# Patient Record
Sex: Female | Born: 1935 | Race: White | Hispanic: No | Smoking: Former smoker
Health system: Southern US, Community
[De-identification: ages and names within clinical notes are randomized; demographics above are authoritative.]

## PROBLEM LIST (undated history)

## (undated) ENCOUNTER — Inpatient Hospital Stay: Admission: EM | Payer: Self-pay | Source: Home / Self Care

## (undated) DIAGNOSIS — Z9289 Personal history of other medical treatment: Secondary | ICD-10-CM

## (undated) DIAGNOSIS — R1030 Lower abdominal pain, unspecified: Secondary | ICD-10-CM

## (undated) DIAGNOSIS — R569 Unspecified convulsions: Secondary | ICD-10-CM

## (undated) DIAGNOSIS — M818 Other osteoporosis without current pathological fracture: Secondary | ICD-10-CM

## (undated) DIAGNOSIS — M1990 Unspecified osteoarthritis, unspecified site: Secondary | ICD-10-CM

## (undated) DIAGNOSIS — I639 Cerebral infarction, unspecified: Secondary | ICD-10-CM

## (undated) DIAGNOSIS — K644 Residual hemorrhoidal skin tags: Secondary | ICD-10-CM

## (undated) DIAGNOSIS — I82409 Acute embolism and thrombosis of unspecified deep veins of unspecified lower extremity: Secondary | ICD-10-CM

## (undated) DIAGNOSIS — R609 Edema, unspecified: Secondary | ICD-10-CM

## (undated) DIAGNOSIS — R600 Localized edema: Secondary | ICD-10-CM

## (undated) DIAGNOSIS — K5190 Ulcerative colitis, unspecified, without complications: Secondary | ICD-10-CM

## (undated) DIAGNOSIS — C50911 Malignant neoplasm of unspecified site of right female breast: Secondary | ICD-10-CM

## (undated) DIAGNOSIS — R195 Other fecal abnormalities: Secondary | ICD-10-CM

## (undated) DIAGNOSIS — C50912 Malignant neoplasm of unspecified site of left female breast: Secondary | ICD-10-CM

## (undated) HISTORY — DX: Ulcerative colitis, unspecified, without complications: K51.90

## (undated) HISTORY — DX: Unspecified convulsions: R56.9

## (undated) HISTORY — DX: Other osteoporosis without current pathological fracture: M81.8

## (undated) HISTORY — DX: Other fecal abnormalities: R19.5

## (undated) HISTORY — DX: Localized edema: R60.0

## (undated) HISTORY — PX: APPENDECTOMY: SHX54

## (undated) HISTORY — PX: TONSILLECTOMY: SUR1361

## (undated) HISTORY — PX: BREAST BIOPSY: SHX20

## (undated) HISTORY — DX: Residual hemorrhoidal skin tags: K64.4

## (undated) HISTORY — DX: Acute embolism and thrombosis of unspecified deep veins of unspecified lower extremity: I82.409

## (undated) HISTORY — DX: Lower abdominal pain, unspecified: R10.30

## (undated) HISTORY — PX: FRACTURE SURGERY: SHX138

## (undated) HISTORY — PX: CATARACT EXTRACTION W/ INTRAOCULAR LENS  IMPLANT, BILATERAL: SHX1307

## (undated) HISTORY — PX: WRIST FRACTURE SURGERY: SHX121

## (undated) HISTORY — DX: Edema, unspecified: R60.9

## (undated) HISTORY — PX: VENA CAVA FILTER PLACEMENT: SUR1032

---
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
## 1996-08-18 DIAGNOSIS — C50912 Malignant neoplasm of unspecified site of left female breast: Secondary | ICD-10-CM

## 1996-08-18 HISTORY — DX: Malignant neoplasm of unspecified site of left female breast: C50.912

## 1996-08-18 HISTORY — PX: BREAST LUMPECTOMY: SHX2

## 1997-12-26 ENCOUNTER — Encounter: Admission: RE | Admit: 1997-12-26 | Discharge: 1998-03-26 | Payer: Self-pay | Admitting: Family Medicine

## 2001-12-27 HISTORY — PX: COLONOSCOPY: SHX174

## 2002-09-27 ENCOUNTER — Encounter: Admission: RE | Admit: 2002-09-27 | Discharge: 2002-10-18 | Payer: Self-pay | Admitting: *Deleted

## 2003-02-23 ENCOUNTER — Encounter: Admission: RE | Admit: 2003-02-23 | Discharge: 2003-03-03 | Payer: Self-pay | Admitting: *Deleted

## 2005-03-31 ENCOUNTER — Ambulatory Visit: Payer: Self-pay | Admitting: Internal Medicine

## 2005-05-09 ENCOUNTER — Ambulatory Visit (HOSPITAL_COMMUNITY): Admission: RE | Admit: 2005-05-09 | Discharge: 2005-05-09 | Payer: Self-pay | Admitting: Internal Medicine

## 2005-05-09 ENCOUNTER — Ambulatory Visit: Payer: Self-pay | Admitting: Internal Medicine

## 2005-05-09 ENCOUNTER — Encounter (INDEPENDENT_AMBULATORY_CARE_PROVIDER_SITE_OTHER): Payer: Self-pay | Admitting: Internal Medicine

## 2005-05-09 HISTORY — PX: COLONOSCOPY: SHX174

## 2006-07-15 ENCOUNTER — Ambulatory Visit: Payer: Self-pay | Admitting: Internal Medicine

## 2006-09-17 ENCOUNTER — Ambulatory Visit: Payer: Self-pay | Admitting: Internal Medicine

## 2006-10-09 ENCOUNTER — Ambulatory Visit: Payer: Self-pay | Admitting: Internal Medicine

## 2007-08-19 DIAGNOSIS — C50911 Malignant neoplasm of unspecified site of right female breast: Secondary | ICD-10-CM

## 2007-08-19 HISTORY — DX: Malignant neoplasm of unspecified site of right female breast: C50.911

## 2007-08-19 HISTORY — PX: BREAST BIOPSY: SHX20

## 2007-08-19 HISTORY — PX: MASTECTOMY: SHX3

## 2008-04-13 ENCOUNTER — Encounter: Admission: RE | Admit: 2008-04-13 | Discharge: 2008-04-13 | Payer: Self-pay | Admitting: Hematology and Oncology

## 2008-10-06 HISTORY — PX: COLONOSCOPY: SHX174

## 2010-10-24 ENCOUNTER — Ambulatory Visit (INDEPENDENT_AMBULATORY_CARE_PROVIDER_SITE_OTHER): Payer: Medicare Other | Admitting: Internal Medicine

## 2010-10-24 DIAGNOSIS — K5190 Ulcerative colitis, unspecified, without complications: Secondary | ICD-10-CM

## 2010-10-24 LAB — CBC AND DIFFERENTIAL
HCT: 41 % (ref 36–46)
Hemoglobin: 13.6 g/dL (ref 12.0–16.0)
Platelets: 238 10*3/uL (ref 150–399)

## 2010-12-11 HISTORY — PX: OTHER SURGICAL HISTORY: SHX169

## 2011-01-03 NOTE — Op Note (Signed)
NAME:  Monique Day, Monique Day NO.:  [id number]   MEDICAL RECORD NO.:  [id number]          PATIENT TYPE:  AMB   LOCATION:  DAY                           FACILITY:  [facility]   PHYSICIAN:  Hildred Laser, M.D.    DATE OF BIRTH:  02-26-1936   DATE OF PROCEDURE:  05/09/2005  DATE OF DISCHARGE:                                 OPERATIVE REPORT   PROCEDURE:  Surveillance colonoscopy.   INDICATION:  Monique Day is a 75 year old Caucasian female with over 40-year  history of ulcerative colitis who remains in remission. She is here for  surveillance examination. The procedure risks were reviewed with the  patient, and informed consent was obtained.   PREMEDICATION:  Demerol 25 mg IV, Versed 3 mg IV.   FINDINGS:  Procedure performed in endoscopy suite. The patient's vital signs  and O2 saturation were monitored during the procedure and remained stable.  The patient was placed in left lateral position and rectal examination  performed. No abnormality noted on external or digital exam. Olympus  videoscope was placed in rectum and advanced under vision into sigmoid colon  and beyond. There was extensive scarring involving the sigmoid colon and  less so proximally. There was noncritical narrowing with few tiny  pseudopolyps at mid sigmoid colon. This was felt to be noncritical. Few tiny  polyps which appeared to be pseudopolyps. Preparation was excellent. Scope  was passed to cecum which was identified by appendiceal orifice and  ileocecal valve. Pictures taken for the record. As the scope was withdrawn,  colonic mucosa was examined for the second time, and multiple biopsies were  taken from the right colon, transverse colon, left colon (descending and  sigmoid) and rectum. No mass or ulceration was noted. Scope was retroflexed  to examine anorectal junction, and prominent papilla was noted. Endoscope  was straightened and withdrawn. The patient tolerated the procedure well.   FINAL  DIAGNOSIS:  1.  Ulcerative colitis remains in remission. Extensive scarring noted.  2.  Noncritical narrowing at midsigmoid colon.  3.  Prominent anal papilla.   RECOMMENDATIONS:  She will resume her usual medications including Coumadin  today. I will be contacting the patient with biopsy results. Presuming  biopsies are negative for dysplasia, she will return for repeat exam in  three years from now.      Hildred Laser, M.D.  Electronically Signed     NR/MEDQ  D:  05/09/2005  T:  05/09/2005  Job:  [id number]   cc:   Octavio Graves  Fax: [phone]

## 2011-02-21 ENCOUNTER — Ambulatory Visit (HOSPITAL_COMMUNITY)
Admission: RE | Admit: 2011-02-21 | Discharge: 2011-02-21 | Disposition: A | Discharge status: Home / Self Care | Payer: Medicare Other | Source: Ambulatory Visit | Attending: Internal Medicine | Admitting: Internal Medicine

## 2011-02-21 ENCOUNTER — Other Ambulatory Visit (INDEPENDENT_AMBULATORY_CARE_PROVIDER_SITE_OTHER): Payer: Self-pay | Admitting: Internal Medicine

## 2011-02-21 ENCOUNTER — Encounter (HOSPITAL_BASED_OUTPATIENT_CLINIC_OR_DEPARTMENT_OTHER): Payer: Medicare Other | Admitting: Internal Medicine

## 2011-02-21 DIAGNOSIS — K5120 Ulcerative (chronic) proctitis without complications: Secondary | ICD-10-CM

## 2011-02-21 DIAGNOSIS — K5190 Ulcerative colitis, unspecified, without complications: Secondary | ICD-10-CM

## 2011-02-21 DIAGNOSIS — K921 Melena: Secondary | ICD-10-CM | POA: Insufficient documentation

## 2011-02-21 DIAGNOSIS — K625 Hemorrhage of anus and rectum: Secondary | ICD-10-CM

## 2011-02-21 DIAGNOSIS — R197 Diarrhea, unspecified: Secondary | ICD-10-CM | POA: Insufficient documentation

## 2011-02-21 DIAGNOSIS — Z7901 Long term (current) use of anticoagulants: Secondary | ICD-10-CM | POA: Insufficient documentation

## 2011-02-21 HISTORY — PX: COLONOSCOPY: SHX174

## 2011-02-21 LAB — CLOSTRIDIUM DIFFICILE BY PCR: Toxigenic C. Difficile by PCR: NEGATIVE

## 2011-02-24 LAB — OVA AND PARASITE EXAMINATION: Ova and parasites: NONE SEEN

## 2011-02-25 LAB — STOOL CULTURE

## 2011-03-10 NOTE — Op Note (Signed)
NAME:  Monique Day, Monique Day NO.:  [id number]  MEDICAL RECORD NO.:  [id number]  LOCATION:  DAYP                          FACILITY:  [facility]  PHYSICIAN:  Hildred Laser, M.D.    DATE OF BIRTH:  1936-05-14  DATE OF PROCEDURE:  02/21/2011 DATE OF DISCHARGE:                              OPERATIVE REPORT   PROCEDURE:  Colonoscopy.  [name] is a 75 year old Caucasian female with 50-year history of ulcerative colitis, whose last colonoscopy was 2 years ago who has been maintained on mesalamine and has done well until few weeks ago when she developed bloody diarrhea.  She is now feeling better.  She states she has been under stress, but did not take any NSAIDs.  She is chronically anticoagulated which unfortunately increased his ferritin for bleeding. Procedure risks were reviewed with the patient and informed consent was obtained.  MEDICATIONS FOR CONSCIOUS SEDATION: 1. Demerol 50 mg IV. 2. Versed 5 mg IV in divided dose.  FINDINGS:  Procedure performed in endoscopy suite.  The patient's vital signs and O2 sats were monitored during the procedure and remained stable.  The patient was placed in left lateral recumbent position and rectal examination performed.  No abnormality noted on external or digital exam.  Pentax videoscope was placed through rectum, where there was patchy edema, erythema and erosions.  The scope was passed into sigmoid colon, where these changes are more pronounced.  In some areas, there were circumferential involvement of mucosa with erosions or ulcers, edema, erythema and friability.  No obvious mass or stricture was noted.  Less pronounced changes were noted involving the descending transverse colon and hepatic flexure.  Mucosa of the cecum and ascending colon was spared.  Short segment of GI was also examined and was normal. On the way out, stool sample was taken and sent to the lab for C. diff by PCR cultures.  Biopsies were taken from hepatic  flexure, proximal descending colon, rectal mucosa and submitted in separate containers. While in the rectum, scope was retroflexed to examine anorectal junction and she had single small skin tag.  Endoscope was straightened and withdrawn.  Withdrawal time was 17 minutes.  The patient tolerated the procedure well.  FINAL DIAGNOSES: 1. Normal terminal ileum. 2. Active ulcerative colitis with post pronounced changes in the     sigmoid colon and sparing of ascending colon and cecum. 3. Biopsies taken from 3 different areas. 4. Stool sample also sent for routine studies.  RECOMMENDATIONS: 1. Standard instructions given.  She will resume her Coumadin later     today. 2. We will await for results of stool studies and biopsy before     further recommendations made.  If stool studies are negative and     biopsy shows typical changes of active UC, we will treat her with     prednisone for 4 weeks or so.          ______________________________ Hildred Laser, M.D.     NR/MEDQ  D:  02/21/2011  T:  02/21/2011  Job:  [id number]  cc:   Deeann Saint, M.D. Fax: [phone]  Octavio Graves, MD Fax: [phone]  Electronically Signed by Hildred Laser M.D. on 03/10/2011  12:11:49 AM

## 2011-03-13 ENCOUNTER — Other Ambulatory Visit (INDEPENDENT_AMBULATORY_CARE_PROVIDER_SITE_OTHER): Payer: Self-pay | Admitting: *Deleted

## 2011-03-13 MED ORDER — MESALAMINE 400 MG PO TBEC: DELAYED_RELEASE_TABLET | ORAL | Status: DC

## 2011-03-27 ENCOUNTER — Encounter (INDEPENDENT_AMBULATORY_CARE_PROVIDER_SITE_OTHER): Payer: Self-pay

## 2011-05-06 ENCOUNTER — Ambulatory Visit (INDEPENDENT_AMBULATORY_CARE_PROVIDER_SITE_OTHER): Payer: Medicare Other | Admitting: Internal Medicine

## 2011-05-06 ENCOUNTER — Encounter (INDEPENDENT_AMBULATORY_CARE_PROVIDER_SITE_OTHER): Payer: Self-pay | Admitting: Internal Medicine

## 2011-05-06 VITALS — BP 112/70 | HR 68 | Temp 97.0°F | Resp 12 | Ht 63.0 in | Wt 165.0 lb

## 2011-05-06 DIAGNOSIS — K5190 Ulcerative colitis, unspecified, without complications: Secondary | ICD-10-CM

## 2011-05-06 NOTE — Patient Instructions (Signed)
No changes made to Asacol today. Call if symptoms relapse.

## 2011-05-06 NOTE — Progress Notes (Signed)
Presenting complaint; followup for ulcerative colitis. Subjective; Monique Day is in for scheduled visit. She has over a 50 year history of ulcerative colitis. He is chronically entered coagulated for history of DVT. She developed rectal bleeding and diarrhea. She had colonoscopy in July which revealed active disease with patchy involvement. Biopsies revealed active colitis without dysplasia. Stool culture and O&P were negative. Her mesalamine dose was increased she was also treated with prednisone. She took last dose of prednisone 3 weeks ago and has not had any problems. She is having one formed stool daily. She says her stools have not been like this for least 2 years. She denies abdominal pain. She doesn't she had a bone density study in April and her score was better than it had been before. Current medications; Current Outpatient Prescriptions on File Prior to Visit  Medication Sig Dispense Refill  . Calcium Carbonate-Vit D-Min (CALCIUM 1200 PO) Take by mouth.        . mesalamine (ASACOL) 400 MG EC tablet TakeFive Tablets Twice Daily  300 tablet  2  . multivitamin (THERAGRAN) per tablet Take 1 tablet by mouth daily.        Marland Kitchen Specialty Vitamins Products (MAGNESIUM, AMINO ACID CHELATE,) 133 MG tablet Take 1 tablet by mouth 2 (two) times daily.        Marland Kitchen warfarin (COUMADIN) 5 MG tablet Take 5 mg by mouth as directed.        objective; BP 112/70  Pulse 68  Temp(Src) 97 F (36.1 C) (Oral)  Resp 12  Ht 5' 3"  (1.6 m)  Wt 165 lb (74.844 kg)  BMI 29.23 kg/m2 Conjunctiva is pink; sclerae nonicteric No neck masses or thyromegaly noted to Abdomen is full but soft and nontender without organomegaly or masses. No peripheral edema or clubbing noted Assessment; Chronic ulcerative colitis with recent relapse documented with a colonoscopy. She has been off prednisone for 3 weeks and doing well. Plan; Continue  Asacol at 4 g twice daily. Unless has problems she'll return for office visit in one  year. Next colonoscopy in July 2017.

## 2011-10-16 ENCOUNTER — Encounter (INDEPENDENT_AMBULATORY_CARE_PROVIDER_SITE_OTHER): Payer: Self-pay | Admitting: *Deleted

## 2011-11-05 ENCOUNTER — Ambulatory Visit (INDEPENDENT_AMBULATORY_CARE_PROVIDER_SITE_OTHER): Payer: Medicare Other | Admitting: Internal Medicine

## 2011-11-05 ENCOUNTER — Encounter (INDEPENDENT_AMBULATORY_CARE_PROVIDER_SITE_OTHER): Payer: Self-pay | Admitting: Internal Medicine

## 2011-11-05 VITALS — BP 92/52 | HR 72 | Temp 98.0°F | Ht 63.0 in | Wt 164.2 lb

## 2011-11-05 DIAGNOSIS — K5190 Ulcerative colitis, unspecified, without complications: Secondary | ICD-10-CM

## 2011-11-05 DIAGNOSIS — I82409 Acute embolism and thrombosis of unspecified deep veins of unspecified lower extremity: Secondary | ICD-10-CM | POA: Insufficient documentation

## 2011-11-05 DIAGNOSIS — K5120 Ulcerative (chronic) proctitis without complications: Secondary | ICD-10-CM

## 2011-11-05 NOTE — Progress Notes (Signed)
Subjective:     Patient ID: Monique Day, female   DOB: 05/13/1936, 76 y.o.   MRN: [id number]  HPIBetty is a 76 yr old female here today for f/u of her UC. She was last seen in September of 2012. She has over a 50 yr hx of UC. She is presently of warfarin for hx of DVT.  She had a colonoscopy in July 2012  which revealed active disease with patchy involvement.  Biopsies revealed active colitis with dysplasia. Stool culture and O and P were negative.  She tells me she is doing well.  She is having one BM a day. No rectal bleeding or melena. No abdominal pain. Appetite is good. No weight loss.  She is 100% feeling better. She is exercising.    Review of Systems see hpi Current Outpatient Prescriptions  Medication Sig Dispense Refill  . Calcium Carbonate-Vit D-Min (CALCIUM 1200 PO) Take by mouth.        . Cholecalciferol (VITAMIN D3) 2000 UNITS TABS Take by mouth. Take 1 every morning and 1 at night        . co-enzyme Q-10 30 MG capsule Take 100 mg by mouth daily.        . fish oil-omega-3 fatty acids 1000 MG capsule Take 1 g by mouth daily.        . mesalamine (ASACOL) 400 MG EC tablet TakeFive Tablets Twice Daily  300 tablet  2  . multivitamin (THERAGRAN) per tablet Take 1 tablet by mouth daily.        Marland Kitchen warfarin (COUMADIN) 5 MG tablet Take 5 mg by mouth as directed.       . Zoledronic Acid (ZOMETA IV) Inject into the vein. Patient is infused yearly       . Specialty Vitamins Products (MAGNESIUM, AMINO ACID CHELATE,) 133 MG tablet Take 1 tablet by mouth 2 (two) times daily.         Past Medical History  Diagnosis Date  . UC (ulcerative colitis)   . Lower abdominal pain   . Occult blood in stools   . Hemorrhoids, external   . HX: breast cancer   . DVT (deep venous thrombosis)   . Seizures    Past Surgical History  Procedure Date  . Breast lumpectomy   . Colonoscopy 02/21/2011  . Colonoscopy 10/06/2008  . Colonoscopy 12/27/01  . Colonoscopy 05/09/05  . Bone density 12/11/10    History   Social History  . Marital Status: Widowed    Spouse Name: N/A    Number of Children: N/A  . Years of Education: N/A   Occupational History  . Not on file.   Social History Main Topics  . Smoking status: Former Smoker    Types: Cigarettes    Quit date: 05/05/2001  . Smokeless tobacco: Never Used  . Alcohol Use: No  . Drug Use: No  . Sexually Active: Not on file   Other Topics Concern  . Not on file   Social History Narrative  . No narrative on file   Family Status  Relation Status Death Age  . Mother Deceased 56    external cancer  . Father Deceased 61   Allergies  Allergen Reactions  . Sulfa Antibiotics         Objective:   Physical Exam Filed Vitals:   11/05/11 1541  Height: 5' 3"  (1.6 m)  Weight: 164 lb 3.2 oz (74.481 kg)   Alert and oriented. Skin warm and dry. Oral mucosa is  moist.   . Sclera anicteric, conjunctivae is pink. Thyroid not enlarged. No cervical lymphadenopathy. Lungs clear. Heart regular rate and rhythm.  Abdomen is soft. Bowel sounds are positive. No hepatomegaly. No abdominal masses felt. No tenderness.  No edema to lower extremities. Patient is alert and oriented.     Assessment:    [place] which appears to be in remission     Plan:   OV in 1 yr. CBC and sed rate today.

## 2011-11-05 NOTE — Patient Instructions (Signed)
CBC and Sed rate. F/u one year

## 2011-11-06 LAB — CBC WITH DIFFERENTIAL/PLATELET
Basophils Absolute: 0 10*3/uL (ref 0.0–0.1)
Basophils Relative: 0 % (ref 0–1)
Eosinophils Absolute: 0.2 10*3/uL (ref 0.0–0.7)
Eosinophils Relative: 3 % (ref 0–5)
HCT: 41.6 % (ref 36.0–46.0)
Hemoglobin: 13.7 g/dL (ref 12.0–15.0)
Lymphocytes Relative: 45 % (ref 12–46)
Lymphs Abs: 2.5 10*3/uL (ref 0.7–4.0)
MCH: 30.8 pg (ref 26.0–34.0)
MCHC: 32.9 g/dL (ref 30.0–36.0)
MCV: 93.5 fL (ref 78.0–100.0)
Monocytes Absolute: 0.4 10*3/uL (ref 0.1–1.0)
Monocytes Relative: 8 % (ref 3–12)
Neutro Abs: 2.4 10*3/uL (ref 1.7–7.7)
Neutrophils Relative %: 44 % (ref 43–77)
Platelets: 202 10*3/uL (ref 150–400)
RBC: 4.45 MIL/uL (ref 3.87–5.11)
RDW: 13 % (ref 11.5–15.5)
WBC: 5.4 10*3/uL (ref 4.0–10.5)

## 2011-11-06 LAB — SEDIMENTATION RATE: Sed Rate: 4 mm/hr (ref 0–22)

## 2012-01-08 ENCOUNTER — Encounter: Payer: Medicare Other | Admitting: Hematology and Oncology

## 2012-01-08 DIAGNOSIS — Z8632 Personal history of gestational diabetes: Secondary | ICD-10-CM

## 2012-01-08 DIAGNOSIS — C50919 Malignant neoplasm of unspecified site of unspecified female breast: Secondary | ICD-10-CM

## 2012-01-08 DIAGNOSIS — K5190 Ulcerative colitis, unspecified, without complications: Secondary | ICD-10-CM

## 2012-01-08 DIAGNOSIS — M810 Age-related osteoporosis without current pathological fracture: Secondary | ICD-10-CM

## 2012-01-15 DIAGNOSIS — M949 Disorder of cartilage, unspecified: Secondary | ICD-10-CM

## 2012-01-15 DIAGNOSIS — Z86718 Personal history of other venous thrombosis and embolism: Secondary | ICD-10-CM

## 2012-01-15 DIAGNOSIS — Z7901 Long term (current) use of anticoagulants: Secondary | ICD-10-CM

## 2012-01-15 DIAGNOSIS — C50919 Malignant neoplasm of unspecified site of unspecified female breast: Secondary | ICD-10-CM

## 2012-01-15 DIAGNOSIS — M899 Disorder of bone, unspecified: Secondary | ICD-10-CM

## 2012-04-22 ENCOUNTER — Telehealth (INDEPENDENT_AMBULATORY_CARE_PROVIDER_SITE_OTHER): Payer: Self-pay | Admitting: Internal Medicine

## 2012-04-22 NOTE — Telephone Encounter (Signed)
C/o lower abdominal pain, flecks of blood in her stools. Thinks she is having a UC flare.  Prednisone 42m x 1 week, then 162mx 1 week, then 1049m 1 week, then 5mg70m1 week, then OV.   DonnButch Pennyeds OV in 1 month.   [phone]

## 2012-04-22 NOTE — Telephone Encounter (Signed)
Apt has been scheduled for 06/08/12 at 10:45 am with Deberah Castle, NP.

## 2012-06-07 ENCOUNTER — Inpatient Hospital Stay (HOSPITAL_COMMUNITY)
Admission: EM | Admit: 2012-06-07 | Discharge: 2012-06-10 | DRG: 386 | Disposition: A | Discharge status: Home / Self Care | Payer: Medicare Other | Attending: Internal Medicine | Admitting: Internal Medicine

## 2012-06-07 ENCOUNTER — Encounter (HOSPITAL_COMMUNITY): Payer: Self-pay | Admitting: Emergency Medicine

## 2012-06-07 ENCOUNTER — Emergency Department (HOSPITAL_COMMUNITY): Payer: Medicare Other

## 2012-06-07 DIAGNOSIS — D62 Acute posthemorrhagic anemia: Secondary | ICD-10-CM

## 2012-06-07 DIAGNOSIS — Z882 Allergy status to sulfonamides status: Secondary | ICD-10-CM

## 2012-06-07 DIAGNOSIS — D6832 Hemorrhagic disorder due to extrinsic circulating anticoagulants: Secondary | ICD-10-CM

## 2012-06-07 DIAGNOSIS — Z86718 Personal history of other venous thrombosis and embolism: Secondary | ICD-10-CM

## 2012-06-07 DIAGNOSIS — Z7901 Long term (current) use of anticoagulants: Secondary | ICD-10-CM

## 2012-06-07 DIAGNOSIS — E669 Obesity, unspecified: Secondary | ICD-10-CM | POA: Diagnosis present

## 2012-06-07 DIAGNOSIS — K921 Melena: Secondary | ICD-10-CM

## 2012-06-07 DIAGNOSIS — Z9010 Acquired absence of unspecified breast and nipple: Secondary | ICD-10-CM

## 2012-06-07 DIAGNOSIS — E878 Other disorders of electrolyte and fluid balance, not elsewhere classified: Secondary | ICD-10-CM

## 2012-06-07 DIAGNOSIS — I82409 Acute embolism and thrombosis of unspecified deep veins of unspecified lower extremity: Secondary | ICD-10-CM

## 2012-06-07 DIAGNOSIS — Z6826 Body mass index (BMI) 26.0-26.9, adult: Secondary | ICD-10-CM

## 2012-06-07 DIAGNOSIS — Z853 Personal history of malignant neoplasm of breast: Secondary | ICD-10-CM

## 2012-06-07 DIAGNOSIS — T45515A Adverse effect of anticoagulants, initial encounter: Secondary | ICD-10-CM | POA: Diagnosis present

## 2012-06-07 DIAGNOSIS — K5289 Other specified noninfective gastroenteritis and colitis: Secondary | ICD-10-CM

## 2012-06-07 DIAGNOSIS — K5150 Left sided colitis without complications: Principal | ICD-10-CM | POA: Diagnosis present

## 2012-06-07 DIAGNOSIS — M899 Disorder of bone, unspecified: Secondary | ICD-10-CM | POA: Diagnosis present

## 2012-06-07 DIAGNOSIS — Z8673 Personal history of transient ischemic attack (TIA), and cerebral infarction without residual deficits: Secondary | ICD-10-CM

## 2012-06-07 DIAGNOSIS — Z23 Encounter for immunization: Secondary | ICD-10-CM

## 2012-06-07 DIAGNOSIS — Z87891 Personal history of nicotine dependence: Secondary | ICD-10-CM

## 2012-06-07 DIAGNOSIS — Z79899 Other long term (current) drug therapy: Secondary | ICD-10-CM

## 2012-06-07 DIAGNOSIS — K529 Noninfective gastroenteritis and colitis, unspecified: Secondary | ICD-10-CM

## 2012-06-07 DIAGNOSIS — K5120 Ulcerative (chronic) proctitis without complications: Secondary | ICD-10-CM

## 2012-06-07 DIAGNOSIS — Z9221 Personal history of antineoplastic chemotherapy: Secondary | ICD-10-CM

## 2012-06-07 DIAGNOSIS — IMO0002 Reserved for concepts with insufficient information to code with codable children: Secondary | ICD-10-CM

## 2012-06-07 DIAGNOSIS — D649 Anemia, unspecified: Secondary | ICD-10-CM

## 2012-06-07 DIAGNOSIS — E860 Dehydration: Secondary | ICD-10-CM | POA: Diagnosis present

## 2012-06-07 DIAGNOSIS — E871 Hypo-osmolality and hyponatremia: Secondary | ICD-10-CM

## 2012-06-07 DIAGNOSIS — R609 Edema, unspecified: Secondary | ICD-10-CM | POA: Diagnosis present

## 2012-06-07 HISTORY — DX: Cerebral infarction, unspecified: I63.9

## 2012-06-07 LAB — COMPREHENSIVE METABOLIC PANEL
ALT: 15 U/L (ref 0–35)
AST: 13 U/L (ref 0–37)
Albumin: 2.6 g/dL — ABNORMAL LOW (ref 3.5–5.2)
Alkaline Phosphatase: 34 U/L — ABNORMAL LOW (ref 39–117)
BUN: 19 mg/dL (ref 6–23)
CO2: 27 mEq/L (ref 19–32)
Calcium: 7.8 mg/dL — ABNORMAL LOW (ref 8.4–10.5)
Chloride: 95 mEq/L — ABNORMAL LOW (ref 96–112)
Creatinine, Ser: 0.87 mg/dL (ref 0.50–1.10)
GFR calc Af Amer: 74 mL/min — ABNORMAL LOW (ref >90)
GFR calc non Af Amer: 64 mL/min — ABNORMAL LOW (ref >90)
Glucose, Bld: 122 mg/dL — ABNORMAL HIGH (ref 70–99)
Potassium: 4.1 mEq/L (ref 3.5–5.1)
Sodium: 131 mEq/L — ABNORMAL LOW (ref 135–145)
Total Bilirubin: 0.4 mg/dL (ref 0.3–1.2)
Total Protein: 6.1 g/dL (ref 6.0–8.3)

## 2012-06-07 LAB — CBC WITH DIFFERENTIAL/PLATELET
Basophils Absolute: 0 10*3/uL (ref 0.0–0.1)
Basophils Relative: 0 % (ref 0–1)
Eosinophils Absolute: 0 10*3/uL (ref 0.0–0.7)
Eosinophils Relative: 0 % (ref 0–5)
HCT: 34.5 % — ABNORMAL LOW (ref 36.0–46.0)
Hemoglobin: 11.6 g/dL — ABNORMAL LOW (ref 12.0–15.0)
Lymphocytes Relative: 8 % — ABNORMAL LOW (ref 12–46)
Lymphs Abs: 0.6 10*3/uL — ABNORMAL LOW (ref 0.7–4.0)
MCH: 30.9 pg (ref 26.0–34.0)
MCHC: 33.6 g/dL (ref 30.0–36.0)
MCV: 91.8 fL (ref 78.0–100.0)
Monocytes Absolute: 1.1 10*3/uL — ABNORMAL HIGH (ref 0.1–1.0)
Monocytes Relative: 13 % — ABNORMAL HIGH (ref 3–12)
Neutro Abs: 6.7 10*3/uL (ref 1.7–7.7)
Neutrophils Relative %: 79 % — ABNORMAL HIGH (ref 43–77)
Platelets: 408 10*3/uL — ABNORMAL HIGH (ref 150–400)
RBC: 3.76 MIL/uL — ABNORMAL LOW (ref 3.87–5.11)
RDW: 13.1 % (ref 11.5–15.5)
WBC: 8.4 10*3/uL (ref 4.0–10.5)

## 2012-06-07 LAB — URINALYSIS, ROUTINE W REFLEX MICROSCOPIC
Glucose, UA: NEGATIVE mg/dL
Leukocytes, UA: NEGATIVE
Nitrite: NEGATIVE
Protein, ur: NEGATIVE mg/dL
Specific Gravity, Urine: >1.03 — ABNORMAL HIGH (ref 1.005–1.030)
Urobilinogen, UA: 0.2 mg/dL (ref 0.0–1.0)
pH: 5.5 (ref 5.0–8.0)

## 2012-06-07 LAB — PROTIME-INR
INR: 2.64 — ABNORMAL HIGH (ref 0.00–1.49)
Prothrombin Time: 26.9 seconds — ABNORMAL HIGH (ref 11.6–15.2)

## 2012-06-07 LAB — URINE MICROSCOPIC-ADD ON

## 2012-06-07 LAB — APTT: aPTT: 38 seconds — ABNORMAL HIGH (ref 24–37)

## 2012-06-07 MED ORDER — INFLUENZA VIRUS VACC SPLIT PF IM SUSP
0.5000 mL | INTRAMUSCULAR | Status: AC
  Filled 2012-06-07: qty 0.5

## 2012-06-07 MED ORDER — CIPROFLOXACIN IN D5W 400 MG/200ML IV SOLN
INTRAVENOUS | Status: AC
  Filled 2012-06-07: qty 400

## 2012-06-07 MED ORDER — HYDROCODONE-ACETAMINOPHEN 5-325 MG PO TABS
1.0000 | ORAL_TABLET | ORAL | Status: DC | PRN
  Administered 2012-06-10: 1 via ORAL
  Filled 2012-06-07: qty 1

## 2012-06-07 MED ORDER — ONDANSETRON HCL 4 MG/2ML IJ SOLN: 4.0000 mg | Freq: Three times a day (TID) | INTRAMUSCULAR | Status: DC | PRN

## 2012-06-07 MED ORDER — HYDROCODONE-ACETAMINOPHEN 5-325 MG PO TABS: 1.0000 | ORAL_TABLET | Freq: Four times a day (QID) | ORAL | Status: DC | PRN

## 2012-06-07 MED ORDER — SODIUM CHLORIDE 0.9 % IV SOLN: INTRAVENOUS | Status: DC

## 2012-06-07 MED ORDER — ONDANSETRON HCL 4 MG PO TABS: 4.0000 mg | ORAL_TABLET | Freq: Four times a day (QID) | ORAL | Status: DC | PRN

## 2012-06-07 MED ORDER — MESALAMINE 800 MG PO TBEC
1600.0000 mg | DELAYED_RELEASE_TABLET | Freq: Two times a day (BID) | ORAL | Status: DC
  Filled 2012-06-07 (×4): qty 2

## 2012-06-07 MED ORDER — METHYLPREDNISOLONE SODIUM SUCC 125 MG IJ SOLR
60.0000 mg | Freq: Two times a day (BID) | INTRAMUSCULAR | Status: DC
  Administered 2012-06-07 – 2012-06-09 (×4): 60 mg via INTRAVENOUS
  Filled 2012-06-07 (×4): qty 2

## 2012-06-07 MED ORDER — METRONIDAZOLE IN NACL 5-0.79 MG/ML-% IV SOLN
INTRAVENOUS | Status: AC
  Filled 2012-06-07: qty 200

## 2012-06-07 MED ORDER — POTASSIUM CHLORIDE IN NACL 20-0.9 MEQ/L-% IV SOLN
INTRAVENOUS | Status: DC
  Administered 2012-06-07 – 2012-06-10 (×4): via INTRAVENOUS

## 2012-06-07 MED ORDER — ACETAMINOPHEN 650 MG RE SUPP: 650.0000 mg | Freq: Four times a day (QID) | RECTAL | Status: DC | PRN

## 2012-06-07 MED ORDER — ONDANSETRON HCL 4 MG/2ML IJ SOLN
4.0000 mg | Freq: Once | INTRAMUSCULAR | Status: AC
  Administered 2012-06-07: 4 mg via INTRAVENOUS
  Filled 2012-06-07: qty 2

## 2012-06-07 MED ORDER — IOHEXOL 300 MG/ML  SOLN
100.0000 mL | Freq: Once | INTRAMUSCULAR | Status: AC | PRN
  Administered 2012-06-07: 100 mL via INTRAVENOUS

## 2012-06-07 MED ORDER — ONDANSETRON HCL 4 MG/2ML IJ SOLN: 4.0000 mg | Freq: Four times a day (QID) | INTRAMUSCULAR | Status: DC | PRN

## 2012-06-07 MED ORDER — SODIUM CHLORIDE 0.9 % IV BOLUS (SEPSIS)
700.0000 mL | Freq: Once | INTRAVENOUS | Status: AC
  Administered 2012-06-07: 700 mL via INTRAVENOUS

## 2012-06-07 MED ORDER — MESALAMINE 400 MG PO CPDR
DELAYED_RELEASE_CAPSULE | ORAL | Status: AC
  Filled 2012-06-07: qty 4

## 2012-06-07 MED ORDER — PANTOPRAZOLE SODIUM 40 MG PO TBEC
DELAYED_RELEASE_TABLET | ORAL | Status: AC
  Filled 2012-06-07: qty 1

## 2012-06-07 MED ORDER — SODIUM CHLORIDE 0.9 % IV SOLN
INTRAVENOUS | Status: DC
  Administered 2012-06-07 (×2): via INTRAVENOUS

## 2012-06-07 MED ORDER — WARFARIN - PHARMACIST DOSING INPATIENT: Freq: Every day | Status: DC

## 2012-06-07 MED ORDER — MORPHINE SULFATE 4 MG/ML IJ SOLN
4.0000 mg | Freq: Once | INTRAMUSCULAR | Status: AC
  Administered 2012-06-07: 4 mg via INTRAVENOUS
  Filled 2012-06-07 (×3): qty 1

## 2012-06-07 MED ORDER — PNEUMOCOCCAL VAC POLYVALENT 25 MCG/0.5ML IJ INJ
0.5000 mL | INJECTION | INTRAMUSCULAR | Status: AC
  Filled 2012-06-07: qty 0.5

## 2012-06-07 MED ORDER — ACETAMINOPHEN 325 MG PO TABS: 650.0000 mg | ORAL_TABLET | Freq: Four times a day (QID) | ORAL | Status: DC | PRN

## 2012-06-07 MED ORDER — MORPHINE SULFATE 2 MG/ML IJ SOLN: 2.0000 mg | INTRAMUSCULAR | Status: DC | PRN

## 2012-06-07 MED ORDER — WARFARIN SODIUM 5 MG PO TABS
5.0000 mg | ORAL_TABLET | Freq: Once | ORAL | Status: AC
  Administered 2012-06-07: 5 mg via ORAL
  Filled 2012-06-07: qty 1

## 2012-06-07 MED ORDER — METRONIDAZOLE IN NACL 5-0.79 MG/ML-% IV SOLN
500.0000 mg | Freq: Three times a day (TID) | INTRAVENOUS | Status: DC
  Administered 2012-06-07 – 2012-06-09 (×6): 500 mg via INTRAVENOUS
  Filled 2012-06-07 (×9): qty 100

## 2012-06-07 MED ORDER — CIPROFLOXACIN IN D5W 400 MG/200ML IV SOLN
400.0000 mg | Freq: Two times a day (BID) | INTRAVENOUS | Status: DC
  Administered 2012-06-07 – 2012-06-09 (×4): 400 mg via INTRAVENOUS
  Filled 2012-06-07 (×6): qty 200

## 2012-06-07 NOTE — ED Notes (Signed)
Per EMS pt has had generalized weakness x10 days. Pt reports that she started to have an ulcerative colitis flare-up in September went to Dr. Laural Golden and was px prednisone and ever since she has felt weak and shaky.  Pt denies any n/v within the last few hours.Pt reports that she has had blood in her stool since September and was seen in an ED in [place] while visiting family and was dx as dehydrated and the EDP there doubled her dose of prednisone to help with on-going ulcerative colitis flare-up.

## 2012-06-07 NOTE — ED Notes (Signed)
Pt states that she does not want morphine for pain. Pt states she doesn't feel she needs any pain medication at this time. MD aware.

## 2012-06-07 NOTE — H&P (Signed)
[facility] History and Physical  Monique Day OEU:[id number] DOB: [birth year] DOA: 06/07/2012  Referring physician: Dr. Tomi Bamberger PCP: Monique Graves, DO  Specialists: GI: Dr. Laural Golden  Chief Complaint: bloody stools  HPI: Monique Day is a 76 y.o. female with a history of ulcerative colitis that was diagnosed at the age of 61. She reports having flares approximately once every 2 years. She has been maintained on Asacol and reports doing fairly well with this. Since beginning September she has noticed that she's been having blood in her stools as well as diarrhea. She was placed on prednisone which initially provided some improvement in her symptoms, but every time the prednisone was tapered her symptoms would start to return. She visited family in [place] and required a emergency room visit over there. At that time her prednisone was increased which again provided some temporary improvement in her symptoms, but as the prednisone was tapered her symptoms return. She describes having 3-4 bowel movements during the day as well as 5-6 bowel movements at night. These are bloody bowel movements that are small in volume. She has had lower abdominal pain which is crampy. She has had nausea but no vomiting, mostly dry heaves. She has felt chills this morning but denies any fevers. She was evaluated in the emergency room with a CT scan which indicated an acute colitis. The patient has been referred for admission.  Review of Systems: Pertinent positives as per history of present illness, otherwise negative  Past Medical History  Diagnosis Date  . UC (ulcerative colitis)   . Lower abdominal pain   . Occult blood in stools   . Hemorrhoids, external   . HX: breast cancer   . DVT (deep venous thrombosis)   . Seizures   . Stroke    Past Surgical History  Procedure Date  . Breast lumpectomy   . Colonoscopy 02/21/2011  . Colonoscopy 10/06/2008  . Colonoscopy 12/27/01  . Colonoscopy 05/09/05    . Bone density 12/11/10   Social History:  reports that she quit smoking about 11 years ago. Her smoking use included Cigarettes. She has never used smokeless tobacco. She reports that she does not drink alcohol or use illicit drugs. She is independent with ADLs, lives at home  Allergies  Allergen Reactions  . Sulfa Antibiotics Other (See Comments)    Extreme Weakness    Family History  Problem Relation Age of Onset  . Prostate cancer Father    no inflammatory bowel disease in the family  Prior to Admission medications   Medication Sig Start Date End Date Taking? Authorizing Provider  Calcium Carb-Cholecalciferol (CALCIUM 1000 + D PO) Take 1-2 tablets by mouth daily. Takes one tablet in the morning and two tablets in the evening.   Yes Historical Provider, MD  Cholecalciferol (VITAMIN D3) 2000 UNITS TABS Take 4,000 Units by mouth daily. Take 1 every morning and 1 at night    Yes Historical Provider, MD  Coenzyme Q10 (CO Q 10) 100 MG CAPS Take 1 capsule by mouth daily.   Yes Historical Provider, MD  denosumab (PROLIA) 60 MG/ML SOLN injection Inject 60 mg into the skin every 6 (six) months. Administer in upper arm, thigh, or abdomen   Yes Historical Provider, MD  Mesalamine (ASACOL HD) 800 MG TBEC Take 1,600 mg by mouth 2 (two) times daily.   Yes Historical Provider, MD  Multiple Vitamin (MULTIVITAMIN WITH MINERALS) TABS Take 1 tablet by mouth daily.   Yes Historical Provider, MD  OMEGA  3 1200 MG CAPS Take 1 capsule by mouth daily.   Yes Historical Provider, MD  predniSONE (DELTASONE) 5 MG tablet Take 5 mg by mouth daily. Takes 4 tablets twice daily for 4 days, then take 3 tablets twice daily for 4 days, take 2 tablets twice daily for 4 days, take 1 tablet twice daily for 4 days, lastly take 4 tablets by mouth daily for 4 days.   Yes Historical Provider, MD  warfarin (COUMADIN) 5 MG tablet Take 2.5-5 mg by mouth as directed. Takes 5 mg on Mon, Wed, and Fri. All other days takes 2.5 mg    Yes Historical Provider, MD   Physical Exam: Filed Vitals:   06/07/12 1218 06/07/12 1411 06/07/12 1520 06/07/12 1643  BP: 111/60 86/49 111/64 103/66  Pulse: 81 85 87 91  Temp: 98.6 F (37 C) 98 F (36.7 C)  98.4 F (36.9 C)  TempSrc: Oral Oral  Oral  Resp: 14  16 18   Height:    5' 3"  (1.6 m)  Weight:    68.947 kg (152 lb)  SpO2: 97% 99% 100% 100%     General:  No acute distress  Eyes: Pupils are equal round react to light and accommodation  ENT: Mucous membranes are dry  Neck: Supple  Cardiovascular: S1, S2, regular rate and rhythm  Respiratory: Clear to auscultation bilaterally  Abdomen: Soft, mildly tender in the lower abdomen, bowel sounds are active  Skin: Normal  Musculoskeletal: Deferred  Psychiatric: Normal affect, cooperative with exam  Neurologic: Grossly intact, nonfocal  Labs on Admission:  Basic Metabolic Panel:  Lab 38/10/17 0924  NA 131*  K 4.1  CL 95*  CO2 27  GLUCOSE 122*  BUN 19  CREATININE 0.87  CALCIUM 7.8*  MG --  PHOS --   Liver Function Tests:  Lab 06/07/12 0924  AST 13  ALT 15  ALKPHOS 34*  BILITOT 0.4  PROT 6.1  ALBUMIN 2.6*   No results found for this basename: LIPASE:5,AMYLASE:5 in the last 168 hours No results found for this basename: AMMONIA:5 in the last 168 hours CBC:  Lab 06/07/12 0924  WBC 8.4  NEUTROABS 6.7  HGB 11.6*  HCT 34.5*  MCV 91.8  PLT 408*   Cardiac Enzymes: No results found for this basename: CKTOTAL:5,CKMB:5,CKMBINDEX:5,TROPONINI:5 in the last 168 hours  BNP (last 3 results) No results found for this basename: PROBNP:3 in the last 8760 hours CBG: No results found for this basename: GLUCAP:5 in the last 168 hours  Radiological Exams on Admission: Ct Abdomen Pelvis W Contrast  06/07/2012  *RADIOLOGY REPORT*  Clinical Data: Left lower quadrant pain.  Rectal bleeding.  Nausea. The ulcer colitis.  Personal history of bilateral breast carcinoma.  CT ABDOMEN AND PELVIS WITH CONTRAST   Technique:  Multidetector CT imaging of the abdomen and pelvis was performed following the standard protocol during bolus administration of intravenous contrast.  Contrast: 155m OMNIPAQUE IOHEXOL 300 MG/ML  SOLN  Comparison: None.  Findings: Mild colonic wall thickening and loss of normal haustral fold pattern is seen involving the splenic flexure, descending, and rectosigmoid colon.  This is consistent with the patient's history of ulcerative colitis.  There is no evidence of terminal ileum or other small bowel involvement.  No evidence of bowel obstruction. No evidence of abscess or free fluid.  Uterus and adnexa are unremarkable in appearance.  The abdominal parenchymal organs are normal in appearance.  Gallbladder is unremarkable.  No soft tissue masses or lymphadenopathy identified within the abdomen  or pelvis.  IMPRESSION:  1.  Mild colitis involving the splenic flexure, descending, and rectosigmoid colon, consistent with known history of ulcerative colitis. 2.  No evidence of complication or other significant abnormality.   Original Report Authenticated By: Marlaine Hind, M.D.      Assessment/Plan Active Problems:  UC (ulcerative colitis confined to rectum)  DVT (deep venous thrombosis)  Hyponatremia  Dehydration  Anemia   1. Ulcerative colitis flare. Patient was placed on intravenous steroids. We will also start her on intravenous antibiotics, check stool C. difficile, stool culture. We will ask for gastroenterology consultation with Dr. Laural Golden. We will continue her Asacol and the remainder of her outpatient medications. He'll be kept on clear liquids for now this can be advanced as tolerated. 2. History DVTs. Patient has been maintained on Coumadin chronically. We'll continue the same pharmacy to adjust. Her hemoglobin appears to be stable despite reports of bleeding. 3. Dehydration. Patient will receive IV fluids, monitor clinically  Code Status: Full code Family Communication: Discussed  with patient at the bedside Disposition Plan: Discharge home once medically improved  Time spent: 55 minutes  [facility] Pager [phone]  If 7PM-7AM, please contact night-coverage [email or web address] Password [facility] 06/07/2012, 6:15 PM

## 2012-06-07 NOTE — ED Provider Notes (Signed)
History    This chart was scribed for [name], MD, MD by Rhae Lerner. The patient was seen in room APA05 and the patient's care was started at 8:51AM.   CSN: [id number]  Arrival date & time 06/07/12  3818      Chief Complaint  Patient presents with  . Weakness  . Chills    (Consider location/radiation/quality/duration/timing/severity/associated sxs/prior treatment) Patient is a 76 y.o. female presenting with weakness. The history is provided by the patient. No language interpreter was used.  Weakness Primary symptoms do not include nausea or vomiting.  Additional symptoms include weakness.   Monique Day is a 76 y.o. female who presents to the Emergency Department BIB EMS complaining of constant, moderate chills onset this 3-6AM today. Pt reports having ulcerative colitis (diagnosed at 76 y.o) flare up that is has been constant since Sept. causing constant, moderate abdominal pain and watery/mucus diarrhea and blood in stool. She states that she has bowel movement constantly throughout the night about hourly. She reports that when she takes prednisone with minor relief (taken since 1.5 months). Pt reports that her prednisone was doubled from 20 to 40 10 days ago by EDP in [place], but is on downward taper now. Denies sore throat, rhinorrhea and cough. She has nausea and dry heaves, but no vomiting. Reports chills the prior two days without fever, but this am the chills were intense.   Pt reports that she has hx DVT, the she had chills when diagnosed with DVT. Denies pain or swelling in bilateral lower legs. States she did have some pain in her prox lateral left thigh yesterday but not today. Her INR in [place] was 2.6. Pt reports increased urination. She states that she feels weak due to diarrhea. Pt reports that she takes coumadin due to DVT.   PCP is Dr.  Ferd Hibbs in [place] is Dr. Laural Golden appointment tomorrow  Past Medical History  Diagnosis Date  . UC (ulcerative  colitis)   . Lower abdominal pain   . Occult blood in stools   . Hemorrhoids, external   . HX: breast cancer   . DVT (deep venous thrombosis)   . Seizures     Past Surgical History  Procedure Date  . Breast lumpectomy   . Colonoscopy 02/21/2011  . Colonoscopy 10/06/2008  . Colonoscopy 12/27/01  . Colonoscopy 05/09/05  . Bone density 12/11/10    Family History  Problem Relation Age of Onset  . Prostate cancer Father     History  Substance Use Topics  . Smoking status: Former Smoker    Types: Cigarettes    Quit date: 05/05/2001  . Smokeless tobacco: Never Used  . Alcohol Use: No  Lives at home  OB History    Grav Para Term Preterm Abortions TAB SAB Ect Mult Living                  Review of Systems  Constitutional: Positive for chills.  Gastrointestinal: Positive for abdominal pain, diarrhea and blood in stool. Negative for nausea and vomiting.  Neurological: Positive for weakness.  All other systems reviewed and are negative.    Allergies  Sulfa antibiotics  Home Medications   Current Outpatient Rx  Name Route Sig Dispense Refill  . CALCIUM 1200 PO Oral Take by mouth.      Marland Kitchen VITAMIN D3 2000 UNITS PO TABS Oral Take by mouth. Take 1 every morning and 1 at night      . COENZYME Q10  30 MG PO CAPS Oral Take 100 mg by mouth daily.      . OMEGA-3 FATTY ACIDS 1000 MG PO CAPS Oral Take 1 g by mouth daily.      Marland Kitchen MESALAMINE 400 MG PO TBEC  TakeFive Tablets Twice Daily 300 tablet 2  . MULTIVITAMINS PO TABS Oral Take 1 tablet by mouth daily.      . MG-PLUS PROTEIN 133 MG PO TABS Oral Take 1 tablet by mouth 2 (two) times daily.      . WARFARIN SODIUM 5 MG PO TABS Oral Take 5 mg by mouth as directed.     Marland Kitchen ZOMETA IV Intravenous Inject into the vein. Patient is infused yearly       BP 105/84  Pulse 115  Temp 98.6 F (37 C) (Oral)  Resp 20  SpO2 97%  Vital signs normal except tachycardia   Physical Exam  Nursing note and vitals reviewed. Constitutional:  She is oriented to person, place, and time. She appears well-developed and well-nourished.  Non-toxic appearance. She does not appear ill. No distress.       Pt appears pale   HENT:  Head: Normocephalic and atraumatic.  Right Ear: External ear normal.  Left Ear: External ear normal.  Nose: Nose normal. No mucosal edema or rhinorrhea.  Mouth/Throat: Mucous membranes are normal. No dental abscesses or uvula swelling.       Dry tongue, dry cracked lips  Eyes: Conjunctivae normal and EOM are normal. Pupils are equal, round, and reactive to light.  Neck: Normal range of motion and full passive range of motion without pain. Neck supple.  Cardiovascular: Normal rate, regular rhythm and normal heart sounds.  Exam reveals no gallop and no friction rub.   No murmur heard. Pulmonary/Chest: Effort normal and breath sounds normal. No respiratory distress. She has no wheezes. She has no rhonchi. She has no rales. She exhibits no tenderness and no crepitus.  Abdominal: Soft. Normal appearance and bowel sounds are normal. She exhibits no distension. There is tenderness in the left lower quadrant. There is no rebound and no guarding.    Musculoskeletal: Normal range of motion.        bilateral legs nontender  No swelling of bilateral legs   Neurological: She is alert and oriented to person, place, and time. She has normal strength. No cranial nerve deficit.  Skin: Skin is warm, dry and intact. No rash noted. No erythema. There is pallor.  Psychiatric: She has a normal mood and affect. Her speech is normal and behavior is normal. Her mood appears not anxious.    ED Course  Procedures (including critical care time) DIAGNOSTIC STUDIES: Oxygen Saturation is 97% on room air, normal by my interpretation.    COORDINATION OF CARE: 9:02 AM Discussed ED treatment with pt  9:02 AM Ordered:   Medications  0.9 %  sodium chloride infusion (not administered)  ondansetron (ZOFRAN) injection 4 mg (4 mg  Intravenous Given 06/07/12 0944)  sodium chloride 0.9 % bolus 700 mL (700 mL Intravenous New Bag/Given 06/07/12 0937)  iohexol (OMNIPAQUE) 300 MG/ML solution 100 mL (100 mL Intravenous Contrast Given 06/07/12 1105)   Pt refused IV morphine, wants oral meds.   11:41 AM Recheck: Discussed lab results and treatment course with pt. PT states she would prefer to be admitted. Pt will be admitted.    12:11 Dr Roderic Palau admit to med-surg, team 2   Results for orders placed during the hospital encounter of 06/07/12  CBC WITH DIFFERENTIAL  Component Value Range   WBC 8.4  4.0 - 10.5 K/uL   RBC 3.76 (*) 3.87 - 5.11 MIL/uL   Hemoglobin 11.6 (*) 12.0 - 15.0 g/dL   HCT 34.5 (*) 36.0 - 46.0 %   MCV 91.8  78.0 - 100.0 fL   MCH 30.9  26.0 - 34.0 pg   MCHC 33.6  30.0 - 36.0 g/dL   RDW 13.1  11.5 - 15.5 %   Platelets 408 (*) 150 - 400 K/uL   Neutrophils Relative 79 (*) 43 - 77 %   Neutro Abs 6.7  1.7 - 7.7 K/uL   Lymphocytes Relative 8 (*) 12 - 46 %   Lymphs Abs 0.6 (*) 0.7 - 4.0 K/uL   Monocytes Relative 13 (*) 3 - 12 %   Monocytes Absolute 1.1 (*) 0.1 - 1.0 K/uL   Eosinophils Relative 0  0 - 5 %   Eosinophils Absolute 0.0  0.0 - 0.7 K/uL   Basophils Relative 0  0 - 1 %   Basophils Absolute 0.0  0.0 - 0.1 K/uL  COMPREHENSIVE METABOLIC PANEL      Component Value Range   Sodium 131 (*) 135 - 145 mEq/L   Potassium 4.1  3.5 - 5.1 mEq/L   Chloride 95 (*) 96 - 112 mEq/L   CO2 27  19 - 32 mEq/L   Glucose, Bld 122 (*) 70 - 99 mg/dL   BUN 19  6 - 23 mg/dL   Creatinine, Ser 0.87  0.50 - 1.10 mg/dL   Calcium 7.8 (*) 8.4 - 10.5 mg/dL   Total Protein 6.1  6.0 - 8.3 g/dL   Albumin 2.6 (*) 3.5 - 5.2 g/dL   AST 13  0 - 37 U/L   ALT 15  0 - 35 U/L   Alkaline Phosphatase 34 (*) 39 - 117 U/L   Total Bilirubin 0.4  0.3 - 1.2 mg/dL   GFR calc non Af Amer 64 (*) >90 mL/min   GFR calc Af Amer 74 (*) >90 mL/min  URINALYSIS, ROUTINE W REFLEX MICROSCOPIC      Component Value Range   Color, Urine YELLOW   YELLOW   APPearance CLEAR  CLEAR   Specific Gravity, Urine >1.030 (*) 1.005 - 1.030   pH 5.5  5.0 - 8.0   Glucose, UA NEGATIVE  NEGATIVE mg/dL   Hgb urine dipstick TRACE (*) NEGATIVE   Bilirubin Urine SMALL (*) NEGATIVE   Ketones, ur TRACE (*) NEGATIVE mg/dL   Protein, ur NEGATIVE  NEGATIVE mg/dL   Urobilinogen, UA 0.2  0.0 - 1.0 mg/dL   Nitrite NEGATIVE  NEGATIVE   Leukocytes, UA NEGATIVE  NEGATIVE  CULTURE, BLOOD (ROUTINE X 2)      Component Value Range   Specimen Description Blood     Special Requests NONE     Culture NO GROWTH <24 HRS     Report Status PENDING    CULTURE, BLOOD (ROUTINE X 2)      Component Value Range   Specimen Description Blood     Special Requests NONE     Culture NO GROWTH <24 HRS     Report Status PENDING    APTT      Component Value Range   aPTT 38 (*) 24 - 37 seconds  PROTIME-INR      Component Value Range   Prothrombin Time 26.9 (*) 11.6 - 15.2 seconds   INR 2.64 (*) 0.00 - 1.49  URINE MICROSCOPIC-ADD ON  Component Value Range   WBC, UA 0-2  <3 WBC/hpf   RBC / HPF 0-2  <3 RBC/hpf   Bacteria, UA MANY (*) RARE    Laboratory interpretation all normal except for hyponatremia, low chloride, concentrated urine, mild anemia  Ct Abdomen Pelvis W Contrast  06/07/2012  *RADIOLOGY REPORT*  Clinical Data: Left lower quadrant pain.  Rectal bleeding.  Nausea. The ulcer colitis.  Personal history of bilateral breast carcinoma.  CT ABDOMEN AND PELVIS WITH CONTRAST  Technique:  Multidetector CT imaging of the abdomen and pelvis was performed following the standard protocol during bolus administration of intravenous contrast.  Contrast: 171m OMNIPAQUE IOHEXOL 300 MG/ML  SOLN  Comparison: None.  Findings: Mild colonic wall thickening and loss of normal haustral fold pattern is seen involving the splenic flexure, descending, and rectosigmoid colon.  This is consistent with the patient's history of ulcerative colitis.  There is no evidence of terminal ileum or  other small bowel involvement.  No evidence of bowel obstruction. No evidence of abscess or free fluid.  Uterus and adnexa are unremarkable in appearance.  The abdominal parenchymal organs are normal in appearance.  Gallbladder is unremarkable.  No soft tissue masses or lymphadenopathy identified within the abdomen or pelvis.  IMPRESSION:  1.  Mild colitis involving the splenic flexure, descending, and rectosigmoid colon, consistent with known history of ulcerative colitis. 2.  No evidence of complication or other significant abnormality.   Original Report Authenticated By: JMarlaine Hind M.D.      1. Colitis   2. Dehydration   3. Anemia   4. Hyponatremia   5. Chloride, decreased level    Plan admission    MDM   I personally performed the services described in this documentation, which was scribed in my presence. The recorded information has been reviewed and considered.  IRolland Porter MD, FAbram Sander   IJanice Norrie MD 06/07/12 [id number]

## 2012-06-07 NOTE — ED Notes (Signed)
Pt ambulated to restroom. 

## 2012-06-07 NOTE — Progress Notes (Signed)
ANTICOAGULATION CONSULT NOTE - Initial Consult  Pharmacy Consult for Warfarin Indication: DVT  Allergies  Allergen Reactions  . Sulfa Antibiotics Other (See Comments)    Extreme Weakness    Patient Measurements: Height: 5' 3"  (160 cm) Weight: 152 lb (68.947 kg) IBW/kg (Calculated) : 52.4    Vital Signs: Temp: 98.4 F (36.9 C) (10/21 1643) Temp src: Oral (10/21 1643) BP: 103/66 mmHg (10/21 1643) Pulse Rate: 91  (10/21 1643)  Labs:  Basename 06/07/12 0924  HGB 11.6*  HCT 34.5*  PLT 408*  APTT 38*  LABPROT 26.9*  INR 2.64*  HEPARINUNFRC --  CREATININE 0.87  CKTOTAL --  CKMB --  TROPONINI --    Estimated Creatinine Clearance: 52.8 ml/min (by C-G formula based on Cr of 0.87).   Medical History: Past Medical History  Diagnosis Date  . UC (ulcerative colitis)   . Lower abdominal pain   . Occult blood in stools   . Hemorrhoids, external   . HX: breast cancer   . DVT (deep venous thrombosis)   . Seizures   . Stroke     Medications:  Scheduled:    . ciprofloxacin  400 mg Intravenous Q12H  . influenza  inactive virus vaccine  0.5 mL Intramuscular Tomorrow-1000  . Mesalamine  1,600 mg Oral BID  . methylPREDNISolone (SOLU-MEDROL) injection  60 mg Intravenous Q12H  . metronidazole  500 mg Intravenous Q8H  . morphine  4 mg Intravenous Once  . ondansetron  4 mg Intravenous Once  . pantoprazole      . pneumococcal 23 valent vaccine  0.5 mL Intramuscular Tomorrow-1000  . sodium chloride  700 mL Intravenous Once  . warfarin  5 mg Oral Once  . Warfarin - Pharmacist Dosing Inpatient   Does not apply q1800    Assessment: INR therpeutic History of DVT Coumadin home regiment: 5 mg Monday, Wednesday, Friday, 2.5 mg other days  Goal of Therapy:  INR 2-3 Monitor platelets by anticoagulation protocol: Yes   Plan:  Coumadin 5 mg po tonight INR/PT daily CBC, monitor platelets  Abner Greenspan, Byrne Capek Bennett 06/07/2012,9:02 PM

## 2012-06-08 ENCOUNTER — Ambulatory Visit (INDEPENDENT_AMBULATORY_CARE_PROVIDER_SITE_OTHER): Payer: BC Managed Care – PPO | Admitting: Internal Medicine

## 2012-06-08 LAB — COMPREHENSIVE METABOLIC PANEL
ALT: 11 U/L (ref 0–35)
AST: 9 U/L (ref 0–37)
Albumin: 2.1 g/dL — ABNORMAL LOW (ref 3.5–5.2)
Alkaline Phosphatase: 29 U/L — ABNORMAL LOW (ref 39–117)
BUN: 12 mg/dL (ref 6–23)
CO2: 24 mEq/L (ref 19–32)
Calcium: 6.9 mg/dL — ABNORMAL LOW (ref 8.4–10.5)
Chloride: 102 mEq/L (ref 96–112)
Creatinine, Ser: 0.74 mg/dL (ref 0.50–1.10)
GFR calc Af Amer: >90 mL/min (ref >90)
GFR calc non Af Amer: 82 mL/min — ABNORMAL LOW (ref >90)
Glucose, Bld: 138 mg/dL — ABNORMAL HIGH (ref 70–99)
Potassium: 4 mEq/L (ref 3.5–5.1)
Sodium: 135 mEq/L (ref 135–145)
Total Bilirubin: 0.2 mg/dL — ABNORMAL LOW (ref 0.3–1.2)
Total Protein: 5.5 g/dL — ABNORMAL LOW (ref 6.0–8.3)

## 2012-06-08 LAB — PROTIME-INR
INR: 3.74 — ABNORMAL HIGH (ref 0.00–1.49)
Prothrombin Time: 34.8 seconds — ABNORMAL HIGH (ref 11.6–15.2)

## 2012-06-08 LAB — TSH: TSH: 0.446 u[IU]/mL (ref 0.350–4.500)

## 2012-06-08 LAB — CBC
HCT: 29.6 % — ABNORMAL LOW (ref 36.0–46.0)
Hemoglobin: 9.9 g/dL — ABNORMAL LOW (ref 12.0–15.0)
MCH: 31.1 pg (ref 26.0–34.0)
MCHC: 33.4 g/dL (ref 30.0–36.0)
MCV: 93.1 fL (ref 78.0–100.0)
Platelets: 312 10*3/uL (ref 150–400)
RBC: 3.18 MIL/uL — ABNORMAL LOW (ref 3.87–5.11)
RDW: 13.1 % (ref 11.5–15.5)
WBC: 5.2 10*3/uL (ref 4.0–10.5)

## 2012-06-08 LAB — URINE CULTURE
Colony Count: NO GROWTH
Culture: NO GROWTH

## 2012-06-08 MED ORDER — MESALAMINE 400 MG PO CPDR
1600.0000 mg | DELAYED_RELEASE_CAPSULE | Freq: Two times a day (BID) | ORAL | Status: DC
  Administered 2012-06-08 – 2012-06-10 (×5): 1600 mg via ORAL
  Filled 2012-06-08 (×15): qty 4

## 2012-06-08 NOTE — Progress Notes (Signed)
TPMT test not drawn today. Paperwork was given to Case [facility], [name]. Downtime lab paper with TBMP enzyme order and Dr. Olevia Perches signature at the request of the lab.  Night shift nurse is aware that this lab needs to be done in the am. And nurse tomorrow should request this paperwork back from case Mx. Monique Day

## 2012-06-08 NOTE — Progress Notes (Signed)
[place] for Warfarin Indication: DVT  Allergies  Allergen Reactions  . Sulfa Antibiotics Other (See Comments)    Extreme Weakness    Patient Measurements: Height: 5' 3"  (160 cm) Weight: 152 lb (68.947 kg) IBW/kg (Calculated) : 52.4    Vital Signs: Temp: 99.2 F (37.3 C) (10/22 0100) Temp src: Oral (10/22 0100)  Labs:  Basename 06/08/12 0533 06/07/12 0924  HGB 9.9* 11.6*  HCT 29.6* 34.5*  PLT 312 408*  APTT -- 38*  LABPROT 34.8* 26.9*  INR 3.74* 2.64*  HEPARINUNFRC -- --  CREATININE 0.74 0.87  CKTOTAL -- --  CKMB -- --  TROPONINI -- --    Estimated Creatinine Clearance: 57.5 ml/min (by C-G formula based on Cr of 0.74).   Medical History: Past Medical History  Diagnosis Date  . UC (ulcerative colitis)   . Lower abdominal pain   . Occult blood in stools   . Hemorrhoids, external   . HX: breast cancer   . DVT (deep venous thrombosis)   . Seizures   . Stroke     Medications:  Scheduled:     . ciprofloxacin  400 mg Intravenous Q12H  . influenza  inactive virus vaccine  0.5 mL Intramuscular Tomorrow-1000  . Mesalamine  1,600 mg Oral BID  . methylPREDNISolone (SOLU-MEDROL) injection  60 mg Intravenous Q12H  . metronidazole  500 mg Intravenous Q8H  . morphine  4 mg Intravenous Once  . pantoprazole      . pneumococcal 23 valent vaccine  0.5 mL Intramuscular Tomorrow-1000  . warfarin  5 mg Oral Once  . Warfarin - Pharmacist Dosing Inpatient   Does not apply q1800  . DISCONTD: Mesalamine  1,600 mg Oral BID    Assessment: INR supra-therapeutic today and rising quickly. History of DVT Coumadin home regimen: 5 mg Monday, Wednesday, Friday, 2.5 mg other days  Goal of Therapy:  INR 2-3 Monitor platelets by anticoagulation protocol: Yes   Plan: NO coumadin today (hold) INR daily CBC, monitor platelets  [place] Crane, Jadwiga Faidley A 06/08/2012,11:39 AM

## 2012-06-08 NOTE — Consult Note (Signed)
Reason for consultation; Flare up of ulcerative colitis. History of present illness; Patient is 76 year old Caucasian female who has chronic ulcerative colitis and is well known to me from previous evaluation call for office about 5 weeks ago with complaints of rectal bleeding and diarrhea and she was begun on prednisone 20 mg daily with instructions to taper her dose of 5 mg every week. She immediately noted improvement and went on a trip to [place]. While in [place] her diarrhea and rectal bleeding relapse when she dropped prednisone dose from 10 to 5 mg. While in [place] she was seen in emergency room and prednisone dose bump to 40 mg daily and once again chief felt better. However on tapering dose she got sick again with abdominal cramps rectal bleeding and diarrhea. Patient called our office last week and was to be seen today. However she developed worsening diarrhea and started to pass more blood and was up all night going to the bathroom. She therefore came to the emergency room and was hospitalized. She was begun on IV Solu-Medrol, IV antibiotics and clear liquids. Patient is chronically anticoagulated. Her INR this morning was 3.74 and a warfarin is on hold. Patient has experienced heaving when she has cramping but denies vomiting, fever or chills. No history of recent antibiotic use. On to this episode her appetite has been good and she has not lost any weight. She does not take any NSAIDs. Patient's last colonoscopy was in July 2012 revealing active disease in rectosigmoid area. Home medications; Asacol 1.6 g by mouth twice a day. Prednisone 5 mg by mouth daily. Warfarin 5 mg 3 times a week and 2.5 mg 4 times a week. Calcium with vitamin D 1 to 2 tablets daily. [place] D3 4000 units by mouth daily. CoQ10 100 mg by mouth daily. Denosumab 60 mg subcutaneous every 6 months. MVI with mineral 1 tablet by mouth daily. Omega-3 fish oil 1.2 g by mouth by mouth daily. Current medications; Ciprofloxacin 4  mg IV every 12 hours. Metronidazole 500 mg IV every 8 hours. Mesalamine 1.6 g by mouth twice a day. Mediastinal prednisone 60 mg IV every 12 hours. Past medical history; Chronic ulcerative colitis. Patient was diagnosed at age 89 and after initial treatment remained in remission for several years. She last had flareup over a year ago. Last colonoscopy was in July 2012 as above. She had laparotomy at the time of initial diagnosis but no details available. Left breast carcinoma treated with lumpectomy followed by chemoradiation in 1998. Right breast carcinoma treated with mastectomy in 2009. Surgery on right wrist for fracture in 2000. Recurrent DVT necessitating chronic anti-coagulation. 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 She was treated with Dilantin and then Keppra under these medications were stopped by a neurologist. Osteopenia. Allergies; Sulfa; Family history; Negative for IBD or CRC. Social history. She is retired. She's been married 3 times; first two  marriages  ended in divorce and her third husband is deceased. She does not smoke cigarettes or drink alcohol. Physical exam; BP 97/58  Pulse 93  Temp 99.2 F (37.3 C) (Oral)  Resp 18  Ht 5' 3"  (1.6 m)  Wt 152 lb (68.947 kg)  BMI 26.93 kg/m2  SpO2 96% Patient is alert and in no acute distress. Conjunctiva is somewhat pale. Sclerae nonicteric. Oropharyngeal mucosa is normal No neck masses or thyromegaly noted. Cardiac exam with regular rhythm normal S1 and S2. No murmur or gallop noted. Abdomen is symmetrical. Bowel sounds are normal.  On palpation soft abdomen with mild tenderness at LLQ but no organomegaly or masses noted. No peripheral edema or clubbing noted. Lab data; From admission. WBC 8.4, H&H 11.6 and 34.5, platelet count 408K. Serum sodium 131, potassium 4.1, chloride 95, CO2 27, glucose 122, BUN 19, creatinine oh 0.87, bilirubin oh 0.4, AP 34, AST 13, ALT 15 total protein 6.1 with albumin of  2.6 and calcium 7.8. INR 2.64. Lab data from this morning. INR 3.74 H&H 9.9 and 29.6. Abdominopelvic CT reviewed. Changes of colitis or colonic wall thickening involving distal half of the colon, splenic flexure down to rectosigmoid area. Assessment; Patient's symptoms are suggestive of relapse of ulcerative colitis. She does not appear to be acutely ill or toxic. She is passing more blood per rectum since she is anticoagulated. I agree with stool studies and empiric antibiotic therapy until these results are available. She is on reasonable dose of oral mesalamine and while there is room to go up on the dose I am concerned it may not work and therefore other treatment options should be explored. Treatment options include 6-MP or therapy with biologics. Both of these agents could be used for induction and maintenance. If she is able to tolerate it I would recommend 6-MP but first will need to check TPMT activity. Recommendations; Continue IV steroids for now. Continue antibiotics until stool studies completed. Serum TPMT assay. Diet advance to full liquids. Monitor H&H.

## 2012-06-08 NOTE — Progress Notes (Signed)
[facility]             Progress Note   Subjective: Patient is feeling better today. Abd pain improving.  Did have a small bowel movement with some blood.  Objective: Vital signs in last 24 hours: Temp:  [98 F (36.7 C)-100.4 F (38 C)] 98 F (36.7 C) (10/22 1452) Pulse Rate:  [86-93] 86  (10/22 1452) Resp:  [18-20] 20  (10/22 1452) BP: (97-109)/(58-65) 109/65 mmHg (10/22 1452) SpO2:  [96 %-97 %] 97 % (10/22 1452) Weight change:  Last BM Date: 06/07/12  Intake/Output from previous day: 10/21 0701 - 10/22 0700 In: 240 [P.O.:240] Out: 1 [Urine:1]     Physical Exam: General: Alert, awake, oriented x3, in no acute distress. HEENT: No bruits, no goiter. Heart: Regular rate and rhythm, without murmurs, rubs, gallops. Lungs: Clear to auscultation bilaterally. Abdomen: Soft, nontender, nondistended, positive bowel sounds. Extremities: No clubbing cyanosis or edema with positive pedal pulses. Neuro: Grossly intact, nonfocal.    Lab Results: Basic Metabolic Panel:  Basename 06/08/12 0533 06/07/12 0924  NA 135 131*  K 4.0 4.1  CL 102 95*  CO2 24 27  GLUCOSE 138* 122*  BUN 12 19  CREATININE 0.74 0.87  CALCIUM 6.9* 7.8*  MG -- --  PHOS -- --   Liver Function Tests:  Basename 06/08/12 0533 06/07/12 0924  AST 9 13  ALT 11 15  ALKPHOS 29* 34*  BILITOT 0.2* 0.4  PROT 5.5* 6.1  ALBUMIN 2.1* 2.6*   No results found for this basename: LIPASE:2,AMYLASE:2 in the last 72 hours No results found for this basename: AMMONIA:2 in the last 72 hours CBC:  Basename 06/08/12 0533 06/07/12 0924  WBC 5.2 8.4  NEUTROABS -- 6.7  HGB 9.9* 11.6*  HCT 29.6* 34.5*  MCV 93.1 91.8  PLT 312 408*   Cardiac Enzymes: No results found for this basename: CKTOTAL:3,CKMB:3,CKMBINDEX:3,TROPONINI:3 in the last 72 hours BNP: No results found for this basename: PROBNP:3 in the last 72 hours D-Dimer: No results found for this basename: DDIMER:2 in the last 72 hours CBG: No  results found for this basename: GLUCAP:6 in the last 72 hours Hemoglobin A1C: No results found for this basename: HGBA1C in the last 72 hours Fasting Lipid Panel: No results found for this basename: CHOL,HDL,LDLCALC,TRIG,CHOLHDL,LDLDIRECT in the last 72 hours Thyroid Function Tests:  Basename 06/07/12 1809  TSH 0.446  T4TOTAL --  FREET4 --  T3FREE --  THYROIDAB --   Anemia Panel: No results found for this basename: VITAMINB12,FOLATE,FERRITIN,TIBC,IRON,RETICCTPCT in the last 72 hours Coagulation:  Basename 06/08/12 0533 06/07/12 0924  LABPROT 34.8* 26.9*  INR 3.74* 2.64*   Urine Drug Screen: Drugs of Abuse  No results found for this basename: labopia, cocainscrnur, labbenz, amphetmu, thcu, labbarb    Alcohol Level: No results found for this basename: ETH:2 in the last 72 hours Urinalysis:  Basename 06/07/12 0916  COLORURINE YELLOW  LABSPEC >1.030*  PHURINE 5.5  GLUCOSEU NEGATIVE  HGBUR TRACE*  BILIRUBINUR SMALL*  KETONESUR TRACE*  PROTEINUR NEGATIVE  UROBILINOGEN 0.2  NITRITE NEGATIVE  LEUKOCYTESUR NEGATIVE    Recent Results (from the past 240 hour(s))  CULTURE, BLOOD (ROUTINE X 2)     Status: Normal (Preliminary result)   Collection Time   06/07/12  9:25 AM      Component Value Range Status Comment   Specimen Description BLOOD LEFT ANTECUBITAL DRAWN BY RN   Final    Special Requests     Final    Value: BOTTLES  DRAWN AEROBIC AND ANAEROBIC AEB=6CC ANA=4CC   Culture NO GROWTH 1 DAY   Final    Report Status PENDING   Incomplete   CULTURE, BLOOD (ROUTINE X 2)     Status: Normal (Preliminary result)   Collection Time   06/07/12  9:26 AM      Component Value Range Status Comment   Specimen Description BLOOD LEFT HAND   Final    Special Requests BOTTLES DRAWN AEROBIC ONLY 8CC   Final    Culture NO GROWTH 1 DAY   Final    Report Status PENDING   Incomplete     Studies/Results: Ct Abdomen Pelvis W Contrast  06/07/2012  *RADIOLOGY REPORT*  Clinical Data: Left  lower quadrant pain.  Rectal bleeding.  Nausea. The ulcer colitis.  Personal history of bilateral breast carcinoma.  CT ABDOMEN AND PELVIS WITH CONTRAST  Technique:  Multidetector CT imaging of the abdomen and pelvis was performed following the standard protocol during bolus administration of intravenous contrast.  Contrast: 12m OMNIPAQUE IOHEXOL 300 MG/ML  SOLN  Comparison: None.  Findings: Mild colonic wall thickening and loss of normal haustral fold pattern is seen involving the splenic flexure, descending, and rectosigmoid colon.  This is consistent with the patient's history of ulcerative colitis.  There is no evidence of terminal ileum or other small bowel involvement.  No evidence of bowel obstruction. No evidence of abscess or free fluid.  Uterus and adnexa are unremarkable in appearance.  The abdominal parenchymal organs are normal in appearance.  Gallbladder is unremarkable.  No soft tissue masses or lymphadenopathy identified within the abdomen or pelvis.  IMPRESSION:  1.  Mild colitis involving the splenic flexure, descending, and rectosigmoid colon, consistent with known history of ulcerative colitis. 2.  No evidence of complication or other significant abnormality.   Original Report Authenticated By: JMarlaine Hind M.D.     Medications: Scheduled Meds:   . ciprofloxacin  400 mg Intravenous Q12H  . influenza  inactive virus vaccine  0.5 mL Intramuscular Tomorrow-1000  . Mesalamine  1,600 mg Oral BID  . methylPREDNISolone (SOLU-MEDROL) injection  60 mg Intravenous Q12H  . metronidazole  500 mg Intravenous Q8H  . pantoprazole      . pneumococcal 23 valent vaccine  0.5 mL Intramuscular Tomorrow-1000  . Warfarin - Pharmacist Dosing Inpatient   Does not apply q1800  . DISCONTD: Mesalamine  1,600 mg Oral BID   Continuous Infusions:   . 0.9 % NaCl with KCl 20 mEq / L 75 mL/hr at 06/08/12 1101   PRN Meds:.acetaminophen, acetaminophen, HYDROcodone-acetaminophen, morphine injection,  ondansetron (ZOFRAN) IV, ondansetron  Assessment/Plan:  Active Problems:  UC (ulcerative colitis confined to rectum)  DVT (deep venous thrombosis)  Hyponatremia  Dehydration  Anemia  Plan:  1. Ulcerative colitis.  Appreciate GI assistance.  She is continued on steroids, asacol.  Considering starting 6MP.  She is antibiotics and stool studies are pending.  2. 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 She is on lifelong anticoagulation.  Will need to follow hemoglobins.  If it continues to decline, then may need to hold coumadin. Clinically, bleeding does not appear to be significant.  Will need to follow serial hemoglobins  3. Anemia.  Continue to follow hemoglobins.  Likely due to blood loss in stools from ulcerative colitis  4. Dehydration.  Improved with IV fluids.  5. Dispo.  Anticipate she will be ready for discharge in the next 1-2 days  Time spent coordinating care: 290ms  LOS: 1 day   MEMON,JEHANZEB [facility] Pager: [phone] 06/08/2012, 7:19 PM

## 2012-06-09 DIAGNOSIS — K5120 Ulcerative (chronic) proctitis without complications: Secondary | ICD-10-CM

## 2012-06-09 DIAGNOSIS — D62 Acute posthemorrhagic anemia: Secondary | ICD-10-CM | POA: Diagnosis present

## 2012-06-09 DIAGNOSIS — T45515A Adverse effect of anticoagulants, initial encounter: Secondary | ICD-10-CM | POA: Diagnosis present

## 2012-06-09 DIAGNOSIS — K921 Melena: Secondary | ICD-10-CM

## 2012-06-09 DIAGNOSIS — K625 Hemorrhage of anus and rectum: Secondary | ICD-10-CM

## 2012-06-09 DIAGNOSIS — K5190 Ulcerative colitis, unspecified, without complications: Secondary | ICD-10-CM

## 2012-06-09 DIAGNOSIS — D6832 Hemorrhagic disorder due to extrinsic circulating anticoagulants: Secondary | ICD-10-CM | POA: Diagnosis present

## 2012-06-09 HISTORY — DX: Hemorrhagic disorder due to extrinsic circulating anticoagulants: D68.32

## 2012-06-09 LAB — HEMOGLOBIN AND HEMATOCRIT, BLOOD
HCT: 29.6 % — ABNORMAL LOW (ref 36.0–46.0)
Hemoglobin: 9.9 g/dL — ABNORMAL LOW (ref 12.0–15.0)

## 2012-06-09 LAB — PROTIME-INR
INR: 5.91 (ref 0.00–1.49)
Prothrombin Time: 48.9 seconds — ABNORMAL HIGH (ref 11.6–15.2)

## 2012-06-09 MED ORDER — VITAMIN K1 10 MG/ML IJ SOLN
2.5000 mg | INTRAMUSCULAR | Status: AC
  Administered 2012-06-09: 2.5 mg via SUBCUTANEOUS
  Filled 2012-06-09: qty 1

## 2012-06-09 MED ORDER — METRONIDAZOLE 500 MG PO TABS
250.0000 mg | ORAL_TABLET | Freq: Three times a day (TID) | ORAL | Status: DC
  Administered 2012-06-09 – 2012-06-10 (×3): 250 mg via ORAL
  Filled 2012-06-09: qty 2
  Filled 2012-06-09 (×2): qty 1

## 2012-06-09 MED ORDER — CIPROFLOXACIN HCL 250 MG PO TABS
500.0000 mg | ORAL_TABLET | Freq: Two times a day (BID) | ORAL | Status: DC
  Administered 2012-06-09 – 2012-06-10 (×2): 500 mg via ORAL
  Filled 2012-06-09 (×3): qty 2

## 2012-06-09 MED ORDER — METHYLPREDNISOLONE SODIUM SUCC 40 MG IJ SOLR
40.0000 mg | Freq: Two times a day (BID) | INTRAMUSCULAR | Status: DC
  Administered 2012-06-09 – 2012-06-10 (×2): 40 mg via INTRAVENOUS
  Filled 2012-06-09 (×2): qty 1

## 2012-06-09 NOTE — Progress Notes (Signed)
INITIAL ADULT NUTRITION ASSESSMENT Date: 06/09/2012   Time: 3:21 PM Reason for Assessment: Malnutrition Screen  ASSESSMENT: Female 76 y.o.  Dx: Ulcerative Colitis   Past Medical History  Diagnosis Date  . UC (ulcerative colitis)   . Lower abdominal pain   . Occult blood in stools   . Hemorrhoids, external   . HX: breast cancer   . DVT (deep venous thrombosis)   . Seizures   . Stroke     Scheduled Meds:   . ciprofloxacin  500 mg Oral BID  . influenza  inactive virus vaccine  0.5 mL Intramuscular Tomorrow-1000  . Mesalamine  1,600 mg Oral BID  . methylPREDNISolone (SOLU-MEDROL) injection  40 mg Intravenous Q12H  . metroNIDAZOLE  250 mg Oral Q8H  . phytonadione  2.5 mg Subcutaneous NOW  . pneumococcal 23 valent vaccine  0.5 mL Intramuscular Tomorrow-1000  . Warfarin - Pharmacist Dosing Inpatient   Does not apply q1800  . DISCONTD: ciprofloxacin  400 mg Intravenous Q12H  . DISCONTD: methylPREDNISolone (SOLU-MEDROL) injection  60 mg Intravenous Q12H  . DISCONTD: metronidazole  500 mg Intravenous Q8H   Continuous Infusions:   . 0.9 % NaCl with KCl 20 mEq / L 30 mL/hr at 06/09/12 1233   PRN Meds:.acetaminophen, acetaminophen, HYDROcodone-acetaminophen, morphine injection, ondansetron (ZOFRAN) IV, ondansetron  Ht: 5' 3"  (160 cm)  Wt: 152 lb (68.947 kg)  Ideal Wt: 52.4 kg  % Ideal Wt:132%  Usual Wt:  Wt Readings from Last 10 Encounters:  06/07/12 152 lb (68.947 kg)  11/05/11 164 lb 3.2 oz (74.481 kg)  05/06/11 165 lb (74.844 kg)     Body mass index is 26.93 kg/(m^2). Overweight  Food/Nutrition Related Hx:Pt is very pleasant lady. Reports ulcerative colitis since college some ~32yr ago. Her diet is advanced now to Low Fiber and we talked about diet guidelines.She has good understanding of foods that are recommeded vs not recommended. Wt loss of 12#, 7% since March (7 months) which is not significant. Possible d/c home tomorrow per pt. Pt does not meet criteria for  malnutrition at this time.   CMP     Component Value Date/Time   NA 135 06/08/2012 0533   K 4.0 06/08/2012 0533   CL 102 06/08/2012 0533   CO2 24 06/08/2012 0533   GLUCOSE 138* 06/08/2012 0533   BUN 12 06/08/2012 0533   CREATININE 0.74 06/08/2012 0533   CALCIUM 6.9* 06/08/2012 0533   PROT 5.5* 06/08/2012 0533   ALBUMIN 2.1* 06/08/2012 0533   AST 9 06/08/2012 0533   ALT 11 06/08/2012 0533   ALKPHOS 29* 06/08/2012 0533   BILITOT 0.2* 06/08/2012 0533   GFRNONAA 82* 06/08/2012 0533   GFRAA >90 06/08/2012 0533   I/O last 3 completed shifts: In: 4302.5 [P.O.:600; I.V.:2602.5; IV Piggyback:1100] Out: 1 [Urine:1]   Diet Order: Fiber Restricted  Supplements/Tube Feeding:none at this time  IVF:    0.9 % NaCl with KCl 20 mEq / L Last Rate: 30 mL/hr at 06/09/12 1233    Estimated Nutritional Needs:   Kcal:1518-1725 kcal Protein:69-77 gr Fluid:1 ml/kcal  NUTRITION DIAGNOSIS: -Altered GI function (NI-1.4).  Status: Ongoing  RELATED TO: hematochezia   AS EVIDENCE BY: acute exacerbation of ulcerated colitis  MONITORING/EVALUATION(Goals): -Monitor for diet tolerance, po intake percentage, BM, wt changes and labs -Goal: Pt to meet >/= 90% of their estimated nutrition needs; not met  EDUCATION NEEDS: -Education needs addressed  INTERVENTION: Low Fiber diet per MD RD will follow for nutrition needs  Dietitian #[phone]  DOCUMENTATION CODES Per approved criteria  -Not Applicable    Frederik Schmidt 06/09/2012, 3:21 PM

## 2012-06-09 NOTE — Progress Notes (Signed)
Patient had a TPMT enzyme test to be done yesterday. Special order form had already been filled out and signed my doctor but was accidentally given to case management before 1900. Lab was called at 2000 and they stated there could not be anything done until the morning because the people who knew where the blood would be sent to had already left. They also stated that the blood could not be sent out until 1030. Secretary was notified this morning to report it off to the next shift and also nurse will report it off to next nurse.

## 2012-06-09 NOTE — Progress Notes (Addendum)
Patient ID: Monique Day, female   DOB: 1935-09-25, 76 y.o.   MRN: [id number] States she did not sleep well last night. She says she has not had a BM.  She tells me she had fecal incontinence last night and saw blood.  She denies abdominal pain. She does c/o fullness. Ate most of breakfast. Filed Vitals:   06/08/12 0100 06/08/12 1452 06/08/12 2259 06/09/12 0538  BP:  109/65 111/54 98/62  Pulse:  86 84 85  Temp: 99.2 F (37.3 C) 98 F (36.7 C) 97 F (36.1 C) 98.1 F (36.7 C)  TempSrc: Oral     Resp:  20 20 20   Height:      Weight:      SpO2:  97% 98% 97%   A and P: Will continue to monitor. Stool studies are pending.   GI attending note; Patient switch to by mouth antibiotics. Stool studies are still pending. Solu-Medrol dose reduced to 40 mg IV every 12 hours. Her INR was over 5 today and she received 2.5 mg of vitamin K SQ. TPMT assay will be back within a week. Patient will start 6 appendectomy 100 mg by mouth daily. She'll continue mesalamine at current dose. She will go home on prednisone 40 mg by mouth every morning until office visit in 10-14 days. If she does well she may be able to go home within the next 24-48 hours.

## 2012-06-09 NOTE — Progress Notes (Signed)
CRITICAL VALUE ALERT  Critical value received:  INR of 5.91  Date of notification:  06/09/12  Time of notification:  0620  Critical value read back:yes  Nurse who received alert:  Reynold Bowen, RN  MD notified (1st page):  Megan Salon  Time of first page:  0626  MD notified (2nd page):  Time of second page:  Responding MD:  Megan Salon  Time MD responded: [phone]  Dr. Megan Salon called nurse back and asked if patient was actively bleeding from anywhere. Patient is not and is stable. Doctor ordered an H/H to be done at 0900. Will continue to monitor patient.

## 2012-06-09 NOTE — Progress Notes (Signed)
Subjective: The patient had a small bloody bowel movement this morning. She has no abdominal pain.  She is tolerating a full liquid diet. She complains of swelling in her hands and legs.  Objective: Vital signs in last 24 hours: Filed Vitals:   06/08/12 0100 06/08/12 1452 06/08/12 2259 06/09/12 0538  BP:  109/65 111/54 98/62  Pulse:  86 84 85  Temp: 99.2 F (37.3 C) 98 F (36.7 C) 97 F (36.1 C) 98.1 F (36.7 C)  TempSrc: Oral     Resp:  20 20 20   Height:      Weight:      SpO2:  97% 98% 97%    Intake/Output Summary (Last 24 hours) at 06/09/12 1236 Last data filed at 06/09/12 2297  Gross per 24 hour  Intake 4022.5 ml  Output      0 ml  Net 4022.5 ml    Weight change:   Physical exam: General: Pleasant 76 year old Caucasian woman sitting up in a chair, in no acute distress. Lungs: Clear to auscultation bilaterally. Heart: S1, S2, with no murmurs rubs and gallops. Abdomen: Mildly obese, positive bowel sounds, soft, mildly tender in the hypogastrium. No distention. Extremities: Trace of pedal edema.  Lab Results: Basic Metabolic Panel:  Basename 06/08/12 0533 06/07/12 0924  NA 135 131*  K 4.0 4.1  CL 102 95*  CO2 24 27  GLUCOSE 138* 122*  BUN 12 19  CREATININE 0.74 0.87  CALCIUM 6.9* 7.8*  MG -- --  PHOS -- --   Liver Function Tests:  Basename 06/08/12 0533 06/07/12 0924  AST 9 13  ALT 11 15  ALKPHOS 29* 34*  BILITOT 0.2* 0.4  PROT 5.5* 6.1  ALBUMIN 2.1* 2.6*   No results found for this basename: LIPASE:2,AMYLASE:2 in the last 72 hours No results found for this basename: AMMONIA:2 in the last 72 hours CBC:  Basename 06/09/12 0848 06/08/12 0533 06/07/12 0924  WBC -- 5.2 8.4  NEUTROABS -- -- 6.7  HGB 9.9* 9.9* --  HCT 29.6* 29.6* --  MCV -- 93.1 91.8  PLT -- 312 408*   Cardiac Enzymes: No results found for this basename: CKTOTAL:3,CKMB:3,CKMBINDEX:3,TROPONINI:3 in the last 72 hours BNP: No results found for this basename: PROBNP:3 in the last  72 hours D-Dimer: No results found for this basename: DDIMER:2 in the last 72 hours CBG: No results found for this basename: GLUCAP:6 in the last 72 hours Hemoglobin A1C: No results found for this basename: HGBA1C in the last 72 hours Fasting Lipid Panel: No results found for this basename: CHOL,HDL,LDLCALC,TRIG,CHOLHDL,LDLDIRECT in the last 72 hours Thyroid Function Tests:  Basename 06/07/12 1809  TSH 0.446  T4TOTAL --  FREET4 --  T3FREE --  THYROIDAB --   Anemia Panel: No results found for this basename: VITAMINB12,FOLATE,FERRITIN,TIBC,IRON,RETICCTPCT in the last 72 hours Coagulation:  Basename 06/09/12 0525 06/08/12 0533  LABPROT 48.9* 34.8*  INR 5.91* 3.74*   Urine Drug Screen: Drugs of Abuse  No results found for this basename: labopia,  cocainscrnur,  labbenz,  amphetmu,  thcu,  labbarb    Alcohol Level: No results found for this basename: ETH:2 in the last 72 hours Urinalysis:  Basename 06/07/12 0916  COLORURINE YELLOW  LABSPEC >1.030*  PHURINE 5.5  GLUCOSEU NEGATIVE  HGBUR TRACE*  BILIRUBINUR SMALL*  KETONESUR TRACE*  PROTEINUR NEGATIVE  UROBILINOGEN 0.2  NITRITE NEGATIVE  LEUKOCYTESUR NEGATIVE   Misc. Labs:   Micro: Recent Results (from the past 240 hour(s))  URINE CULTURE  Status: Normal   Collection Time   06/07/12  9:16 AM      Component Value Range Status Comment   Specimen Description URINE, CLEAN CATCH   Final    Special Requests NONE   Final    Culture  Setup Time 06/08/2012 01:22   Final    Colony Count NO GROWTH   Final    Culture NO GROWTH   Final    Report Status 06/08/2012 FINAL   Final   CULTURE, BLOOD (ROUTINE X 2)     Status: Normal (Preliminary result)   Collection Time   06/07/12  9:25 AM      Component Value Range Status Comment   Specimen Description BLOOD LEFT ANTECUBITAL DRAWN BY RN   Final    Special Requests     Final    Value: BOTTLES DRAWN AEROBIC AND ANAEROBIC AEB=6CC ANA=4CC   Culture NO GROWTH 2 DAYS   Final     Report Status PENDING   Incomplete   CULTURE, BLOOD (ROUTINE X 2)     Status: Normal (Preliminary result)   Collection Time   06/07/12  9:26 AM      Component Value Range Status Comment   Specimen Description BLOOD LEFT HAND   Final    Special Requests BOTTLES DRAWN AEROBIC ONLY 8CC   Final    Culture NO GROWTH 2 DAYS   Final    Report Status PENDING   Incomplete     Studies/Results: No results found.  Medications:  Scheduled:    . ciprofloxacin  400 mg Intravenous Q12H  . influenza  inactive virus vaccine  0.5 mL Intramuscular Tomorrow-1000  . Mesalamine  1,600 mg Oral BID  . methylPREDNISolone (SOLU-MEDROL) injection  60 mg Intravenous Q12H  . metronidazole  500 mg Intravenous Q8H  . phytonadione  2.5 mg Subcutaneous NOW  . pneumococcal 23 valent vaccine  0.5 mL Intramuscular Tomorrow-1000  . Warfarin - Pharmacist Dosing Inpatient   Does not apply q1800   Continuous:    . 0.9 % NaCl with KCl 20 mEq / L 75 mL/hr at 06/09/12 0423   CXK:[id number], acetaminophen, HYDROcodone-acetaminophen, morphine injection, ondansetron (ZOFRAN) IV, ondansetron  Assessment: Active Problems:  UC (ulcerative colitis confined to rectum)  DVT (deep venous thrombosis)  Hyponatremia  Dehydration  Anemia due to blood loss, acute  Hematochezia  Warfarin-induced coagulopathy   1. Hematochezia secondary to acute exacerbation of ulcerated colitis. We'll continue mesalamine, Cipro, metronidazole, and Solu-Medrol. She still has hematochezia, but it has slowed down significantly according to her. Further recommendations per Dr. Laural Golden. TPMT assay pending.  Acute on chronic anemia secondary to acute blood loss. Her hemoglobin was 11.6 on admission and is 9.9 today. The decrease is secondary to blood loss and hemodilution from IV fluids. We'll continue to monitor closely.  Coagulopathy/supratherapeutic INR. Her INR has increased from 2.64 to 3.75 to 5.91 today. This appears to be exacerbated by  antibiotic/antiparasitic treatment. In light of hematochezia, vitamin K was ordered.  DVT. Coumadin is on hold. As above.  Hyponatremia and dehydration. Resolved.  Mild peripheral edema. This may be secondary to IV fluid hydration and steroids. She appears to be well-hydrated now, and therefore, we'll decrease the rate of IV fluids.   Plan:  1. 2.5 mg of vitamin K IM/SQ was ordered. We'll continue to follow her INR daily. 2. Continue to follow her CBC daily. 3. Decrease the IV fluids. 4. Further recommendations will be deferred to Dr. Laural Golden.   LOS: 2 days  Tonianne Fine 06/09/2012, 12:36 PM

## 2012-06-09 NOTE — Progress Notes (Signed)
[place] for Warfarin Indication: DVT  Allergies  Allergen Reactions  . Sulfa Antibiotics Other (See Comments)    Extreme Weakness   Patient Measurements: Height: 5' 3"  (160 cm) Weight: 152 lb (68.947 kg) IBW/kg (Calculated) : 52.4   Vital Signs: Temp: 98.1 F (36.7 C) (10/23 0538) BP: 98/62 mmHg (10/23 0538) Pulse Rate: 85  (10/23 0538)  Labs:  Basename 06/09/12 0525 06/08/12 0533 06/07/12 0924  HGB -- 9.9* 11.6*  HCT -- 29.6* 34.5*  PLT -- 312 408*  APTT -- -- 38*  LABPROT 48.9* 34.8* 26.9*  INR 5.91* 3.74* 2.64*  HEPARINUNFRC -- -- --  CREATININE -- 0.74 0.87  CKTOTAL -- -- --  CKMB -- -- --  TROPONINI -- -- --   Estimated Creatinine Clearance: 57.5 ml/min (by C-G formula based on Cr of 0.74).  Medical History: Past Medical History  Diagnosis Date  . UC (ulcerative colitis)   . Lower abdominal pain   . Occult blood in stools   . Hemorrhoids, external   . HX: breast cancer   . DVT (deep venous thrombosis)   . Seizures   . Stroke    Medications:  Scheduled:     . ciprofloxacin  400 mg Intravenous Q12H  . influenza  inactive virus vaccine  0.5 mL Intramuscular Tomorrow-1000  . Mesalamine  1,600 mg Oral BID  . methylPREDNISolone (SOLU-MEDROL) injection  60 mg Intravenous Q12H  . metronidazole  500 mg Intravenous Q8H  . pneumococcal 23 valent vaccine  0.5 mL Intramuscular Tomorrow-1000  . Warfarin - Pharmacist Dosing Inpatient   Does not apply q1800   Assessment: INR supra-therapeutic today and continues to rise quickly.  History of DVT.  Coumadin home regimen: 5 mg Monday, Wednesday, Friday, 2.5 mg other days.  GI is following.  Some blood noted in BM yesterday.  No H/H done today.  Goal of Therapy:  INR 2-3 Monitor platelets by anticoagulation protocol: Yes   Plan: NO coumadin today (hold) Consider coumadin reversal with Vitamin K if frank bleeding noted INR daily CBC daily for now.  Monique Day, Monique Day  A 06/09/2012,7:41 AM

## 2012-06-10 DIAGNOSIS — E871 Hypo-osmolality and hyponatremia: Secondary | ICD-10-CM

## 2012-06-10 LAB — CBC
HCT: 27.2 % — ABNORMAL LOW (ref 36.0–46.0)
Hemoglobin: 9.1 g/dL — ABNORMAL LOW (ref 12.0–15.0)
MCH: 30.7 pg (ref 26.0–34.0)
MCHC: 33.5 g/dL (ref 30.0–36.0)
MCV: 91.9 fL (ref 78.0–100.0)
Platelets: 308 10*3/uL (ref 150–400)
RBC: 2.96 MIL/uL — ABNORMAL LOW (ref 3.87–5.11)
RDW: 13.3 % (ref 11.5–15.5)
WBC: 6.6 10*3/uL (ref 4.0–10.5)

## 2012-06-10 LAB — PROTIME-INR
INR: 3.78 — ABNORMAL HIGH (ref 0.00–1.49)
Prothrombin Time: 35.1 seconds — ABNORMAL HIGH (ref 11.6–15.2)

## 2012-06-10 MED ORDER — WARFARIN SODIUM 5 MG PO TABS: ORAL_TABLET | ORAL | Status: DC

## 2012-06-10 MED ORDER — METRONIDAZOLE 250 MG PO TABS: 250.0000 mg | ORAL_TABLET | Freq: Three times a day (TID) | ORAL | Status: DC

## 2012-06-10 MED ORDER — PREDNISONE 20 MG PO TABS: ORAL_TABLET | ORAL | Status: DC

## 2012-06-10 MED ORDER — CIPROFLOXACIN HCL 500 MG PO TABS: 500.0000 mg | ORAL_TABLET | Freq: Two times a day (BID) | ORAL | Status: DC

## 2012-06-10 MED ORDER — DOCUSATE SODIUM 100 MG PO CAPS: 100.0000 mg | ORAL_CAPSULE | Freq: Two times a day (BID) | ORAL | Status: DC | PRN

## 2012-06-10 NOTE — Progress Notes (Signed)
UR Chart Review Completed  

## 2012-06-10 NOTE — Progress Notes (Signed)
[place] [facility] for Warfarin Indication: DVT  Allergies  Allergen Reactions  . Sulfa Antibiotics Other (See Comments)    Extreme Weakness   Patient Measurements: Height: 5' 3"  (160 cm) Weight: 152 lb (68.947 kg) IBW/kg (Calculated) : 52.4   Vital Signs: Temp: 98 F (36.7 C) (10/24 0359) Temp src: Oral (10/24 0359) BP: 113/65 mmHg (10/24 0359) Pulse Rate: 70  (10/24 0359)  Labs:  Monique Day 06/10/12 0521 06/09/12 0848 06/09/12 0525 06/08/12 0533  HGB 9.1* 9.9* -- --  HCT 27.2* 29.6* -- 29.6*  PLT 308 -- -- 312  APTT -- -- -- --  LABPROT 35.1* -- 48.9* 34.8*  INR 3.78* -- 5.91* 3.74*  HEPARINUNFRC -- -- -- --  CREATININE -- -- -- 0.74  CKTOTAL -- -- -- --  CKMB -- -- -- --  TROPONINI -- -- -- --   Estimated Creatinine Clearance: 57.5 ml/min (by C-G formula based on Cr of 0.74).  Medical History: Past Medical History  Diagnosis Date  . UC (ulcerative colitis)   . Lower abdominal pain   . Occult blood in stools   . Hemorrhoids, external   . HX: breast cancer   . DVT (deep venous thrombosis)   . Seizures   . Stroke    Medications:  Scheduled:     . ciprofloxacin  500 mg Oral BID  . influenza  inactive virus vaccine  0.5 mL Intramuscular Tomorrow-1000  . Mesalamine  1,600 mg Oral BID  . methylPREDNISolone (SOLU-MEDROL) injection  40 mg Intravenous Q12H  . metroNIDAZOLE  250 mg Oral Q8H  . phytonadione  2.5 mg Subcutaneous NOW  . pneumococcal 23 valent vaccine  0.5 mL Intramuscular Tomorrow-1000  . Warfarin - Pharmacist Dosing Inpatient   Does not apply q1800  . DISCONTD: ciprofloxacin  400 mg Intravenous Q12H  . DISCONTD: methylPREDNISolone (SOLU-MEDROL) injection  60 mg Intravenous Q12H  . DISCONTD: metronidazole  500 mg Intravenous Q8H   Assessment: INR supra-therapeutic today but trending down now.  Vitamin K 2.39m Sq was given yesterday.  Pt has had some bloody stools per reports.  MD notes reviewed.  History of DVT.   Coumadin home regimen: 5 mg Monday, Wednesday, Friday, 2.5 mg other days.  GI is following.  H/H appears somewhat stable.  Goal of Therapy:  INR 2-3 Monitor platelets by anticoagulation protocol: Yes   Plan: NO coumadin today (hold) INR daily CBC daily for now. Continue to monitor for S/S of bleeding.  [name] Monique Day A 06/10/2012,9:31 AM

## 2012-06-10 NOTE — Discharge Summary (Signed)
. Physician Discharge Summary  Monique Day JQB:[id number] DOB: 09/28/1935 DOA: 06/07/2012  PCP: Octavio Graves, DO  Admit date: 06/07/2012 Discharge date: 06/10/2012  Time spent: Greater than 30  minutes  Recommendations for Outpatient Follow-up:  The patient was discharged to home in improved condition. Dr. Rosina Lowenstein office will call her with the followup appointment. She will followup with Dr. Melina Copa for reassessment of her INR and further management of her DVT next week.   Discharge Diagnoses:  1. Ulcerated colitis with exacerbation. 2. Hematochezia, secondary to #1. 3. Anemia due to acute blood loss. The patient is hemoglobin was 11.6 on admission and 9.1 at the time of discharge. 4. Hyponatremia. Resolved with IV fluid hydration. 4. History of recurrent lower extremity DVT, on chronic Coumadin therapy. (Temporarily withheld due to to hematochezia). 5. Warfarin-induced coagulopathy. The patient's INR was 2.64 on admission, but reached a high of 5.91. This is thought to be secondary to antibiotic therapy. Her INR was 3.78 at the time of discharge.   Discharge Condition: Improved and stable.  Diet recommendation: Low residue.  Filed Weights   06/07/12 1643  Weight: 68.947 kg (152 lb)    History of present illness:  The patient is a 76 year old woman with a history significant for chronic ulcerative colitis, who presented to the emergency department with a chief complaint of bloody stools and mild abdominal pain.. In the emergency department, she was afebrile and hemodynamically stable. Her lab data were significant for a serum sodium of 131, normal liver transaminases, and a hemoglobin of 11.6. CT scan of her abdomen and pelvis revealed mild colitis involving the splenic flexure and descending and rectosigmoid colon, consistent with her known history of ulcerated colitis. There was no evidence of complication or other significant abnormalities. She was admitted for further  evaluation and management.  Hospital Course:  The patient was started on intravenous steroids. Treatment was also continued with intravenous Cipro and Flagyl. She was maintained on mesalamine. A clear liquid diet was started without immediate advancement. She was maintained on warfarin therapy for her history of deep vein thrombosis. This decision was made given that her hemoglobin had been fairly stable despite reports of bleeding. IV fluid hydration was started for hypovolemic hyponatremia/dehydration. Stool studies, urine culture, and blood cultures were ordered. Her gastroenterologist, Dr. Laural Golden was consulted. He agreed with management as started. Given her recurrent symptoms, he discussed treatment options including 6-MP or therapy with biological agents. However, he wanted to check TPMT activity, therefore,TPMT assay was ordered.  Following admission, her INR increased to greater than 3.4. The Coumadin was held. Subsequently, it increased to 5.91. The increase was thought to be secondary to the effects of the IV antibiotics on gut flora. In light of the hematochezia, she was given 2.5 mg of vitamin K subcutaneously. The followup INR improved to 3.78 prior to discharge.  The patient improved clinically and symptomatically. She had no bowel movements during the hospitalization, and therefore, stool studies were not obtained. She continued to have mild to scant rectal bleeding. Her abdominal pain resolved. Her hemoglobin decreased to 9.9 and then subsequently to 9.1 prior to discharge. Dr. Laural Golden changed Cipro and Flagyl to by mouth. The dose of Solu-Medrol was titrated down and then discontinued upon discharge. She was restarted on prednisone upon discharge. Upon further discussion with the patient, Dr. Laural Golden decided that 6-MP would be started in the outpatient setting. He also recommended that the patient restart Coumadin at half the dose in a couple of days. Full  dosing or previous dosing of Coumadin  could be resumed while she is off of antibiotics and/or if her rectal bleeding does not increase. Her INR will be reassessed and monitored by Dr. Melina Copa next week and she will followup with Dr. Laural Golden in 2 weeks.  Procedures:  None.   Consultations:  Hildred Laser, M.D.   Discharge Exam: Filed Vitals:   06/09/12 1450 06/09/12 2053 06/10/12 0359 06/10/12 1406  BP: 145/69 143/71 113/65 115/72  Pulse: 81 83 70 86  Temp: 98.4 F (36.9 C) 98 F (36.7 C) 98 F (36.7 C) 98.1 F (36.7 C)  TempSrc: Oral Oral Oral Oral  Resp: 18 18 18 18   Height:      Weight:      SpO2: 96% 97% 93% 98%    General: Pleasant 76 year old sitting up in a chair, in no acute distress.  Cardiovascular: S1, S2, with no murmurs rubs or gallops.  Respiratory: clear to auscultation bilaterally. Abdomen: Positive bowel sounds, soft, nontender, nondistended.  Discharge Instructions  Discharge Orders    Future Orders Please Complete By Expires   Diet - low sodium heart healthy      Increase activity slowly          Medication List     As of 06/10/2012  4:21 PM    TAKE these medications         ASACOL HD 800 MG Tbec   Generic drug: Mesalamine   Take 1,600 mg by mouth 2 (two) times daily.      CALCIUM 1000 + D PO   Take 1-2 tablets by mouth daily. Takes one tablet in the morning and two tablets in the evening.      ciprofloxacin 500 MG tablet   Commonly known as: CIPRO   Take 1 tablet (500 mg total) by mouth 2 (two) times daily.      Co Q 10 100 MG Caps   Take 1 capsule by mouth daily.      denosumab 60 MG/ML Soln injection   Commonly known as: PROLIA   Inject 60 mg into the skin every 6 (six) months. Administer in upper arm, thigh, or abdomen      docusate sodium 100 MG capsule   Commonly known as: COLACE   Take 1 capsule (100 mg total) by mouth 2 (two) times daily as needed for constipation. Stool softener.      metroNIDAZOLE 250 MG tablet   Commonly known as: FLAGYL   Take 1 tablet  (250 mg total) by mouth every 8 (eight) hours.      multivitamin with minerals Tabs   Take 1 tablet by mouth daily.      OMEGA 3 1200 MG Caps   Take 1 capsule by mouth daily.      predniSONE 20 MG tablet   Commonly known as: DELTASONE   Take 40 mg daily as directed by Dr. Laural Golden.      Vitamin D3 2000 UNITS Tabs   Take 4,000 Units by mouth daily. Take 1 every morning and 1 at night        warfarin 5 MG tablet   Commonly known as: COUMADIN   Do not restart warfarin until 06/12/2012. When restarting it, take only 2.5 mg (or half a tablet) daily until you are reevaluated by your primary care physician. At that time, if there is no further rectal bleeding, you can resume the previous dosing under the guidance of Dr. Melina Copa.  Follow-up Information    Follow up with BUTLER, CYNTHIA, DO. In 1 week.   Contact information:   [address] [place] [address] [phone]       Follow up with [name], MD. (Dr. Olevia Perches office will call you with the followup appointment)    Contact information:   [place], [place] [address] [phone]           The results of significant diagnostics from this hospitalization (including imaging, microbiology, ancillary and laboratory) are listed below for reference.    Significant Diagnostic Studies: Ct Abdomen Pelvis W Contrast  06/07/2012  *RADIOLOGY REPORT*  Clinical Data: Left lower quadrant pain.  Rectal bleeding.  Nausea. The ulcer colitis.  Personal history of bilateral breast carcinoma.  CT ABDOMEN AND PELVIS WITH CONTRAST  Technique:  Multidetector CT imaging of the abdomen and pelvis was performed following the standard protocol during bolus administration of intravenous contrast.  Contrast: 12m OMNIPAQUE IOHEXOL 300 MG/ML  SOLN  Comparison: None.  Findings: Mild colonic wall thickening and loss of normal haustral fold pattern is seen involving the splenic flexure, descending, and rectosigmoid  colon.  This is consistent with the patient's history of ulcerative colitis.  There is no evidence of terminal ileum or other small bowel involvement.  No evidence of bowel obstruction. No evidence of abscess or free fluid.  Uterus and adnexa are unremarkable in appearance.  The abdominal parenchymal organs are normal in appearance.  Gallbladder is unremarkable.  No soft tissue masses or lymphadenopathy identified within the abdomen or pelvis.  IMPRESSION:  1.  Mild colitis involving the splenic flexure, descending, and rectosigmoid colon, consistent with known history of ulcerative colitis. 2.  No evidence of complication or other significant abnormality.   Original Report Authenticated By: JMarlaine Hind M.D.     Microbiology: Recent Results (from the past 240 hour(s))  URINE CULTURE     Status: Normal   Collection Time   06/07/12  9:16 AM      Component Value Range Status Comment   Specimen Description URINE, CLEAN CATCH   Final    Special Requests NONE   Final    Culture  Setup Time 06/08/2012 01:22   Final    Colony Count NO GROWTH   Final    Culture NO GROWTH   Final    Report Status 06/08/2012 FINAL   Final   CULTURE, BLOOD (ROUTINE X 2)     Status: Normal (Preliminary result)   Collection Time   06/07/12  9:25 AM      Component Value Range Status Comment   Specimen Description BLOOD LEFT ANTECUBITAL DRAWN BY RN   Final    Special Requests     Final    Value: BOTTLES DRAWN AEROBIC AND ANAEROBIC AEB=6CC ANA=4CC   Culture NO GROWTH 3 DAYS   Final    Report Status PENDING   Incomplete   CULTURE, BLOOD (ROUTINE X 2)     Status: Normal (Preliminary result)   Collection Time   06/07/12  9:26 AM      Component Value Range Status Comment   Specimen Description BLOOD LEFT HAND   Final    Special Requests BOTTLES DRAWN AEROBIC ONLY [place]  Final    Culture NO GROWTH 3 DAYS   Final    Report Status PENDING   Incomplete      Labs: Basic Metabolic Panel:  Lab [id number] 06/07/12 0924    NA 135  131*  K 4.0 4.1  CL 102 95*  CO2 24 27  GLUCOSE 138* 122*  BUN 12 19  CREATININE 0.74 0.87  CALCIUM 6.9* 7.8*  MG -- --  PHOS -- --   Liver Function Tests:  Lab 06/08/12 0533 06/07/12 0924  AST 9 13  ALT 11 15  ALKPHOS 29* 34*  BILITOT 0.2* 0.4  PROT 5.5* 6.1  ALBUMIN 2.1* 2.6*   No results found for this basename: LIPASE:5,AMYLASE:5 in the last 168 hours No results found for this basename: AMMONIA:5 in the last 168 hours CBC:  Lab 06/10/12 0521 06/09/12 0848 06/08/12 0533 06/07/12 0924  WBC 6.6 -- 5.2 8.4  NEUTROABS -- -- -- 6.7  HGB 9.1* 9.9* 9.9* 11.6*  HCT 27.2* 29.6* 29.6* 34.5*  MCV 91.9 -- 93.1 91.8  PLT 308 -- 312 408*   Cardiac Enzymes: No results found for this basename: CKTOTAL:5,CKMB:5,CKMBINDEX:5,TROPONINI:5 in the last 168 hours BNP: BNP (last 3 results) No results found for this basename: PROBNP:3 in the last 8760 hours CBG: No results found for this basename: GLUCAP:5 in the last 168 hours     Signed:  Ruble Buttler  [facility] 06/10/2012, 4:21 PM

## 2012-06-10 NOTE — Progress Notes (Signed)
Pt discharged home via private vehicle driven by friend. Discharge instructions and meds reviewed with pt with good understanding. Currently, pt voices no c/o pain or discomfort.

## 2012-06-10 NOTE — Progress Notes (Signed)
Patient ID: Monique Day, female   DOB: 1936-01-19, 76 y.o.   MRN: [id number] S. Feels better. She has not had a BM. She is still passing small amount of blood. Ate most of breakfast. Feels 50% better. Slept better last night. No abdominal pain. Stool studies are pending O:  Filed Vitals:   06/09/12 0538 06/09/12 1450 06/09/12 2053 06/10/12 0359  BP: 98/62 145/69 143/71 113/65  Pulse: 85 81 83 70  Temp: 98.1 F (36.7 C) 98.4 F (36.9 C) 98 F (36.7 C) 98 F (36.7 C)  TempSrc:  Oral Oral Oral  Resp: 20 18 18 18   Height:      Weight:      SpO2: 97% 96% 97% 93%   A. UC flare. Stool studies are pending. She has not had a BM as of yet. Plan: Continue to monitor. She will have OV in 1-2 weeks. TPMT pending. PT/INR down 35.1 and 3.78

## 2012-06-12 LAB — CULTURE, BLOOD (ROUTINE X 2)
Culture: NO GROWTH
Culture: NO GROWTH

## 2012-06-23 ENCOUNTER — Telehealth (INDEPENDENT_AMBULATORY_CARE_PROVIDER_SITE_OTHER): Payer: Self-pay | Admitting: *Deleted

## 2012-06-23 DIAGNOSIS — D649 Anemia, unspecified: Secondary | ICD-10-CM

## 2012-06-23 DIAGNOSIS — K5190 Ulcerative colitis, unspecified, without complications: Secondary | ICD-10-CM

## 2012-06-23 NOTE — Telephone Encounter (Signed)
Per Dr.Rehman on 06/22/12 send a request to Dr.Butler's office, patient will go there on 06-25-12.

## 2012-06-28 ENCOUNTER — Ambulatory Visit (INDEPENDENT_AMBULATORY_CARE_PROVIDER_SITE_OTHER): Payer: Medicare Other | Admitting: Internal Medicine

## 2012-06-28 ENCOUNTER — Encounter (INDEPENDENT_AMBULATORY_CARE_PROVIDER_SITE_OTHER): Payer: Self-pay | Admitting: Internal Medicine

## 2012-06-28 VITALS — BP 100/70 | HR 72 | Temp 99.0°F | Resp 18 | Ht 63.0 in | Wt 149.9 lb

## 2012-06-28 DIAGNOSIS — D649 Anemia, unspecified: Secondary | ICD-10-CM

## 2012-06-28 DIAGNOSIS — K5190 Ulcerative colitis, unspecified, without complications: Secondary | ICD-10-CM

## 2012-06-28 DIAGNOSIS — D72819 Decreased white blood cell count, unspecified: Secondary | ICD-10-CM | POA: Insufficient documentation

## 2012-06-28 DIAGNOSIS — Z139 Encounter for screening, unspecified: Secondary | ICD-10-CM

## 2012-06-28 NOTE — Patient Instructions (Signed)
Continue to hold 6 mercaptopurine. To have CBC with differential, hepatitis B surface antigen and PPD or TB test and Dr. Carmie End office tomorrow. Will plan blood transfusion as soon as white cell count is up.

## 2012-06-28 NOTE — Progress Notes (Signed)
Presenting complaint;  Followup for ulcerative colitis and anemia.  Subjective:  Patient is 76 year old Caucasian female who is at ulcerative colitis for several years was admitted to [facility] 06/07/2012 2 06/10/2012. She was having bloody diarrhea. She was begun on prednisone. Treatment options were reviewed with the patient and she was begun on 6-MP. TPMT enzyme activity was normal. She had planned CBC on 06/25/2012 when her WBC was low and she was advised by Dr. Scotty Court to stop 6-MP. Dr. Scotty Court did confer with Dr. Oneida Alar who was covering for me over the week. Patient denies fever or chills. She feels very weak and has no energy. She continues to have diarrhea and rectal bleeding. She generally has 4-5 bowel movements each day and they occur between midnight and 5 AM. At times she passes flatus mucus and blood. Only on one occasion she did not see blood with her bowel movements. Her appetite is poor. She has lost 3 pounds since she left the hospital. She also complains of pain at left lower quadrant of her abdomen which is not relieved with defecation. She denies nausea or vomiting.      Current Medications: Current Outpatient Prescriptions  Medication Sig Dispense Refill  . Calcium Carb-Cholecalciferol (CALCIUM 1000 + D PO) Take 3 tablets by mouth daily. Takes one tablet in the morning and two tablets in the evening.      . Cholecalciferol (VITAMIN D3) 2000 UNITS TABS Take 4,000 Units by mouth daily. Take 1 every morning and 1 at night       . denosumab (PROLIA) 60 MG/ML SOLN injection Inject 60 mg into the skin every 6 (six) months. Administer in upper arm, thigh, or abdomen      . docusate sodium (COLACE) 100 MG capsule Take 1 capsule (100 mg total) by mouth 2 (two) times daily as needed for constipation. Stool softener.  10 capsule  0  . Mesalamine (ASACOL HD) 800 MG TBEC Take 1,600 mg by mouth 2 (two) times daily.      . predniSONE (DELTASONE) 20 MG tablet Take 40 mg daily  as directed by Dr. Laural Golden.      . Simethicone (GAS-X PO) Take 2 tablets by mouth. Patient takes as needed      . warfarin (COUMADIN) 5 MG tablet Do not restart warfarin until 06/12/2012. When restarting it, take only 2.5 mg (or half a tablet) daily until you are reevaluated by your primary care physician. At that time, if there is no further rectal bleeding, you can resume the previous dosing under the guidance of Dr. Melina Copa.         Objective: Blood pressure 100/70, pulse 72, temperature 99 F (37.2 C), temperature source Oral, resp. rate 18, height 5' 3"  (1.6 m), weight 149 lb 14.4 oz (67.994 kg). Patient is alert. She is in no acute distress. Conjunctiva is pale. Sclera is nonicteric Oropharyngeal mucosa is normal. No neck masses or thyromegaly noted. Cardiac exam with regular rhythm normal S1 and S2. No murmur or gallop noted. Lungs are clear to auscultation. Abdomen is symmetrical. Bowel sounds are normal. On palpation abdomen is soft with mild tenderness at LLQ. No organomegaly or masses noted. No LE edema or clubbing noted.  Labs/studies Results: CBC from 06/25/2012. WBC to 0.6, H&H 25.4 and 8.3, platelet count 182K. Neutrophils 40%.   Assessment: #1. Neutropenia secondary to 6 MP. She has developed neutropenia after 15 doses. Her TPMT enzyme assay was within normal limits. She is afebrile and does not appear  to be acutely ill. Half-life of 6 MP is less than 2 hours; therefore she must be clear this medication rapidly. #2. Anemia secondary to blood loss from GI tract. #3. Ulcerated colitis. There has been no symptomatic improvement since he was discharged from [facility] on 06/10/2012. It is too early to consider 6 MP use of failure as she has been on this medication for 2 weeks only. Unless he agrees to proceed with infliximab infusion we could use 6-MP at a lesser dose and closer monitoring. May also consider flexible sigmoidoscopy with biopsy to make sure it's UC without  CMV colitis.   Plan:  She'll have CBC, hepatitis B surface antigen and PPD at Dr. Carmie End office at the time of her office visit tomorrow. Patient will continue to hold 6 MP for now. As soon as her neutropenia has improved will arrange PRBC transfusion.

## 2012-06-30 ENCOUNTER — Encounter (HOSPITAL_COMMUNITY): Payer: Medicare Other

## 2012-06-30 ENCOUNTER — Encounter (HOSPITAL_COMMUNITY): Payer: Medicare Other | Attending: Internal Medicine

## 2012-06-30 DIAGNOSIS — D649 Anemia, unspecified: Secondary | ICD-10-CM | POA: Insufficient documentation

## 2012-06-30 LAB — ABO/RH: ABO/RH(D): A POS

## 2012-06-30 LAB — PREPARE RBC (CROSSMATCH)

## 2012-06-30 NOTE — Progress Notes (Signed)
Monique Day's reason for visit today are for labs as scheduled per MD orders.  Venipuncture performed with a 23 gauge butterfly needle to L Antecubital.  Monique Day tolerated venipuncture well and without incident; questions were answered and patient was discharged.

## 2012-07-01 ENCOUNTER — Encounter (HOSPITAL_COMMUNITY): Payer: Medicare Other

## 2012-07-01 VITALS — BP 122/63 | HR 95 | Temp 98.4°F | Resp 16

## 2012-07-01 DIAGNOSIS — D649 Anemia, unspecified: Secondary | ICD-10-CM

## 2012-07-01 MED ORDER — SODIUM CHLORIDE 0.9 % IV SOLN
250.0000 mL | Freq: Once | INTRAVENOUS | Status: AC
  Administered 2012-07-01: 250 mL via INTRAVENOUS

## 2012-07-01 NOTE — Progress Notes (Signed)
Transfusion complete. Patient tolerated well.

## 2012-07-01 NOTE — Progress Notes (Deleted)
Monique Day presents today for injection per the provider's orders.  Prolia administered administration without incident; see MAR for injection details.  Patient tolerated procedure well and without incident.  No questions or complaints noted at this time.

## 2012-07-02 LAB — TYPE AND SCREEN
ABO/RH(D): A POS
Antibody Screen: NEGATIVE
Unit division: 0
Unit division: 0

## 2012-07-05 LAB — MISCELLANEOUS TEST

## 2012-07-06 ENCOUNTER — Encounter (INDEPENDENT_AMBULATORY_CARE_PROVIDER_SITE_OTHER): Payer: Self-pay

## 2012-07-07 ENCOUNTER — Telehealth (INDEPENDENT_AMBULATORY_CARE_PROVIDER_SITE_OTHER): Payer: Self-pay | Admitting: *Deleted

## 2012-07-07 NOTE — Telephone Encounter (Signed)
Per Dr.Rehman call in Prednisone 10 mg The patient is to take 3 by mouth as instructed This was called to [facility] was going to let the patient know

## 2012-07-12 ENCOUNTER — Inpatient Hospital Stay (HOSPITAL_COMMUNITY)
Admission: EM | Admit: 2012-07-12 | Discharge: 2012-07-14 | DRG: 357 | Disposition: A | Discharge status: Home / Self Care | Payer: Medicare Other | Attending: Internal Medicine | Admitting: Internal Medicine

## 2012-07-12 ENCOUNTER — Other Ambulatory Visit: Payer: Self-pay

## 2012-07-12 ENCOUNTER — Emergency Department (HOSPITAL_COMMUNITY): Payer: Medicare Other

## 2012-07-12 ENCOUNTER — Encounter (HOSPITAL_COMMUNITY): Payer: Self-pay | Admitting: Emergency Medicine

## 2012-07-12 DIAGNOSIS — R578 Other shock: Secondary | ICD-10-CM

## 2012-07-12 DIAGNOSIS — T45515A Adverse effect of anticoagulants, initial encounter: Secondary | ICD-10-CM | POA: Diagnosis present

## 2012-07-12 DIAGNOSIS — K921 Melena: Secondary | ICD-10-CM

## 2012-07-12 DIAGNOSIS — Z9181 History of falling: Secondary | ICD-10-CM

## 2012-07-12 DIAGNOSIS — D62 Acute posthemorrhagic anemia: Secondary | ICD-10-CM | POA: Diagnosis present

## 2012-07-12 DIAGNOSIS — K5180 Other ulcerative colitis without complications: Principal | ICD-10-CM | POA: Diagnosis present

## 2012-07-12 DIAGNOSIS — Z79899 Other long term (current) drug therapy: Secondary | ICD-10-CM

## 2012-07-12 DIAGNOSIS — Z7901 Long term (current) use of anticoagulants: Secondary | ICD-10-CM

## 2012-07-12 DIAGNOSIS — R739 Hyperglycemia, unspecified: Secondary | ICD-10-CM

## 2012-07-12 DIAGNOSIS — R791 Abnormal coagulation profile: Secondary | ICD-10-CM | POA: Diagnosis present

## 2012-07-12 DIAGNOSIS — D649 Anemia, unspecified: Secondary | ICD-10-CM

## 2012-07-12 DIAGNOSIS — K5120 Ulcerative (chronic) proctitis without complications: Secondary | ICD-10-CM | POA: Diagnosis present

## 2012-07-12 DIAGNOSIS — Z87891 Personal history of nicotine dependence: Secondary | ICD-10-CM

## 2012-07-12 DIAGNOSIS — Z23 Encounter for immunization: Secondary | ICD-10-CM

## 2012-07-12 DIAGNOSIS — I959 Hypotension, unspecified: Secondary | ICD-10-CM | POA: Diagnosis present

## 2012-07-12 DIAGNOSIS — R569 Unspecified convulsions: Secondary | ICD-10-CM | POA: Diagnosis present

## 2012-07-12 DIAGNOSIS — E876 Hypokalemia: Secondary | ICD-10-CM | POA: Diagnosis not present

## 2012-07-12 DIAGNOSIS — D72829 Elevated white blood cell count, unspecified: Secondary | ICD-10-CM | POA: Diagnosis present

## 2012-07-12 DIAGNOSIS — D6832 Hemorrhagic disorder due to extrinsic circulating anticoagulants: Secondary | ICD-10-CM

## 2012-07-12 DIAGNOSIS — Z8673 Personal history of transient ischemic attack (TIA), and cerebral infarction without residual deficits: Secondary | ICD-10-CM

## 2012-07-12 DIAGNOSIS — E860 Dehydration: Secondary | ICD-10-CM

## 2012-07-12 DIAGNOSIS — Z86718 Personal history of other venous thrombosis and embolism: Secondary | ICD-10-CM

## 2012-07-12 DIAGNOSIS — Z853 Personal history of malignant neoplasm of breast: Secondary | ICD-10-CM

## 2012-07-12 DIAGNOSIS — I82409 Acute embolism and thrombosis of unspecified deep veins of unspecified lower extremity: Secondary | ICD-10-CM | POA: Diagnosis present

## 2012-07-12 DIAGNOSIS — E871 Hypo-osmolality and hyponatremia: Secondary | ICD-10-CM | POA: Diagnosis present

## 2012-07-12 DIAGNOSIS — D689 Coagulation defect, unspecified: Secondary | ICD-10-CM

## 2012-07-12 DIAGNOSIS — R55 Syncope and collapse: Secondary | ICD-10-CM | POA: Diagnosis present

## 2012-07-12 DIAGNOSIS — R7309 Other abnormal glucose: Secondary | ICD-10-CM | POA: Diagnosis present

## 2012-07-12 LAB — CBC WITH DIFFERENTIAL/PLATELET
Basophils Absolute: 0 10*3/uL (ref 0.0–0.1)
Basophils Relative: 0 % (ref 0–1)
Eosinophils Absolute: 0 10*3/uL (ref 0.0–0.7)
Eosinophils Relative: 0 % (ref 0–5)
HCT: 24.7 % — ABNORMAL LOW (ref 36.0–46.0)
Hemoglobin: 8.6 g/dL — ABNORMAL LOW (ref 12.0–15.0)
Lymphocytes Relative: 10 % — ABNORMAL LOW (ref 12–46)
Lymphs Abs: 1.4 10*3/uL (ref 0.7–4.0)
MCH: 31.3 pg (ref 26.0–34.0)
MCHC: 34.8 g/dL (ref 30.0–36.0)
MCV: 89.8 fL (ref 78.0–100.0)
Monocytes Absolute: 1.1 10*3/uL — ABNORMAL HIGH (ref 0.1–1.0)
Monocytes Relative: 9 % (ref 3–12)
Neutro Abs: 10.6 10*3/uL — ABNORMAL HIGH (ref 1.7–7.7)
Neutrophils Relative %: 81 % — ABNORMAL HIGH (ref 43–77)
Platelets: 737 10*3/uL — ABNORMAL HIGH (ref 150–400)
RBC: 2.75 MIL/uL — ABNORMAL LOW (ref 3.87–5.11)
RDW: 15.8 % — ABNORMAL HIGH (ref 11.5–15.5)
Smear Review: INCREASED
WBC Morphology: INCREASED
WBC: 13.1 10*3/uL — ABNORMAL HIGH (ref 4.0–10.5)

## 2012-07-12 LAB — APTT: aPTT: 65 seconds — ABNORMAL HIGH (ref 24–37)

## 2012-07-12 LAB — CBC
HCT: 20.3 % — ABNORMAL LOW (ref 36.0–46.0)
HCT: 24.4 % — ABNORMAL LOW (ref 36.0–46.0)
Hemoglobin: 6.8 g/dL — CL (ref 12.0–15.0)
Hemoglobin: 8.5 g/dL — ABNORMAL LOW (ref 12.0–15.0)
MCH: 30.5 pg (ref 26.0–34.0)
MCH: 30.9 pg (ref 26.0–34.0)
MCHC: 33.5 g/dL (ref 30.0–36.0)
MCHC: 34.8 g/dL (ref 30.0–36.0)
MCV: 91 fL (ref 78.0–100.0)
MCV: 88.7 fL (ref 78.0–100.0)
Platelets: 500 10*3/uL — ABNORMAL HIGH (ref 150–400)
Platelets: 218 10*3/uL (ref 150–400)
RBC: 2.23 MIL/uL — ABNORMAL LOW (ref 3.87–5.11)
RBC: 2.75 MIL/uL — ABNORMAL LOW (ref 3.87–5.11)
RDW: 16 % — ABNORMAL HIGH (ref 11.5–15.5)
RDW: 15.3 % (ref 11.5–15.5)
WBC: 7.6 10*3/uL (ref 4.0–10.5)
WBC: 5.3 10*3/uL (ref 4.0–10.5)

## 2012-07-12 LAB — GLUCOSE, CAPILLARY: Glucose-Capillary: 101 mg/dL — ABNORMAL HIGH (ref 70–99)

## 2012-07-12 LAB — BASIC METABOLIC PANEL
BUN: 34 mg/dL — ABNORMAL HIGH (ref 6–23)
CO2: 16 mEq/L — ABNORMAL LOW (ref 19–32)
Calcium: 7.6 mg/dL — ABNORMAL LOW (ref 8.4–10.5)
Chloride: 96 mEq/L (ref 96–112)
Creatinine, Ser: 1.1 mg/dL (ref 0.50–1.10)
GFR calc Af Amer: 56 mL/min — ABNORMAL LOW (ref >90)
GFR calc non Af Amer: 48 mL/min — ABNORMAL LOW (ref >90)
Glucose, Bld: 218 mg/dL — ABNORMAL HIGH (ref 70–99)
Potassium: 3.7 mEq/L (ref 3.5–5.1)
Sodium: 129 mEq/L — ABNORMAL LOW (ref 135–145)

## 2012-07-12 LAB — PROTIME-INR
INR: 7.87 (ref 0.00–1.49)
Prothrombin Time: 60.4 seconds — ABNORMAL HIGH (ref 11.6–15.2)

## 2012-07-12 LAB — PREPARE RBC (CROSSMATCH)

## 2012-07-12 LAB — MRSA PCR SCREENING: MRSA by PCR: NEGATIVE

## 2012-07-12 MED ORDER — METHYLPREDNISOLONE SODIUM SUCC 40 MG IJ SOLR
40.0000 mg | Freq: Every day | INTRAMUSCULAR | Status: DC
  Administered 2012-07-12 – 2012-07-14 (×3): 40 mg via INTRAVENOUS
  Filled 2012-07-12 (×3): qty 1

## 2012-07-12 MED ORDER — SODIUM CHLORIDE 0.9 % IJ SOLN
10.0000 mL | INTRAMUSCULAR | Status: DC | PRN
  Administered 2012-07-12 – 2012-07-13 (×2): 10 mL via INTRAVENOUS

## 2012-07-12 MED ORDER — MORPHINE SULFATE 2 MG/ML IJ SOLN: 2.0000 mg | INTRAMUSCULAR | Status: DC | PRN

## 2012-07-12 MED ORDER — SODIUM CHLORIDE 0.9 % IV BOLUS (SEPSIS)
1000.0000 mL | Freq: Once | INTRAVENOUS | Status: AC
  Administered 2012-07-12: 1000 mL via INTRAVENOUS

## 2012-07-12 MED ORDER — PANTOPRAZOLE SODIUM 40 MG IV SOLR
40.0000 mg | INTRAVENOUS | Status: DC
  Administered 2012-07-12 – 2012-07-14 (×3): 40 mg via INTRAVENOUS
  Filled 2012-07-12 (×3): qty 40

## 2012-07-12 MED ORDER — ZOLPIDEM TARTRATE 5 MG PO TABS: 5.0000 mg | ORAL_TABLET | Freq: Every evening | ORAL | Status: DC | PRN

## 2012-07-12 MED ORDER — SODIUM CHLORIDE 0.9 % IJ SOLN
3.0000 mL | Freq: Two times a day (BID) | INTRAMUSCULAR | Status: DC
  Administered 2012-07-12: 3 mL via INTRAVENOUS

## 2012-07-12 MED ORDER — VITAMIN K1 10 MG/ML IJ SOLN
INTRAMUSCULAR | Status: AC
  Filled 2012-07-12: qty 1

## 2012-07-12 MED ORDER — ALUM & MAG HYDROXIDE-SIMETH 200-200-20 MG/5ML PO SUSP: 30.0000 mL | Freq: Four times a day (QID) | ORAL | Status: DC | PRN

## 2012-07-12 MED ORDER — INFLUENZA VIRUS VACC SPLIT PF IM SUSP
0.5000 mL | Freq: Once | INTRAMUSCULAR | Status: DC
  Filled 2012-07-12: qty 0.5

## 2012-07-12 MED ORDER — ACETAMINOPHEN 650 MG RE SUPP: 650.0000 mg | Freq: Four times a day (QID) | RECTAL | Status: DC | PRN

## 2012-07-12 MED ORDER — ACETAMINOPHEN 325 MG PO TABS: 650.0000 mg | ORAL_TABLET | Freq: Four times a day (QID) | ORAL | Status: DC | PRN

## 2012-07-12 MED ORDER — ONDANSETRON HCL 4 MG/2ML IJ SOLN: 4.0000 mg | Freq: Four times a day (QID) | INTRAMUSCULAR | Status: DC | PRN

## 2012-07-12 MED ORDER — VITAMIN K1 10 MG/ML IJ SOLN
10.0000 mg | Freq: Once | INTRAVENOUS | Status: AC
  Administered 2012-07-12: 10 mg via INTRAVENOUS
  Filled 2012-07-12: qty 1

## 2012-07-12 MED ORDER — ONDANSETRON HCL 4 MG PO TABS: 4.0000 mg | ORAL_TABLET | Freq: Four times a day (QID) | ORAL | Status: DC | PRN

## 2012-07-12 MED ORDER — PNEUMOCOCCAL VAC POLYVALENT 25 MCG/0.5ML IJ INJ
0.5000 mL | INJECTION | INTRAMUSCULAR | Status: AC
  Administered 2012-07-13: 0.5 mL via INTRAMUSCULAR
  Filled 2012-07-12: qty 0.5

## 2012-07-12 MED ORDER — SODIUM CHLORIDE 0.9 % IV SOLN
INTRAVENOUS | Status: DC
  Administered 2012-07-12 – 2012-07-13 (×2): via INTRAVENOUS

## 2012-07-12 MED ORDER — INFLUENZA VIRUS VACC SPLIT PF IM SUSP
0.5000 mL | INTRAMUSCULAR | Status: AC
  Administered 2012-07-13: 0.5 mL via INTRAMUSCULAR
  Filled 2012-07-12: qty 0.5

## 2012-07-12 NOTE — Progress Notes (Signed)
This very pleasant 76 year old lady was admitted a few hours ago by Dr. Hilma Favors with rectal bleeding associated with hemodynamic instability and INR of greater than 8 (patient has been on Coumadin). When I saw her this morning, she was hemodynamically stable, alert, did not look pale and her peripheries were not clammy. She has had no further bleeding since she was seen by Dr. Hilma Favors. On closer questioning, her first left leg DVT was in 2000 and this was associated with a long (approximately 6-8 hours) bus ride. She was then started on anticoagulation with Coumadin. She then apparently had another DVT, on Coumadin, and this was not associated with immobility.  Plan: 1. Continue resuscitative measures, patient is currently receiving FFP, has already had vitamin K and is due to receive blood. 2. Discontinue Coumadin indefinitely. 3. IVC filter when INR improves and patient is more stable. 4. Appreciate gastroenterology input.

## 2012-07-12 NOTE — ED Notes (Signed)
Pt reports LOC aprox 8 pm.  Reports recent GI bleeds. Pt pale and cool to touch.  Orient x4 at this time.  Pt reports dizziness upon being in upright position.

## 2012-07-12 NOTE — Care Management Note (Signed)
    Page 1 of 1   07/14/2012     12:01:30 PM   CARE MANAGEMENT NOTE 07/14/2012  Patient:  Monique Day, Monique Day   Account Number:  [id number]  Date Initiated:  07/12/2012  Documentation initiated by:  Claretha Cooper  Subjective/Objective Assessment:   Pt lives alone and relies on neighbors and church friends for support. HH declined. No needs identifed     Action/Plan:   Anticipated DC Date:  07/14/2012   Anticipated DC Plan:  [place]  CM consult      Choice offered to / List presented to:             Status of service:  Completed, signed off Medicare Important Message given?   (If response is "NO", the following Medicare IM given date fields will be blank) Date Medicare IM given:   Date Additional Medicare IM given:    Discharge Disposition:  HOME/SELF CARE  Per UR Regulation:    If discussed at Long Length of Stay Meetings, dates discussed:    Comments:  07/12/12 [place]

## 2012-07-12 NOTE — Plan of Care (Signed)
Problem: Consults Goal: GI Bleeding Patient Education See Patient Education Module for education specifics. Outcome: Progressing Patient with history of GI bleed. Got 2 units of blood last week.  Passing clots in stool.

## 2012-07-12 NOTE — ED Provider Notes (Signed)
History     CSN: [id number]  Arrival date & time 07/12/12  0258   First MD Initiated Contact with Patient 07/12/12 [phone]      Chief Complaint  Patient presents with  . Fall    (Consider location/radiation/quality/duration/timing/severity/associated sxs/prior treatment) HPI Comments: 76 year old female with a history of ulcerative colitis who required a recent blood transfusion secondary to excessive blood loss in her stool. She states that she has had ulcerative colitis for many many years but has not required blood transfusion until this last week. On Thursday the patient developed increased volume of bright red blood in her stools and states that it has been gradually getting worse and today had become severe with 2 large movements full of bright red blood. She has also recently been on 40 mg of prednisone, this was reduced to 30 mg and prednisone but after the last several days of bloody bowel movement she will increase the dose back to 40 mg. This evening after having a large bloody bowel movement the patient became lightheaded and not returning to the bathroom the patient had a syncopal episode causing her to fall and strike her left ribs on the ground. When she came to she was on the ground, it took her approximately 4 hours to get to a phone to call for help. When she was found by paramedics she was hypotensive, tachycardic with a blood pressure of 80 palp. At this time the patient states that she is feeling better but needs to lay supine otherwise become symptomatic.  She denies that she has had any fevers, chills, nausea, vomiting, back pain, swelling, rashes, blurred vision.  Patient is a 76 y.o. female presenting with fall. The history is provided by the patient and medical records.  Fall    Past Medical History  Diagnosis Date  . UC (ulcerative colitis)   . Lower abdominal pain   . Occult blood in stools   . Hemorrhoids, external   . HX: breast cancer   . DVT (deep venous  thrombosis)   . Seizures   . Stroke     Past Surgical History  Procedure Date  . Breast lumpectomy   . Colonoscopy 02/21/2011  . Colonoscopy 10/06/2008  . Colonoscopy 12/27/01  . Colonoscopy 05/09/05  . Bone density 12/11/10    Family History  Problem Relation Age of Onset  . Prostate cancer Father     History  Substance Use Topics  . Smoking status: Former Smoker    Types: Cigarettes    Quit date: 05/05/2001  . Smokeless tobacco: Never Used  . Alcohol Use: No    OB History    Grav Para Term Preterm Abortions TAB SAB Ect Mult Living                  Review of Systems  All other systems reviewed and are negative.    Allergies  Sulfa antibiotics  Home Medications   Current Outpatient Rx  Name  Route  Sig  Dispense  Refill  . CALCIUM 1000 + D PO   Oral   Take 3 tablets by mouth daily. Takes one tablet in the morning and two tablets in the evening.         Marland Kitchen VITAMIN D3 2000 UNITS PO TABS   Oral   Take 4,000 Units by mouth daily. Take 1 every morning and 1 at night          . DENOSUMAB 60 MG/ML  SOLN   Subcutaneous  Inject 60 mg into the skin every 6 (six) months. Administer in upper arm, thigh, or abdomen         . DOCUSATE SODIUM 100 MG PO CAPS   Oral   Take 1 capsule (100 mg total) by mouth 2 (two) times daily as needed for constipation. Stool softener.   10 capsule   0   . MESALAMINE 800 MG PO TBEC   Oral   Take 1,600 mg by mouth 2 (two) times daily.         Marland Kitchen PREDNISONE 20 MG PO TABS      Take 40 mg daily as directed by Dr. Laural Golden.         Marland Kitchen GAS-X PO   Oral   Take 2 tablets by mouth. Patient takes as needed         . WARFARIN SODIUM 5 MG PO TABS      Do not restart warfarin until 06/12/2012. When restarting it, take only 2.5 mg (or half a tablet) daily until you are reevaluated by your primary care physician. At that time, if there is no further rectal bleeding, you can resume the previous dosing under the guidance of Dr.  Melina Copa.           BP 102/63  Pulse 100  Temp 98.1 F (36.7 C) (Oral)  Resp 20  Ht 5' 3"  (1.6 m)  Wt 150 lb (68.04 kg)  BMI 26.57 kg/m2  SpO2 99%  Physical Exam  Nursing note and vitals reviewed. Constitutional: She appears well-developed and well-nourished. No distress.  HENT:  Head: Normocephalic and atraumatic.  Mouth/Throat: Oropharynx is clear and moist. No oropharyngeal exudate.  Eyes: EOM are normal. Pupils are equal, round, and reactive to light. Right eye exhibits no discharge. Left eye exhibits no discharge. No scleral icterus.       Conjunctivae mildly pale  Neck: Normal range of motion. Neck supple. No JVD present. No thyromegaly present.  Cardiovascular: Regular rhythm, normal heart sounds and intact distal pulses.  Exam reveals no gallop and no friction rub.   No murmur heard.      Tachycardia present, weak pulses at the radial arteries bilaterally  Pulmonary/Chest: Effort normal and breath sounds normal. No respiratory distress. She has no wheezes. She has no rales. She exhibits tenderness (mild tenderness to palpation over the left anterior lateral chest wall).  Abdominal: Soft. Bowel sounds are normal. She exhibits no distension and no mass. There is no tenderness.       Nontender abdomen  Genitourinary:       Copious amounts of bright red blood in the rectal vault, no fissure or hemorrhoids  Musculoskeletal: Normal range of motion. She exhibits no edema and no tenderness.  Lymphadenopathy:    She has no cervical adenopathy.  Neurological: She is alert. Coordination normal.       Alert and oriented, follows commands, speech is clear and organized  Skin: Skin is warm and dry. No rash noted. No erythema. There is pallor.  Psychiatric: She has a normal mood and affect. Her behavior is normal.    ED Course  Procedures (including critical care time)  Labs Reviewed  CBC WITH DIFFERENTIAL - Abnormal; Notable for the following:    WBC 13.1 (*)     RBC 2.75 (*)      Hemoglobin 8.6 (*)     HCT 24.7 (*)     RDW 15.8 (*)     Platelets 737 (*)     Neutrophils Relative  81 (*)     Neutro Abs 10.6 (*)     Lymphocytes Relative 10 (*)     Monocytes Absolute 1.1 (*)     All other components within normal limits  BASIC METABOLIC PANEL - Abnormal; Notable for the following:    Sodium 129 (*)     CO2 16 (*)     Glucose, Bld 218 (*)     BUN 34 (*)     Calcium 7.6 (*)     GFR calc non Af Amer 48 (*)     GFR calc Af Amer 56 (*)     All other components within normal limits  APTT - Abnormal; Notable for the following:    aPTT 65 (*)     All other components within normal limits  PROTIME-INR - Abnormal; Notable for the following:    Prothrombin Time 60.4 (*)  RESULT REPEATED AND VERIFIED   INR 7.87 (*)     All other components within normal limits  TYPE AND SCREEN  PREPARE RBC (CROSSMATCH)  PREPARE FRESH FROZEN PLASMA   No results found.   1. Hemorrhagic shock   2. Anemia   3. Coagulopathy       MDM  The patient appears ill with a likely hypotension and tachycardia resulting from anemia. Patient will be evaluated with lab work, type and screen, rectal exam, IV fluids. The syncopal episode was likely related to her blood loss, she may require transfusion. According to the medical record the patient is on Coumadin as well, which is likely related to her history of DVT. Coagulation panel pending  ED ECG REPORT  I personally interpreted this EKG   Date: 07/12/2012   Rate: 119  Rhythm: sinus tachycardia  QRS Axis: normal  Intervals: normal  ST/T Wave abnormalities: nonspecific ST/T changes  Conduction Disutrbances:none  Narrative Interpretation:   Old EKG Reviewed: none available  The patient has been evaluated and found to have a brisk : Bleed likely secondary to her ulcerative colitis and coagulopathy. According to the blood work she has a hemoglobin of 8 which she states has dropped from a level of 12 after her recent transfusion. Her INR  is close to 8 as well. Because of this significant coagulopathy I have ordered emergency transfusion with type and crossed red blood cells and FFP. Vitamin K has also been ordered. She has an IV with IV fluids running wide open due to her hypotension. Her blood pressure is currently 90/50, pulse 120. She is likely in shock related to acute blood loss anemia. I will admit her to the hospitalist, the patient likely needs an intensive care unit.  CRITICAL CARE Performed by: Johnna Acosta   Total critical care time: 35  Critical care time was exclusive of separately billable procedures and treating other patients.  Critical care was necessary to treat or prevent imminent or life-threatening deterioration.  Critical care was time spent personally by me on the following activities: development of treatment plan with patient and/or surrogate as well as nursing, discussions with consultants, evaluation of patient's response to treatment, examination of patient, obtaining history from patient or surrogate, ordering and performing treatments and interventions, ordering and review of laboratory studies, ordering and review of radiographic studies, pulse oximetry and re-evaluation of patient's condition.      Johnna Acosta, MD 07/12/12 [phone]

## 2012-07-12 NOTE — Progress Notes (Signed)
Patient interviewed and examined. Patient is well known to me.  She has 50 year history of ulcerative colitis. Lately she's had a relapse not responding to oral mesalamine. She also has not responded to prednisone. She developed severe neutropenia with 15 dozes off 6 MP(WBC 600) even though her TPMT activity was normal. Week before last she was given 2 units of PRBCs. Her H&H on 07/06/2012 was 12 and 36.3. She has experienced significant bleed since she has been anticoagulated. Her warfarin was held for few days feet before last. Now she presents with profuse rectal bleeding hemoglobin of 6.8 g in the setting of supratherapeutic INR. She is receiving PRBCs along with FFP she also received vitamin K. she remains on prednisone 30 mg by mouth daily. She is complaining of mild left-sided abdominal pain. She denies nausea vomiting fever or chills. Her appetite is fair. Not take any NSAIDs. When she was admitted 4 weeks ago this facility stool studies are requested but never completed. Recent PPD was negative and hepatitis B surface antigen is also negative. She is scheduled to receive first dose of infliximab in a.m. Dr. Anastasio Champion as recommended placement of IVC filter. Patient's last colonoscopy was in July 2012 when she was in remission. She had abdominopelvic CT on 06/07/2012 revealing inflammatory changes to distal half of colon. Assessment; Chronic ulcerative colitis which is refractory to treatment. Unfortunately anti-coagulation has made her bleeding worse. I doubt that we are dealing with super added issue like CMV. Her therapy until recently has been mesalamine. I agree with plans to place IVC filter and discontinuing anti-coagulation. Will delay infliximab infusion for now which can be given prior to discharge or preferably next week on an outpatient basis. In the meantime should continue with prednisone 30 mg by mouth daily when she goes home. I will be out of town starting July 13, 2012  and have asked Dr. Gala Romney to assist you with GI issues.

## 2012-07-12 NOTE — Progress Notes (Signed)
Inpatient Diabetes Program Recommendations  AACE/ADA: New Consensus Statement on Inpatient Glycemic Control (2013)  Target Ranges:  Prepandial:   less than 140 mg/dL      Peak postprandial:   less than 180 mg/dL (1-2 hours)      Critically ill patients:  140 - 180 mg/dL   Results for [name], [name] (MRN [id number]) as of 07/12/2012 14:33  Ref. Range 07/12/2012 03:37  Glucose Latest Range: 70-99 mg/dL 218 (H)    Inpatient Diabetes Program Recommendations HgbA1C: Please consider ordering an A1C.  Patient's initial lab glucose was 218 mg/dl.  Note: Patient has no documented history of diabetes.  Patient takes Prednisone 87m daily at home.  Due to elevated initial lab glucose may want to consider ordering A1C.  Will continue to follow.  Thanks, MBarnie Alderman RN, BSN, CIsabelaDiabetes Coordinator Inpatient Diabetes Program [phone]

## 2012-07-12 NOTE — H&P (Addendum)
[facility] History and Physical  Monique Day JJO:[id number] DOB: 02-03-1936 DOA: 07/12/2012  Referring physician: Charma Igo PCP: Octavio Graves, DO  Specialists: Gastroenterology, Dr. Laural Golden  Chief Complaint: Hematachezia  HPI: Monique Day is a 77 y.o. female with longstanding ulcerative colitis who for the past few months has had recurrent disease flares when weaning off steroids. 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 She required a blood transfusion 3 days ago for low Hb from lower GI blood loss. She was scheduled for Remicaid treatment today with Dr. Laural Golden but last evening had 2 large, loose bloody stools and became very weak and dizzy following those bowel movements. She actually had syncope and collapsed at home and it took her 4 hours to get to the phone to call 911 for help.  When EMS arrived she was on floor hypotensive and tachycardic.She fell on her left side and is having rib pain.  In ED she was found to have a supratherapeutic INR >8, Hb of 8.6 down from 11.6, recurrent bloody stools and hypotensive. She was aggressively hydrated and 2 units of PRBC and FFP have been ordered. [facility] called for admission.  Review of Systems  Constitutional: Positive for fever, chills, malaise/fatigue and diaphoresis.  HENT: Negative.   Eyes: Negative.   Respiratory: Negative.   Cardiovascular: Negative.   Gastrointestinal: Positive for abdominal pain, diarrhea and blood in stool.  Genitourinary: Negative.   Musculoskeletal: Positive for falls.  Skin: Negative.   Neurological: Positive for dizziness, sensory change, loss of consciousness and weakness.  Endo/Heme/Allergies: Negative.   Psychiatric/Behavioral: Negative.      Past Medical History  Diagnosis Date  . UC (ulcerative colitis)   . Lower abdominal pain   . Occult blood in stools   . Hemorrhoids, external   . HX: breast cancer     . DVT (deep venous thrombosis)   . Seizures   . Stroke    Past Surgical History  Procedure Date  . Breast lumpectomy   . Colonoscopy 02/21/2011  . Colonoscopy 10/06/2008  . Colonoscopy 12/27/01  . Colonoscopy 05/09/05  . Bone density 12/11/10   Social History:  reports that she quit smoking about 11 years ago. Her smoking use included Cigarettes. She has never used smokeless tobacco. She reports that she does not drink alcohol or use illicit drugs. Lives at home independently.  Allergies  Allergen Reactions  . Sulfa Antibiotics Other (See Comments)    Extreme Weakness    Family History  Problem Relation Age of Onset  . Prostate cancer Father     Prior to Admission medications   Medication Sig Start Date End Date Taking? Authorizing Provider  Calcium Carb-Cholecalciferol (CALCIUM 1000 + D PO) Take 3 tablets by mouth daily. Takes one tablet in the morning and two tablets in the evening.    Historical Provider, MD  Cholecalciferol (VITAMIN D3) 2000 UNITS TABS Take 4,000 Units by mouth daily. Take 1 every morning and 1 at night     Historical Provider, MD  denosumab (PROLIA) 60 MG/ML SOLN injection Inject 60 mg into the skin every 6 (six) months. Administer in upper arm, thigh, or abdomen    Historical Provider, MD  docusate sodium (COLACE) 100 MG capsule Take 1 capsule (100 mg total) by mouth 2 (two) times daily as needed for constipation. Stool softener. 06/10/12   Rexene Alberts, MD  Mesalamine (ASACOL HD) 800 MG TBEC Take 1,600  mg by mouth 2 (two) times daily.    Historical Provider, MD  predniSONE (DELTASONE) 20 MG tablet Take 40 mg daily as directed by Dr. Laural Golden. 06/10/12   Rexene Alberts, MD  Simethicone (GAS-X PO) Take 2 tablets by mouth. Patient takes as needed    Historical Provider, MD  warfarin (COUMADIN) 5 MG tablet Do not restart warfarin until 06/12/2012. When restarting it, take only 2.5 mg (or half a tablet) daily until you are reevaluated by your primary care  physician. At that time, if there is no further rectal bleeding, you can resume the previous dosing under the guidance of Dr. Melina Copa. 06/10/12   Rexene Alberts, MD   Physical Exam: Filed Vitals:   07/12/12 0357 07/12/12 0436 07/12/12 0511  BP: 102/63 102/63 102/55  Pulse: 111 100   Temp: 98.1 F (36.7 C)    TempSrc: Oral    Resp: 16 20 15   Height: 5' 3"  (1.6 m)    Weight: 68.04 kg (150 lb)    SpO2: 99%  100%     General:  Pale, lethargic, NAD, having chills, under layers of blankets  Eyes: PERRL  ENT: normal  Neck: normal  Cardiovascular: tachycardic, regular, no mrg  Respiratory: CTAB  Abdomen: soft, mild diffuse tenderness  Skin: no rashes  Musculoskeletal: normal strength, legs are symmetric, trace edema  Psychiatric: normal  Neurologic: non-focal  Labs on Admission:  Basic Metabolic Panel:  Lab 66/44/03 0337  NA 129*  K 3.7  CL 96  CO2 16*  GLUCOSE 218*  BUN 34*  CREATININE 1.10  CALCIUM 7.6*  MG --  PHOS --   CBC:  Lab 07/12/12 0337  WBC 13.1*  NEUTROABS 10.6*  HGB 8.6*  HCT 24.7*  MCV 89.8  PLT 737*    EKG: Independently reviewed. NSR.  Assessment/Plan Principal Problem:  *Anemia due to blood loss, acute Active Problems:  UC (ulcerative colitis confined to rectum)  DVT (deep venous thrombosis)  Hyponatremia  Hematochezia  Leukocytosis  Hypotension  Hyperglycemia  Supratherapeutic INR    [name] has moderate to severe acute blood loss anemia from lower GIB secondary to ulcerative colitis and supratherapuetic INR (on warfarin for DVT) that caused her to have a syncopal episode at home this evening-which could have been life-threatening, but fortunately she was able to get to the phone and call 911 where she was brought to the ED and given aggressive volume resuscitation, vitamin K and PRBCs.    Admit to Stepdown/ICU   2 Units of PRBC ordered, anticipate need for more  2 Units of FFP ordered, IV Vitamin K 74m given for  INR of 8  IV fluids to maintain BP  DVT is likely chronic, would HIGHLY recommend discontinuing warfarin indefinitely at this point and placing an IVC filter.  Will check serial H&H q4 for response to blood  Consult to GI placed for today, Dr. RLaural Golden re: Remicaid  Given IV solumedrol for UC flare    Pain medications prn for rib pain and GI cramping  Monitor BMET, ?hyponatremia secondary to dehydration  Hyperglycemia and leukocytosis due to steroids   Code Status: Full Code Family Communication: Discussed plan with patient Disposition Plan: Home when medically stable. Anticipate >2 days inpatient  Time spent: 70 minutes  [facility] Pager [phone]  If 7PM-7AM, please contact night-coverage [email or web address] Password [email or web address]11/25/2013, 5:17 AM

## 2012-07-12 NOTE — ED Notes (Signed)
Patient states she was walking down the hallway and must have passed out.  States has been in the floor since 8pm.

## 2012-07-12 NOTE — ED Notes (Signed)
1 Unit FFP started.  Verified by Belva Crome, RN.

## 2012-07-13 ENCOUNTER — Ambulatory Visit (HOSPITAL_COMMUNITY): Payer: BC Managed Care – PPO

## 2012-07-13 ENCOUNTER — Encounter (HOSPITAL_COMMUNITY): Payer: Self-pay | Admitting: Radiology

## 2012-07-13 ENCOUNTER — Ambulatory Visit (HOSPITAL_COMMUNITY): Payer: Medicare Other

## 2012-07-13 DIAGNOSIS — K5120 Ulcerative (chronic) proctitis without complications: Secondary | ICD-10-CM

## 2012-07-13 DIAGNOSIS — D62 Acute posthemorrhagic anemia: Secondary | ICD-10-CM

## 2012-07-13 DIAGNOSIS — D689 Coagulation defect, unspecified: Secondary | ICD-10-CM

## 2012-07-13 LAB — CBC
HCT: 23.9 % — ABNORMAL LOW (ref 36.0–46.0)
HCT: 29.8 % — ABNORMAL LOW (ref 36.0–46.0)
HCT: 26.1 % — ABNORMAL LOW (ref 36.0–46.0)
Hemoglobin: 8.4 g/dL — ABNORMAL LOW (ref 12.0–15.0)
Hemoglobin: 10.2 g/dL — ABNORMAL LOW (ref 12.0–15.0)
Hemoglobin: 9.2 g/dL — ABNORMAL LOW (ref 12.0–15.0)
MCH: 31 pg (ref 26.0–34.0)
MCH: 30.9 pg (ref 26.0–34.0)
MCH: 31.4 pg (ref 26.0–34.0)
MCHC: 35.1 g/dL (ref 30.0–36.0)
MCHC: 34.2 g/dL (ref 30.0–36.0)
MCHC: 35.2 g/dL (ref 30.0–36.0)
MCV: 88.2 fL (ref 78.0–100.0)
MCV: 90.3 fL (ref 78.0–100.0)
MCV: 89.1 fL (ref 78.0–100.0)
Platelets: 350 10*3/uL (ref 150–400)
Platelets: 389 10*3/uL (ref 150–400)
Platelets: 323 10*3/uL (ref 150–400)
RBC: 2.71 MIL/uL — ABNORMAL LOW (ref 3.87–5.11)
RBC: 3.3 MIL/uL — ABNORMAL LOW (ref 3.87–5.11)
RBC: 2.93 MIL/uL — ABNORMAL LOW (ref 3.87–5.11)
RDW: 15.2 % (ref 11.5–15.5)
RDW: 15.6 % — ABNORMAL HIGH (ref 11.5–15.5)
RDW: 15.3 % (ref 11.5–15.5)
WBC: 4.5 10*3/uL (ref 4.0–10.5)
WBC: 5 10*3/uL (ref 4.0–10.5)
WBC: 7.8 10*3/uL (ref 4.0–10.5)

## 2012-07-13 LAB — COMPREHENSIVE METABOLIC PANEL
ALT: 15 U/L (ref 0–35)
AST: 11 U/L (ref 0–37)
Albumin: 2 g/dL — ABNORMAL LOW (ref 3.5–5.2)
Alkaline Phosphatase: 41 U/L (ref 39–117)
BUN: 12 mg/dL (ref 6–23)
CO2: 25 mEq/L (ref 19–32)
Calcium: 7.2 mg/dL — ABNORMAL LOW (ref 8.4–10.5)
Chloride: 102 mEq/L (ref 96–112)
Creatinine, Ser: 0.67 mg/dL (ref 0.50–1.10)
GFR calc Af Amer: >90 mL/min (ref >90)
GFR calc non Af Amer: 84 mL/min — ABNORMAL LOW (ref >90)
Glucose, Bld: 88 mg/dL (ref 70–99)
Potassium: 3.4 mEq/L — ABNORMAL LOW (ref 3.5–5.1)
Sodium: 132 mEq/L — ABNORMAL LOW (ref 135–145)
Total Bilirubin: 0.5 mg/dL (ref 0.3–1.2)
Total Protein: 4.7 g/dL — ABNORMAL LOW (ref 6.0–8.3)

## 2012-07-13 LAB — PREPARE FRESH FROZEN PLASMA
Unit division: 0
Unit division: 0

## 2012-07-13 LAB — PROTIME-INR
INR: 1.16 (ref 0.00–1.49)
Prothrombin Time: 14.6 seconds (ref 11.6–15.2)

## 2012-07-13 LAB — PREPARE RBC (CROSSMATCH)

## 2012-07-13 LAB — GLUCOSE, CAPILLARY: Glucose-Capillary: 85 mg/dL (ref 70–99)

## 2012-07-13 MED ORDER — FENTANYL CITRATE 0.05 MG/ML IJ SOLN
INTRAMUSCULAR | Status: AC | PRN
  Administered 2012-07-13: 25 ug via INTRAVENOUS

## 2012-07-13 MED ORDER — SODIUM CHLORIDE 0.9 % IJ SOLN: 10.0000 mL | INTRAMUSCULAR | Status: DC | PRN

## 2012-07-13 MED ORDER — POTASSIUM CHLORIDE IN NACL 20-0.9 MEQ/L-% IV SOLN
INTRAVENOUS | Status: DC
  Administered 2012-07-13 (×2): via INTRAVENOUS

## 2012-07-13 MED ORDER — IOHEXOL 300 MG/ML  SOLN
150.0000 mL | Freq: Once | INTRAMUSCULAR | Status: AC | PRN
  Administered 2012-07-13: 50 mL via INTRAVENOUS

## 2012-07-13 MED ORDER — MIDAZOLAM HCL 2 MG/2ML IJ SOLN
INTRAMUSCULAR | Status: AC | PRN
  Administered 2012-07-13: 1 mg via INTRAVENOUS

## 2012-07-13 NOTE — Progress Notes (Signed)
Reported to Sharyn Blitz, RN on Dept. 300, transferred to 321 in stable condition via w/c with staff.

## 2012-07-13 NOTE — Progress Notes (Signed)
UR Chart Review Completed  

## 2012-07-13 NOTE — Progress Notes (Signed)
INITIAL ADULT NUTRITION ASSESSMENT Date: 07/13/2012   Time: 1:43 PM Reason for Assessment: Malnutrition Screen  ASSESSMENT: Female 76 y.o.  Dx: Anemia due to blood loss, acute   Past Medical History  Diagnosis Date  . UC (ulcerative colitis)   . Lower abdominal pain   . Occult blood in stools   . Hemorrhoids, external   . HX: breast cancer   . DVT (deep venous thrombosis)   . Seizures   . Stroke    Related Meds:  Past Medical History  Diagnosis Date  . UC (ulcerative colitis)   . Lower abdominal pain   . Occult blood in stools   . Hemorrhoids, external   . HX: breast cancer   . DVT (deep venous thrombosis)   . Seizures   . Stroke     Ht: 5' 3"  (160 cm)  Wt: 154 lb 5.2 oz (70 kg)  Ideal Wt: 52.4 kg  % Ideal Wt: 134%  Usual Wt:  Wt Readings from Last 10 Encounters:  07/13/12 154 lb 5.2 oz (70 kg)  06/28/12 149 lb 14.4 oz (67.994 kg)  06/07/12 152 lb (68.947 kg)  11/05/11 164 lb 3.2 oz (74.481 kg)  05/06/11 165 lb (74.844 kg)     Body mass index is 27.34 kg/(m^2). Overweight  Food/Nutrition Related Hx: Pt assessed during hopitalization last month on 10/23. Wt currently within 2# of recent assessment. She has 10#,(4.5 kg), 6% wt loss since March this year which is not significant. Pt very familiar with diet guidelines given her 50 year hx with ulcerative colitis.She drinks Ensure at home to supplement nutrition intake. She does not meet criteria for malnutrition at this time.   CMP     Component Value Date/Time   NA 132* 07/13/2012 0152   K 3.4* 07/13/2012 0152   CL 102 07/13/2012 0152   CO2 25 07/13/2012 0152   GLUCOSE 88 07/13/2012 0152   BUN 12 07/13/2012 0152   CREATININE 0.67 07/13/2012 0152   CALCIUM 7.2* 07/13/2012 0152   PROT 4.7* 07/13/2012 0152   ALBUMIN 2.0* 07/13/2012 0152   AST 11 07/13/2012 0152   ALT 15 07/13/2012 0152   ALKPHOS 41 07/13/2012 0152   BILITOT 0.5 07/13/2012 0152   GFRNONAA 84* 07/13/2012 0152   GFRAA >90 07/13/2012  0152    Intake/Output Summary (Last 24 hours) at 07/13/12 1352 Last data filed at 07/13/12 0915  Gross per 24 hour  Intake 1972.92 ml  Output   2900 ml  Net -927.08 ml    Diet Order: NPO  Supplements/Tube Feeding:none at this time  IVF:    0.9 % NaCl with KCl 20 mEq / L Last Rate: 75 mL/hr at 07/13/12 0801  [DISCONTINUED] sodium chloride Last Rate: 100 mL/hr at 07/13/12 0600    Estimated Nutritional Needs:   Kcal:1540-1750 kcal Protein:70-80 gr Fluid:1 ml/kcal  NUTRITION DIAGNOSIS: -Inadequate oral intake (NI-2.1).  Status: Ongoing  RELATED TO: inability to eat  AS EVIDENCE BY: NPO status  MONITORING/EVALUATION(Goals): Monitor diet advancement, po meal intake and labs Goal: Pt to meet >/= 90% of their estimated nutrition needs; not met  EDUCATION NEEDS: -Education needs addressed  INTERVENTION: -When diet is resumed pt would like to receive Ensure Complete po BID, each supplement provides 350 kcal and 13 grams of protein.  Dietitian [phone]  DOCUMENTATION CODES Per approved criteria  -Not Applicable    Frederik Schmidt 07/13/2012, 1:43 PM

## 2012-07-13 NOTE — H&P (Signed)
Monique Day is an 76 y.o. female.   Chief Complaint: rectal bleeding Hx Ulcerative colitis Hx chronic DVT - on coumadin chronically (2006; 2009) Had supratherapeutic INR (8) Hx breast Ca Off coumadin now and no longer candidate for anticoagulation per MD Scheduled now for permanent Inferior Vena Cava filter placement HPI: UC; breast ca; chronic coumadin for dvt; CVA; Sz  Past Medical History  Diagnosis Date  . UC (ulcerative colitis)   . Lower abdominal pain   . Occult blood in stools   . Hemorrhoids, external   . HX: breast cancer   . DVT (deep venous thrombosis)   . Seizures   . Stroke     Past Surgical History  Procedure Date  . Breast lumpectomy   . Colonoscopy 02/21/2011  . Colonoscopy 10/06/2008  . Colonoscopy 12/27/01  . Colonoscopy 05/09/05  . Bone density 12/11/10    Family History  Problem Relation Age of Onset  . Prostate cancer Father    Social History:  reports that she quit smoking about 11 years ago. Her smoking use included Cigarettes. She has never used smokeless tobacco. She reports that she does not drink alcohol or use illicit drugs.  Allergies:  Allergies  Allergen Reactions  . Sulfa Antibiotics Other (See Comments)    Extreme Weakness    Medications Prior to Admission  Medication Sig Dispense Refill  . acetaminophen (TYLENOL) 500 MG tablet Take 500 mg by mouth every 6 (six) hours as needed. Pain.      . Calcium Carb-Cholecalciferol (CALCIUM 1000 + D PO) Take 3 tablets by mouth daily. Takes one tablet in the morning and two tablets in the evening.      . Cholecalciferol (VITAMIN D3) 2000 UNITS TABS Take 4,000 Units by mouth daily. Take 1 every morning and 1 at night       . denosumab (PROLIA) 60 MG/ML SOLN injection Inject 60 mg into the skin every 6 (six) months. Administer in upper arm, thigh, or abdomen      . mercaptopurine (PURINETHOL) 50 MG tablet Take 50 mg by mouth 2 (two) times daily. Give on an empty stomach 1 hour before or 2  hours after meals. Caution: Chemotherapy.      . Mesalamine (ASACOL HD) 800 MG TBEC Take 1,600 mg by mouth 2 (two) times daily.      Vladimir Faster Glycol-Propyl Glycol (SYSTANE OP) Apply 1 drop to eye at bedtime as needed. Dry eyes.      . predniSONE (DELTASONE) 20 MG tablet Take 30 mg by mouth daily.      . simethicone (MYLICON) 80 MG chewable tablet Chew 80 mg by mouth every 6 (six) hours as needed. Gas relief.      . warfarin (COUMADIN) 5 MG tablet Do not restart warfarin until 06/12/2012. When restarting it, take only 2.5 mg (or half a tablet) daily until you are reevaluated by your primary care physician. At that time, if there is no further rectal bleeding, you can resume the previous dosing under the guidance of Dr. Melina Copa.        Results for orders placed during the hospital encounter of 07/12/12 (from the past 48 hour(s))  CBC WITH DIFFERENTIAL     Status: Abnormal   Collection Time   07/12/12  3:37 AM      Component Value Range Comment   WBC 13.1 (*) 4.0 - 10.5 K/uL    RBC 2.75 (*) 3.87 - 5.11 MIL/uL    Hemoglobin 8.6 (*) 12.0 -  15.0 g/dL    HCT 24.7 (*) 36.0 - 46.0 %    MCV 89.8  78.0 - 100.0 fL    MCH 31.3  26.0 - 34.0 pg    MCHC 34.8  30.0 - 36.0 g/dL    RDW 15.8 (*) 11.5 - 15.5 %    Platelets 737 (*) 150 - 400 K/uL    Neutrophils Relative 81 (*) 43 - 77 %    Neutro Abs 10.6 (*) 1.7 - 7.7 K/uL    Lymphocytes Relative 10 (*) 12 - 46 %    Lymphs Abs 1.4  0.7 - 4.0 K/uL    Monocytes Relative 9  3 - 12 %    Monocytes Absolute 1.1 (*) 0.1 - 1.0 K/uL    Eosinophils Relative 0  0 - 5 %    Eosinophils Absolute 0.0  0.0 - 0.7 K/uL    Basophils Relative 0  0 - 1 %    Basophils Absolute 0.0  0.0 - 0.1 K/uL    WBC Morphology INCREASED BANDS (>20% BANDS)   VACUOLATED NEUTROPHILS   Smear Review PLATELETS APPEAR INCREASED     BASIC METABOLIC PANEL     Status: Abnormal   Collection Time   07/12/12  3:37 AM      Component Value Range Comment   Sodium 129 (*) 135 - 145 mEq/L     Potassium 3.7  3.5 - 5.1 mEq/L    Chloride 96  96 - 112 mEq/L    CO2 16 (*) 19 - 32 mEq/L    Glucose, Bld 218 (*) 70 - 99 mg/dL    BUN 34 (*) 6 - 23 mg/dL    Creatinine, Ser 1.10  0.50 - 1.10 mg/dL    Calcium 7.6 (*) 8.4 - 10.5 mg/dL    GFR calc non Af Amer 48 (*) >90 mL/min    GFR calc Af Amer 56 (*) >90 mL/min   APTT     Status: Abnormal   Collection Time   07/12/12  3:37 AM      Component Value Range Comment   aPTT 65 (*) 24 - 37 seconds   PROTIME-INR     Status: Abnormal   Collection Time   07/12/12  3:37 AM      Component Value Range Comment   Prothrombin Time 60.4 (*) 11.6 - 15.2 seconds RESULT REPEATED AND VERIFIED   INR 7.87 (*) 0.00 - 1.49   TYPE AND SCREEN     Status: Normal (Preliminary result)   Collection Time   07/12/12  3:38 AM      Component Value Range Comment   ABO/RH(D) A POS      Antibody Screen NEG      Sample Expiration 07/15/2012      Unit Number [id number]      Blood Component Type RED CELLS,LR      Unit division 00      Status of Unit ISSUED,FINAL      Transfusion Status OK TO TRANSFUSE      Crossmatch Result Compatible      Unit Number [id number]      Blood Component Type RED CELLS,LR      Unit division 00      Status of Unit ISSUED,FINAL      Transfusion Status OK TO TRANSFUSE      Crossmatch Result Compatible      Unit Number [id number]      Blood Component Type RED CELLS,LR      Unit division  00      Status of Unit ALLOCATED      Transfusion Status OK TO TRANSFUSE      Crossmatch Result Compatible      Unit Number [id number]      Blood Component Type RED CELLS,LR      Unit division 00      Status of Unit ALLOCATED      Transfusion Status OK TO TRANSFUSE      Crossmatch Result Compatible     PREPARE RBC (CROSSMATCH)     Status: Normal   Collection Time   07/12/12  3:38 AM      Component Value Range Comment   Order Confirmation ORDER PROCESSED BY BLOOD BANK     PREPARE RBC (CROSSMATCH)     Status: Normal   Collection Time    07/12/12  3:38 AM      Component Value Range Comment   Order Confirmation ORDER PROCESSED BY BLOOD BANK     PREPARE FRESH FROZEN PLASMA     Status: Normal   Collection Time   07/12/12  5:00 AM      Component Value Range Comment   Unit Number [id number]      Blood Component Type THAWED PLASMA      Unit division 00      Status of Unit ISSUED,FINAL      Transfusion Status OK TO TRANSFUSE      Unit Number [id number]      Blood Component Type THAWED PLASMA      Unit division 00      Status of Unit ISSUED,FINAL      Transfusion Status OK TO TRANSFUSE     CBC     Status: Abnormal   Collection Time   07/12/12  6:49 AM      Component Value Range Comment   WBC 7.6  4.0 - 10.5 K/uL    RBC 2.23 (*) 3.87 - 5.11 MIL/uL    Hemoglobin 6.8 (*) 12.0 - 15.0 g/dL    HCT 20.3 (*) 36.0 - 46.0 %    MCV 91.0  78.0 - 100.0 fL    MCH 30.5  26.0 - 34.0 pg    MCHC 33.5  30.0 - 36.0 g/dL    RDW 16.0 (*) 11.5 - 15.5 %    Platelets 500 (*) 150 - 400 K/uL DELTA CHECK NOTED  MRSA PCR SCREENING     Status: Normal   Collection Time   07/12/12  7:13 AM      Component Value Range Comment   MRSA by PCR NEGATIVE  NEGATIVE   GLUCOSE, CAPILLARY     Status: Abnormal   Collection Time   07/12/12  8:13 AM      Component Value Range Comment   Glucose-Capillary 101 (*) 70 - 99 mg/dL    Comment 1 Documented in Chart      Comment 2 Notify RN     CBC     Status: Abnormal   Collection Time   07/12/12  7:48 PM      Component Value Range Comment   WBC 5.3  4.0 - 10.5 K/uL SMEAR STAINED AND AVAILABLE FOR REVIEW   RBC 2.75 (*) 3.87 - 5.11 MIL/uL    Hemoglobin 8.5 (*) 12.0 - 15.0 g/dL    HCT 24.4 (*) 36.0 - 46.0 %    MCV 88.7  78.0 - 100.0 fL    MCH 30.9  26.0 - 34.0 pg    MCHC 34.8  30.0 -  36.0 g/dL    RDW 15.3  11.5 - 15.5 %    Platelets 218  150 - 400 K/uL DELTA CHECK NOTED  CBC     Status: Abnormal   Collection Time   07/13/12  1:43 AM      Component Value Range Comment   WBC 4.5  4.0 - 10.5 K/uL      RBC 2.71 (*) 3.87 - 5.11 MIL/uL    Hemoglobin 8.4 (*) 12.0 - 15.0 g/dL    HCT 23.9 (*) 36.0 - 46.0 %    MCV 88.2  78.0 - 100.0 fL    MCH 31.0  26.0 - 34.0 pg    MCHC 35.1  30.0 - 36.0 g/dL    RDW 15.2  11.5 - 15.5 %    Platelets 350  150 - 400 K/uL DELTA CHECK NOTED  PROTIME-INR     Status: Normal   Collection Time   07/13/12  1:52 AM      Component Value Range Comment   Prothrombin Time 14.6  11.6 - 15.2 seconds    INR 1.16  0.00 - 1.49   COMPREHENSIVE METABOLIC PANEL     Status: Abnormal   Collection Time   07/13/12  1:52 AM      Component Value Range Comment   Sodium 132 (*) 135 - 145 mEq/L    Potassium 3.4 (*) 3.5 - 5.1 mEq/L    Chloride 102  96 - 112 mEq/L    CO2 25  19 - 32 mEq/L    Glucose, Bld 88  70 - 99 mg/dL    BUN 12  6 - 23 mg/dL DELTA CHECK NOTED   Creatinine, Ser 0.67  0.50 - 1.10 mg/dL DELTA CHECK NOTED   Calcium 7.2 (*) 8.4 - 10.5 mg/dL    Total Protein 4.7 (*) 6.0 - 8.3 g/dL    Albumin 2.0 (*) 3.5 - 5.2 g/dL    AST 11  0 - 37 U/L    ALT 15  0 - 35 U/L    Alkaline Phosphatase 41  39 - 117 U/L    Total Bilirubin 0.5  0.3 - 1.2 mg/dL    GFR calc non Af Amer 84 (*) >90 mL/min    GFR calc Af Amer >90  >90 mL/min   CBC     Status: Abnormal   Collection Time   07/13/12  6:45 AM      Component Value Range Comment   WBC 5.0  4.0 - 10.5 K/uL    RBC 3.30 (*) 3.87 - 5.11 MIL/uL    Hemoglobin 10.2 (*) 12.0 - 15.0 g/dL DELTA CHECK NOTED   HCT 29.8 (*) 36.0 - 46.0 %    MCV 90.3  78.0 - 100.0 fL    MCH 30.9  26.0 - 34.0 pg    MCHC 34.2  30.0 - 36.0 g/dL    RDW 15.6 (*) 11.5 - 15.5 %    Platelets 389  150 - 400 K/uL   GLUCOSE, CAPILLARY     Status: Normal   Collection Time   07/13/12  7:31 AM      Component Value Range Comment   Glucose-Capillary 85  70 - 99 mg/dL    Dg Chest Port 1 View  07/12/2012  *RADIOLOGY REPORT*  Clinical Data: Left lateral rib pain, status post fall.  PORTABLE CHEST - 1 VIEW  Comparison: None.  Findings: The lungs are well-aerated  and clear.  There is no evidence of focal opacification, pleural  effusion or pneumothorax.  The heart is normal in size; the mediastinal contour is within normal limits.  No acute osseous abnormalities are seen.  Scattered clips are noted at the axilla bilaterally.  IMPRESSION: No acute cardiopulmonary process seen; no displaced rib fractures identified.   Original Report Authenticated By: Santa Lighter, M.D.     Review of Systems  Constitutional: Negative for fever.  Respiratory: Negative for shortness of breath.   Gastrointestinal: Positive for blood in stool. Negative for nausea, vomiting and abdominal pain.  Neurological: Positive for weakness.    Blood pressure 112/57, pulse 87, temperature 98.4 F (36.9 C), temperature source Oral, resp. rate 12, height 5' 3"  (1.6 m), weight 154 lb 5.2 oz (70 kg), SpO2 94.00%. Physical Exam  Constitutional: She is oriented to person, place, and time. She appears well-developed.  Cardiovascular: Normal rate, regular rhythm and normal heart sounds.   No murmur heard. Respiratory: Effort normal and breath sounds normal. She has no wheezes.  GI: Soft. Bowel sounds are normal. There is no tenderness.  Musculoskeletal: Normal range of motion.  Neurological: She is alert and oriented to person, place, and time.  Skin: Skin is warm.  Psychiatric: She has a normal mood and affect. Her behavior is normal. Judgment and thought content normal.     Assessment/Plan Rectal bleeding On coumadin for chronic dvt Hx breast ca; Ulc colitis No longer anticoag candidate Scheduled for IVC filter placement Pt aware of procedure benefits and risks and agreeable to proceed Consent signed and in chart  Jerrod Damiano A 07/13/2012, 12:12 PM

## 2012-07-13 NOTE — Progress Notes (Signed)
Patient ID: Monique Day, female   DOB: 18-Mar-1936, 76 y.o.   MRN: [id number]   Pt with rectal bleeding Previous chronic DVT - on coumadin supratherapeutic INR (8)  Off anticoagulation now INR 1.16  Request for IVC filter Discussed with Dr Barbie Banner  Pt to come to [facility] Rad today via ambulance at 1100 am IVC filter placement Back to [facility] after procedure

## 2012-07-13 NOTE — Progress Notes (Signed)
Attempted to arrange Remicade infusion for Nov 27. No spaces available. [facility] also states that as pt would be considered an inpatient, reimbursement would be an issue (would not get paid). They are able to arrange this outpatient next week. Will discuss with Dr. Gala Romney, who is covering for Dr. Laural Golden while he is away.

## 2012-07-13 NOTE — Procedures (Signed)
Placement of IVC filter without complication.  See Radiology report.

## 2012-07-13 NOTE — Progress Notes (Signed)
     Subjective: This lady had just a small amount of rectal bleeding yesterday but nothing compared to when she first presented. There is no hematemesis or abdominal pain. She has had 2 units of blood transfusion.           Physical Exam: Blood pressure 92/54, pulse 97, temperature 99 F (37.2 C), temperature source Oral, resp. rate 13, height 5' 3"  (1.6 m), weight 70 kg (154 lb 5.2 oz), SpO2 98.00%. She looks systemically well and is not clinically shock despite the slightly soft blood pressure. Heart sounds are present and normal. Lung fields are clear. She is alert and oriented.   Investigations:  Recent Results (from the past 240 hour(s))  MRSA PCR SCREENING     Status: Normal   Collection Time   07/12/12  7:13 AM      Component Value Range Status Comment   MRSA by PCR NEGATIVE  NEGATIVE Final      Basic Metabolic Panel:  Basename 07/13/12 0152 07/12/12 0337  NA 132* 129*  K 3.4* 3.7  CL 102 96  CO2 25 16*  GLUCOSE 88 218*  BUN 12 34*  CREATININE 0.67 1.10  CALCIUM 7.2* 7.6*  MG -- --  PHOS -- --   Liver Function Tests:  [facility] 07/13/12 0152  AST 11  ALT 15  ALKPHOS 41  BILITOT 0.5  PROT 4.7*  ALBUMIN 2.0*     CBC:  Basename 07/13/12 0143 07/12/12 1948 07/12/12 0337  WBC 4.5 5.3 --  NEUTROABS -- -- 10.6*  HGB 8.4* 8.5* --  HCT 23.9* 24.4* --  MCV 88.2 88.7 --  PLT 350 218 --    Dg Chest Port 1 View  07/12/2012  *RADIOLOGY REPORT*  Clinical Data: Left lateral rib pain, status post fall.  PORTABLE CHEST - 1 VIEW  Comparison: None.  Findings: The lungs are well-aerated and clear.  There is no evidence of focal opacification, pleural effusion or pneumothorax.  The heart is normal in size; the mediastinal contour is within normal limits.  No acute osseous abnormalities are seen.  Scattered clips are noted at the axilla bilaterally.  IMPRESSION: No acute cardiopulmonary process seen; no displaced rib fractures identified.   Original Report  Authenticated By: Santa Lighter, M.D.       Medications: I have reviewed the patient's current medications.  Impression: 1. Acute blood loss anemia secondary to GI bleed from ulcerative colitis. 2. Ulcerative colitis, refractory to treatment. 3. Coagulopathy with INR greater than 8 on presentation. INR now 1.16. 4. History of 2 episodes of DVT in the past. 5. Hypokalemia.     Plan: 1. Replete potassium. 2. Further blood transfusion with 2 units. 3. IVC filter placement.     LOS: 1 day   Doree Albee Pager [phone]  07/13/2012, 7:49 AM

## 2012-07-14 DIAGNOSIS — K5190 Ulcerative colitis, unspecified, without complications: Secondary | ICD-10-CM

## 2012-07-14 LAB — CBC
HCT: 26 % — ABNORMAL LOW (ref 36.0–46.0)
HCT: 29.8 % — ABNORMAL LOW (ref 36.0–46.0)
HCT: 37.4 % (ref 36.0–46.0)
Hemoglobin: 8.9 g/dL — ABNORMAL LOW (ref 12.0–15.0)
Hemoglobin: 10.2 g/dL — ABNORMAL LOW (ref 12.0–15.0)
Hemoglobin: 13.3 g/dL (ref 12.0–15.0)
MCH: 30.6 pg (ref 26.0–34.0)
MCH: 30.8 pg (ref 26.0–34.0)
MCH: 31.7 pg (ref 26.0–34.0)
MCHC: 34.2 g/dL (ref 30.0–36.0)
MCHC: 34.2 g/dL (ref 30.0–36.0)
MCHC: 35.6 g/dL (ref 30.0–36.0)
MCV: 89.3 fL (ref 78.0–100.0)
MCV: 90 fL (ref 78.0–100.0)
MCV: 89 fL (ref 78.0–100.0)
Platelets: 325 10*3/uL (ref 150–400)
Platelets: 358 10*3/uL (ref 150–400)
Platelets: 226 10*3/uL (ref 150–400)
RBC: 2.91 MIL/uL — ABNORMAL LOW (ref 3.87–5.11)
RBC: 3.31 MIL/uL — ABNORMAL LOW (ref 3.87–5.11)
RBC: 4.2 MIL/uL (ref 3.87–5.11)
RDW: 15.5 % (ref 11.5–15.5)
RDW: 15.5 % (ref 11.5–15.5)
RDW: 12.5 % (ref 11.5–15.5)
WBC: 5.8 10*3/uL (ref 4.0–10.5)
WBC: 7.8 10*3/uL (ref 4.0–10.5)
WBC: 5.2 10*3/uL (ref 4.0–10.5)

## 2012-07-14 LAB — COMPREHENSIVE METABOLIC PANEL
ALT: 15 U/L (ref 0–35)
AST: 10 U/L (ref 0–37)
Albumin: 2.1 g/dL — ABNORMAL LOW (ref 3.5–5.2)
Alkaline Phosphatase: 50 U/L (ref 39–117)
BUN: 11 mg/dL (ref 6–23)
CO2: 23 mEq/L (ref 19–32)
Calcium: 7.6 mg/dL — ABNORMAL LOW (ref 8.4–10.5)
Chloride: 102 mEq/L (ref 96–112)
Creatinine, Ser: 0.68 mg/dL (ref 0.50–1.10)
GFR calc Af Amer: >90 mL/min (ref >90)
GFR calc non Af Amer: 84 mL/min — ABNORMAL LOW (ref >90)
Glucose, Bld: 120 mg/dL — ABNORMAL HIGH (ref 70–99)
Potassium: 3.7 mEq/L (ref 3.5–5.1)
Sodium: 133 mEq/L — ABNORMAL LOW (ref 135–145)
Total Bilirubin: 0.2 mg/dL — ABNORMAL LOW (ref 0.3–1.2)
Total Protein: 5.1 g/dL — ABNORMAL LOW (ref 6.0–8.3)

## 2012-07-14 LAB — GLUCOSE, CAPILLARY: Glucose-Capillary: 103 mg/dL — ABNORMAL HIGH (ref 70–99)

## 2012-07-14 MED ORDER — PREDNISONE 10 MG PO TABS: 30.0000 mg | ORAL_TABLET | Freq: Every day | ORAL | Status: DC

## 2012-07-14 MED ORDER — ENSURE COMPLETE PO LIQD: 237.0000 mL | Freq: Two times a day (BID) | ORAL | Status: DC

## 2012-07-14 NOTE — Progress Notes (Addendum)
Subjective: Saw small amount of mucus this morning in stool, NO BLOOD. No abdominal pain, occasional gas. No N/V. Tolerating diet. Has never had Remicade before. Had been scheduled for 11/26 but had IVC filter placed yesterday. Was on Prednisone 30 mg at home.   Objective: Vital signs in last 24 hours: Temp:  [97.9 F (36.6 C)-98.7 F (37.1 C)] 98.7 F (37.1 C) (11/27 0454) Pulse Rate:  [78-97] 84  (11/27 0454) Resp:  [11-20] 18  (11/27 0454) BP: (93-113)/(48-66) 110/59 mmHg (11/27 0454) SpO2:  [94 %-100 %] 96 % (11/27 0454) Last BM Date: 07/12/12 General:   Alert and oriented, pleasant Head:  Normocephalic and atraumatic. Eyes:  No icterus, sclera clear. Conjuctiva pink.  Heart:  S1, S2 present, no murmurs noted.  Lungs: Clear to auscultation bilaterally, without wheezing, rales, or rhonchi.  Abdomen:  Bowel sounds present, soft, non-tender, non-distended. No HSM or hernias noted. No rebound or guarding. No masses appreciated  Msk:  Symmetrical without gross deformities. Normal posture. Neurologic:  Alert and  oriented x4;  grossly normal neurologically. Skin:  Warm and dry, intact without significant lesions.  Psych:  Alert and cooperative. Normal mood and affect.  Intake/Output from previous day: 11/26 0701 - 11/27 0700 In: 1760.4 [I.V.:1760.4] Out: 400 [Urine:400] Intake/Output this shift:    Lab Results:  Basename 07/14/12 0137 07/13/12 2012 07/13/12 0645  WBC 5.8 7.8 5.0  HGB 8.9* 9.2* 10.2*  HCT 26.0* 26.1* 29.8*  PLT 325 323 389   BMET  Basename 07/14/12 0154 07/13/12 0152 07/12/12 0337  NA 133* 132* 129*  K 3.7 3.4* 3.7  CL 102 102 96  CO2 23 25 16*  GLUCOSE 120* 88 218*  BUN 11 12 34*  CREATININE 0.68 0.67 1.10  CALCIUM 7.6* 7.2* 7.6*   LFT  Basename 07/14/12 0154 07/13/12 0152  PROT 5.1* 4.7*  ALBUMIN 2.1* 2.0*  AST 10 11  ALT 15 15  ALKPHOS 50 41  BILITOT 0.2* 0.5  BILIDIR -- --  IBILI -- --   PT/INR  Basename 07/13/12 0152 07/12/12 0337    LABPROT 14.6 60.4*  INR 1.16 7.87*     Assessment: 76 year old pleasant female with hx of chronic ulcerative colitis, refractory to treatment. Hx complicated by anti-coagulation secondary to DVT; Coumadin has been held and IVC filter placed. No further rectal bleeding, pt clinically improving and appropriate for discharge home today. Prior therapy for UC as outlined in Dr. Olevia Perches note; she will need to be discharged on Prednisone 30 mg daily. Remicade will be scheduled for early next week.  Plan: Appropriate for d/c with Prednisone maintained at 30 mg Will arrange for initial Remicade infusion early next week, with close follow-up with Dr. Laural Golden.   LOS: 2 days   Laban Emperor  07/14/2012, 8:16 AM  Addendum: Spoke with [facility]. They still have orders for Remicade on file, so I did not fax additional orders. Pt is scheduled for December 6th at 10:45. I will send Dr. Laural Golden and Karna Christmas a note as an FYI. Also, no premedication was noted on the original orders for Remicade; I will defer this to Dr. Laural Golden and make sure this is addressed. Contacted Abby, primary RN for patient today. She will let pt know.

## 2012-07-14 NOTE — Discharge Summary (Signed)
Physician Discharge Summary  Monique Day ZMO:[id number] DOB: 05/07/1936 DOA: 07/12/2012  PCP: Octavio Graves, DO  Admit date: 07/12/2012 Discharge date: 07/14/2012  Time spent: Greater than 30 minutes  Recommendations for Outpatient Follow-up:  1. Followup with gastroenterology, Dr. Laural Golden for Remicade.   Discharge Diagnoses:  1. Rectal bleeding secondary to ulcerative colitis flare in the setting of coagulopathy secondary to warfarin. 2. Acute blood loss anemia  secondary to #1, requiring 2 units blood transfusion. 3. Status post IVC filter placement.   Discharge Condition: Stable and improved.  Diet recommendation: Regular.  Filed Weights   07/12/12 0357 07/13/12 0500  Weight: 68.04 kg (150 lb) 70 kg (154 lb 5.2 oz)    History of present illness:  This very pleasant 76 year old lady presents to the hospital with symptoms of rectal bleeding. Please see initial history is outlined below: HPI: Monique Day is a 76 y.o. female with longstanding ulcerative colitis who for the past few months has had recurrent disease flares when weaning off steroids. 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 She required a blood transfusion 3 days ago for low Hb from lower GI blood loss. She was scheduled for Remicaid treatment today with Dr. Laural Golden but last evening had 2 large, loose bloody stools and became very weak and dizzy following those bowel movements. She actually had syncope and collapsed at home and it took her 4 hours to get to the phone to call 911 for help. When EMS arrived she was on floor hypotensive and tachycardic.She fell on her left side and is having rib pain.  In ED she was found to have a supratherapeutic INR >8, Hb of 8.6 down from 11.6, recurrent bloody stools and hypotensive. She was aggressively hydrated and 2 units of PRBC and FFP have been ordered. [facility] called for admission.  Hospital  Course:  The patient was admitted and in view of the INR greater than 8, she was given fresh frozen plasma and vitamin K. She also required 2 units blood transfusion. Her hemoglobin stabilized after this and her INR returned back to normal. After further discussion with the patient and gastroenterology, Dr. Laural Golden it was felt appropriate that we now discontinue warfarin indefinitely. Therefore she had IVC filter placed on 07/13/2012. It was felt that in view of her intractable ulcerative colitis, that she would be a good candidate for Remicade. This will be initiated as an outpatient. In the meantime, she is being sent home with prednisone 30 mg daily.  Procedures:  IVC filter placement by interventional radiology on July 13 2012.   Consultations:  Gastroenterology, Dr. Laural Golden.  Discharge Exam: Filed Vitals:   07/13/12 1648 07/13/12 2055 07/14/12 0227 07/14/12 0454  BP: 110/66 107/54 105/63 110/59  Pulse: 83 92 78 84  Temp: 98.2 F (36.8 C) 97.9 F (36.6 C) 98.1 F (36.7 C) 98.7 F (37.1 C)  TempSrc: Oral Oral Oral Oral  Resp: 14 16 17 18   Height:      Weight:      SpO2: 96% 96% 96% 96%    General: She looks systemically well. Cardiovascular: Heart sounds are present and normal without gallop rhythm. Respiratory: Lung fields are clear. Abdomen is soft and nontender.  Discharge Instructions  Discharge Orders    Future Appointments: Provider: Department: Dept Phone: Center:   07/26/2012 2:15 PM [name], MD [place] [phone] None   07/27/2012 10:45  AM [facility] Team B [facility] [phone] None   09/07/2012 10:45 AM [facility] Team B [facility] [phone] None   11/02/2012 10:45 AM [facility] Team B [facility] [phone] None     Future Orders Please Complete By Expires   Diet - low sodium heart healthy      Increase activity slowly          Medication List     As of 07/14/2012  10:39 AM    STOP taking these medications         mercaptopurine 50 MG tablet   Commonly known as: PURINETHOL      warfarin 5 MG tablet   Commonly known as: COUMADIN      TAKE these medications         acetaminophen 500 MG tablet   Commonly known as: TYLENOL   Take 500 mg by mouth every 6 (six) hours as needed. Pain.      ASACOL HD 800 MG Tbec   Generic drug: Mesalamine   Take 1,600 mg by mouth 2 (two) times daily.      CALCIUM 1000 + D PO   Take 3 tablets by mouth daily. Takes one tablet in the morning and two tablets in the evening.      denosumab 60 MG/ML Soln injection   Commonly known as: PROLIA   Inject 60 mg into the skin every 6 (six) months. Administer in upper arm, thigh, or abdomen      predniSONE 10 MG tablet   Commonly known as: DELTASONE   Take 3 tablets (30 mg total) by mouth daily.      simethicone 80 MG chewable tablet   Commonly known as: MYLICON   Chew 80 mg by mouth every 6 (six) hours as needed. Gas relief.      SYSTANE OP   Apply 1 drop to eye at bedtime as needed. Dry eyes.      Vitamin D3 2000 UNITS Tabs   Take 4,000 Units by mouth daily. Take 1 every morning and 1 at night            The results of significant diagnostics from this hospitalization (including imaging, microbiology, ancillary and laboratory) are listed below for reference.    Significant Diagnostic Studies: Ir Ivc Filter Plmt / S&i /img Guid/mod Sed  07/13/2012  *RADIOLOGY REPORT*  Indication: 76 year old with history of deep vein thrombosis and ulcerative colitis.  The patient is no longer a candidate for anticoagulation due to GI bleeding.  PROCEDURE(S): IVC FILTER PLACEMENT; IVC VENOGRAM; ULTRASOUND FOR VASCULAR ACCESS  Physician:  Stephan Minister. Henn, MD  Medications: Versed 1 mg, Fentanyl 25 mcg. A radiology nurse monitored the patient for moderate sedation.  Moderate sedation time: 23 minutes  Fluoroscopy time: 2.5 minutes  Contrast:50 ml Omnipaque-300  Procedure:Informed  consent was obtained for an IVC venogram and filter placement.  Ultrasound demonstrated a patent right internal jugular vein.  Ultrasound images were obtained for documentation. The right side of the neck was prepped and draped in a sterile fashion.  Maximal barrier sterile technique was utilized including caps, mask, sterile gowns, sterile gloves, sterile drape, hand hygiene and skin antiseptic.  The skin was anesthetized with 1% lidocaine.  A 21 gauge needle was directed into the vein with ultrasound guidance and a micropuncture dilator set was placed.  A wire was advanced into the IVC.  The filter sheath was advanced over the wire into the IVC.  An IVC  venogram was performed. Fluoroscopic images were obtained for documentation.  A Bard Denali filter was deployed below the lowest renal vein.  A follow-up venogram was performed and the vascular sheath was removed with manual compression.  Findings:IVC was patent.  Bilateral renal veins were identified. The filter was deployed below the lowest renal vein.  Follow-up venogram confirmed placement within the IVC and below the renal veins.  Impression:Successful placement of a retrievable IVC filter.   Original Report Authenticated By: Markus Daft, M.D.    Dg Chest Port 1 View  07/12/2012  *RADIOLOGY REPORT*  Clinical Data: Left lateral rib pain, status post fall.  PORTABLE CHEST - 1 VIEW  Comparison: None.  Findings: The lungs are well-aerated and clear.  There is no evidence of focal opacification, pleural effusion or pneumothorax.  The heart is normal in size; the mediastinal contour is within normal limits.  No acute osseous abnormalities are seen.  Scattered clips are noted at the axilla bilaterally.  IMPRESSION: No acute cardiopulmonary process seen; no displaced rib fractures identified.   Original Report Authenticated By: Santa Lighter, M.D.     Microbiology: Recent Results (from the past 240 hour(s))  MRSA PCR SCREENING     Status: Normal   Collection  Time   07/12/12  7:13 AM      Component Value Range Status Comment   MRSA by PCR NEGATIVE  NEGATIVE Final      Labs: Basic Metabolic Panel:  Lab 38/18/29 0154 07/13/12 0152 07/12/12 0337  NA 133* 132* 129*  K 3.7 3.4* 3.7  CL 102 102 96  CO2 23 25 16*  GLUCOSE 120* 88 218*  BUN 11 12 34*  CREATININE 0.68 0.67 1.10  CALCIUM 7.6* 7.2* 7.6*  MG -- -- --  PHOS -- -- --   Liver Function Tests:  Lab 07/14/12 0154 07/13/12 0152  AST 10 11  ALT 15 15  ALKPHOS 50 41  BILITOT 0.2* 0.5  PROT 5.1* 4.7*  ALBUMIN 2.1* 2.0*     CBC:  Lab 07/14/12 0641 07/14/12 0137 07/13/12 2012 07/13/12 0645 07/13/12 0143 07/12/12 0337  WBC 7.8 5.8 7.8 5.0 4.5 --  NEUTROABS -- -- -- -- -- 10.6*  HGB 10.2* 8.9* 9.2* 10.2* 8.4* --  HCT 29.8* 26.0* 26.1* 29.8* 23.9* --  MCV 90.0 89.3 89.1 90.3 88.2 --  PLT 358 325 323 389 350 --   INR 1.16.   CBG:  Lab 07/14/12 0729 07/13/12 0731 07/12/12 0813  GLUCAP 103* 85 101*       Signed:  Ashley  [facility] 07/14/2012, 10:39 AM

## 2012-07-14 NOTE — Progress Notes (Signed)
Pt verbalizes understanding of medications, d/c instructions and follow up information. No questions at this time. I changed vaseline dressing on pts neck. Pt verbalizes understanding of wound care. Pt d/c via wheelchair by NT  Araceli Bouche, Glynis Smiles

## 2012-07-16 ENCOUNTER — Telehealth (HOSPITAL_COMMUNITY): Payer: Self-pay

## 2012-07-16 LAB — TYPE AND SCREEN
ABO/RH(D): A POS
Antibody Screen: NEGATIVE
Unit division: 0
Unit division: 0
Unit division: 0
Unit division: 0

## 2012-07-23 ENCOUNTER — Encounter (HOSPITAL_COMMUNITY): Payer: Medicare Other | Attending: Internal Medicine

## 2012-07-23 VITALS — BP 109/72 | HR 103 | Temp 98.1°F | Resp 18 | Wt 149.2 lb

## 2012-07-23 DIAGNOSIS — D649 Anemia, unspecified: Secondary | ICD-10-CM | POA: Insufficient documentation

## 2012-07-23 DIAGNOSIS — K5190 Ulcerative colitis, unspecified, without complications: Secondary | ICD-10-CM | POA: Insufficient documentation

## 2012-07-23 DIAGNOSIS — K5120 Ulcerative (chronic) proctitis without complications: Secondary | ICD-10-CM

## 2012-07-23 MED ORDER — SODIUM CHLORIDE 0.9 % IV SOLN
Freq: Once | INTRAVENOUS | Status: AC
  Administered 2012-07-23: 11:00:00 via INTRAVENOUS

## 2012-07-23 MED ORDER — SODIUM CHLORIDE 0.9 % IV SOLN
5.0000 mg/kg | Freq: Once | INTRAVENOUS | Status: AC
  Administered 2012-07-23: 300 mg via INTRAVENOUS
  Filled 2012-07-23: qty 30

## 2012-07-23 MED ORDER — SODIUM CHLORIDE 0.9 % IJ SOLN: 10.0000 mL | Freq: Once | INTRAMUSCULAR | Status: DC

## 2012-07-23 NOTE — Progress Notes (Signed)
Tolerated well

## 2012-07-26 ENCOUNTER — Encounter (INDEPENDENT_AMBULATORY_CARE_PROVIDER_SITE_OTHER): Payer: Self-pay | Admitting: Internal Medicine

## 2012-07-26 ENCOUNTER — Ambulatory Visit (INDEPENDENT_AMBULATORY_CARE_PROVIDER_SITE_OTHER): Payer: Medicare Other | Admitting: Internal Medicine

## 2012-07-26 VITALS — BP 100/70 | HR 78 | Temp 97.8°F | Resp 18 | Ht 63.0 in | Wt 150.1 lb

## 2012-07-26 DIAGNOSIS — K5190 Ulcerative colitis, unspecified, without complications: Secondary | ICD-10-CM

## 2012-07-26 DIAGNOSIS — D649 Anemia, unspecified: Secondary | ICD-10-CM

## 2012-07-26 MED ORDER — ASPIRIN 81 MG PO TABS: 81.0000 mg | ORAL_TABLET | Freq: Every day | ORAL | Status: DC

## 2012-07-26 NOTE — Patient Instructions (Signed)
Physician will contact you with results of CBC when completed. Remember to take Zyrtec 10 mg and Tylenol 650 mg by mouth one hour before your next Remicade on infliximab infusion

## 2012-07-26 NOTE — Progress Notes (Signed)
Presenting complaint;  Followup for ulcerative colitis and anemia.  Subjective:  Patient is 76 year old Caucasian female who is here for scheduled visit. Last visit was 4 weeks ago because of leukopenia secondary to 6 MP. She only took 15 doses. 2 weeks ago she was admitted to [facility] because of significant bleed in the setting of supratherapeutic INR. She received 2 units of PRBCs. Coagulopathy was corrected with vitamin K. Dr. Anastasio Champion recommended placement of IV filter and discontinuing warfarin. She had filter placed without any difficulty. While in the hospital her WBC was normal with resolution of leukopenia. She received first infusion of Remicade on 07/23/2012 and experience no side effects. She feels much better. Over the weekend she went grocery shopping after one months. She stopped prednisone 4 days ago and had no side effects(she forgot to taper). She is not passing blood per rectum anymore. She remains with diarrhea it usually between 2 AM and 6 AM but frequency has decreased. All she passes gas mucus and bits and pieces of stool. She denies abdominal pain and her appetite is back to normal. She has noted edema around her ankles.  Current Medications: Current Outpatient Prescriptions  Medication Sig Dispense Refill  . acetaminophen (TYLENOL) 325 MG tablet Take 650 mg by mouth as needed. Patient takes prior to Remicade Infusion      . inFLIXimab (REMICADE) 100 MG injection Inject into the vein. Patient was infused for the first time on 07/23/12. She is to have another infusion on the 07/27/12 and them the September 07 2012 then every 8 weeks      . Polyethyl Glycol-Propyl Glycol (SYSTANE OP) Apply 1 drop to eye at bedtime as needed. Dry eyes.      . Calcium Carb-Cholecalciferol (CALCIUM 1000 + D PO) Take 3 tablets by mouth daily. Takes one tablet in the morning and two tablets in the evening.      . Cholecalciferol (VITAMIN D3) 2000 UNITS TABS Take 4,000 Units by mouth daily.  Take 1 every morning and 1 at night       . denosumab (PROLIA) 60 MG/ML SOLN injection Inject 60 mg into the skin every 6 (six) months. Administer in upper arm, thigh, or abdomen      . Mesalamine (ASACOL HD) 800 MG TBEC Take 1,600 mg by mouth 2 (two) times daily.         Objective: Blood pressure 100/70, pulse 78, temperature 97.8 F (36.6 C), temperature source Oral, resp. rate 18, height 5' 3"  (1.6 m), weight 150 lb 1.6 oz (68.085 kg). Patient is alert and in no acute distress. Conjunctiva is pink. Sclera is nonicteric Oropharyngeal mucosa is normal. No neck masses or thyromegaly noted. Cardiac exam with regular rhythm normal S1 and S2. No murmur or gallop noted. Lungs are clear to auscultation. Abdomen is soft with mild LLQ tenderness. No organomegaly or masses. She has 1+ pitting edema around her ankles.  Labs/studies;  CBC from 07/14/2012 WBC 5.2, H&H 13.3 and 37.4 and platelet count 226K.   Assessment:  #1. Chronic UC with recent relapse unresponsive to systemic steroid therapy. She developed profound neutropenia at 15 doses of 6-MP. She also had significant bleed with supratherapeutic INR requiring 2 units of PRBCs. She has experienced no side effects at first infusion of Remicade with significant symptomatic improvement. She stopped prednisone without taper but luckily having no symptoms.  #2. Anemia secondary to LGI bleed. H&H posttransfusion was normal. Now that she's off anticoagulant she is not passing blood per  rectum anymore.    Plan:  CBC with differential on 08/06/2012 prior to second dose of Remicade. Continue Asacol HD at 1.6 g by mouth twice a day. Office visit in 10 weeks.

## 2012-07-27 ENCOUNTER — Ambulatory Visit (HOSPITAL_COMMUNITY): Payer: BC Managed Care – PPO

## 2012-08-03 ENCOUNTER — Encounter (INDEPENDENT_AMBULATORY_CARE_PROVIDER_SITE_OTHER): Payer: Self-pay

## 2012-08-06 ENCOUNTER — Encounter (HOSPITAL_COMMUNITY): Payer: Medicare Other

## 2012-08-06 VITALS — BP 122/83 | HR 92 | Temp 98.0°F | Resp 18 | Wt 154.0 lb

## 2012-08-06 DIAGNOSIS — D649 Anemia, unspecified: Secondary | ICD-10-CM

## 2012-08-06 DIAGNOSIS — K5190 Ulcerative colitis, unspecified, without complications: Secondary | ICD-10-CM

## 2012-08-06 LAB — CBC WITH DIFFERENTIAL/PLATELET
Basophils Absolute: 0 10*3/uL (ref 0.0–0.1)
Basophils Relative: 0 % (ref 0–1)
Eosinophils Absolute: 0.1 10*3/uL (ref 0.0–0.7)
Eosinophils Relative: 2 % (ref 0–5)
HCT: 33.6 % — ABNORMAL LOW (ref 36.0–46.0)
Hemoglobin: 10.7 g/dL — ABNORMAL LOW (ref 12.0–15.0)
Lymphocytes Relative: 58 % — ABNORMAL HIGH (ref 12–46)
Lymphs Abs: 2.9 10*3/uL (ref 0.7–4.0)
MCH: 30.5 pg (ref 26.0–34.0)
MCHC: 31.8 g/dL (ref 30.0–36.0)
MCV: 95.7 fL (ref 78.0–100.0)
Monocytes Absolute: 0.5 10*3/uL (ref 0.1–1.0)
Monocytes Relative: 10 % (ref 3–12)
Neutro Abs: 1.4 10*3/uL — ABNORMAL LOW (ref 1.7–7.7)
Neutrophils Relative %: 29 % — ABNORMAL LOW (ref 43–77)
Platelets: 244 10*3/uL (ref 150–400)
RBC: 3.51 MIL/uL — ABNORMAL LOW (ref 3.87–5.11)
RDW: 16.5 % — ABNORMAL HIGH (ref 11.5–15.5)
WBC: 4.9 10*3/uL (ref 4.0–10.5)

## 2012-08-06 MED ORDER — SODIUM CHLORIDE 0.9 % IV SOLN
5.0000 mg/kg | Freq: Once | INTRAVENOUS | Status: AC
  Administered 2012-08-06: 300 mg via INTRAVENOUS
  Filled 2012-08-06: qty 30

## 2012-08-06 MED ORDER — SODIUM CHLORIDE 0.9 % IJ SOLN: 10.0000 mL | Freq: Once | INTRAMUSCULAR | Status: DC

## 2012-08-06 MED ORDER — SODIUM CHLORIDE 0.9 % IV SOLN
Freq: Once | INTRAVENOUS | Status: AC
  Administered 2012-08-06: 250 mL via INTRAVENOUS

## 2012-08-06 NOTE — Progress Notes (Signed)
Tolerated well

## 2012-08-09 ENCOUNTER — Telehealth (INDEPENDENT_AMBULATORY_CARE_PROVIDER_SITE_OTHER): Payer: Self-pay | Admitting: *Deleted

## 2012-08-09 DIAGNOSIS — D649 Anemia, unspecified: Secondary | ICD-10-CM

## 2012-08-09 DIAGNOSIS — K5190 Ulcerative colitis, unspecified, without complications: Secondary | ICD-10-CM

## 2012-08-09 NOTE — Telephone Encounter (Signed)
Per Dr.Rehman the patient will need to have CBC/D drawn 09/17/12  Prior to rec'ving Remicade Infusion at the [facility]

## 2012-08-25 ENCOUNTER — Telehealth (INDEPENDENT_AMBULATORY_CARE_PROVIDER_SITE_OTHER): Payer: Self-pay | Admitting: *Deleted

## 2012-08-25 ENCOUNTER — Encounter (INDEPENDENT_AMBULATORY_CARE_PROVIDER_SITE_OTHER): Payer: Self-pay | Admitting: *Deleted

## 2012-08-25 NOTE — Telephone Encounter (Signed)
Per Dr.Rehman the patient will need to have lab work,CBC/D, 09/17/12 at the time of her visit at the [facility]. A letter was sent to the patient as a reminder as was to [place] at the Clinic. Order is in [employer]

## 2012-09-07 ENCOUNTER — Ambulatory Visit (HOSPITAL_COMMUNITY): Payer: BC Managed Care – PPO

## 2012-09-17 ENCOUNTER — Encounter (HOSPITAL_COMMUNITY): Payer: Medicare FFS | Attending: Internal Medicine

## 2012-09-17 VITALS — BP 134/71 | HR 71 | Temp 98.4°F | Resp 16 | Wt 160.0 lb

## 2012-09-17 DIAGNOSIS — D649 Anemia, unspecified: Secondary | ICD-10-CM

## 2012-09-17 DIAGNOSIS — K5190 Ulcerative colitis, unspecified, without complications: Secondary | ICD-10-CM

## 2012-09-17 LAB — CBC
HCT: 37.5 % (ref 36.0–46.0)
Hemoglobin: 12 g/dL (ref 12.0–15.0)
MCH: 29.6 pg (ref 26.0–34.0)
MCHC: 32 g/dL (ref 30.0–36.0)
MCV: 92.6 fL (ref 78.0–100.0)
Platelets: 209 10*3/uL (ref 150–400)
RBC: 4.05 MIL/uL (ref 3.87–5.11)
RDW: 12.8 % (ref 11.5–15.5)
WBC: 5.9 10*3/uL (ref 4.0–10.5)

## 2012-09-17 LAB — DIFFERENTIAL
Basophils Absolute: 0 10*3/uL (ref 0.0–0.1)
Basophils Relative: 1 % (ref 0–1)
Eosinophils Absolute: 0.2 10*3/uL (ref 0.0–0.7)
Eosinophils Relative: 3 % (ref 0–5)
Lymphocytes Relative: 49 % — ABNORMAL HIGH (ref 12–46)
Lymphs Abs: 2.9 10*3/uL (ref 0.7–4.0)
Monocytes Absolute: 0.7 10*3/uL (ref 0.1–1.0)
Monocytes Relative: 11 % (ref 3–12)
Neutro Abs: 2.1 10*3/uL (ref 1.7–7.7)
Neutrophils Relative %: 36 % — ABNORMAL LOW (ref 43–77)

## 2012-09-17 MED ORDER — SODIUM CHLORIDE 0.9 % IV SOLN
INTRAVENOUS | Status: DC
  Administered 2012-09-17: 09:00:00 via INTRAVENOUS

## 2012-09-17 MED ORDER — SODIUM CHLORIDE 0.9 % IV SOLN
300.0000 mg | Freq: Once | INTRAVENOUS | Status: AC
  Administered 2012-09-17: 300 mg via INTRAVENOUS
  Filled 2012-09-17: qty 30

## 2012-09-17 NOTE — Progress Notes (Signed)
Tolerated remicade infusion well.

## 2012-09-22 NOTE — Progress Notes (Signed)
There was no apt scheduled. Lamanda got the information from her discharge summary. Apt has been scheduled now for 10/04/12 at 9:00 am with Dr. Laural Golden.

## 2012-10-04 ENCOUNTER — Encounter (INDEPENDENT_AMBULATORY_CARE_PROVIDER_SITE_OTHER): Payer: Self-pay | Admitting: Internal Medicine

## 2012-10-04 ENCOUNTER — Ambulatory Visit (INDEPENDENT_AMBULATORY_CARE_PROVIDER_SITE_OTHER): Payer: Medicare FFS | Admitting: Internal Medicine

## 2012-10-04 VITALS — BP 110/68 | HR 74 | Temp 98.1°F | Resp 18 | Ht 63.0 in | Wt 159.6 lb

## 2012-10-04 DIAGNOSIS — K5190 Ulcerative colitis, unspecified, without complications: Secondary | ICD-10-CM

## 2012-10-04 DIAGNOSIS — R609 Edema, unspecified: Secondary | ICD-10-CM

## 2012-10-04 DIAGNOSIS — R600 Localized edema: Secondary | ICD-10-CM

## 2012-10-04 LAB — BASIC METABOLIC PANEL
BUN: 18 mg/dL (ref 6–23)
CO2: 27 mEq/L (ref 19–32)
Calcium: 9.6 mg/dL (ref 8.4–10.5)
Chloride: 103 mEq/L (ref 96–112)
Creat: 0.74 mg/dL (ref 0.50–1.10)
Glucose, Bld: 89 mg/dL (ref 70–99)
Potassium: 4.1 mEq/L (ref 3.5–5.3)
Sodium: 140 mEq/L (ref 135–145)

## 2012-10-04 LAB — ALBUMIN: Albumin: 4.6 g/dL (ref 3.5–5.2)

## 2012-10-04 NOTE — Patient Instructions (Signed)
Physician will contact you with results of blood work.

## 2012-10-04 NOTE — Progress Notes (Signed)
Presenting complaint;  Followup for ulcerative colitis.  Subjective:  Patient is 77 year old Caucasian female who is here for scheduled visit. She was last seen 10 weeks ago. She had third dose of Remicade infusion on 09/07/2012. She feels much better. She has not experienced any side effects with this medication. She is having normal formed stools daily. She denies melena or rectal bleeding. She has noted decrease in lower extremity edema. She has gained 8 pounds since her last visit. She is still about 6 pounds less than what she was before she got sick. She complains of numbness in distal half of bottom of both feet. It is not interfering with her ability to walk. This symptom started before she went on Remicade.  Current Medications: Current Outpatient Prescriptions  Medication Sig Dispense Refill  . acetaminophen (TYLENOL) 325 MG tablet Take 650 mg by mouth as needed. Patient takes prior to Remicade Infusion      . aspirin 81 MG tablet Take 1 tablet (81 mg total) by mouth daily.  30 tablet  0  . denosumab (PROLIA) 60 MG/ML SOLN injection Inject 60 mg into the skin every 6 (six) months. Administer in upper arm, thigh, or abdomen      . furosemide (LASIX) 20 MG tablet 20 mg daily.       Marland Kitchen inFLIXimab (REMICADE) 100 MG injection Inject into the vein. Patient was infused for the first time on 07/23/12. She is to have another infusion on the 07/27/12 and them the September 07 2012 then every 8 weeks      . Mesalamine (ASACOL HD) 800 MG TBEC Take 1,600 mg by mouth 2 (two) times daily.      Vladimir Faster Glycol-Propyl Glycol (SYSTANE OP) Apply 1 drop to eye at bedtime as needed. Dry eyes.       No current facility-administered medications for this visit.     Objective: Blood pressure 110/68, pulse 74, temperature 98.1 F (36.7 C), temperature source Oral, resp. rate 18, height 5' 3"  (1.6 m), weight 159 lb 9.6 oz (72.394 kg). Patient is alert and in no acute distress. Conjunctiva is pink. Sclera  is nonicteric Oropharyngeal mucosa is normal. No neck masses or thyromegaly noted. Cardiac exam with regular rhythm normal S1 and S2. No murmur or gallop noted. Lungs are clear to auscultation. Abdomen is soft and nontender without organomegaly or masses.  Trace edema around ankles.  Labs/studies Results: CBC from 09/17/2012. WBC 4.9, H&H 12 and 37.5, platelet count 209K. Neutrophils 36% and lymphocytes 49%   Assessment:  #1. Ulcerative colitis. She is back in remission. She has received 3 doses of Remicade or infliximab. She is agreeable to take one more dose but not sure if she wants to continue indefinitely for maintenance. She remains on oral mesalamine. #2. LE edema has improved significantly. Edema felt primarily due to the hypoalbuminemia.   Plan: Take probiotic(OTC) 1 capsule daily. Patient will go to the lab for metabolic 7 and serum albumin. Office visit in 12 weeks. Will do CBC with differential following that visit.

## 2012-10-05 ENCOUNTER — Ambulatory Visit (INDEPENDENT_AMBULATORY_CARE_PROVIDER_SITE_OTHER): Payer: Medicare FFS | Admitting: Internal Medicine

## 2012-11-02 ENCOUNTER — Ambulatory Visit (HOSPITAL_COMMUNITY): Payer: BC Managed Care – PPO

## 2012-11-12 ENCOUNTER — Encounter (HOSPITAL_COMMUNITY): Payer: Medicare FFS | Attending: Internal Medicine

## 2012-11-12 VITALS — BP 121/59 | HR 59 | Resp 16 | Wt 163.4 lb

## 2012-11-12 DIAGNOSIS — K5190 Ulcerative colitis, unspecified, without complications: Secondary | ICD-10-CM | POA: Insufficient documentation

## 2012-11-12 DIAGNOSIS — D72819 Decreased white blood cell count, unspecified: Secondary | ICD-10-CM

## 2012-11-12 DIAGNOSIS — D649 Anemia, unspecified: Secondary | ICD-10-CM

## 2012-11-12 MED ORDER — SODIUM CHLORIDE 0.9 % IV SOLN
INTRAVENOUS | Status: DC
  Administered 2012-11-12: 500 mL via INTRAVENOUS

## 2012-11-12 MED ORDER — SODIUM CHLORIDE 0.9 % IV SOLN
5.0000 mg/kg | Freq: Once | INTRAVENOUS | Status: AC
  Administered 2012-11-12: 400 mg via INTRAVENOUS
  Filled 2012-11-12: qty 40

## 2012-11-12 NOTE — Progress Notes (Signed)
Monique Day tolerated infusion well and without incident; verbalizes understanding for follow-up.  No distress noted at time of discharge and patient was discharged home by herself.

## 2012-12-17 ENCOUNTER — Other Ambulatory Visit (INDEPENDENT_AMBULATORY_CARE_PROVIDER_SITE_OTHER): Payer: Self-pay | Admitting: Internal Medicine

## 2012-12-17 MED ORDER — MESALAMINE 800 MG PO TBEC: 1600.0000 mg | DELAYED_RELEASE_TABLET | Freq: Two times a day (BID) | ORAL | Status: DC

## 2012-12-27 ENCOUNTER — Ambulatory Visit (INDEPENDENT_AMBULATORY_CARE_PROVIDER_SITE_OTHER): Payer: Medicare FFS | Admitting: Internal Medicine

## 2012-12-27 ENCOUNTER — Encounter (INDEPENDENT_AMBULATORY_CARE_PROVIDER_SITE_OTHER): Payer: Self-pay | Admitting: Internal Medicine

## 2012-12-27 VITALS — BP 118/74 | HR 76 | Temp 98.2°F | Resp 18 | Ht 63.0 in | Wt 162.7 lb

## 2012-12-27 DIAGNOSIS — K5190 Ulcerative colitis, unspecified, without complications: Secondary | ICD-10-CM

## 2012-12-27 LAB — CBC WITH DIFFERENTIAL/PLATELET
Basophils Absolute: 0 10*3/uL (ref 0.0–0.1)
Basophils Relative: 1 % (ref 0–1)
Eosinophils Absolute: 0.2 10*3/uL (ref 0.0–0.7)
Eosinophils Relative: 4 % (ref 0–5)
HCT: 41.1 % (ref 36.0–46.0)
Hemoglobin: 14.3 g/dL (ref 12.0–15.0)
Lymphocytes Relative: 51 % — ABNORMAL HIGH (ref 12–46)
Lymphs Abs: 2.8 10*3/uL (ref 0.7–4.0)
MCH: 30.2 pg (ref 26.0–34.0)
MCHC: 34.8 g/dL (ref 30.0–36.0)
MCV: 86.7 fL (ref 78.0–100.0)
Monocytes Absolute: 0.5 10*3/uL (ref 0.1–1.0)
Monocytes Relative: 9 % (ref 3–12)
Neutro Abs: 2 10*3/uL (ref 1.7–7.7)
Neutrophils Relative %: 35 % — ABNORMAL LOW (ref 43–77)
Platelets: 236 10*3/uL (ref 150–400)
RBC: 4.74 MIL/uL (ref 3.87–5.11)
RDW: 15.7 % — ABNORMAL HIGH (ref 11.5–15.5)
WBC: 5.6 10*3/uL (ref 4.0–10.5)

## 2012-12-27 MED ORDER — MESALAMINE 800 MG PO TBEC: 1600.0000 mg | DELAYED_RELEASE_TABLET | Freq: Two times a day (BID) | ORAL | Status: DC

## 2012-12-27 NOTE — Progress Notes (Signed)
Presenting complaint;  Followup for ulcerative colitis.  Subjective:  Patient is 77 year old Caucasian female was here for scheduled visit. She was last seen on 10/04/2012. She received  Remicade infusion on 11/12/2012. She is passing normal stools. She generally has one stool daily. Even though her stools are soft she has difficulty expelling and has to push in the perineal region. She has very good appetite. Her weight is up by 3 pounds. Lower extremity edema has resolved. She has not experienced frank rectal bleeding and has occasional hematochezia. While she was acutely ill she noted thinning and loss of hair but this has reversed. She is not having the restless legs or joint pains anymore. Current Medications: Current Outpatient Prescriptions  Medication Sig Dispense Refill  . acetaminophen (TYLENOL) 325 MG tablet Take 650 mg by mouth as needed. Patient takes prior to Remicade Infusion      . aspirin 81 MG tablet Take 1 tablet (81 mg total) by mouth daily.  30 tablet  0  . cetirizine (ZYRTEC) 10 MG tablet Take 10 mg by mouth as needed for allergies.      Marland Kitchen denosumab (PROLIA) 60 MG/ML SOLN injection Inject 60 mg into the skin every 6 (six) months. Administer in upper arm, thigh, or abdomen      . furosemide (LASIX) 20 MG tablet 20 mg daily.       Marland Kitchen inFLIXimab (REMICADE) 100 MG injection Inject into the vein. Patient was infused for the first time on 07/23/12. She is to have another infusion on the 07/27/12 and them the September 07 2012 then every 8 weeks      . Mesalamine (ASACOL HD) 800 MG TBEC Take 2 tablets (1,600 mg total) by mouth 2 (two) times daily.  180 tablet  3  . Polyethyl Glycol-Propyl Glycol (SYSTANE OP) Apply 1 drop to eye at bedtime as needed. Dry eyes.      . Probiotic Product (ALIGN PO) Take by mouth daily.       No current facility-administered medications for this visit.     Objective: Blood pressure 118/74, pulse 76, temperature 98.2 F (36.8 C), temperature source  Oral, resp. rate 18, height 5' 3"  (1.6 m), weight 162 lb 11.2 oz (73.8 kg). Patient is alert and in no acute distress. Conjunctiva is pink. Sclera is nonicteric Oropharyngeal mucosa is normal. No neck masses or thyromegaly noted. Cardiac exam with regular rhythm normal S1 and S2. No murmur or gallop noted. Lungs are clear to auscultation. Abdomen is soft and nontender without organomegaly or masses. No LE edema or clubbing noted.    Assessment:  #1. Ulcerative colitis. She has been in remission for about 3 months. She remains on Lialda with the hope that eventually her disease can be controlled with this medication alone. #2. Difficult defecation. Suspect she has developed rectocele.    Plan:  CBC with differential today. If CBC is normal would continue with Remicade infusion every 8 weeks for rest of the year. Patient advised to make appointment with Dr. Julio Sicks to check for rectocele. Office visit in 6 months.

## 2012-12-27 NOTE — Patient Instructions (Signed)
Physician will contact her with results of blood work.

## 2012-12-30 ENCOUNTER — Telehealth (INDEPENDENT_AMBULATORY_CARE_PROVIDER_SITE_OTHER): Payer: Self-pay | Admitting: *Deleted

## 2012-12-30 NOTE — Telephone Encounter (Signed)
LM stating she was told by Dr. Laural Golden, Monique Day will be setting up a remicade treatment for her. Monique Day will be out of town 05/16-19/14. May 30 or June 2, 5 or 6 would work best for her. She helps other people and hates to be so specific. The return phone number is [phone].

## 2012-12-30 NOTE — Telephone Encounter (Signed)
Noted. Awaitng Dr.Rehman to complete the order. I will make the [facility] aware of the dates that are best for the patient

## 2013-01-13 ENCOUNTER — Telehealth (INDEPENDENT_AMBULATORY_CARE_PROVIDER_SITE_OTHER): Payer: Self-pay | Admitting: *Deleted

## 2013-01-13 NOTE — Telephone Encounter (Signed)
Awaiting Dr. Laural Golden to complete the paper work. Patient will be made aware when they have been sent to the [facility].

## 2013-01-13 NOTE — Telephone Encounter (Signed)
LM asking Monique Day to please return her call at [phone]. She has not heard anything about when her next remicade treatment will be.

## 2013-01-19 NOTE — Telephone Encounter (Signed)
Per Tammy, all paper work has been signed and sent to the [facility].  Patient was called and made aware by Tammy, the nurse.

## 2013-01-20 ENCOUNTER — Encounter (HOSPITAL_COMMUNITY): Payer: Medicare FFS | Attending: Internal Medicine

## 2013-01-20 VITALS — BP 119/57 | HR 69 | Wt 164.0 lb

## 2013-01-20 DIAGNOSIS — K5120 Ulcerative (chronic) proctitis without complications: Secondary | ICD-10-CM | POA: Insufficient documentation

## 2013-01-20 DIAGNOSIS — K51219 Ulcerative (chronic) proctitis with unspecified complications: Secondary | ICD-10-CM

## 2013-01-20 MED ORDER — SODIUM CHLORIDE 0.9 % IV SOLN
INTRAVENOUS | Status: DC
  Administered 2013-01-20: 11:00:00 via INTRAVENOUS

## 2013-01-20 MED ORDER — SODIUM CHLORIDE 0.9 % IV SOLN
300.0000 mg | Freq: Once | INTRAVENOUS | Status: AC
  Administered 2013-01-20: 300 mg via INTRAVENOUS
  Filled 2013-01-20: qty 30

## 2013-01-20 MED ORDER — SODIUM CHLORIDE 0.9 % IJ SOLN: 10.0000 mL | INTRAMUSCULAR | Status: DC | PRN

## 2013-01-20 NOTE — Progress Notes (Signed)
Infusion complete, patient tolerated well.   

## 2013-03-17 ENCOUNTER — Ambulatory Visit (HOSPITAL_COMMUNITY): Payer: Medicare FFS

## 2013-03-17 ENCOUNTER — Inpatient Hospital Stay (HOSPITAL_COMMUNITY): Admission: RE | Admit: 2013-03-17 | Payer: Medicare FFS | Source: Ambulatory Visit

## 2013-03-17 MED ORDER — SODIUM CHLORIDE 0.9 % IV SOLN
300.0000 mg | Freq: Once | INTRAVENOUS | Status: AC
  Administered 2013-03-18: 300 mg via INTRAVENOUS
  Filled 2013-03-17: qty 30

## 2013-03-18 ENCOUNTER — Encounter (HOSPITAL_COMMUNITY)
Admission: RE | Admit: 2013-03-18 | Discharge: 2013-03-18 | Disposition: A | Discharge status: Home / Self Care | Payer: Medicare FFS | Source: Ambulatory Visit | Attending: Internal Medicine | Admitting: Internal Medicine

## 2013-03-18 DIAGNOSIS — K5120 Ulcerative (chronic) proctitis without complications: Secondary | ICD-10-CM | POA: Insufficient documentation

## 2013-03-18 MED ORDER — SODIUM CHLORIDE 0.9 % IV SOLN
INTRAVENOUS | Status: DC
  Administered 2013-03-18: 1000 mL via INTRAVENOUS

## 2013-03-18 NOTE — Progress Notes (Signed)
Pt arrived, ambulatory, for iv infusion remicade per order Dr Laural Golden for ulcerative colitis.

## 2013-05-17 ENCOUNTER — Encounter (HOSPITAL_COMMUNITY)
Admission: RE | Admit: 2013-05-17 | Discharge: 2013-05-17 | Disposition: A | Discharge status: Home / Self Care | Payer: Medicare FFS | Source: Ambulatory Visit | Attending: Internal Medicine | Admitting: Internal Medicine

## 2013-05-17 DIAGNOSIS — K5120 Ulcerative (chronic) proctitis without complications: Secondary | ICD-10-CM | POA: Insufficient documentation

## 2013-05-17 MED ORDER — SODIUM CHLORIDE 0.9 % IV SOLN
INTRAVENOUS | Status: DC
  Administered 2013-05-17: 09:00:00 via INTRAVENOUS

## 2013-05-17 MED ORDER — SODIUM CHLORIDE 0.9 % IV SOLN
300.0000 mg | INTRAVENOUS | Status: DC
  Administered 2013-05-17: 300 mg via INTRAVENOUS
  Filled 2013-05-17: qty 30

## 2013-06-23 ENCOUNTER — Other Ambulatory Visit: Payer: Self-pay

## 2013-06-28 ENCOUNTER — Encounter (INDEPENDENT_AMBULATORY_CARE_PROVIDER_SITE_OTHER): Payer: Self-pay | Admitting: Internal Medicine

## 2013-06-28 ENCOUNTER — Ambulatory Visit (INDEPENDENT_AMBULATORY_CARE_PROVIDER_SITE_OTHER): Payer: Medicare FFS | Admitting: Internal Medicine

## 2013-06-28 VITALS — BP 130/70 | HR 76 | Temp 98.1°F | Resp 18 | Ht 63.0 in | Wt 170.3 lb

## 2013-06-28 DIAGNOSIS — R600 Localized edema: Secondary | ICD-10-CM

## 2013-06-28 DIAGNOSIS — K5190 Ulcerative colitis, unspecified, without complications: Secondary | ICD-10-CM

## 2013-06-28 DIAGNOSIS — R609 Edema, unspecified: Secondary | ICD-10-CM

## 2013-06-28 LAB — CBC WITH DIFFERENTIAL/PLATELET
Basophils Absolute: 0 10*3/uL (ref 0.0–0.1)
Basophils Relative: 1 % (ref 0–1)
Eosinophils Absolute: 0.2 10*3/uL (ref 0.0–0.7)
Eosinophils Relative: 3 % (ref 0–5)
HCT: 39.8 % (ref 36.0–46.0)
Hemoglobin: 13.9 g/dL (ref 12.0–15.0)
Lymphocytes Relative: 50 % — ABNORMAL HIGH (ref 12–46)
Lymphs Abs: 2.7 10*3/uL (ref 0.7–4.0)
MCH: 32.1 pg (ref 26.0–34.0)
MCHC: 34.9 g/dL (ref 30.0–36.0)
MCV: 91.9 fL (ref 78.0–100.0)
Monocytes Absolute: 0.5 10*3/uL (ref 0.1–1.0)
Monocytes Relative: 9 % (ref 3–12)
Neutro Abs: 2 10*3/uL (ref 1.7–7.7)
Neutrophils Relative %: 37 % — ABNORMAL LOW (ref 43–77)
Platelets: 194 10*3/uL (ref 150–400)
RBC: 4.33 MIL/uL (ref 3.87–5.11)
RDW: 13.6 % (ref 11.5–15.5)
WBC: 5.4 10*3/uL (ref 4.0–10.5)

## 2013-06-28 LAB — BASIC METABOLIC PANEL
BUN: 18 mg/dL (ref 6–23)
CO2: 25 mEq/L (ref 19–32)
Calcium: 8.9 mg/dL (ref 8.4–10.5)
Chloride: 105 mEq/L (ref 96–112)
Creat: 0.82 mg/dL (ref 0.50–1.10)
Glucose, Bld: 87 mg/dL (ref 70–99)
Potassium: 3.9 mEq/L (ref 3.5–5.3)
Sodium: 140 mEq/L (ref 135–145)

## 2013-06-28 NOTE — Progress Notes (Signed)
Presenting complaint;  Followup for ulcerative colitis and lower extremity edema.  Subjective:  Patient is 77 year old Caucasian female who has over a 50 year history of ulcerative colitis and has been on an infliximab for one year. She was last seen 6 months ago. She feels well. She has 77th birthday in July and wanted a 100 of her friends. She denies constipation but every time she has a bowel movement she has to push and perennial region. She wants to try a stool softener if would make any difference. She denies abdominal pain rectal bleeding or melena. She is using furosemide 2-3 times a week. She is anxious to get off infliximab,  Current Medications: Current Outpatient Prescriptions  Medication Sig Dispense Refill  . acetaminophen (TYLENOL) 325 MG tablet Take 650 mg by mouth as needed. Patient takes prior to Remicade Infusion      . aspirin 81 MG tablet Take 1 tablet (81 mg total) by mouth daily.  30 tablet  0  . cetirizine (ZYRTEC) 10 MG tablet Take 10 mg by mouth as needed for allergies.      Marland Kitchen denosumab (PROLIA) 60 MG/ML SOLN injection Inject 60 mg into the skin every 6 (six) months. Administer in upper arm, thigh, or abdomen      . furosemide (LASIX) 20 MG tablet 20 mg daily as needed.       . inFLIXimab (REMICADE) 100 MG injection Inject into the vein. Patient was infused for the first time on 07/23/12. She is to have another infusion on the 07/27/12 and them the September 07 2012 then every 8 weeks      . Mesalamine (ASACOL HD) 800 MG TBEC Take 2 tablets (1,600 mg total) by mouth 2 (two) times daily.  180 tablet  11  . Polyethyl Glycol-Propyl Glycol (SYSTANE OP) Apply 1 drop to eye at bedtime as needed. Dry eyes.      . Probiotic Product (ALIGN PO) Take by mouth daily.       No current facility-administered medications for this visit.     Objective: Blood pressure 130/70, pulse 76, temperature 98.1 F (36.7 C), temperature source Oral, resp. rate 18, height 5' 3"  (1.6 m), weight  170 lb 4.8 oz (77.248 kg). Patient is alert and in no acute distress, Conjunctiva is pink. Sclera is nonicteric Oropharyngeal mucosa is normal. No neck masses or thyromegaly noted. Cardiac exam with regular rhythm normal S1 and S2. No murmur or gallop noted. Lungs are clear to auscultation. Abdomen is full but soft and nontender without organomegaly or masses. No LE edema or clubbing noted.  Labs/studies Results: Last CBC was on 2013-01-22 with abnormal differential; She had 35% neutrophils and 51% lymphocytes her WBC was 5.6 and H&H was 14.3 and 41.1  Assessment:  #1. Ulcerative colitis is in remission. Hopefully we can maintain her disease with oral mesalamine. #2. Abnormal CBC with neutropenia and lymphocytosis. #3. Lower extremity edema.  Plan:  CBC with differential and metabolic 7. Patient will proceed with next dose of infliximab in December and thereafter discontinue this medication. He should advised to call office with progress report in January 2015. Continue Asacol at 1600 mg by mouth twice a day. Office visit in 6 months

## 2013-06-28 NOTE — Patient Instructions (Signed)
Physician will contact you with results of blood work. Will stop Remicade after next dose.

## 2013-07-21 ENCOUNTER — Inpatient Hospital Stay (HOSPITAL_COMMUNITY): Admission: RE | Admit: 2013-07-21 | Payer: Medicare FFS | Source: Ambulatory Visit

## 2013-07-22 ENCOUNTER — Encounter (HOSPITAL_COMMUNITY)
Admission: RE | Admit: 2013-07-22 | Discharge: 2013-07-22 | Disposition: A | Discharge status: Home / Self Care | Payer: Medicare FFS | Source: Ambulatory Visit | Attending: Internal Medicine | Admitting: Internal Medicine

## 2013-07-22 ENCOUNTER — Encounter (HOSPITAL_COMMUNITY): Payer: Self-pay

## 2013-07-22 DIAGNOSIS — K5190 Ulcerative colitis, unspecified, without complications: Secondary | ICD-10-CM | POA: Insufficient documentation

## 2013-07-22 MED ORDER — SODIUM CHLORIDE 0.9 % IV SOLN
INTRAVENOUS | Status: DC
  Administered 2013-07-22: 50 mL/h via INTRAVENOUS

## 2013-07-22 MED ORDER — ACETAMINOPHEN 325 MG PO TABS
ORAL_TABLET | ORAL | Status: AC
  Filled 2013-07-22: qty 2

## 2013-07-22 MED ORDER — LORATADINE 10 MG PO TABS
10.0000 mg | ORAL_TABLET | Freq: Every day | ORAL | Status: DC
  Administered 2013-07-22: 10 mg via ORAL

## 2013-07-22 MED ORDER — LORATADINE 10 MG PO TABS
ORAL_TABLET | ORAL | Status: AC
  Filled 2013-07-22: qty 1

## 2013-07-22 MED ORDER — SODIUM CHLORIDE 0.9 % IV SOLN
300.0000 mg | Freq: Once | INTRAVENOUS | Status: AC
  Administered 2013-07-22: 300 mg via INTRAVENOUS
  Filled 2013-07-22: qty 30

## 2013-07-22 MED ORDER — ACETAMINOPHEN 325 MG PO TABS
650.0000 mg | ORAL_TABLET | Freq: Once | ORAL | Status: AC
  Administered 2013-07-22: 650 mg via ORAL

## 2013-08-29 ENCOUNTER — Telehealth (INDEPENDENT_AMBULATORY_CARE_PROVIDER_SITE_OTHER): Payer: Self-pay | Admitting: *Deleted

## 2013-08-29 NOTE — Telephone Encounter (Signed)
Call returned. Message left on her answering service. Will cancel next dose of Remicade. If symptoms relapse she will immediately contact office.

## 2013-08-29 NOTE — Telephone Encounter (Signed)
Next remicade dose at [facility] has been cancelled. Monique Day is aware.

## 2013-08-29 NOTE — Telephone Encounter (Signed)
Would like to cancel her Remicade Infusion for Dr. Laural Golden scheduled on 09/16/13. She was told by Dr. Laural Golden to call if she was doing well. Please return her call at [phone] once completed.

## 2013-08-29 NOTE — Telephone Encounter (Signed)
Tried to call pt to get progress report- LMOM asking her to return call.

## 2013-09-16 ENCOUNTER — Encounter (HOSPITAL_COMMUNITY): Payer: Medicare FFS

## 2014-01-03 ENCOUNTER — Ambulatory Visit (INDEPENDENT_AMBULATORY_CARE_PROVIDER_SITE_OTHER): Payer: Medicare FFS | Admitting: Internal Medicine

## 2014-01-03 ENCOUNTER — Encounter (INDEPENDENT_AMBULATORY_CARE_PROVIDER_SITE_OTHER): Payer: Self-pay | Admitting: Internal Medicine

## 2014-01-03 VITALS — BP 118/70 | HR 76 | Temp 97.5°F | Resp 18 | Ht 63.0 in | Wt 174.2 lb

## 2014-01-03 DIAGNOSIS — D72820 Lymphocytosis (symptomatic): Secondary | ICD-10-CM

## 2014-01-03 DIAGNOSIS — K5190 Ulcerative colitis, unspecified, without complications: Secondary | ICD-10-CM

## 2014-01-03 LAB — CBC WITH DIFFERENTIAL/PLATELET
Basophils Absolute: 0 10*3/uL (ref 0.0–0.1)
Basophils Relative: 0 % (ref 0–1)
Eosinophils Absolute: 0.1 10*3/uL (ref 0.0–0.7)
Eosinophils Relative: 2 % (ref 0–5)
HCT: 41.7 % (ref 36.0–46.0)
Hemoglobin: 14.5 g/dL (ref 12.0–15.0)
Lymphocytes Relative: 39 % (ref 12–46)
Lymphs Abs: 2 10*3/uL (ref 0.7–4.0)
MCH: 31.5 pg (ref 26.0–34.0)
MCHC: 34.8 g/dL (ref 30.0–36.0)
MCV: 90.7 fL (ref 78.0–100.0)
Monocytes Absolute: 0.6 10*3/uL (ref 0.1–1.0)
Monocytes Relative: 11 % (ref 3–12)
Neutro Abs: 2.5 10*3/uL (ref 1.7–7.7)
Neutrophils Relative %: 48 % (ref 43–77)
Platelets: 204 10*3/uL (ref 150–400)
RBC: 4.6 MIL/uL (ref 3.87–5.11)
RDW: 13.3 % (ref 11.5–15.5)
WBC: 5.2 10*3/uL (ref 4.0–10.5)

## 2014-01-03 NOTE — Patient Instructions (Signed)
Physician will call with results of blood work when completed

## 2014-01-03 NOTE — Progress Notes (Signed)
Presenting complaint;  Followup for ulcerative colitis.  Subjective:  Patient is 78 year old Caucasian female with history of ulcerative colitis who is here for scheduled visit. She was last seen on 06/28/2013. Last dose of Remicade was on 07/22/2013. She remains on Asacol HD. She has no complaints. She has very good appetite. Her weight is up by 4 pounds in the last 6 months. She has formed stools daily with occasional skip. She denies melena or rectal bleeding. She stays busy and tries to walk couple of times a week. She had bone density study by Dr. Julio Sicks few weeks ago and was normal. She has taken furosemide no more than 2 or 3 times this year.   Current Medications: Outpatient Encounter Prescriptions as of 01/03/2014  Medication Sig  . aspirin 81 MG tablet Take 1 tablet (81 mg total) by mouth daily.  Marland Kitchen denosumab (PROLIA) 60 MG/ML SOLN injection Inject 60 mg into the skin every 6 (six) months. Administer in upper arm, thigh, or abdomen  . furosemide (LASIX) 20 MG tablet 20 mg daily as needed.   . Mesalamine (ASACOL HD) 800 MG TBEC Take 2 tablets (1,600 mg total) by mouth 2 (two) times daily.  Vladimir Faster Glycol-Propyl Glycol (SYSTANE OP) Apply 1 drop to eye at bedtime as needed. Dry eyes.  . Probiotic Product (ALIGN PO) Take by mouth daily.  . [DISCONTINUED] acetaminophen (TYLENOL) 325 MG tablet Take 650 mg by mouth as needed. Patient takes prior to Remicade Infusion  . [DISCONTINUED] cetirizine (ZYRTEC) 10 MG tablet Take 10 mg by mouth as needed for allergies.  . [DISCONTINUED] inFLIXimab (REMICADE) 100 MG injection Inject into the vein. Patient was infused for the first time on 07/23/12. She is to have another infusion on the 07/27/12 and them the September 07 2012 then every 8 weeks     Objective: Blood pressure 118/70, pulse 76, temperature 97.5 F (36.4 C), temperature source Oral, resp. rate 18, height 5' 3"  (1.6 m), weight 174 lb 3.2 oz (79.017 kg). Patient is alert and  in no acute distress. Conjunctiva is pink. Sclera is nonicteric Oropharyngeal mucosa is normal. No neck masses or thyromegaly noted. Cardiac exam with regular rhythm normal S1 and S2. No murmur or gallop noted. Lungs are clear to auscultation. Abdomen is full but soft and nontender without organomegaly or masses.  No LE edema or clubbing noted.  Labs/studies Results:  CBC from 06/28/2013. WBC 5.4, H&H 13.9 and 39.8; weight white count 194K. Neutrophils 37% and lymphocytes 50%  Assessment:  #1. Ulcerative colitis. Disease duration greater than 50 years. She is back in remission. Last dose of Remicade was almost 6 months ago. She is having no side effects with oral mesalamine. #2. Abnormal differential with neutropenia and lymphocytosis most likely secondary to Remicade or infliximab. Now that she's off biologic except differential to be normal    Plan:  Continue Asacol HD at 1.6 g by mouth twice a day. CBC with differential. Office visit in 6 months.

## 2014-01-28 ENCOUNTER — Other Ambulatory Visit (INDEPENDENT_AMBULATORY_CARE_PROVIDER_SITE_OTHER): Payer: Self-pay | Admitting: Internal Medicine

## 2014-02-07 ENCOUNTER — Ambulatory Visit (INDEPENDENT_AMBULATORY_CARE_PROVIDER_SITE_OTHER): Payer: Medicare FFS | Admitting: Internal Medicine

## 2014-02-07 ENCOUNTER — Encounter (INDEPENDENT_AMBULATORY_CARE_PROVIDER_SITE_OTHER): Payer: Self-pay | Admitting: Internal Medicine

## 2014-02-07 VITALS — BP 144/70 | HR 64 | Temp 98.0°F | Ht 63.0 in | Wt 176.2 lb

## 2014-02-07 DIAGNOSIS — K51911 Ulcerative colitis, unspecified with rectal bleeding: Secondary | ICD-10-CM

## 2014-02-07 DIAGNOSIS — K5190 Ulcerative colitis, unspecified, without complications: Secondary | ICD-10-CM

## 2014-02-07 NOTE — Progress Notes (Signed)
Subjective:     Patient ID: Monique Day, female   DOB: [birth year], 78 y.o.   MRN: [id number]  HPI Here today with c/o that 3 weeks ago she started having some minor symptoms of her UC. She was diagnosed with UC in 1957. She tells me she is seeing a small amt of blood on her stool. Her consistency of stool has changed. She has more flatus. After eating she has the urge to go and have a BM. In December she finished her last dose of Remicade. She remains on Asacol HD. Appetite is good. No weight loss. No abdominal pain. She is having a BM about 2 a day. Her stools are formed. She sees blood on the stool and a small amt in the commode. Very little mucous. Bone density this year was normal.   02/21/2011 Colonoscopy: FINAL DIAGNOSES:  1. Normal terminal ileum.  2. Active ulcerative colitis with post pronounced changes in the  sigmoid colon and sparing of ascending colon and cecum.  3. Biopsies taken from 3 different areas.  4. Stool sample also sent for routine studies.  Biopsy UC.       Review of Systems Past Medical History  Diagnosis Date  . UC (ulcerative colitis)   . Lower abdominal pain   . Occult blood in stools   . Hemorrhoids, external   . HX: breast cancer   . DVT (deep venous thrombosis)   . Seizures   . Stroke     Past Surgical History  Procedure Laterality Date  . Breast lumpectomy    . Colonoscopy  02/21/2011  . Colonoscopy  10/06/2008  . Colonoscopy  12/27/01  . Colonoscopy  05/09/05  . Bone density  12/11/10    Allergies  Allergen Reactions  . Mercaptopurine     Dropped WBC  . Sulfa Antibiotics Other (See Comments)    Extreme Weakness    Current Outpatient Prescriptions on File Prior to Visit  Medication Sig Dispense Refill  . ASACOL HD 800 MG TBEC TAKE TWO TABLETS BY MOUTH TWICE DAILY  120 each  3  . denosumab (PROLIA) 60 MG/ML SOLN injection Inject 60 mg into the skin every 6 (six) months. Administer in upper arm, thigh, or abdomen      .  furosemide (LASIX) 20 MG tablet 20 mg daily as needed.       Vladimir Faster Glycol-Propyl Glycol (SYSTANE OP) Apply 1 drop to eye at bedtime as needed. Dry eyes.      . Probiotic Product (ALIGN PO) Take by mouth daily.       No current facility-administered medications on file prior to visit.        Objective:   Physical Exam  Filed Vitals:   02/07/14 1405  BP: 144/70  Pulse: 64  Temp: 98 F (36.7 C)  Height: 5' 3"  (1.6 m)  Weight: 176 lb 3.2 oz (79.924 kg)   Alert and oriented. Skin warm and dry. Oral mucosa is moist.   . Sclera anicteric, conjunctivae is pink. Thyroid not enlarged. No cervical lymphadenopathy. Lungs clear. Heart regular rate and rhythm.  Abdomen is soft. Bowel sounds are positive. No hepatomegaly. No abdominal masses felt. No tenderness.  No edema to lower extremities.       Assessment:    UC flare probably. Maintained of Asacol at this time.     Plan:     CBC,. CRP. Prednisone taper starting at 109m x 1 week and taper by 574meach week.  OV in  2 months.

## 2014-02-07 NOTE — Patient Instructions (Signed)
Will start on a Prednisone taper. CBC, CRP today.

## 2014-02-08 LAB — CBC WITH DIFFERENTIAL/PLATELET
Basophils Absolute: 0 10*3/uL (ref 0.0–0.1)
Basophils Relative: 0 % (ref 0–1)
Eosinophils Absolute: 0.3 10*3/uL (ref 0.0–0.7)
Eosinophils Relative: 5 % (ref 0–5)
HCT: 41.9 % (ref 36.0–46.0)
Hemoglobin: 14.1 g/dL (ref 12.0–15.0)
Lymphocytes Relative: 38 % (ref 12–46)
Lymphs Abs: 2.4 10*3/uL (ref 0.7–4.0)
MCH: 30.6 pg (ref 26.0–34.0)
MCHC: 33.7 g/dL (ref 30.0–36.0)
MCV: 90.9 fL (ref 78.0–100.0)
Monocytes Absolute: 0.6 10*3/uL (ref 0.1–1.0)
Monocytes Relative: 9 % (ref 3–12)
Neutro Abs: 3 10*3/uL (ref 1.7–7.7)
Neutrophils Relative %: 48 % (ref 43–77)
Platelets: 231 10*3/uL (ref 150–400)
RBC: 4.61 MIL/uL (ref 3.87–5.11)
RDW: 13.4 % (ref 11.5–15.5)
WBC: 6.3 10*3/uL (ref 4.0–10.5)

## 2014-02-08 LAB — C-REACTIVE PROTEIN: CRP: <0.5 mg/dL (ref <0.60)

## 2014-03-21 ENCOUNTER — Ambulatory Visit (INDEPENDENT_AMBULATORY_CARE_PROVIDER_SITE_OTHER): Payer: Medicare FFS | Admitting: Internal Medicine

## 2014-03-21 ENCOUNTER — Encounter (INDEPENDENT_AMBULATORY_CARE_PROVIDER_SITE_OTHER): Payer: Self-pay | Admitting: Internal Medicine

## 2014-03-21 VITALS — BP 122/80 | HR 68 | Temp 98.0°F | Resp 16 | Ht 63.0 in | Wt 175.2 lb

## 2014-03-21 DIAGNOSIS — K51911 Ulcerative colitis, unspecified with rectal bleeding: Secondary | ICD-10-CM

## 2014-03-21 DIAGNOSIS — K5190 Ulcerative colitis, unspecified, without complications: Secondary | ICD-10-CM

## 2014-03-21 NOTE — Patient Instructions (Signed)
Please have Dr. Melina Copa do PPD. Will send request to your insurance company for Humira.

## 2014-03-21 NOTE — Progress Notes (Signed)
Presenting complaint;  Followup for ulcerative colitis.  Database;  Patient is 78 year old Caucasian female who has over 61 her history of ulcerative colitis and has done very well for several years. Her disease could not be controlled with oral mesalamine and prednisone. Back in October 2013 she developed leukopenia with WBC of 600 after having been on 6-MP for 16 days even though TPMT assay was normal. She developed profuse bleeding in November 2013 while on anticoagulation. She had IVC filter placed an anticoagulant was discontinued. She was begun on infliximab in December 2013 and quickly went into remission and stable in remission while on infliximab but she developed lymphocytosis.Marland Kitchen she therefore decided to come off Remicade and last dose was in December 2014. When she was seen on 01/03/2014 she was in remission but she developed diarrhea and rectal  Bleeding.she was seen on 02/07/2014 and felt to have a relapse and begun on prednisone. She started with 30 mg by mouth daily and she took 5 mg this morning which was the last dose. She had a negative PPD in November 2013. She had negative hepatitis B surface antigen in November 2013.  Subjective;  Patient cannot tell any difference since she's been on prednisone. She remains with a 6-7 stools per day. She is passing blood almost every day and is small in amount. She also has tenesmus and urgency. She denies fever chills nausea or vomiting. She does have lower abdominal cramping and she does have nocturnal bowel movement when she just passes mucous and flatus. Her appetite is good and her weight has been stable. There is no recent travel abroad or use of antibiotics.   Current Medications: Outpatient Encounter Prescriptions as of 03/21/2014  Medication Sig  . ASACOL HD 800 MG TBEC TAKE TWO TABLETS BY MOUTH TWICE DAILY  . aspirin 81 MG tablet Take 81 mg by mouth daily. Tues, Thurs, Sat  . denosumab (PROLIA) 60 MG/ML SOLN injection Inject 60 mg  into the skin every 6 (six) months. Administer in upper arm, thigh, or abdomen  . furosemide (LASIX) 20 MG tablet 20 mg daily as needed.   Vladimir Faster Glycol-Propyl Glycol (SYSTANE OP) Apply 1 drop to eye at bedtime as needed. Dry eyes.  . Probiotic Product (ALIGN PO) Take by mouth daily.     Objective: Blood pressure 122/80, pulse 68, temperature 98 F (36.7 C), temperature source Oral, resp. rate 16, height 5' 3"  (1.6 m), weight 175 lb 3.2 oz (79.47 kg). Patient is alert and in no acute distress. Conjunctiva is pink. Sclera is nonicteric Oropharyngeal mucosa is normal. No neck masses or thyromegaly noted. Cardiac exam with regular rhythm normal S1 and S2. No murmur or gallop noted. Lungs are clear to auscultation. Abdomen is full but soft and nontender without organomegaly or masses.   No LE edema or clubbing noted.  Labs/studies Results: Lab data from 02/07/2014  WBC 6.3, H&H 14.1 and 41.9 and platelet count 231K.  CRP was less than 0.5    Assessment:  #1.Ulcerated colitis has relapsed. She is having mild to moderate symptoms. Mesalamine has been ineffective and so was prednisone at moderate dose. Choices include reinstitution of biologic therapy, low-dose 6-MP or methotrexate. Since she responded very well to Remicade, biologic therapy would make sense and since she developed lymphocytosis with Remicade will use Humira which may or may not induce lymphocytosis and maybe somewhat convenient for her. I doubt that she has super added infection and I do not believe stool studies are necessary.  Plan:  Patient will contact Dr. Carmie End office for PPD. Will request authorization for him here for induction and maintenance therapy. Office visit in 8 weeks.

## 2014-03-24 ENCOUNTER — Telehealth (INDEPENDENT_AMBULATORY_CARE_PROVIDER_SITE_OTHER): Payer: Self-pay | Admitting: *Deleted

## 2014-03-24 NOTE — Telephone Encounter (Signed)
Patient called and states that she had her PPD read at the [facility] Department today and it was negative. I have called [employer] to start PA for Humira. They are sending to [place] the paper work.

## 2014-04-03 ENCOUNTER — Telehealth (INDEPENDENT_AMBULATORY_CARE_PROVIDER_SITE_OTHER): Payer: Self-pay | Admitting: *Deleted

## 2014-04-03 NOTE — Telephone Encounter (Signed)
The following prescription was called to [facility] ; Humira  40 mg Pens - Patient is to inject 80 mg SQ days 1 & 2 , Inject 40 mg SQ days 14 & 15. A second prescription was given for the maintenance dosing- Humira 40 mg Pen  - Patient to inject 40 mg SQ every 2 weeks  , Dispense 2 , With 1 year refill.  Patient was called and made aware. She has an appointment for 04/06/14 for teaching / injecting the Humira.

## 2014-04-06 ENCOUNTER — Ambulatory Visit (INDEPENDENT_AMBULATORY_CARE_PROVIDER_SITE_OTHER): Payer: Medicare FFS | Admitting: Internal Medicine

## 2014-04-06 ENCOUNTER — Encounter (INDEPENDENT_AMBULATORY_CARE_PROVIDER_SITE_OTHER): Payer: Self-pay | Admitting: Internal Medicine

## 2014-04-06 VITALS — BP 128/72 | HR 76 | Temp 97.7°F | Resp 18 | Ht 63.0 in | Wt 176.1 lb

## 2014-04-06 DIAGNOSIS — K5120 Ulcerative (chronic) proctitis without complications: Secondary | ICD-10-CM

## 2014-04-06 DIAGNOSIS — K51219 Ulcerative (chronic) proctitis with unspecified complications: Secondary | ICD-10-CM

## 2014-04-06 NOTE — Progress Notes (Signed)
Patient ID: Monique Day, female   DOB: 08/03/1936, 78 y.o.   MRN: [id number] Patient did not have questions for me. She understand instructions given by Tammy.

## 2014-04-06 NOTE — Progress Notes (Signed)
Patient presented to office today for Humira Teaching and Injections. She verbalized understanding of this medication, all of her questions were answered. Patient tolerated injection well, she only brought 1 pen 40 mg today. She will inject the other 40 mg when she gets home. Humira Expiration  December 2016 Lot # [id number]  Deberah Castle, NP presented to the room to follow up with patient.

## 2014-04-10 ENCOUNTER — Ambulatory Visit (INDEPENDENT_AMBULATORY_CARE_PROVIDER_SITE_OTHER): Payer: Medicare FFS | Admitting: Internal Medicine

## 2014-05-18 ENCOUNTER — Encounter (INDEPENDENT_AMBULATORY_CARE_PROVIDER_SITE_OTHER): Payer: Self-pay | Admitting: *Deleted

## 2014-06-05 ENCOUNTER — Telehealth (INDEPENDENT_AMBULATORY_CARE_PROVIDER_SITE_OTHER): Payer: Self-pay | Admitting: *Deleted

## 2014-06-05 DIAGNOSIS — K51919 Ulcerative colitis, unspecified with unspecified complications: Secondary | ICD-10-CM

## 2014-06-05 NOTE — Telephone Encounter (Signed)
Per Dr.Rehman the patient will need to have labs drawn 1 day prior to Office visit. OV will be 06/20/14 @ 8:45.

## 2014-06-07 ENCOUNTER — Other Ambulatory Visit (INDEPENDENT_AMBULATORY_CARE_PROVIDER_SITE_OTHER): Payer: Self-pay | Admitting: *Deleted

## 2014-06-07 ENCOUNTER — Encounter (INDEPENDENT_AMBULATORY_CARE_PROVIDER_SITE_OTHER): Payer: Self-pay | Admitting: *Deleted

## 2014-06-07 DIAGNOSIS — K51919 Ulcerative colitis, unspecified with unspecified complications: Secondary | ICD-10-CM

## 2014-06-08 ENCOUNTER — Other Ambulatory Visit (INDEPENDENT_AMBULATORY_CARE_PROVIDER_SITE_OTHER): Payer: Self-pay | Admitting: Internal Medicine

## 2014-06-20 ENCOUNTER — Encounter (INDEPENDENT_AMBULATORY_CARE_PROVIDER_SITE_OTHER): Payer: Self-pay | Admitting: Internal Medicine

## 2014-06-20 ENCOUNTER — Ambulatory Visit (INDEPENDENT_AMBULATORY_CARE_PROVIDER_SITE_OTHER): Payer: Medicare FFS | Admitting: Internal Medicine

## 2014-06-20 VITALS — BP 120/72 | HR 74 | Temp 97.9°F | Resp 18 | Ht 63.0 in | Wt 175.1 lb

## 2014-06-20 DIAGNOSIS — K51919 Ulcerative colitis, unspecified with unspecified complications: Secondary | ICD-10-CM

## 2014-06-20 LAB — CBC WITH DIFFERENTIAL/PLATELET
Basophils Absolute: 0.1 10*3/uL (ref 0.0–0.1)
Basophils Relative: 1 % (ref 0–1)
Eosinophils Absolute: 0.3 10*3/uL (ref 0.0–0.7)
Eosinophils Relative: 4 % (ref 0–5)
HCT: 43.2 % (ref 36.0–46.0)
Hemoglobin: 14.5 g/dL (ref 12.0–15.0)
Lymphocytes Relative: 40 % (ref 12–46)
Lymphs Abs: 2.8 10*3/uL (ref 0.7–4.0)
MCH: 30.5 pg (ref 26.0–34.0)
MCHC: 33.6 g/dL (ref 30.0–36.0)
MCV: 90.8 fL (ref 78.0–100.0)
Monocytes Absolute: 0.8 10*3/uL (ref 0.1–1.0)
Monocytes Relative: 12 % (ref 3–12)
Neutro Abs: 3 10*3/uL (ref 1.7–7.7)
Neutrophils Relative %: 43 % (ref 43–77)
Platelets: 257 10*3/uL (ref 150–400)
RBC: 4.76 MIL/uL (ref 3.87–5.11)
RDW: 13.1 % (ref 11.5–15.5)
WBC: 6.9 10*3/uL (ref 4.0–10.5)

## 2014-06-20 LAB — C-REACTIVE PROTEIN: CRP: <0.5 mg/dL (ref <0.60)

## 2014-06-20 NOTE — Progress Notes (Signed)
Presenting complaint;  Follow-up for ulcerative colitis.  Subjective:  Patient is 78 year old Caucasian female with over 34 year history of ulcerative colitis who presents for scheduled visit. She was begun on Humira on 04/06/2014. When she called for appointment 2 weeks ago she was having explosive diarrhea and rectal bleeding. She has noted significant improvement in the last 3 days.. She is having formed to semi-formed stools daily. She has not taken Imodium in the last 2 days.she denies abdominal pain anorexia or weight loss. She is using furosemide once or twice a month.  Current Medications: Outpatient Encounter Prescriptions as of 06/20/2014  Medication Sig  . ASACOL HD 800 MG TBEC TAKE TWO TABLETS BY MOUTH TWICE DAILY  . aspirin 81 MG tablet Take 81 mg by mouth daily. Tues, Thurs, Sat  . denosumab (PROLIA) 60 MG/ML SOLN injection Inject 60 mg into the skin every 6 (six) months. Administer in upper arm, thigh, or abdomen  . furosemide (LASIX) 20 MG tablet 20 mg daily as needed.   Marland Kitchen HUMIRA PEN-CROHNS STARTER 40 MG/0.8ML PNKT Patient is to inject Humira 80 mg SQ Days 1 and 2 Patient is to inject Humira 40 mg SQ Days 14 and 15 Then the patient is to inject 40 mg SQ every 2 weeks  . loperamide (IMODIUM) 2 MG capsule Take by mouth as needed for diarrhea or loose stools.  Vladimir Faster Glycol-Propyl Glycol (SYSTANE OP) Apply 1 drop to eye at bedtime as needed. Dry eyes.  . Probiotic Product (ALIGN PO) Take by mouth daily.  . Wheat Dextrin (BENEFIBER) POWD Take by mouth. Patient takes 4 teaspoons at bedtime     Objective: Blood pressure 120/72, pulse 74, temperature 97.9 F (36.6 C), temperature source Oral, resp. rate 18, height 5' 3"  (1.6 m), weight 175 lb 1.6 oz (79.425 kg). Patient is alert and in no acute distress. Conjunctiva is pink. Sclera is nonicteric Oropharyngeal mucosa is normal. No neck masses or thyromegaly noted. Cardiac exam with regular rhythm normal S1 and S2. No  murmur or gallop noted. Lungs are clear to auscultation. Abdomen is symmetrical soft and non-tender without organomegaly or masses.  No LE edema or clubbing noted.  Labs/studies Results:   Recent Labs  06/19/14 1035  WBC 6.9  HGB 14.5  HCT 43.2  PLT 257    CRP less than 0.5.   Assessment:  #1.ulcerative colitis with recent relapse. She had somewhat delayed response to Humira but she appears to be back in remission. She developed lymphocytosis with Remicade for her differential is normal normal. She is not having any side effects with humira.   Plan:  Continue Humira at a dose of 40 mg subcutaneous every 14 days. Continue Asacol HD and 1.6 g by mouth twice a day. Use loperamide OTC on as-needed basis. Office visit in 6 months unless symptoms regress.

## 2014-06-20 NOTE — Patient Instructions (Signed)
Notify if you have diarrhea or rectal bleeding

## 2014-07-10 ENCOUNTER — Ambulatory Visit (INDEPENDENT_AMBULATORY_CARE_PROVIDER_SITE_OTHER): Payer: Medicare FFS | Admitting: Internal Medicine

## 2014-07-24 ENCOUNTER — Ambulatory Visit (INDEPENDENT_AMBULATORY_CARE_PROVIDER_SITE_OTHER): Payer: Medicare FFS | Admitting: Internal Medicine

## 2014-12-19 ENCOUNTER — Encounter (INDEPENDENT_AMBULATORY_CARE_PROVIDER_SITE_OTHER): Payer: Self-pay | Admitting: Internal Medicine

## 2014-12-19 ENCOUNTER — Ambulatory Visit (INDEPENDENT_AMBULATORY_CARE_PROVIDER_SITE_OTHER): Payer: Medicare PPO | Admitting: Internal Medicine

## 2014-12-19 VITALS — BP 126/82 | HR 74 | Temp 98.4°F | Resp 18 | Ht 63.0 in | Wt 176.5 lb

## 2014-12-19 DIAGNOSIS — R5383 Other fatigue: Secondary | ICD-10-CM

## 2014-12-19 DIAGNOSIS — K5190 Ulcerative colitis, unspecified, without complications: Secondary | ICD-10-CM | POA: Diagnosis not present

## 2014-12-19 LAB — COMPREHENSIVE METABOLIC PANEL
ALT: 15 U/L (ref 0–35)
AST: 16 U/L (ref 0–37)
Albumin: 4.2 g/dL (ref 3.5–5.2)
Alkaline Phosphatase: 22 U/L — ABNORMAL LOW (ref 39–117)
BUN: 15 mg/dL (ref 6–23)
CO2: 23 mEq/L (ref 19–32)
Calcium: 8.6 mg/dL (ref 8.4–10.5)
Chloride: 104 mEq/L (ref 96–112)
Creat: 0.81 mg/dL (ref 0.50–1.10)
Glucose, Bld: 87 mg/dL (ref 70–99)
Potassium: 4 mEq/L (ref 3.5–5.3)
Sodium: 139 mEq/L (ref 135–145)
Total Bilirubin: 0.5 mg/dL (ref 0.2–1.2)
Total Protein: 7.4 g/dL (ref 6.0–8.3)

## 2014-12-19 LAB — CBC
HCT: 44 % (ref 36.0–46.0)
Hemoglobin: 14.8 g/dL (ref 12.0–15.0)
MCH: 31.2 pg (ref 26.0–34.0)
MCHC: 33.6 g/dL (ref 30.0–36.0)
MCV: 92.6 fL (ref 78.0–100.0)
MPV: 11 fL (ref 8.6–12.4)
Platelets: 205 10*3/uL (ref 150–400)
RBC: 4.75 MIL/uL (ref 3.87–5.11)
RDW: 13.4 % (ref 11.5–15.5)
WBC: 5.6 10*3/uL (ref 4.0–10.5)

## 2014-12-19 LAB — TSH: TSH: 1.251 u[IU]/mL (ref 0.350–4.500)

## 2014-12-19 NOTE — Patient Instructions (Signed)
Physician will call with results of blood work and further recommendations from Dr. Markus Daft regarding IVC filter

## 2014-12-19 NOTE — Progress Notes (Signed)
Presenting complaint;  Follow-up for ulcerative colitis. Patient complains of feeling tired.  Subjective:  Patient is 79 year old Caucasian female with history of ulcerative colitis here for scheduled visit. She was last seen 06/20/2014. She has been Humira since August 2015. She has no side effects other than feeling tired. She feels her fatigue has gotten worse lately but she has not been exercising like she used to. She has good appetite. Bowels move daily. She has formed stool and has not seen any blood in the long time. She takes furosemide occasionally.   Current Medications: Current Outpatient Prescriptions  Medication Sig Dispense Refill  . ASACOL HD 800 MG TBEC TAKE TWO TABLETS BY MOUTH TWICE DAILY 120 each 11  . aspirin 81 MG tablet Take 81 mg by mouth daily. Tues, Thurs, Sat    . denosumab (PROLIA) 60 MG/ML SOLN injection Inject 60 mg into the skin every 6 (six) months. Administer in upper arm, thigh, or abdomen    . furosemide (LASIX) 20 MG tablet 20 mg daily as needed.     Marland Kitchen HUMIRA PEN-CROHNS STARTER 40 MG/0.8ML PNKT Patient is to inject Humira 80 mg SQ Days 1 and 2 Patient is to inject Humira 40 mg SQ Days 14 and 15 Then the patient is to inject 40 mg SQ every 2 weeks    . loperamide (IMODIUM) 2 MG capsule Take by mouth as needed for diarrhea or loose stools.    Vladimir Faster Glycol-Propyl Glycol (SYSTANE OP) Apply 1 drop to eye at bedtime as needed. Dry eyes.    . Probiotic Product (ALIGN PO) Take by mouth daily.     No current facility-administered medications for this visit.     Objective: Blood pressure 126/82, pulse 74, temperature 98.4 F (36.9 C), temperature source Oral, resp. rate 18, height 5' 3"  (1.6 m), weight 176 lb 8 oz (80.06 kg). Patient is alert and in no acute distress. Conjunctiva is pink. Sclera is nonicteric Oropharyngeal mucosa is normal. No neck masses or thyromegaly noted. Cardiac exam with regular rhythm normal S1 and S2. No murmur or gallop  noted. Lungs are clear to auscultation. Abdomen is symmetrical soft and nontender without organomegaly or masses. No LE edema or clubbing noted.   Assessment:  #1. Chronic ulcerative colitis. She appears to be in remission. She is presently on oral mesalamine and subcutaneous Humira or adalimumab. #2. Fatigue. Nonspecific symptom and it may be due to one of her medications. Will make sure she does not have anemia or hypothyroidism. #3. Status post IVC filter placement in November 2013.   Plan:  Continue current therapy which includes Asacol HD 1600 mg by mouth twice a day and Humira 40 mg subcutaneous every 14 days. Patient will go to the lab for CBC with differential comprehensive chemistry panel and TSH. Patient encouraged to increase physical activity and exercise 3-4 times a week. Will check with Dr. Milinda Hirschfeld if he has any recommendations for her regarding IVC filter which was placed in November 2013. Office visit in 6 months.

## 2014-12-26 ENCOUNTER — Other Ambulatory Visit (INDEPENDENT_AMBULATORY_CARE_PROVIDER_SITE_OTHER): Payer: Self-pay | Admitting: Internal Medicine

## 2014-12-26 DIAGNOSIS — Z95828 Presence of other vascular implants and grafts: Secondary | ICD-10-CM

## 2015-01-02 ENCOUNTER — Ambulatory Visit
Admission: RE | Admit: 2015-01-02 | Discharge: 2015-01-02 | Disposition: A | Discharge status: Home / Self Care | Payer: Medicare FFS | Source: Ambulatory Visit | Attending: Internal Medicine | Admitting: Internal Medicine

## 2015-01-02 DIAGNOSIS — Z95828 Presence of other vascular implants and grafts: Secondary | ICD-10-CM | POA: Insufficient documentation

## 2015-01-02 NOTE — Consult Note (Signed)
Chief Complaint: Discuss IVC filter retrieval.  Referring Physician(s): Rehman,Najeeb U  History of Present Illness: Monique Day is a 79 y.o. female with history of ulcerative colitis and lower extremity DVTs. In November 2013, the patient presented with lower GI bleeding related to her ulcerative colitis and chronic anticoagulation therapy. Due to the GI bleeding, a retrievable Bard Denali IVC filter was placed on 07/13/2012. The patient has seen advertisments on television about complications associated with IVC filters and wanted to discuss whether or not she needs a filter retrieval. Overall, the patient is feeling very well and her ulcerative colitis is well controlled at this time. No new episodes of lower extremity DVT and no lower extremity swelling since placement of the filter. The patient is no longer on anticoagulation.   Past Medical History  Diagnosis Date  . UC (ulcerative colitis)   . Lower abdominal pain   . Occult blood in stools   . Hemorrhoids, external   . HX: breast cancer   . DVT (deep venous thrombosis)   . Cancer 2009    right breast cancer; mastectomy  . Cancer 1998    left; lumpectomy  . Seizures     one very slight seizure with the stroke; somewhere between 2005-2009  . Stroke 2005?    "very light;" also had one small seizure with stroke     Past Surgical History  Procedure Laterality Date  . Colonoscopy  02/21/2011  . Colonoscopy  10/06/2008  . Colonoscopy  12/27/01  . Colonoscopy  05/09/05  . Bone density  12/11/10  . Mastectomy Right 2009  . Breast lumpectomy Left 1998    Allergies: Mercaptopurine and Sulfa antibiotics  Medications: Prior to Admission medications   Medication Sig Start Date End Date Taking? Authorizing Provider  ASACOL HD 800 MG TBEC TAKE TWO TABLETS BY MOUTH TWICE DAILY 06/08/14  Yes [name], MD  aspirin 81 MG tablet Take 81 mg by mouth daily. Cain Saupe, Sat 07/26/12  Yes [name], MD    denosumab (PROLIA) 60 MG/ML SOLN injection Inject 60 mg into the skin every 6 (six) months. Administer in upper arm, thigh, or abdomen   Yes Historical Provider, MD  furosemide (LASIX) 20 MG tablet 20 mg daily as needed.  09/03/12  Yes Historical Provider, MD  HUMIRA PEN-CROHNS STARTER 40 MG/0.8ML PNKT Patient is to inject Humira 80 mg SQ Days 1 and 2 Patient is to inject Humira 40 mg SQ Days 14 and 15 Then the patient is to inject 40 mg SQ every 2 weeks 04/03/14  Yes Historical Provider, MD  loperamide (IMODIUM) 2 MG capsule Take by mouth as needed for diarrhea or loose stools.   Yes Historical Provider, MD  Polyethyl Glycol-Propyl Glycol (SYSTANE OP) Apply 1 drop to eye at bedtime as needed. Dry eyes.   Yes Historical Provider, MD  Probiotic Product (ALIGN PO) Take by mouth daily.   Yes Historical Provider, MD     Family History  Problem Relation Age of Onset  . Prostate cancer Father     History   Social History  . Marital Status: Widowed    Spouse Name: N/A  . Number of Children: N/A  . Years of Education: N/A   Social History Main Topics  . Smoking status: Former Smoker    Types: Cigarettes    Quit date: 05/05/2001  . Smokeless tobacco: Never Used  . Alcohol Use: No  . Drug Use: No  . Sexual Activity: Yes  Other Topics Concern  . Not on file   Social History Narrative     Review of Systems  Constitutional: Negative.   Respiratory: Negative.   Cardiovascular: Negative.  Negative for leg swelling.    Vital Signs: BP 150/94 mmHg  Pulse 70  Temp(Src) 97.9 F (36.6 C)  Resp 18  SpO2 98%  Physical Exam  Musculoskeletal: She exhibits no edema or tenderness.       Imaging: No results found.  Labs:  CBC:  Recent Labs  01/03/14 0958 02/07/14 1429 06/19/14 1035 12/19/14 1003  WBC 5.2 6.3 6.9 5.6  HGB 14.5 14.1 14.5 14.8  HCT 41.7 41.9 43.2 44.0  PLT 204 231 257 205    COAGS: No results for input(s): INR, APTT in the last 8760  hours.  BMP:  Recent Labs  12/19/14 1003  NA 139  K 4.0  CL 104  CO2 23  GLUCOSE 87  BUN 15  CALCIUM 8.6  CREATININE 0.81    LIVER FUNCTION TESTS:  Recent Labs  12/19/14 1003  BILITOT 0.5  AST 16  ALT 15  ALKPHOS 22*  PROT 7.4  ALBUMIN 4.2    Assessment and Plan:  79 year old female with ulcerative colitis and placement of a retrieval filter in 2013 due to GI bleeding and history of recurrent lower extremity DVTs. The patient has had no problems or symptoms related to the IVC filter. We discussed the IVC filter placement and retrieval in depth. I reviewed the patient's placement images with the patient and her friend.  I explained the retrieval procedure and possible complications which include vascular injury and unsuccessful retrieval. I explained the possible long term complications of an IVC filter which include IVC thrombosis, recurrent DVTs, filter migration and filter leg fracture. Patient does not want to go back on anticoagulation therapy and likes the idea of having the IVC filter for pulmonary embolism prophylaxis. At this time, the patient is comfortable with keeping the IVC filter and not restarting anticoagulation therapy.  Although the filter has been in for longer than 2 years there is no time-limit for this filter to be removed. I feel the filter could be successfully and safely removed if the patient changes her mind. At this time, we will not pursue IVC filter retrieval. The patient will follow-up as needed.  Thank you for this interesting consult.  I greatly enjoyed meeting Monique Day and look forward to participating in their care.  SignedCarylon Perches 01/02/2015, 1:42 PM   I spent a total of  15 Minutes in face to face in clinical consultation, greater than 50% of which was counseling/coordinating care for IVC filter.

## 2015-05-24 ENCOUNTER — Other Ambulatory Visit (INDEPENDENT_AMBULATORY_CARE_PROVIDER_SITE_OTHER): Payer: Self-pay | Admitting: Internal Medicine

## 2015-06-26 ENCOUNTER — Encounter (INDEPENDENT_AMBULATORY_CARE_PROVIDER_SITE_OTHER): Payer: Self-pay | Admitting: Internal Medicine

## 2015-06-26 ENCOUNTER — Ambulatory Visit (INDEPENDENT_AMBULATORY_CARE_PROVIDER_SITE_OTHER): Payer: Medicare PPO | Admitting: Internal Medicine

## 2015-06-26 VITALS — BP 128/74 | HR 66 | Temp 98.0°F | Resp 18 | Ht 63.0 in | Wt 168.1 lb

## 2015-06-26 DIAGNOSIS — K5190 Ulcerative colitis, unspecified, without complications: Secondary | ICD-10-CM

## 2015-06-26 NOTE — Progress Notes (Signed)
Presenting complaint;  Follow-up for ulcerative colitis.  Subjective:  Monique Day 79 year old Caucasian female was over 9 year history of ulcerative colitis is here for scheduled visit. She was last seen in May this year. She continues to feel well. She has formed stool daily. She has not taken furosemide or Lasix in several months. She did get flu shot yesterday. She works out at least 3-4 times a week. She has lost 8 pounds since her last visit and she is quite pleased. She is not having any side effects with Humira.      Current Medications: Outpatient Encounter Prescriptions as of 06/26/2015  Medication Sig  . ASACOL HD 800 MG TBEC TAKE TWO TABLETS BY MOUTH TWICE DAILY  . aspirin 81 MG tablet Take 81 mg by mouth daily. Tues, Thurs, Sat  . denosumab (PROLIA) 60 MG/ML SOLN injection Inject 60 mg into the skin every 6 (six) months. Administer in upper arm, thigh, or abdomen  . furosemide (LASIX) 20 MG tablet 20 mg daily as needed.   Marland Kitchen HUMIRA PEN 40 MG/0.8ML PNKT INJECT 40MG SQ EVERY 2 WEEKS  . loperamide (IMODIUM) 2 MG capsule Take by mouth as needed for diarrhea or loose stools.  Vladimir Faster Glycol-Propyl Glycol (SYSTANE OP) Apply 1 drop to eye at bedtime as needed. Dry eyes.  . Probiotic Product (ALIGN PO) Take by mouth daily.   No facility-administered encounter medications on file as of 06/26/2015.     Objective: Blood pressure 128/74, pulse 66, temperature 98 F (36.7 C), temperature source Oral, resp. rate 18, height 5' 3"  (1.6 m), weight 168 lb 1.6 oz (76.25 kg). Patient is alert and in no acute distress. Conjunctiva is pink. Sclera is nonicteric Oropharyngeal mucosa is normal. No neck masses or thyromegaly noted. Cardiac exam with regular rhythm normal S1 and S2. No murmur or gallop noted. Lungs are clear to auscultation. Abdomen is full but soft and nontender without organomegaly or masses.  No LE edema or clubbing noted.  Labs/studies Results: She had normal CBC,  comprehensive chemistry panel and TSH and 12/19/2014.   Assessment:  #1. Ulcerative colitis remains in remission. She is not having any side effects with Humira. She will continue Humira and oral mesalamine   Plan:  Imodium and furosemide removed from the list for medications since she has not used these in a long time. Office visit in 6 months. Surveillance colonoscopy following next visit.

## 2015-06-26 NOTE — Patient Instructions (Signed)
Notify if you have diarrhea or rectal bleeding.

## 2015-07-23 ENCOUNTER — Other Ambulatory Visit (INDEPENDENT_AMBULATORY_CARE_PROVIDER_SITE_OTHER): Payer: Self-pay | Admitting: Internal Medicine

## 2015-08-14 ENCOUNTER — Encounter (INDEPENDENT_AMBULATORY_CARE_PROVIDER_SITE_OTHER): Payer: Self-pay | Admitting: Internal Medicine

## 2015-08-15 ENCOUNTER — Encounter (INDEPENDENT_AMBULATORY_CARE_PROVIDER_SITE_OTHER): Payer: Self-pay | Admitting: Internal Medicine

## 2015-11-13 ENCOUNTER — Encounter (INDEPENDENT_AMBULATORY_CARE_PROVIDER_SITE_OTHER): Payer: Self-pay | Admitting: Internal Medicine

## 2015-11-16 ENCOUNTER — Encounter (INDEPENDENT_AMBULATORY_CARE_PROVIDER_SITE_OTHER): Payer: Self-pay | Admitting: *Deleted

## 2015-12-13 ENCOUNTER — Other Ambulatory Visit (INDEPENDENT_AMBULATORY_CARE_PROVIDER_SITE_OTHER): Payer: Self-pay | Admitting: Internal Medicine

## 2016-01-01 ENCOUNTER — Ambulatory Visit (INDEPENDENT_AMBULATORY_CARE_PROVIDER_SITE_OTHER): Payer: Medicare Other | Admitting: Internal Medicine

## 2016-01-01 ENCOUNTER — Encounter (INDEPENDENT_AMBULATORY_CARE_PROVIDER_SITE_OTHER): Payer: Self-pay | Admitting: Internal Medicine

## 2016-01-01 VITALS — BP 128/84 | HR 66 | Temp 98.2°F | Resp 18 | Ht 63.0 in | Wt 166.6 lb

## 2016-01-01 DIAGNOSIS — K5190 Ulcerative colitis, unspecified, without complications: Secondary | ICD-10-CM

## 2016-01-01 LAB — CBC WITH DIFFERENTIAL/PLATELET
Basophils Absolute: 63 cells/uL (ref 0–200)
Basophils Relative: 1 %
Eosinophils Absolute: 252 cells/uL (ref 15–500)
Eosinophils Relative: 4 %
HCT: 43.7 % (ref 35.0–45.0)
Hemoglobin: 14.4 g/dL (ref 11.7–15.5)
Lymphocytes Relative: 51 %
Lymphs Abs: 3213 cells/uL (ref 850–3900)
MCH: 30.8 pg (ref 27.0–33.0)
MCHC: 33 g/dL (ref 32.0–36.0)
MCV: 93.4 fL (ref 80.0–100.0)
MPV: 11.5 fL (ref 7.5–12.5)
Monocytes Absolute: 441 cells/uL (ref 200–950)
Monocytes Relative: 7 %
Neutro Abs: 2331 cells/uL (ref 1500–7800)
Neutrophils Relative %: 37 %
Platelets: 211 10*3/uL (ref 140–400)
RBC: 4.68 MIL/uL (ref 3.80–5.10)
RDW: 13.9 % (ref 11.0–15.0)
WBC: 6.3 10*3/uL (ref 3.8–10.8)

## 2016-01-01 LAB — COMPREHENSIVE METABOLIC PANEL
ALT: 14 U/L (ref 6–29)
AST: 17 U/L (ref 10–35)
Albumin: 4.3 g/dL (ref 3.6–5.1)
Alkaline Phosphatase: 26 U/L — ABNORMAL LOW (ref 33–130)
BUN: 19 mg/dL (ref 7–25)
CO2: 27 mmol/L (ref 20–31)
Calcium: 9.4 mg/dL (ref 8.6–10.4)
Chloride: 104 mmol/L (ref 98–110)
Creat: 0.92 mg/dL (ref 0.60–0.93)
Glucose, Bld: 78 mg/dL (ref 65–99)
Potassium: 4.9 mmol/L (ref 3.5–5.3)
Sodium: 140 mmol/L (ref 135–146)
Total Bilirubin: 0.4 mg/dL (ref 0.2–1.2)
Total Protein: 7.2 g/dL (ref 6.1–8.1)

## 2016-01-01 NOTE — Patient Instructions (Signed)
Physician will call with results of blood tests when completed. Will plan colonoscopy later or early next year(surveillance colonoscopy)

## 2016-01-01 NOTE — Progress Notes (Signed)
Presenting complaint;  Follow-up for ulcerative colitis.  Subjective:  Monique Day is 80 year old Caucasian female who has chronic ulcerative colitis and is here for scheduled visit. She was last seen on 06/26/2015. She feels fine. She is on Humira 40 mg every 2 weeks. Her co-pay now is $1.95 per dose. Last dose was yesterday. She is not having any side effects. He is having 1 formed stool daily. She denies melena or rectal bleeding. She has good appetite. She had left cataract extraction 3 weeks ago with good results. She is planning have the right side done next week. She has not had any blood work since May last year. Last year she did see Dr. Markus Daft and decided to leave her IVC filter unknown. She is not having any problems.   Current Medications: Outpatient Encounter Prescriptions as of 01/01/2016  Medication Sig  . ASACOL HD 800 MG TBEC TAKE TWO TABLETS BY MOUTH TWICE DAILY  . aspirin 81 MG tablet Take 81 mg by mouth daily. Tues, Thurs, Sat  . denosumab (PROLIA) 60 MG/ML SOLN injection Inject 60 mg into the skin every 6 (six) months. Administer in upper arm, thigh, or abdomen  . HUMIRA PEN 40 MG/0.8ML PNKT INJECT 40MG SQ EVERY 2 WEEKS  . ketorolac (ACULAR) 0.5 % ophthalmic solution Place 1 drop into the left eye 4 (four) times daily.   . Magnesium 500 MG TABS Take 500 mg by mouth daily.  Vladimir Faster Glycol-Propyl Glycol (SYSTANE OP) Apply 1 drop to eye at bedtime as needed. Dry eyes.  . prednisoLONE acetate (PRED FORTE) 1 % ophthalmic suspension Place 1 drop into the left eye 2 (two) times daily.   . Probiotic Product (ALIGN PO) Take by mouth daily.   No facility-administered encounter medications on file as of 01/01/2016.     Objective: Blood pressure 128/84, pulse 66, temperature 98.2 F (36.8 C), temperature source Oral, resp. rate 18, height 5' 3"  (1.6 m), weight 166 lb 9.6 oz (75.569 kg). Patient is alert and in no acute distress. Conjunctiva is pink. Sclera is  nonicteric Oropharyngeal mucosa is normal. No neck masses or thyromegaly noted. Cardiac exam with regular rhythm normal S1 and S2. No murmur or gallop noted. Lungs are clear to auscultation. Abdomen is full but soft and nontender without organomegaly or masses. No LE edema or clubbing noted.   Assessment:  #1. Ulcerative colitis remains in remission. She is presently on Humira and oral mesalamine. Disease duration is over 55 years. Last colonoscopy was in July 2012.   Plan:  Patient will go the lab for CBC with differential and comprehensive chemistry panel. Continue Humira and Asacol HD at current dose. Surveillance colonoscopy either later this year her early next year. Office visit in 6 months.

## 2016-02-27 ENCOUNTER — Encounter (INDEPENDENT_AMBULATORY_CARE_PROVIDER_SITE_OTHER): Payer: Self-pay | Admitting: *Deleted

## 2016-03-12 ENCOUNTER — Other Ambulatory Visit (INDEPENDENT_AMBULATORY_CARE_PROVIDER_SITE_OTHER): Payer: Self-pay | Admitting: Internal Medicine

## 2016-03-14 ENCOUNTER — Other Ambulatory Visit (INDEPENDENT_AMBULATORY_CARE_PROVIDER_SITE_OTHER): Payer: Self-pay | Admitting: *Deleted

## 2016-03-14 DIAGNOSIS — Z86010 Personal history of colonic polyps: Secondary | ICD-10-CM | POA: Insufficient documentation

## 2016-05-22 ENCOUNTER — Telehealth (INDEPENDENT_AMBULATORY_CARE_PROVIDER_SITE_OTHER): Payer: Self-pay | Admitting: *Deleted

## 2016-05-22 ENCOUNTER — Encounter (INDEPENDENT_AMBULATORY_CARE_PROVIDER_SITE_OTHER): Payer: Self-pay | Admitting: *Deleted

## 2016-05-22 NOTE — Telephone Encounter (Signed)
Patient needs trilyte 

## 2016-05-23 MED ORDER — PEG 3350-KCL-NA BICARB-NACL 420 G PO SOLR: 4000.0000 mL | Freq: Once | ORAL | 0 refills | Status: AC

## 2016-06-10 ENCOUNTER — Other Ambulatory Visit (INDEPENDENT_AMBULATORY_CARE_PROVIDER_SITE_OTHER): Payer: Self-pay | Admitting: Internal Medicine

## 2016-06-10 ENCOUNTER — Telehealth (INDEPENDENT_AMBULATORY_CARE_PROVIDER_SITE_OTHER): Payer: Self-pay | Admitting: *Deleted

## 2016-06-10 NOTE — Telephone Encounter (Signed)
Referring MD/PCP: butler   Procedure: tcs  Reason/Indication:  Hx polyps  Has patient had this procedure before?  Yes, 2012  If so, when, by whom and where?    Is there a family history of colon cancer?  no  Who?  What age when diagnosed?    Is patient diabetic?   no      Does patient have prosthetic heart valve or mechanical valve?  no  Do you have a pacemaker?  no  Has patient ever had endocarditis? no  Has patient had joint replacement within last 12 months?  no  Does patient tend to be constipated or take laxatives? no  Does patient have a history of alcohol/drug use?  no  Is patient on Coumadin, Plavix and/or Aspirin? yes  Medications: asa 81 mg tues, thurs & sat, humira 40 mg 1 injection every 2 weeks, asachol 800 mg 2 tab bid, magnesium 500 mg daily, prolia injection 60 mg twice a year, align daily, sustaine eye drops prn  Allergies: mercaptopurine, sulfa  Medication Adjustment: asa 2 days  Procedure date & time: 07/02/16 at 1055

## 2016-06-10 NOTE — Telephone Encounter (Signed)
agree

## 2016-07-02 ENCOUNTER — Encounter (HOSPITAL_COMMUNITY)
Admission: RE | Disposition: A | Discharge status: Home / Self Care | Payer: Self-pay | Source: Ambulatory Visit | Attending: Internal Medicine

## 2016-07-02 ENCOUNTER — Ambulatory Visit (HOSPITAL_COMMUNITY)
Admission: RE | Admit: 2016-07-02 | Discharge: 2016-07-02 | Disposition: A | Discharge status: Home / Self Care | Payer: Medicare Other | Source: Ambulatory Visit | Attending: Internal Medicine | Admitting: Internal Medicine

## 2016-07-02 ENCOUNTER — Encounter (HOSPITAL_COMMUNITY): Payer: Self-pay | Admitting: *Deleted

## 2016-07-02 DIAGNOSIS — Z79899 Other long term (current) drug therapy: Secondary | ICD-10-CM | POA: Diagnosis not present

## 2016-07-02 DIAGNOSIS — Z86718 Personal history of other venous thrombosis and embolism: Secondary | ICD-10-CM | POA: Insufficient documentation

## 2016-07-02 DIAGNOSIS — Z9011 Acquired absence of right breast and nipple: Secondary | ICD-10-CM | POA: Diagnosis not present

## 2016-07-02 DIAGNOSIS — K635 Polyp of colon: Secondary | ICD-10-CM | POA: Insufficient documentation

## 2016-07-02 DIAGNOSIS — Z7982 Long term (current) use of aspirin: Secondary | ICD-10-CM | POA: Insufficient documentation

## 2016-07-02 DIAGNOSIS — Z09 Encounter for follow-up examination after completed treatment for conditions other than malignant neoplasm: Secondary | ICD-10-CM | POA: Diagnosis not present

## 2016-07-02 DIAGNOSIS — K644 Residual hemorrhoidal skin tags: Secondary | ICD-10-CM | POA: Insufficient documentation

## 2016-07-02 DIAGNOSIS — K6289 Other specified diseases of anus and rectum: Secondary | ICD-10-CM | POA: Diagnosis not present

## 2016-07-02 DIAGNOSIS — Z853 Personal history of malignant neoplasm of breast: Secondary | ICD-10-CM | POA: Insufficient documentation

## 2016-07-02 DIAGNOSIS — Z87891 Personal history of nicotine dependence: Secondary | ICD-10-CM | POA: Insufficient documentation

## 2016-07-02 DIAGNOSIS — Z860100 Personal history of colon polyps, unspecified: Secondary | ICD-10-CM | POA: Insufficient documentation

## 2016-07-02 DIAGNOSIS — Z8673 Personal history of transient ischemic attack (TIA), and cerebral infarction without residual deficits: Secondary | ICD-10-CM | POA: Diagnosis not present

## 2016-07-02 DIAGNOSIS — K5190 Ulcerative colitis, unspecified, without complications: Secondary | ICD-10-CM | POA: Diagnosis not present

## 2016-07-02 DIAGNOSIS — Z1211 Encounter for screening for malignant neoplasm of colon: Secondary | ICD-10-CM | POA: Insufficient documentation

## 2016-07-02 HISTORY — PX: COLONOSCOPY: SHX5424

## 2016-07-02 SURGERY — COLONOSCOPY
Anesthesia: Moderate Sedation

## 2016-07-02 MED ORDER — MEPERIDINE HCL 50 MG/ML IJ SOLN
INTRAMUSCULAR | Status: DC | PRN
  Administered 2016-07-02 (×2): 25 mg via INTRAVENOUS

## 2016-07-02 MED ORDER — MIDAZOLAM HCL 5 MG/5ML IJ SOLN
INTRAMUSCULAR | Status: AC
  Filled 2016-07-02: qty 10

## 2016-07-02 MED ORDER — SODIUM CHLORIDE 0.9 % IV SOLN
INTRAVENOUS | Status: DC
  Administered 2016-07-02: 10:00:00 via INTRAVENOUS

## 2016-07-02 MED ORDER — MEPERIDINE HCL 50 MG/ML IJ SOLN
INTRAMUSCULAR | Status: AC
  Filled 2016-07-02: qty 1

## 2016-07-02 MED ORDER — STERILE WATER FOR IRRIGATION IR SOLN
Status: DC | PRN
  Administered 2016-07-02: 11:00:00

## 2016-07-02 MED ORDER — MIDAZOLAM HCL 5 MG/5ML IJ SOLN
INTRAMUSCULAR | Status: DC | PRN
  Administered 2016-07-02 (×2): 1 mg via INTRAVENOUS
  Administered 2016-07-02: 2 mg via INTRAVENOUS
  Administered 2016-07-02: 1 mg via INTRAVENOUS

## 2016-07-02 NOTE — H&P (Signed)
Monique Day is an 80 y.o. female.   Chief Complaint: Patient is here for colonoscopy. HPI: Patient is 80 year old Caucasian female was 69 year history of ulcerative colitis was remained in remission on Humira. She is returning for surveillance colonoscopy. Last exam was in July 2012. She denies abdominal pain diarrhea or rectal bleeding. Family history is negative for CRC.  Past Medical History:  Diagnosis Date  . Cancer [facility]) 2009   right breast cancer; mastectomy  . Cancer [facility]) 1998   left; lumpectomy  . DVT (deep venous thrombosis) ([place])   . Hemorrhoids, external   . HX: breast cancer   . Lower abdominal pain   . Occult blood in stools   . Seizures ([place])    one very slight seizure with the stroke; somewhere between 2005-2009  . Stroke [facility]) 2005?   "very light;" also had one small seizure with stroke   . UC (ulcerative colitis) [facility])     Past Surgical History:  Procedure Laterality Date  . bone density  12/11/10  . BREAST LUMPECTOMY Left 1998  . COLONOSCOPY  02/21/2011  . COLONOSCOPY  10/06/2008  . COLONOSCOPY  12/27/01  . COLONOSCOPY  05/09/05  . EXPLORATORY LAPAROTOMY    . Left Eye     Caturact  . MASTECTOMY Right 2009    Family History  Problem Relation Age of Onset  . Prostate cancer Father   . Colon cancer Neg Hx    Social History:  reports that she quit smoking about 15 years ago. Her smoking use included Cigarettes. She has never used smokeless tobacco. She reports that she does not drink alcohol or use drugs.  Allergies:  Allergies  Allergen Reactions  . Mercaptopurine Other (See Comments)    Dropped WBC  . Sulfa Antibiotics Other (See Comments)    Extreme Weakness    Medications Prior to Admission  Medication Sig Dispense Refill  . ASACOL HD 800 MG TBEC TAKE TWO TABLETS BY MOUTH TWICE DAILY 120 each 11  . aspirin 81 MG tablet Take 81 mg by mouth daily. Tues, Thurs, Sat    . HUMIRA PEN 40 MG/0.8ML PNKT INJECT 40MG SQ EVERY 2 WEEKS 2 each 11   . Magnesium 500 MG TABS Take 500 mg by mouth daily.    . Probiotic Product (ALIGN PO) Take 1 capsule by mouth daily.     Marland Kitchen denosumab (PROLIA) 60 MG/ML SOLN injection Inject 60 mg into the skin every 6 (six) months. Administer in upper arm, thigh, or abdomen    . Polyethyl Glycol-Propyl Glycol (SYSTANE OP) Apply 1 drop to eye at bedtime as needed. Dry eyes.      No results found for this or any previous visit (from the past 48 hour(s)). No results found.  ROS  Blood pressure (!) 170/79, pulse 71, temperature 98.3 F (36.8 C), temperature source Oral, resp. rate 14, height 5' 3"  (1.6 m), weight 168 lb (76.2 kg), SpO2 100 %. Physical Exam  Constitutional: She appears well-developed and well-nourished.  HENT:  Mouth/Throat: Oropharynx is clear and moist.  Eyes: Conjunctivae are normal. No scleral icterus.  Neck: No thyromegaly present.  Cardiovascular: Normal rate, regular rhythm and normal heart sounds.   No murmur heard. Respiratory: Effort normal and breath sounds normal.  GI: Soft. She exhibits no distension and no mass. There is no tenderness.  Musculoskeletal: She exhibits no edema.  Lymphadenopathy:    She has no cervical adenopathy.  Neurological: She is alert.  Skin: Skin is warm and  dry.     Assessment/Plan Chronic ulcerative colitis. Surveillance colonoscopy.  Hildred Laser, MD 07/02/2016, 11:03 AM

## 2016-07-02 NOTE — Op Note (Signed)
[facility] Patient Name: Monique Day Procedure Date: 07/02/2016 10:47 AM MRN: [id number] Date of Birth: [birth year] Attending MD: Hildred Laser , MD CSN: [id number] Age: 80 Admit Type: Outpatient Procedure:                Colonoscopy Indications:              High risk colon cancer surveillance: Ulcerative                            pancolitis of 8 (or more) years duration Providers:                Hildred Laser, MD, Otis Peak B. Sharon Seller, RN, Charlyne Petrin RN, RN, Randa Spike, Technician Referring MD:             Octavio Graves, Do Medicines:                Meperidine 50 mg IV, Midazolam 5 mg IV Complications:            No immediate complications. Estimated Blood Loss:     Estimated blood loss was minimal. Procedure:                Pre-Anesthesia Assessment:                           - Prior to the procedure, a History and Physical                            was performed, and patient medications and                            allergies were reviewed. The patient's tolerance of                            previous anesthesia was also reviewed. The risks                            and benefits of the procedure and the sedation                            options and risks were discussed with the patient.                            All questions were answered, and informed consent                            was obtained. Prior Anticoagulants: The patient                            last took aspirin 4 days prior to the procedure.                            ASA Grade Assessment: II - A patient with mild  systemic disease. After reviewing the risks and                            benefits, the patient was deemed in satisfactory                            condition to undergo the procedure.                           After obtaining informed consent, the colonoscope                            was passed under direct vision.  Throughout the                            procedure, the patient's blood pressure, pulse, and                            oxygen saturations were monitored continuously. The                            EC-[id number] ([id number]) scope was introduced through                            the anus and advanced to the the cecum, identified                            by appendiceal orifice and ileocecal valve. The                            patient tolerated the procedure well. The                            colonoscopy was somewhat difficult due to                            significant looping. Successful completion of the                            procedure was aided by changing the patient's                            position and applying abdominal pressure. The                            patient tolerated the procedure well. The quality                            of the bowel preparation was adequate. The                            ileocecal valve, appendiceal orifice, and rectum  were photographed. The quality of the bowel                            preparation was adequate. Scope In: 11:12:30 AM Scope Out: 11:47:16 AM Scope Withdrawal Time: 0 hours 12 minutes 35 seconds  Total Procedure Duration: 0 hours 34 minutes 46 seconds  Findings:      The perianal and digital rectal examinations were normal.      Four sessile polyps were found in the splenic flexure. The polyps were       small in size. These were biopsied with a cold forceps for histology.       The pathology specimen was placed into Bottle Number 1.      A healed ulcer was found in the rectum, in the sigmoid colon, in the       descending colon, at the splenic flexure, in the transverse colon, at       the hepatic flexure and in the ascending colon.      A localized area of congested and nodular mucosa was found in the       sigmoid colon. Biopsies were taken with a cold forceps for histology.       The  pathology specimen was placed into Bottle Number 2.      External hemorrhoids were found during retroflexion. The hemorrhoids       were small.      Anal papilla(e) were hypertrophied. Impression:               - Four small polyps at the splenic flexure.                            Biopsied.                           - Scar in the rectum, in the sigmoid colon, in the                            descending colon, at the splenic flexure, in the                            transverse colon, at the hepatic flexure and in the                            ascending colon due to healed colitis.                           - Congested and nodular mucosa in the sigmoid                            colon. Biopsied.                           - External hemorrhoids.                           - Anal papilla(e) were hypertrophied. Moderate Sedation:      Moderate (conscious) sedation was administered by the endoscopy nurse       and supervised by the  endoscopist. The following parameters were       monitored: oxygen saturation, heart rate, blood pressure, CO2       capnography and response to care. Total physician intraservice time was       45 minutes. Recommendation:           - Patient has a contact number available for                            emergencies. The signs and symptoms of potential                            delayed complications were discussed with the                            patient. Return to normal activities tomorrow.                            Written discharge instructions were provided to the                            patient.                           - Resume previous diet today.                           - Continue present medications.                           - Resume aspirin at prior dose tomorrow.                           - Await pathology results.                           - Repeat colonoscopy in 5 years for surveillance. Procedure Code(s):        --- Professional ---                            [phone], Colonoscopy, flexible; with biopsy, single                            or multiple                           99152, Moderate sedation services provided by the                            same physician or other qualified health care                            professional performing the diagnostic or                            therapeutic service that the sedation supports,  requiring the presence of an independent trained                            observer to assist in the monitoring of the                            patient's level of consciousness and physiological                            status; initial 15 minutes of intraservice time,                            patient age 64 years or older                           [phone], Moderate sedation services; each additional                            15 minutes intraservice time                           [phone], Moderate sedation services; each additional                            15 minutes intraservice time Diagnosis Code(s):        --- Professional ---                           K51.00, Ulcerative (chronic) pancolitis without                            complications                           D12.3, Benign neoplasm of transverse colon (hepatic                            flexure or splenic flexure)                           K62.89, Other specified diseases of anus and rectum                           K63.89, Other specified diseases of intestine                           K64.4, Residual hemorrhoidal skin tags CPT copyright 2016 [employer]. All rights reserved. The codes documented in this report are preliminary and upon coder review may  be revised to meet current compliance requirements. Hildred Laser, MD Hildred Laser, MD 07/02/2016 12:00:56 PM This report has been signed electronically. Number of Addenda: 0

## 2016-07-02 NOTE — Discharge Instructions (Signed)
Resume aspirin on 07/03/2016. Resume other medications and diet as before. No driving for 24 hours. Physician will call with biopsy results.   Colonoscopy, Adult, Care After This sheet gives you information about how to care for yourself after your procedure. Your doctor may also give you more specific instructions. If you have problems or questions, call your doctor. Follow these instructions at home: General instructions  For the first 24 hours after the procedure:  Do not drive or use machinery.  Do not sign important documents.  Do not drink alcohol.  Do your daily activities more slowly than normal.  Eat foods that are soft and easy to digest.  Rest often.  Take over-the-counter or prescription medicines only as told by your doctor.  It is up to you to get the results of your procedure. Ask your doctor, or the department performing the procedure, when your results will be ready. To help cramping and bloating:  Try walking around.  Put heat on your belly (abdomen) as told by your doctor. Use a heat source that your doctor recommends, such as a moist heat pack or a heating pad.  Put a towel between your skin and the heat source.  Leave the heat on for 20-30 minutes.  Remove the heat if your skin turns bright red. This is especially important if you cannot feel pain, heat, or cold. You can get burned. Eating and drinking  Drink enough fluid to keep your pee (urine) clear or pale yellow.  Return to your normal diet as told by your doctor. Avoid heavy or fried foods that are hard to digest.  Avoid drinking alcohol for as long as told by your doctor. Contact a doctor if:  You have blood in your poop (stool) 2-3 days after the procedure. Get help right away if:  You have more than a small amount of blood in your poop.  You see large clumps of tissue (blood clots) in your poop.  Your belly is swollen.  You feel sick to your stomach (nauseous).  You throw up  (vomit).  You have a fever.  You have belly pain that gets worse, and medicine does not help your pain. This information is not intended to replace advice given to you by your health care provider. Make sure you discuss any questions you have with your health care provider. Document Released: 09/06/2010 Document Revised: 04/28/2016 Document Reviewed: 04/28/2016 Elsevier Interactive Patient Education  2017 [place].    Colon Polyps Introduction Polyps are tissue growths inside the body. Polyps can grow in many places, including the large intestine (colon). A polyp may be a round bump or a mushroom-shaped growth. You could have one polyp or several. Most colon polyps are noncancerous (benign). However, some colon polyps can become cancerous over time. What are the causes? The exact cause of colon polyps is not known. What increases the risk? This condition is more likely to develop in people who:  Have a family history of colon cancer or colon polyps.  Are older than 23 or older than 45 if they are African American.  Have inflammatory bowel disease, such as ulcerative colitis or Crohn disease.  Are overweight.  Smoke cigarettes.  Do not get enough exercise.  Drink too much alcohol.  Eat a diet that is:  High in fat and red meat.  Low in fiber.  Had childhood cancer that was treated with abdominal radiation. What are the signs or symptoms? Most polyps do not cause symptoms. If you  have symptoms, they may include:  Blood coming from your rectum when having a bowel movement.  Blood in your stool.The stool may look dark red or black.  A change in bowel habits, such as constipation or diarrhea. How is this diagnosed? This condition is diagnosed with a colonoscopy. This is a procedure that uses a lighted, flexible scope to look at the inside of your colon. How is this treated? Treatment for this condition involves removing any polyps that are found. Those polyps will  then be tested for cancer. If cancer is found, your health care provider will talk to you about options for colon cancer treatment. Follow these instructions at home: Diet  Eat plenty of fiber, such as fruits, vegetables, and whole grains.  Eat foods that are high in calcium and vitamin D, such as milk, cheese, yogurt, eggs, liver, fish, and broccoli.  Limit foods high in fat, red meats, and processed meats, such as hot dogs, sausage, bacon, and lunch meats.  Maintain a healthy weight, or lose weight if recommended by your health care provider. General instructions  Do not smoke cigarettes.  Do not drink alcohol excessively.  Keep all follow-up visits as told by your health care provider. This is important. This includes keeping regularly scheduled colonoscopies. Talk to your health care provider about when you need a colonoscopy.  Exercise every day or as told by your health care provider. Contact a health care provider if:  You have new or worsening bleeding during a bowel movement.  You have new or increased blood in your stool.  You have a change in bowel habits.  You unexpectedly lose weight. This information is not intended to replace advice given to you by your health care provider. Make sure you discuss any questions you have with your health care provider. Document Released: 04/30/2004 Document Revised: 01/10/2016 Document Reviewed: 06/25/2015  2017 Elsevier

## 2016-07-04 ENCOUNTER — Encounter (HOSPITAL_COMMUNITY): Payer: Self-pay | Admitting: Internal Medicine

## 2016-07-05 ENCOUNTER — Other Ambulatory Visit (INDEPENDENT_AMBULATORY_CARE_PROVIDER_SITE_OTHER): Payer: Self-pay | Admitting: Internal Medicine

## 2016-07-05 MED ORDER — ADALIMUMAB 40 MG/0.8ML ~~LOC~~ AJKT: 40.0000 mg | AUTO-INJECTOR | SUBCUTANEOUS | 11 refills | Status: DC

## 2016-07-15 ENCOUNTER — Ambulatory Visit (INDEPENDENT_AMBULATORY_CARE_PROVIDER_SITE_OTHER): Payer: Medicare Other | Admitting: Internal Medicine

## 2016-07-16 ENCOUNTER — Encounter (INDEPENDENT_AMBULATORY_CARE_PROVIDER_SITE_OTHER): Payer: Self-pay | Admitting: *Deleted

## 2016-09-09 ENCOUNTER — Telehealth (INDEPENDENT_AMBULATORY_CARE_PROVIDER_SITE_OTHER): Payer: Self-pay | Admitting: *Deleted

## 2016-09-09 NOTE — Telephone Encounter (Signed)
Patient needs a 6 month OV with him from now.

## 2016-09-10 NOTE — Telephone Encounter (Signed)
Patient was called message and advised that receptionist would be calling her with appointment for 6 months.

## 2016-09-17 ENCOUNTER — Encounter (INDEPENDENT_AMBULATORY_CARE_PROVIDER_SITE_OTHER): Payer: Self-pay | Admitting: Internal Medicine

## 2016-09-17 NOTE — Telephone Encounter (Signed)
Patient was given an appointment for 03/10/17 at 11:00am with Dr. Laural Golden.  A letter was mailed to the patient.

## 2017-03-10 ENCOUNTER — Encounter (INDEPENDENT_AMBULATORY_CARE_PROVIDER_SITE_OTHER): Payer: Self-pay | Admitting: Internal Medicine

## 2017-03-10 ENCOUNTER — Ambulatory Visit (INDEPENDENT_AMBULATORY_CARE_PROVIDER_SITE_OTHER): Payer: Medicare Other | Admitting: Internal Medicine

## 2017-03-10 ENCOUNTER — Other Ambulatory Visit (INDEPENDENT_AMBULATORY_CARE_PROVIDER_SITE_OTHER): Payer: Self-pay | Admitting: *Deleted

## 2017-03-10 VITALS — BP 118/78 | HR 72 | Temp 98.4°F | Resp 18 | Ht 63.0 in | Wt 172.8 lb

## 2017-03-10 DIAGNOSIS — Z1329 Encounter for screening for other suspected endocrine disorder: Secondary | ICD-10-CM | POA: Diagnosis not present

## 2017-03-10 DIAGNOSIS — K5100 Ulcerative (chronic) pancolitis without complications: Secondary | ICD-10-CM | POA: Diagnosis not present

## 2017-03-10 NOTE — Progress Notes (Signed)
Presenting complaint;  Follow-up for ulcerative colitis.  Subjective:  Patient is 81 year old Caucasian female who was over 50 year history of ulcerative colitis who is here for scheduled visit. While she was last seen in the office in May 2017 she did undergo surveillance colonoscopy in November 2017. She did not have active colitis or adenomas. She says she is doing very well. On most days she has one formed stool. Every now and then she may have a second stool. She denies abdominal cramps melena or rectal bleeding. She is not having any side effects with Humira. While she is not going to gym anymore but she states busy. She works as a Psychologist, occupational at [employer]. She works 4 days a week and at least 5 hours each time. She has gained 6 pounds since her last visit. She has not had any blood work in over a year.  Current Medications: Outpatient Encounter Prescriptions as of 03/10/2017  Medication Sig  . Adalimumab (HUMIRA PEN) 40 MG/0.8ML PNKT Inject 40 mg into the skin every 14 (fourteen) days.  . ASACOL HD 800 MG TBEC TAKE TWO TABLETS BY MOUTH TWICE DAILY  . aspirin 81 MG tablet Take 1 tablet (81 mg total) by mouth daily. Tues, Thurs, Sat  . denosumab (PROLIA) 60 MG/ML SOLN injection Inject 60 mg into the skin every 6 (six) months. Administer in upper arm, thigh, or abdomen  . Magnesium 500 MG TABS Take 500 mg by mouth daily.  Vladimir Faster Glycol-Propyl Glycol (SYSTANE OP) Apply 1 drop to eye at bedtime as needed. Dry eyes.  . Probiotic Product (ALIGN PO) Take 1 capsule by mouth daily.    No facility-administered encounter medications on file as of 03/10/2017.      Objective: Blood pressure 118/78, pulse 72, temperature 98.4 F (36.9 C), temperature source Oral, resp. rate 18, height 5' 3"  (1.6 m), weight 172 lb 12.8 oz (78.4 kg). Patient is alert and in no acute distress. Conjunctiva is pink. Sclera is nonicteric Oropharyngeal mucosa is normal. No neck masses or thyromegaly  noted. Cardiac exam with regular rhythm normal S1 and S2. No murmur or gallop noted. Lungs are clear to auscultation. Abdomen is full but soft and nontender without organomegaly or masses. No LE edema or clubbing noted.   Assessment:  #1. Chronic ulcerative colitis. She remains in remission. She is not having any side effects with Humira and oral mesalamine.  #2. She is due for thyroid screening.   Plan:  Patient will go to the lab for CBC, comprehensive chemistry panel and TSH. Office visit in 6 months.

## 2017-03-10 NOTE — Patient Instructions (Signed)
Physician will call with results of blood tests when completed.

## 2017-03-11 LAB — COMPREHENSIVE METABOLIC PANEL
ALT: 12 IU/L (ref 0–32)
AST: 18 IU/L (ref 0–40)
Albumin/Globulin Ratio: 1.3 (ref 1.2–2.2)
Albumin: 4 g/dL (ref 3.5–4.8)
Alkaline Phosphatase: 34 IU/L — ABNORMAL LOW (ref 39–117)
BUN/Creatinine Ratio: 22 (ref 12–28)
BUN: 19 mg/dL (ref 8–27)
Bilirubin Total: 0.3 mg/dL (ref 0.0–1.2)
CO2: 23 mmol/L (ref 20–29)
Calcium: 9.1 mg/dL (ref 8.7–10.3)
Chloride: 99 mmol/L (ref 96–106)
Creatinine, Ser: 0.88 mg/dL (ref 0.57–1.00)
GFR calc Af Amer: 72 mL/min/{1.73_m2} (ref >59)
GFR calc non Af Amer: 63 mL/min/{1.73_m2} (ref >59)
Globulin, Total: 3.1 g/dL (ref 1.5–4.5)
Glucose: 150 mg/dL — ABNORMAL HIGH (ref 65–99)
Potassium: 4.4 mmol/L (ref 3.5–5.2)
Sodium: 141 mmol/L (ref 134–144)
Total Protein: 7.1 g/dL (ref 6.0–8.5)

## 2017-03-11 LAB — CBC WITH DIFFERENTIAL/PLATELET
Basophils Absolute: 0.1 10*3/uL (ref 0.0–0.2)
Basos: 1 %
EOS (ABSOLUTE): 0.4 10*3/uL (ref 0.0–0.4)
Eos: 4 %
Hematocrit: 41.5 % (ref 34.0–46.6)
Hemoglobin: 14.4 g/dL (ref 11.1–15.9)
Immature Grans (Abs): 0 10*3/uL (ref 0.0–0.1)
Immature Granulocytes: 0 %
Lymphocytes Absolute: 2.7 10*3/uL (ref 0.7–3.1)
Lymphs: 29 %
MCH: 31.7 pg (ref 26.6–33.0)
MCHC: 34.7 g/dL (ref 31.5–35.7)
MCV: 91 fL (ref 79–97)
Monocytes Absolute: 0.6 10*3/uL (ref 0.1–0.9)
Monocytes: 7 %
Neutrophils Absolute: 5.4 10*3/uL (ref 1.4–7.0)
Neutrophils: 59 %
Platelets: 249 10*3/uL (ref 150–379)
RBC: 4.54 x10E6/uL (ref 3.77–5.28)
RDW: 13.3 % (ref 12.3–15.4)
WBC: 9.1 10*3/uL (ref 3.4–10.8)

## 2017-03-11 LAB — TSH: TSH: 0.899 u[IU]/mL (ref 0.450–4.500)

## 2017-03-12 ENCOUNTER — Other Ambulatory Visit (INDEPENDENT_AMBULATORY_CARE_PROVIDER_SITE_OTHER): Payer: Self-pay | Admitting: *Deleted

## 2017-03-12 DIAGNOSIS — R7309 Other abnormal glucose: Secondary | ICD-10-CM

## 2017-03-21 LAB — HEMOGLOBIN A1C
Est. average glucose Bld gHb Est-mCnc: 120 mg/dL
Hgb A1c MFr Bld: 5.8 % — ABNORMAL HIGH (ref 4.8–5.6)

## 2017-03-21 LAB — GLUCOSE, FASTING: Glucose, Plasma: 84 mg/dL (ref 65–99)

## 2017-04-29 ENCOUNTER — Other Ambulatory Visit (INDEPENDENT_AMBULATORY_CARE_PROVIDER_SITE_OTHER): Payer: Self-pay | Admitting: Internal Medicine

## 2017-06-26 ENCOUNTER — Other Ambulatory Visit (INDEPENDENT_AMBULATORY_CARE_PROVIDER_SITE_OTHER): Payer: Self-pay | Admitting: Internal Medicine

## 2017-09-15 ENCOUNTER — Encounter (INDEPENDENT_AMBULATORY_CARE_PROVIDER_SITE_OTHER): Payer: Self-pay | Admitting: Internal Medicine

## 2017-09-15 ENCOUNTER — Ambulatory Visit (INDEPENDENT_AMBULATORY_CARE_PROVIDER_SITE_OTHER): Payer: Medicare Other | Admitting: Internal Medicine

## 2017-09-15 VITALS — BP 140/80 | HR 72 | Temp 97.6°F | Resp 18 | Ht 63.0 in | Wt 170.9 lb

## 2017-09-15 DIAGNOSIS — K5100 Ulcerative (chronic) pancolitis without complications: Secondary | ICD-10-CM

## 2017-09-15 MED ORDER — MESALAMINE 400 MG PO CPDR: 800.0000 mg | DELAYED_RELEASE_CAPSULE | Freq: Two times a day (BID) | ORAL | 11 refills | Status: DC

## 2017-09-15 NOTE — Progress Notes (Signed)
Presenting complaint;  Follow-up for ulcerative colitis.  Database and subjective:  Patient is 82 year old Caucasian female with over 50-year history of ulcerative colitis who is here for scheduled visit. She had surveillance colonoscopy in November 2017.  She had 4 small polyps but biopsy revealed them not to be adenomas.  She had a nodular mucosa at sigmoid colon and biopsy revealed question colitis and no evidence of dysplasia.  Patient feels fine.  She has 1 formed stool daily.  She does not remember the last time she noted blood with her bowel movements.  Appetite is good and her weight has been stable.  She says she does not do scheduled exercise but she is busy every day.  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  She is not having any side effects with Humira.  She stated Asacol-HD is not the preferred medication anymore and it needs to be changed she has not had any blood work since her last visit..   Current Medications: Outpatient Encounter Medications as of 09/15/2017  Medication Sig  . ASACOL HD 800 MG TBEC TAKE TWO TABLETS BY MOUTH TWICE DAILY  . aspirin 81 MG tablet Take 1 tablet (81 mg total) by mouth daily. Tues, Thurs, Sat  . denosumab (PROLIA) 60 MG/ML SOLN injection Inject 60 mg into the skin every 6 (six) months. Administer in upper arm, thigh, or abdomen  . HUMIRA PEN 40 MG/0.8ML PNKT INJECT 40MG SQ EVERY 14 DAYS  . Magnesium 500 MG TABS Take 500 mg by mouth daily.  . Probiotic Product (ALIGN PO) Take 1 capsule by mouth daily.   . [DISCONTINUED] Polyethyl Glycol-Propyl Glycol (SYSTANE OP) Apply 1 drop to eye at bedtime as needed. Dry eyes.   No facility-administered encounter medications on file as of 09/15/2017.      Objective: Blood pressure 140/80, pulse 72, temperature 97.6 F (36.4 C), temperature source Oral, resp. rate 18, height 5' 3"  (1.6 m), weight 170 lb 14.4 oz (77.5 kg). Patient is alert and in no acute distress. Conjunctiva is pink.  Sclera is nonicteric Oropharyngeal mucosa is normal. No neck masses or thyromegaly noted. Cardiac exam with regular rhythm normal S1 and S2. No murmur or gallop noted. Lungs are clear to auscultation. Abdomen is symmetrical soft and nontender without organomegaly or masses. No LE edema or clubbing noted.    Assessment:  #1.  Pan ulcerative colitis in remission.  Last surveillance colonoscopy was in November 2017 and next one will be due in November 2022.  She is doing well with combination of biologic and oral mesalamine.  Prescription would be changed to a different mesalamine that is covered by her insurance. She is due for blood work.  Plan:  Continue Humira at current dose of 40 mg subcu every 14 days. Discontinue Azacol-HD. Begin Delzicol 800 mg p.o. twice daily.  Patient will go to the lab for Fasting blood work to include CBC and comprehensive chemistry panel. Office visit in 6 months.

## 2017-09-15 NOTE — Patient Instructions (Signed)
Physician will call with results of blood test when completed.

## 2017-09-17 ENCOUNTER — Telehealth (INDEPENDENT_AMBULATORY_CARE_PROVIDER_SITE_OTHER): Payer: Self-pay | Admitting: *Deleted

## 2017-09-17 NOTE — Telephone Encounter (Signed)
Patient's Insurance no longer covers Asacol , Delizcol wad snied. The preferred alternatives are Apriso , Generic Mesalamine , or Lialda.  Per Dr.Rehman - the patient may try the Apriso 1.5 grams daily (4 capsules) . #120 11 refills. This was called to [facility] and the patient has been made aware.

## 2017-09-18 ENCOUNTER — Encounter: Payer: Self-pay | Admitting: Internal Medicine

## 2017-09-24 ENCOUNTER — Telehealth (INDEPENDENT_AMBULATORY_CARE_PROVIDER_SITE_OTHER): Payer: Self-pay | Admitting: *Deleted

## 2017-09-24 NOTE — Telephone Encounter (Signed)
Results rec'd. Patient made aware. Dr.Rehman will review and we will let herknow Dr.Rehman's recommendation.

## 2017-09-24 NOTE — Telephone Encounter (Signed)
Patient was called. She states that her lab work was done on 09/18/2017 at [facility]. She can see that it has been released in the protal and she was told that it had been sent to [place]. At this time it has not been sent to [place]. I have called [facility] and requested them. Patient will be made aware when results are received.

## 2017-09-24 NOTE — Telephone Encounter (Signed)
Patient called left message for Tammy to call her about her blood work results. Patient stated they were released on her [facility] portal. [phone]

## 2017-11-26 ENCOUNTER — Telehealth (INDEPENDENT_AMBULATORY_CARE_PROVIDER_SITE_OTHER): Payer: Self-pay | Admitting: *Deleted

## 2017-11-26 NOTE — Telephone Encounter (Signed)
Patient's pharmacy sent a fax stating that the apriso was on a back order by the manufacturer. They has ask if it could be changed to Delzicol or Pentasa. Patient's Insurance will not cover the Asacol or Delzicol . They cover the Apriso , Generic Mesalamine , Lialda.  Per Dr.Rehman - patient may use the Lialda 2.4 grams twice a day. 1 month 5 refills. This was called to [facility]. He states that the back order should relent and the Apriso will be made available again.  Patient was called and a message was left for her with Dr.Rehman's recommendation.

## 2018-03-08 ENCOUNTER — Encounter (HOSPITAL_COMMUNITY): Payer: Self-pay | Admitting: *Deleted

## 2018-03-08 ENCOUNTER — Other Ambulatory Visit: Payer: Self-pay

## 2018-03-08 ENCOUNTER — Inpatient Hospital Stay (HOSPITAL_COMMUNITY)
Admission: AD | Admit: 2018-03-08 | Discharge: 2018-03-09 | DRG: 300 | Disposition: A | Discharge status: Home / Self Care | Payer: Medicare Other | Source: Other Acute Inpatient Hospital | Attending: Internal Medicine | Admitting: Internal Medicine

## 2018-03-08 DIAGNOSIS — E871 Hypo-osmolality and hyponatremia: Secondary | ICD-10-CM | POA: Diagnosis present

## 2018-03-08 DIAGNOSIS — Z79899 Other long term (current) drug therapy: Secondary | ICD-10-CM

## 2018-03-08 DIAGNOSIS — Z86718 Personal history of other venous thrombosis and embolism: Secondary | ICD-10-CM

## 2018-03-08 DIAGNOSIS — Z95828 Presence of other vascular implants and grafts: Secondary | ICD-10-CM | POA: Diagnosis not present

## 2018-03-08 DIAGNOSIS — Z7982 Long term (current) use of aspirin: Secondary | ICD-10-CM

## 2018-03-08 DIAGNOSIS — K5120 Ulcerative (chronic) proctitis without complications: Secondary | ICD-10-CM | POA: Diagnosis not present

## 2018-03-08 DIAGNOSIS — Z888 Allergy status to other drugs, medicaments and biological substances status: Secondary | ICD-10-CM

## 2018-03-08 DIAGNOSIS — Z8673 Personal history of transient ischemic attack (TIA), and cerebral infarction without residual deficits: Secondary | ICD-10-CM | POA: Diagnosis not present

## 2018-03-08 DIAGNOSIS — D500 Iron deficiency anemia secondary to blood loss (chronic): Secondary | ICD-10-CM | POA: Diagnosis present

## 2018-03-08 DIAGNOSIS — Z853 Personal history of malignant neoplasm of breast: Secondary | ICD-10-CM | POA: Diagnosis not present

## 2018-03-08 DIAGNOSIS — Z87891 Personal history of nicotine dependence: Secondary | ICD-10-CM | POA: Diagnosis not present

## 2018-03-08 DIAGNOSIS — K644 Residual hemorrhoidal skin tags: Secondary | ICD-10-CM | POA: Diagnosis present

## 2018-03-08 DIAGNOSIS — Z9011 Acquired absence of right breast and nipple: Secondary | ICD-10-CM | POA: Diagnosis not present

## 2018-03-08 DIAGNOSIS — Z882 Allergy status to sulfonamides status: Secondary | ICD-10-CM

## 2018-03-08 DIAGNOSIS — K5190 Ulcerative colitis, unspecified, without complications: Secondary | ICD-10-CM | POA: Diagnosis present

## 2018-03-08 DIAGNOSIS — I82401 Acute embolism and thrombosis of unspecified deep veins of right lower extremity: Principal | ICD-10-CM | POA: Diagnosis present

## 2018-03-08 DIAGNOSIS — R739 Hyperglycemia, unspecified: Secondary | ICD-10-CM | POA: Diagnosis present

## 2018-03-08 DIAGNOSIS — I4510 Unspecified right bundle-branch block: Secondary | ICD-10-CM | POA: Diagnosis present

## 2018-03-08 LAB — CBC WITH DIFFERENTIAL/PLATELET
Basophils Absolute: 0 10*3/uL (ref 0.0–0.1)
Basophils Relative: 0 %
Eosinophils Absolute: 0.2 10*3/uL (ref 0.0–0.7)
Eosinophils Relative: 2 %
HCT: 37.7 % (ref 36.0–46.0)
Hemoglobin: 12.4 g/dL (ref 12.0–15.0)
Lymphocytes Relative: 25 %
Lymphs Abs: 2.6 10*3/uL (ref 0.7–4.0)
MCH: 31 pg (ref 26.0–34.0)
MCHC: 32.9 g/dL (ref 30.0–36.0)
MCV: 94.3 fL (ref 78.0–100.0)
Monocytes Absolute: 1.1 10*3/uL — ABNORMAL HIGH (ref 0.1–1.0)
Monocytes Relative: 10 %
Neutro Abs: 6.7 10*3/uL (ref 1.7–7.7)
Neutrophils Relative %: 63 %
Platelets: 273 10*3/uL (ref 150–400)
RBC: 4 MIL/uL (ref 3.87–5.11)
RDW: 12.6 % (ref 11.5–15.5)
WBC: 10.6 10*3/uL — ABNORMAL HIGH (ref 4.0–10.5)

## 2018-03-08 LAB — COMPREHENSIVE METABOLIC PANEL
ALT: 11 U/L (ref 0–44)
AST: 14 U/L — ABNORMAL LOW (ref 15–41)
Albumin: 3.6 g/dL (ref 3.5–5.0)
Alkaline Phosphatase: 35 U/L — ABNORMAL LOW (ref 38–126)
Anion gap: 9 (ref 5–15)
BUN: 24 mg/dL — ABNORMAL HIGH (ref 8–23)
CO2: 28 mmol/L (ref 22–32)
Calcium: 9 mg/dL (ref 8.9–10.3)
Chloride: 96 mmol/L — ABNORMAL LOW (ref 98–111)
Creatinine, Ser: 1.16 mg/dL — ABNORMAL HIGH (ref 0.44–1.00)
GFR calc Af Amer: 50 mL/min — ABNORMAL LOW (ref >60)
GFR calc non Af Amer: 43 mL/min — ABNORMAL LOW (ref >60)
Glucose, Bld: 103 mg/dL — ABNORMAL HIGH (ref 70–99)
Potassium: 3.8 mmol/L (ref 3.5–5.1)
Sodium: 133 mmol/L — ABNORMAL LOW (ref 135–145)
Total Bilirubin: 0.6 mg/dL (ref 0.3–1.2)
Total Protein: 8 g/dL (ref 6.5–8.1)

## 2018-03-08 LAB — PROTIME-INR
INR: 1.14
Prothrombin Time: 14.5 seconds (ref 11.4–15.2)

## 2018-03-08 LAB — APTT: aPTT: 35 seconds (ref 24–36)

## 2018-03-08 MED ORDER — ACETAMINOPHEN 325 MG PO TABS: 650.0000 mg | ORAL_TABLET | Freq: Four times a day (QID) | ORAL | Status: DC | PRN

## 2018-03-08 MED ORDER — ONDANSETRON HCL 4 MG/2ML IJ SOLN: 4.0000 mg | Freq: Four times a day (QID) | INTRAMUSCULAR | Status: DC | PRN

## 2018-03-08 MED ORDER — PANTOPRAZOLE SODIUM 40 MG PO TBEC
40.0000 mg | DELAYED_RELEASE_TABLET | Freq: Every day | ORAL | Status: DC
  Administered 2018-03-08: 40 mg via ORAL
  Filled 2018-03-08 (×2): qty 1

## 2018-03-08 MED ORDER — HEPARIN (PORCINE) IN NACL 100-0.45 UNIT/ML-% IJ SOLN
900.0000 [IU]/h | INTRAMUSCULAR | Status: DC
  Administered 2018-03-09: 900 [IU]/h via INTRAVENOUS
  Filled 2018-03-08: qty 250

## 2018-03-08 MED ORDER — ACETAMINOPHEN 650 MG RE SUPP: 650.0000 mg | Freq: Four times a day (QID) | RECTAL | Status: DC | PRN

## 2018-03-08 MED ORDER — FAMOTIDINE 20 MG PO TABS
20.0000 mg | ORAL_TABLET | Freq: Every day | ORAL | Status: DC
  Administered 2018-03-08: 20 mg via ORAL
  Filled 2018-03-08: qty 1

## 2018-03-08 MED ORDER — ONDANSETRON HCL 4 MG PO TABS: 4.0000 mg | ORAL_TABLET | Freq: Four times a day (QID) | ORAL | Status: DC | PRN

## 2018-03-08 NOTE — Progress Notes (Signed)
ANTICOAGULATION CONSULT NOTE - Initial Consult  Pharmacy Consult for Heparin Indication: DVT  Allergies  Allergen Reactions  . Mercaptopurine Other (See Comments)    Dropped WBC  . Sulfa Antibiotics Other (See Comments)    Extreme Weakness   Patient Measurements: Height: 5' 3"  (160 cm) Weight: 172 lb 6.4 oz (78.2 kg) IBW/kg (Calculated) : 52.4 HEPARIN DW (KG): 69.3   Vital Signs:    Labs: Recent Labs    03/08/18 1840  HGB 12.4  HCT 37.7  PLT 273  APTT 35  LABPROT 14.5  INR 1.14  CREATININE 1.16*   Estimated Creatinine Clearance: 38.3 mL/min (A) (by C-G formula based on SCr of 1.16 mg/dL (H)).  Medical History: Past Medical History:  Diagnosis Date  . Cancer [facility]) 2009   right breast cancer; mastectomy  . Cancer [facility]) 1998   left; lumpectomy  . DVT (deep venous thrombosis) ([place])   . Hemorrhoids, external   . HX: breast cancer   . Lower abdominal pain   . Occult blood in stools   . Seizures ([place])    one very slight seizure with the stroke; somewhere between 2005-2009  . Stroke [facility]) 2005?   "very light;" also had one small seizure with stroke   . UC (ulcerative colitis) ([place])    Medications:  Medications Prior to Admission  Medication Sig Dispense Refill Last Dose  . aspirin 81 MG tablet Take 81 mg by mouth 3 (three) times a week. Tues, Thurs, Sat only 30 tablet  03/06/2018 at Unknown time  . clobetasol (TEMOVATE) 0.05 % external solution Apply 1 application topically 2 (two) times daily as needed (per Dermatology).    unknown  . denosumab (PROLIA) 60 MG/ML SOLN injection Inject 60 mg into the skin every 6 (six) months. Administer in upper arm, thigh, or abdomen   09/08/2017  . HUMIRA PEN 40 MG/0.8ML PNKT INJECT 40MG SQ EVERY 14 DAYS 2 each 11 03/03/2018 at Unknown time  . Magnesium 500 MG TABS Take 500 mg by mouth daily.   03/08/2018 at Unknown time  . mesalamine (LIALDA) 1.2 g EC tablet Take 2 tablets by mouth 2 (two) times daily.   03/08/2018 at Unknown time   . triamterene-hydrochlorothiazide (MAXZIDE-25) 37.5-25 MG tablet Take 1 tablet by mouth every morning.    03/08/2018 at Unknown time   Assessment: Okay for Protocol, [facility]. Hx previous DVT with subsequent GI Bleed while on Warfarin.  IVC filter placed.  Goal of Therapy:  Heparin level 0.3-0.7 units/ml Monitor platelets by anticoagulation protocol: Yes   Plan:  No Bolus, begin IV Heparin @ midnight Start heparin infusion at 900 units/hr Check anti-Xa level in 6-8 hours and daily while on heparin Continue to monitor H&H and platelets  HPricilla Larsson7/22/2019,9:37 PM

## 2018-03-08 NOTE — H&P (Signed)
History and Physical    Monique Day [id number]:[id number] DOB: 03-11-1936 DOA: 03/08/2018  PCP: Octavio Graves, DO   Patient coming from: Home.  I have personally briefly reviewed patient's old medical records in [place]  Chief Complaint: Right lower extremity DVT.  HPI: Monique Day is a 82 y.o. female with medical history significant of breast cancer, history of right lumpectomy, then followed by right mastectomy, external hemorrhoids, history of heme-positive stools, ulcerative colitis, history of small CVA, history of seizures due to CVA, right lower extremity DVT with history of GI bleed  and blood loss anemia while on warfarin, IVC filter placement who is referred from the [facility] to this facility for close monitoring during anticoagulation due to previous gastrointestinal bleeding and lack of GI service at that facility.  Per patient, about 11 days ago she syncopized due to apparent dehydration.  She was subsequently discharged home and was drinking a large amount of water while at home.  She saw her general practitioner, who prescribed hydrochlorothiazide 25 mg p.o. daily for edema.  The patient mentions that after 2-3 days the swelling on her left lower extremity decrease, but did not on the right leg.  She denies fever, chills, sore throat, hemoptysis, wheezing, dyspnea, chest pain, palpitations, present or recent dizziness, diaphoresis, PND orthopnea.  No abdominal pain, nausea, emesis, diarrhea, obstipation, melena or hematochezia.  Denies dysuria, frequency or hematuria.  No polyuria, polydipsia or blurred vision.  Denies heat or cold intolerance.  ED Course: The patient was given Lovenox 80 mg SQ x1. Please see per records sent from [facility].  PT/INR and PTT were within normal limits.  White count was 10.6, hemoglobin 12.4 and platelets 273.  Sodium was 133, potassium 3.8, chloride 96 and CO2 28 mmol/L.  Glucose 103, BUN 24 and creatinine 1.16 mg/dL.   AST was 14 and alkaline phosphatase was 35, all other values are within normal limits.    Review of Systems: As per HPI otherwise 10 point review of systems negative.   Past Medical History:  Diagnosis Date  . Cancer [facility]) 2009   right breast cancer; mastectomy  . Cancer [facility]) 1998   left; lumpectomy  . DVT (deep venous thrombosis) ([place])   . Hemorrhoids, external   . HX: breast cancer   . Lower abdominal pain   . Occult blood in stools   . Seizures ([place])    one very slight seizure with the stroke; somewhere between 2005-2009  . Stroke [facility]) 2005?   "very light;" also had one small seizure with stroke   . UC (ulcerative colitis) [facility])     Past Surgical History:  Procedure Laterality Date  . bone density  12/11/10  . BREAST LUMPECTOMY Left 1998  . COLONOSCOPY  02/21/2011  . COLONOSCOPY  10/06/2008  . COLONOSCOPY  12/27/01  . COLONOSCOPY  05/09/05  . COLONOSCOPY N/A 07/02/2016   Procedure: COLONOSCOPY;  Surgeon: [name], MD;  Location: [facility]ITE;  Service: Endoscopy;  Laterality: N/A;  1055  . EXPLORATORY LAPAROTOMY    . Left Eye     C[place]act  . MASTECTOMY Right 2009     reports that she quit smoking about 16 years ago. Her smoking use included cigarettes. She has never used smokeless tobacco. She reports that she does not drink alcohol or use drugs.  Allergies  Allergen Reactions  . Mercaptopurine Other (See Comments)    Dropped WBC  . Sulfa Antibiotics Other (See Comments)    Extreme  Weakness    Family History  Problem Relation Age of Onset  . Prostate cancer Father   . Colon cancer Neg Hx     Prior to Admission medications   Medication Sig Start Date End Date Taking? Authorizing Provider  aspirin 81 MG tablet Take 1 tablet (81 mg total) by mouth daily. Tues, Thurs, Sat 07/03/16  Yes Rehman, Mechele Dawley, MD  HUMIRA PEN 40 MG/0.8ML PNKT INJECT 40MG SQ EVERY 14 DAYS 06/29/17  Yes Rehman, Mechele Dawley, MD  Magnesium 500 MG TABS Take 500 mg by mouth daily.    Yes [provider]  Mesalamine (ASACOL) 400 MG CPDR DR capsule Take 2 capsules (800 mg total) by mouth 2 (two) times daily. 09/15/17  Yes Rehman, Mechele Dawley, MD  denosumab (PROLIA) 60 MG/ML SOLN injection Inject 60 mg into the skin every 6 (six) months. Administer in upper arm, thigh, or abdomen    [provider]    Physical Exam: Vitals:   03/08/18 1959 03/08/18 2100 03/08/18 2143  BP:   120/67  Pulse:   82  Resp:   16  Temp:   98.5 F (36.9 C)  TempSrc:   Oral  SpO2: 94%  95%  Weight:  78.2 kg (172 lb 6.4 oz)   Height:  5' 3"  (1.6 m)    Constitutional: NAD, calm, comfortable Eyes: PERRL, lids and conjunctivae normal ENMT: Mucous membranes are moist. Posterior pharynx clear of any exudate or lesions. Neck: normal, supple, no masses, no thyromegaly Respiratory: clear to auscultation bilaterally, no wheezing, no crackles. Normal respiratory effort. No accessory muscle use.  Cardiovascular: Regular rate and rhythm, no murmurs / rubs / gallops. No extremity edema. 2+ pedal pulses. No carotid bruits.  Abdomen: Soft, no tenderness, no masses palpated. No hepatosplenomegaly.  Musculoskeletal: no clubbing / cyanosis. No joint deformity upper and lower extremities. Good ROM, no contractures. Normal muscle tone.  Skin: no rashes, lesions, ulcers on limited exam Neurologic: CN 2-12 grossly intact. Sensation intact, DTR normal. Strength 5/5 in all 4.  Psychiatric: Normal judgment and insight. Alert and oriented x 4. Normal mood.    Labs on Admission: I have personally reviewed following labs and imaging studies  CBC: Recent Labs  Lab 03/08/18 1840  WBC 10.6*  NEUTROABS 6.7  HGB 12.4  HCT 37.7  MCV 94.3  PLT 710   Basic Metabolic Panel: Recent Labs  Lab 03/08/18 1840  NA 133*  K 3.8  CL 96*  CO2 28  GLUCOSE 103*  BUN 24*  CREATININE 1.16*  CALCIUM 9.0   GFR: CrCl cannot be calculated (Unknown ideal weight.). Liver Function Tests: Recent Labs  Lab  03/08/18 1840  AST 14*  ALT 11  ALKPHOS 35*  BILITOT 0.6  PROT 8.0  ALBUMIN 3.6   No results for input(s): LIPASE, AMYLASE in the last 168 hours. No results for input(s): AMMONIA in the last 168 hours. Coagulation Profile: Recent Labs  Lab 03/08/18 1840  INR 1.14   Cardiac Enzymes: No results for input(s): CKTOTAL, CKMB, CKMBINDEX, TROPONINI in the last 168 hours. BNP (last 3 results) No results for input(s): PROBNP in the last 8760 hours. HbA1C: No results for input(s): HGBA1C in the last 72 hours. CBG: No results for input(s): GLUCAP in the last 168 hours. Lipid Profile: No results for input(s): CHOL, HDL, LDLCALC, TRIG, CHOLHDL, LDLDIRECT in the last 72 hours. Thyroid Function Tests: No results for input(s): TSH, T4TOTAL, FREET4, T3FREE, THYROIDAB in the last 72 hours. Anemia Panel: No results  for input(s): VITAMINB12, FOLATE, FERRITIN, TIBC, IRON, RETICCTPCT in the last 72 hours. Urine analysis:    Component Value Date/Time   COLORURINE YELLOW 06/07/2012 0916   APPEARANCEUR CLEAR 06/07/2012 0916   LABSPEC >1.030 (H) 06/07/2012 0916   PHURINE 5.5 06/07/2012 0916   GLUCOSEU NEGATIVE 06/07/2012 0916   HGBUR TRACE (A) 06/07/2012 0916   BILIRUBINUR SMALL (A) 06/07/2012 0916   KETONESUR TRACE (A) 06/07/2012 0916   PROTEINUR NEGATIVE 06/07/2012 0916   UROBILINOGEN 0.2 06/07/2012 0916   NITRITE NEGATIVE 06/07/2012 0916   LEUKOCYTESUR NEGATIVE 06/07/2012 0916    Radiological Exams on Admission: No results found.  EKG: Independently reviewed.  EKG at more her showed RBBB. This was described as new, but the patient has partial RBBB on all EKGs.  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 After ED PR [facility] discussed the case with Dr. Laural Golden, the patient is high risk for recurrent GI bleed.  While on warfarin before, the patient does not recall an  H2 blocker or PPI.  I will start heparin per pharmacy with daily CBC and platelet follow-up after about 12 hours have elapsed since her Lovenox 80 mg injection at the [facility] ED.  I will start the patient on daily Protonix 40 mg and nocturnal famotidine 20 mg p.o.  Monitor closely for any signs of bleeding, occult blood in stool or decrease in hemoglobin.  Consult Dr. Laural Golden as needed.  Active Problems:   UC (ulcerative colitis confined to rectum) (HCC) No symptoms at this time. Continue Humira injections as scheduled. Follow-ups with Dr. Laural Golden as scheduled or as needed.    Hyponatremia May be as a result of hemodilution and recent use of HCTZ. Follow-up sodium level tomorrow.    Hyperglycemia Follow-up fasting glucose. Consider hemoglobin A1c check if abnormal.    RBBB Upon review of old EKGs, it seems that at least there was a partial conduction abnormality of the right bundle branch all the way back to 2013.  However, will continue cardiac monitoring.   DVT prophylaxis: On full dose heparin. Code Status: Full code. Family Communication: Disposition Plan: Admit for DVT treatment with close monitoring. Consults called:  Admission status: Inpatient/telemetry.   Reubin Milan MD [facility] Pager [phone].  If 7PM-7AM, please contact night-coverage [email or web address] Password [email or web address]  03/08/2018, 8:08 PM

## 2018-03-09 ENCOUNTER — Encounter (HOSPITAL_COMMUNITY): Payer: Self-pay

## 2018-03-09 DIAGNOSIS — I82401 Acute embolism and thrombosis of unspecified deep veins of right lower extremity: Principal | ICD-10-CM

## 2018-03-09 DIAGNOSIS — K5120 Ulcerative (chronic) proctitis without complications: Secondary | ICD-10-CM

## 2018-03-09 DIAGNOSIS — I4510 Unspecified right bundle-branch block: Secondary | ICD-10-CM

## 2018-03-09 LAB — BASIC METABOLIC PANEL
Anion gap: 9 (ref 5–15)
BUN: 23 mg/dL (ref 8–23)
CO2: 26 mmol/L (ref 22–32)
Calcium: 9 mg/dL (ref 8.9–10.3)
Chloride: 100 mmol/L (ref 98–111)
Creatinine, Ser: 1.04 mg/dL — ABNORMAL HIGH (ref 0.44–1.00)
GFR calc Af Amer: 57 mL/min — ABNORMAL LOW (ref >60)
GFR calc non Af Amer: 49 mL/min — ABNORMAL LOW (ref >60)
Glucose, Bld: 111 mg/dL — ABNORMAL HIGH (ref 70–99)
Potassium: 3.6 mmol/L (ref 3.5–5.1)
Sodium: 135 mmol/L (ref 135–145)

## 2018-03-09 LAB — HEPARIN LEVEL (UNFRACTIONATED): Heparin Unfractionated: 0.62 IU/mL (ref 0.30–0.70)

## 2018-03-09 MED ORDER — APIXABAN 5 MG PO TABS: 5.0000 mg | ORAL_TABLET | Freq: Two times a day (BID) | ORAL | Status: DC

## 2018-03-09 MED ORDER — APIXABAN 5 MG PO TABS
10.0000 mg | ORAL_TABLET | Freq: Two times a day (BID) | ORAL | Status: DC
  Administered 2018-03-09: 10 mg via ORAL
  Filled 2018-03-09: qty 2

## 2018-03-09 MED ORDER — APIXABAN 5 MG PO TABS: 10.0000 mg | ORAL_TABLET | Freq: Two times a day (BID) | ORAL | 0 refills | Status: DC

## 2018-03-09 NOTE — Progress Notes (Addendum)
Discharge instructions reviewed with patient this afternoon prior to discharge. Given copy of AVS. Prescription sent to her pharmacy by MD, pt aware to pick it up. Pt received Eliquis education from pharmacy and nursing staff. Verbalized understanding of instructions, when to follow-up and when to seek medical attention for any concerns. Pt received voucher for Eliquis from MD prior to discharge. IV site d/c'd, site within normal limits. Pt left floor in stable condition via w/c accompanied by nurse tech. Donavan Foil, RN

## 2018-03-09 NOTE — Discharge Instructions (Signed)
Information on my medicine - ELIQUIS (apixaban)  This medication education was reviewed with me or my healthcare representative as part of my discharge preparation.   Why was Eliquis prescribed for you? Eliquis was prescribed to treat blood clots that may have been found in the veins of your legs (deep vein thrombosis) or in your lungs (pulmonary embolism) and to reduce the risk of them occurring again.  What do You need to know about Eliquis ? The starting dose is 10 mg (two 5 mg tablets) taken TWICE daily for the FIRST SEVEN (7) DAYS, then  the dose is reduced to ONE 5 mg tablet taken TWICE daily.  Eliquis may be taken with or without food.   Try to take the dose about the same time in the morning and in the evening. If you have difficulty swallowing the tablet whole please discuss with your pharmacist how to take the medication safely.  Take Eliquis exactly as prescribed and DO NOT stop taking Eliquis without talking to the doctor who prescribed the medication.  Stopping may increase your risk of developing a new blood clot.  Refill your prescription before you run out.  After discharge, you should have regular check-up appointments with your healthcare provider that is prescribing your Eliquis.    What do you do if you miss a dose? If a dose of ELIQUIS is not taken at the scheduled time, take it as soon as possible on the same day and twice-daily administration should be resumed. The dose should not be doubled to make up for a missed dose.  Important Safety Information A possible side effect of Eliquis is bleeding. You should call your healthcare provider right away if you experience any of the following: ? Bleeding from an injury or your nose that does not stop. ? Unusual colored urine (red or dark brown) or unusual colored stools (red or black). ? Unusual bruising for unknown reasons. ? A serious fall or if you hit your head (even if there is no bleeding).  Some medicines may  interact with Eliquis and might increase your risk of bleeding or clotting while on Eliquis. To help avoid this, consult your healthcare provider or pharmacist prior to using any new prescription or non-prescription medications, including herbals, vitamins, non-steroidal anti-inflammatory drugs (NSAIDs) and supplements.  This website has more information on Eliquis (apixaban): http://[email or web address].com/eliquis/home

## 2018-03-09 NOTE — Progress Notes (Signed)
[place] for Heparin Indication: DVT  Allergies  Allergen Reactions  . Mercaptopurine Other (See Comments)    Dropped WBC  . Sulfa Antibiotics Other (See Comments)    Extreme Weakness   Patient Measurements: Height: 5' 3"  (160 cm) Weight: 172 lb 6.4 oz (78.2 kg) IBW/kg (Calculated) : 52.4 HEPARIN DW (KG): 69.3   Vital Signs: Temp: 98.1 F (36.7 C) (07/23 0614) Temp Source: Oral (07/23 0614) BP: 107/64 (07/23 0614) Pulse Rate: 71 (07/23 0614)  Labs: Recent Labs    03/08/18 1840 03/09/18 0555 03/09/18 0752  HGB 12.4  --   --   HCT 37.7  --   --   PLT 273  --   --   APTT 35  --   --   LABPROT 14.5  --   --   INR 1.14  --   --   HEPARINUNFRC  --   --  0.62  CREATININE 1.16* 1.04*  --    Estimated Creatinine Clearance: 42.7 mL/min (A) (by C-G formula based on SCr of 1.04 mg/dL (H)).  Medical History: Past Medical History:  Diagnosis Date  . Cancer [facility]) 2009   right breast cancer; mastectomy  . Cancer [facility]) 1998   left; lumpectomy  . DVT (deep venous thrombosis) ([place])   . Hemorrhoids, external   . HX: breast cancer   . Lower abdominal pain   . Occult blood in stools   . Seizures ([place])    one very slight seizure with the stroke; somewhere between 2005-2009  . Stroke [facility]) 2005?   "very light;" also had one small seizure with stroke   . UC (ulcerative colitis) ([place])    Medications:  Medications Prior to Admission  Medication Sig Dispense Refill Last Dose  . aspirin 81 MG tablet Take 81 mg by mouth 3 (three) times a week. Tues, Thurs, Sat only 30 tablet  03/06/2018 at Unknown time  . clobetasol (TEMOVATE) 0.05 % external solution Apply 1 application topically 2 (two) times daily as needed (per Dermatology).    unknown  . denosumab (PROLIA) 60 MG/ML SOLN injection Inject 60 mg into the skin every 6 (six) months. Administer in upper arm, thigh, or abdomen   09/08/2017  . HUMIRA PEN 40 MG/0.8ML PNKT INJECT 40MG SQ EVERY 14  DAYS 2 each 11 03/03/2018 at Unknown time  . Magnesium 500 MG TABS Take 500 mg by mouth daily.   03/08/2018 at Unknown time  . mesalamine (LIALDA) 1.2 g EC tablet Take 2 tablets by mouth 2 (two) times daily.   03/08/2018 at Unknown time  . triamterene-hydrochlorothiazide (MAXZIDE-25) 37.5-25 MG tablet Take 1 tablet by mouth every morning.    03/08/2018 at Unknown time   Assessment: Okay for Protocol, Patient [facility] per MD note. Hx previous DVT with subsequent GI Bleed while on Warfarin.  IVC filter placed.  Goal of Therapy:  Heparin level 0.3-0.7 units/ml Monitor platelets by anticoagulation protocol: Yes   Plan:  Continue heparin infusion at 900 units/hr Check anti-Xa level  daily while on heparin Continue to monitor H&H and platelets  SMargot Ables PharmD Clinical Pharmacist 03/09/2018 8:45 AM

## 2018-03-09 NOTE — Discharge Summary (Signed)
Physician Discharge Summary  Monique Day [id number]:[id number] DOB: 06-25-1936 DOA: 03/08/2018  PCP: Octavio Graves, DO  Admit date: 03/08/2018 Discharge date: 03/09/2018  Admitted From: home Disposition:  Home   Recommendations for Outpatient Follow-up:  1. Follow up with PCP in 1-2 weeks 2. Please obtain BMP/CBC in one week     Discharge Condition: Stable CODE STATUS: FULL Diet recommendation: Heart Healthy   Brief/Interim Summary: 82 year old female with a history of ulcerative colitis, stroke, right lower extremity DVT, IVC filter, right breast cancer status post mastectomy presenting with 2 to 3-day history of right lower extremity edema.  The patient had a syncopal episode approximately 12 days prior to this admission.  She was admitted to [facility].  Work-up at that time included CTA of the chest which was negative for pulmonary embolus.  It was felt that her syncopal episode was due to dehydration.  She was told to increase her fluid intake.  Subsequently, the patient noticed increasing bilateral lower extremity edema.  She went to see her PCP on 03/03/2018.  She was prescribed triamterene HCTZ.  She noticed improvement in her left lower extremity edema, but her right lower extremity did not improve, and she noted some discomfort with ambulation.  She went back to the emergency department at [facility] on 03/08/18 where an ultrasound revealed an extensive DVT from her left knee to her pelvis.  The patient was given an injection of Lovenox.  With the patient's given history of GI bleed and ulcerative colitis and high risk of bleed, the patient was transferred to [facility] for observation while starting on anticoagulation as there is no GI coverage at [facility].  The patient was subsequently started on heparin IV.  She was transitioned to oral apixaban.  The patient did not have any signs of active bleeding. Notably, the patient had a DVT in her right lower extremity a little over 10 years ago.  Around the  same time, the patient developed an ulcerative colitis flare.  She developed massive GI bleed requiring hospitalization.  As result, an IVC filter was placed at that time, and warfarin was stopped.  The patient stated that she had completed 2 to 3 years of warfarin at that time.  She has not had any bleeding complications since discontinuation of the warfarin.  Her ulcerative colitis has been quiesced sent.  Discharge Diagnoses:  Recurrent DVT -Start apixaban--discussed the risks, benefits, and alternatives; the patient expressed understanding and agrees to follow her treatment protocol -d/c ASA  Hyponatremia -Secondary to triamterene hydrochlorothiazide -Improved with holding the medication -Follow-up with PCP regarding continuation of the medication  Ulcerative colitis -Colitis and at this time without exacerbation -Continue Humira q. Wednesday -Continue mesalamine     Discharge Instructions   Allergies as of 03/09/2018      Reactions   Mercaptopurine Other (See Comments)   Dropped WBC   Sulfa Antibiotics Other (See Comments)   Extreme Weakness      Medication List    STOP taking these medications   aspirin 81 MG tablet     TAKE these medications   apixaban 5 MG Tabs tablet Commonly known as:  ELIQUIS Take 2 tablets (10 mg total) by mouth 2 (two) times daily. Then 1 tablet (5 mg) two times daily starting 03/16/18   clobetasol 0.05 % external solution Commonly known as:  TEMOVATE Apply 1 application topically 2 (two) times daily as needed (per Dermatology).   denosumab 60 MG/ML Soln injection Commonly known as:  PROLIA Inject 60 mg  into the skin every 6 (six) months. Administer in upper arm, thigh, or abdomen   HUMIRA PEN 40 MG/0.8ML Pnkt Generic drug:  Adalimumab INJECT 40MG SQ EVERY 14 DAYS   Magnesium 500 MG Tabs Take 500 mg by mouth daily.   mesalamine 1.2 g EC tablet Commonly known as:  LIALDA Take 2 tablets by mouth 2 (two) times daily.     triamterene-hydrochlorothiazide 37.5-25 MG tablet Commonly known as:  MAXZIDE-25 Take 1 tablet by mouth every morning.      Follow-up Information    Octavio Graves, DO. Schedule an appointment as soon as possible for a visit.   Why:  Call for appointment for follow-up in 1-2 weeks.  Contact information: [address] [place] [address] [phone]          Allergies  Allergen Reactions  . Mercaptopurine Other (See Comments)    Dropped WBC  . Sulfa Antibiotics Other (See Comments)    Extreme Weakness    Consultations:  none   Procedures/Studies: No results found.      Discharge Exam: Vitals:   03/09/18 0614 03/09/18 1429  BP: 107/64 111/68  Pulse: 71 79  Resp: 18 16  Temp: 98.1 F (36.7 C) 98.1 F (36.7 C)  SpO2: 94% 98%   Vitals:   03/08/18 2100 03/08/18 2143 03/09/18 0614 03/09/18 1429  BP:  120/67 107/64 111/68  Pulse:  82 71 79  Resp:  16 18 16   Temp:  98.5 F (36.9 C) 98.1 F (36.7 C) 98.1 F (36.7 C)  TempSrc:  Oral Oral Oral  SpO2:  95% 94% 98%  Weight: 78.2 kg (172 lb 6.4 oz)     Height: 5' 3"  (1.6 m)       General: Pt is alert, awake, not in acute distress Cardiovascular: RRR, S1/S2 +, no rubs, no gallops Respiratory: CTA bilaterally, no wheezing, no rhonchi Abdominal: Soft, NT, ND, bowel sounds + Extremities: no edema, no cyanosis   The results of significant diagnostics from this hospitalization (including imaging, microbiology, ancillary and laboratory) are listed below for reference.    Significant Diagnostic Studies: No results found.   Microbiology: No results found for this or any previous visit (from the past 240 hour(s)).   Labs: Basic Metabolic Panel: Recent Labs  Lab 03/08/18 1840 03/09/18 0555  NA 133* 135  K 3.8 3.6  CL 96* 100  CO2 28 26  GLUCOSE 103* 111*  BUN 24* 23  CREATININE 1.16* 1.04*  CALCIUM 9.0 9.0   Liver Function Tests: Recent Labs  Lab 03/08/18 1840  AST 14*  ALT 11   ALKPHOS 35*  BILITOT 0.6  PROT 8.0  ALBUMIN 3.6   No results for input(s): LIPASE, AMYLASE in the last 168 hours. No results for input(s): AMMONIA in the last 168 hours. CBC: Recent Labs  Lab 03/08/18 1840  WBC 10.6*  NEUTROABS 6.7  HGB 12.4  HCT 37.7  MCV 94.3  PLT 273   Cardiac Enzymes: No results for input(s): CKTOTAL, CKMB, CKMBINDEX, TROPONINI in the last 168 hours. BNP: Invalid input(s): POCBNP CBG: No results for input(s): GLUCAP in the last 168 hours.  Time coordinating discharge:  36 minutes  Signed:  Orson Eva, DO [facility] Pager: [phone] 03/09/2018, 4:16 PM

## 2018-03-09 NOTE — Progress Notes (Signed)
ANTICOAGULATION CONSULT NOTE - Initial Consult  Pharmacy Consult for apixaban Indication: DVT  Allergies  Allergen Reactions  . Mercaptopurine Other (See Comments)    Dropped WBC  . Sulfa Antibiotics Other (See Comments)    Extreme Weakness    Patient Measurements: Height: 5' 3"  (160 cm) Weight: 172 lb 6.4 oz (78.2 kg) IBW/kg (Calculated) : 52.4   Vital Signs: Temp: 98.1 F (36.7 C) (07/23 0614) Temp Source: Oral (07/23 0614) BP: 107/64 (07/23 8315) Pulse Rate: 71 (07/23 0614)  Labs: Recent Labs    03/08/18 1840 03/09/18 0555 03/09/18 0752  HGB 12.4  --   --   HCT 37.7  --   --   PLT 273  --   --   APTT 35  --   --   LABPROT 14.5  --   --   INR 1.14  --   --   HEPARINUNFRC  --   --  0.62  CREATININE 1.16* 1.04*  --     Estimated Creatinine Clearance: 42.7 mL/min (A) (by C-G formula based on SCr of 1.04 mg/dL (H)).   Medical History: Past Medical History:  Diagnosis Date  . Cancer [facility]) 2009   right breast cancer; mastectomy  . Cancer [facility]) 1998   left; lumpectomy  . DVT (deep venous thrombosis) ([place])   . Hemorrhoids, external   . HX: breast cancer   . Lower abdominal pain   . Occult blood in stools   . Seizures ([place])    one very slight seizure with the stroke; somewhere between 2005-2009  . Stroke [facility]) 2005?   "very light;" also had one small seizure with stroke   . UC (ulcerative colitis) ([place])     Medications:  Medications Prior to Admission  Medication Sig Dispense Refill Last Dose  . aspirin 81 MG tablet Take 81 mg by mouth 3 (three) times a week. Tues, Thurs, Sat only 30 tablet  03/06/2018 at Unknown time  . clobetasol (TEMOVATE) 0.05 % external solution Apply 1 application topically 2 (two) times daily as needed (per Dermatology).    unknown  . denosumab (PROLIA) 60 MG/ML SOLN injection Inject 60 mg into the skin every 6 (six) months. Administer in upper arm, thigh, or abdomen   09/08/2017  . HUMIRA PEN 40 MG/0.8ML PNKT INJECT 40MG SQ EVERY  14 DAYS 2 each 11 03/03/2018 at Unknown time  . Magnesium 500 MG TABS Take 500 mg by mouth daily.   03/08/2018 at Unknown time  . mesalamine (LIALDA) 1.2 g EC tablet Take 2 tablets by mouth 2 (two) times daily.   03/08/2018 at Unknown time  . triamterene-hydrochlorothiazide (MAXZIDE-25) 37.5-25 MG tablet Take 1 tablet by mouth every morning.    03/08/2018 at Unknown time    Assessment: Pharmacy consulted to dose apixaban in patient with DVT. Patient is currently on heparin IV and will be transitioned to apixaban today.  Goal of Therapy:  Monitor platelets by anticoagulation protocol: Yes   Plan:  Apixaban 10 mg BID x 7 days followed by Apixaban 5 mg BID Monitor labs and s/s of bleeding  Ramond Craver 03/09/2018,12:16 PM

## 2018-03-16 ENCOUNTER — Ambulatory Visit (INDEPENDENT_AMBULATORY_CARE_PROVIDER_SITE_OTHER): Payer: Medicare Other | Admitting: Internal Medicine

## 2018-03-16 ENCOUNTER — Encounter (INDEPENDENT_AMBULATORY_CARE_PROVIDER_SITE_OTHER): Payer: Self-pay | Admitting: Internal Medicine

## 2018-03-16 VITALS — BP 150/78 | HR 72 | Temp 98.0°F | Resp 18 | Ht 63.0 in | Wt 176.1 lb

## 2018-03-16 DIAGNOSIS — K5100 Ulcerative (chronic) pancolitis without complications: Secondary | ICD-10-CM | POA: Diagnosis not present

## 2018-03-16 NOTE — Patient Instructions (Signed)
Notify if you have rectal bleeding or diarrhea.

## 2018-03-16 NOTE — Progress Notes (Signed)
Presenting complaint;  Follow-up for ulcerative colitis. Recent diagnosis of right DVT.  Database and subjective:  Patient is 82 year old Caucasian female with over 50-year history of ulcerative colitis who is been maintained on Humira and oral mesalamine and has been in remission.  She was last seen on 09/15/2017 and was doing well.  Last week she was evaluated in the emergency room at [facility] for right leg swelling.  She was diagnosed with DVT.  Because of history of UC and prior episode of significant bleed patient requested to be transferred to [facility]. Patient was begun on Eliquis and she was discharged after brief hospitalization. She is not having any problems with anticoagulant.  She is having 1 formed stool daily.  She denies abdominal pain melena or rectal bleeding.  She has good appetite.  She has gained 3 pounds since she was discharged from the hospital.  She feels right leg swelling has not decreased any.  She has not noted any skin breakdown or blisters.  She denies shortness of breath.  She had IVC filter placed in November 2013 for acute GI bleed in the setting of anticoagulant and active ulcerative colitis.  At one point she thought about having it removed but after talking with radiologist she decided not to proceed. Patient also asked me to review her records From few weeks ago when she was seen in emergency room for sick syncopal episode and she was felt to have orthostatic syncope.  Current Medications: Outpatient Encounter Medications as of 03/16/2018  Medication Sig  . apixaban (ELIQUIS) 5 MG TABS tablet Take 2 tablets (10 mg total) by mouth 2 (two) times daily. Then 1 tablet (5 mg) two times daily starting 03/16/18  . clobetasol (TEMOVATE) 0.05 % external solution Apply 1 application topically 2 (two) times daily as needed (per Dermatology).   Marland Kitchen denosumab (PROLIA) 60 MG/ML SOLN injection Inject 60 mg into the skin every 6 (six) months. Administer in upper arm, thigh, or  abdomen  . HUMIRA PEN 40 MG/0.8ML PNKT INJECT 40MG SQ EVERY 14 DAYS  . Magnesium 500 MG TABS Take 500 mg by mouth daily.  . mesalamine (LIALDA) 1.2 g EC tablet Take 2 tablets by mouth 2 (two) times daily.  Marland Kitchen triamterene-hydrochlorothiazide (MAXZIDE-25) 37.5-25 MG tablet Take 1 tablet by mouth every morning.    No facility-administered encounter medications on file as of 03/16/2018.      Objective: Blood pressure (!) 150/78, pulse 72, temperature 98 F (36.7 C), temperature source Oral, resp. rate 18, height 5' 3"  (1.6 m), weight 176 lb 1.6 oz (79.9 kg). Patient is alert and in no acute distress. Conjunctiva is pink. Sclera is nonicteric Oropharyngeal mucosa is normal. No neck masses or thyromegaly noted. Cardiac exam with regular rhythm normal S1 and S2. No murmur or gallop noted. Lungs are clear to auscultation. Abdomen is full but soft and nontender with organomegaly or masses. 2-3+ edema to right leg below the level of knee.  She also has edema to her foot.  No ecchymosis or skin breakdown noted.  She has    Assessment:  #1.  Chronic ulcerative colitis.  She remains on Humira and oral mesalamine.  She has no symptoms to suggest relapse.  She is on anticoagulant and has not experienced bleeding which is reassuring. She will continue dual therapy for now.  #2.  DVT right leg.  She has been on Eliquis for 8 days and so far has not seen any improvement.  Luckily her edema is not getting  worse.  She may need to follow with vascular surgeon if edema persists.  She will discuss this with Dr. Melina Copa on her visit next week.   Plan:  Continue Humira at current dose of 40 mg every 2 weeks. Continue Lialda 2.4 g p.o. twice daily. Patient will call office if she has diarrhea or rectal bleeding. Office visit in 6 months.

## 2018-04-05 ENCOUNTER — Other Ambulatory Visit: Payer: Self-pay

## 2018-04-05 ENCOUNTER — Encounter: Payer: Self-pay | Admitting: Vascular Surgery

## 2018-04-05 ENCOUNTER — Ambulatory Visit: Payer: Medicare Other | Admitting: Vascular Surgery

## 2018-04-05 VITALS — BP 140/85 | HR 76 | Resp 18 | Ht 63.0 in | Wt 176.0 lb

## 2018-04-05 DIAGNOSIS — I82401 Acute embolism and thrombosis of unspecified deep veins of right lower extremity: Secondary | ICD-10-CM

## 2018-04-05 NOTE — Progress Notes (Signed)
Patient name: Monique Day MRN: [id number] DOB: 03/02/1936 Sex: female   REASON FOR CONSULT:    Right lower extremity DVT.  The consult is requested by Dr. Octavio Graves.  HPI:   Monique Day is a pleasant 82 y.o. female, who is referred with a right lower extremity DVT.  I have reviewed the records from the referring office.  The patient was seen on 03/25/2018 by Dr. Octavio Graves.  The patient had a 3-week history of right leg swelling.  The patient had a DVT diagnosed from the groin to the knee.  Patient was in the hospital overnight was started on Eliquis.  The patient had had previous problems and therefore vascular surgery was consulted.  Her past medical history is also significant for ulcerative colitis.  My history, the patient was dizzy and passed out on July 11.  She is not sure how long she was down.  She was admitted to the hospital and diagnosed with dehydration and orthostatic hypotension.  During that hospitalization she developed bilateral lower extremity swelling.  I believe she was only there in the hospital overnight.  She continued to have some bilateral lower extremity swelling.  However the swelling in the right leg worsened and a venous duplex scan was obtained which diagnosed a right lower extremity DVT.  She was started on Eliquis.  She had a previous DVT in the right lower extremity in the early 2000's according to the patient.  She does not remember any recent long travel or injury to the leg prior to her most recent DVT.  She is unaware of any hypercoagulable conditions.  She does have a history of ulcerative colitis and apparently had a bleeding issue when she was treated with anticoagulation for her original DVT back in the 2000's.  Past Medical History:  Diagnosis Date  . Adult idiopathic generalized osteoporosis   . Cancer [facility]) 2009   right breast cancer; mastectomy  . Cancer [facility]) 1998   left; lumpectomy  . DVT (deep venous thrombosis) ([place])     . DVT (deep venous thrombosis) ([place])   . Hemorrhoids, external   . HX: breast cancer   . Lower abdominal pain   . Occult blood in stools   . Peripheral edema   . Seizures ([place])    one very slight seizure with the stroke; somewhere between 2005-2009  . Stroke [facility]) 2005?   "very light;" also had one small seizure with stroke   . UC (ulcerative colitis) ([place])     Family History  Problem Relation Age of Onset  . Prostate cancer Father   . Colon cancer Neg Hx     SOCIAL HISTORY: Social History   Socioeconomic History  . Marital status: Widowed    Spouse name: Not on file  . Number of children: Not on file  . Years of education: Not on file  . Highest education level: Not on file  Occupational History  . Not on file  Social Needs  . Financial resource strain: Not on file  . Food insecurity:    Worry: Not on file    Inability: Not on file  . Transportation needs:    Medical: Not on file    Non-medical: Not on file  Tobacco Use  . Smoking status: Former Smoker    Types: Cigarettes    Last attempt to quit: 05/05/2001    Years since quitting: 16.9  . Smokeless tobacco: Never Used  Substance and Sexual Activity  .  Alcohol use: No    Alcohol/week: 0.0 standard drinks  . Drug use: No  . Sexual activity: Yes  Lifestyle  . Physical activity:    Days per week: Not on file    Minutes per session: Not on file  . Stress: Not on file  Relationships  . Social connections:    Talks on phone: Not on file    Gets together: Not on file    Attends religious service: Not on file    Active member of club or organization: Not on file    Attends meetings of clubs or organizations: Not on file    Relationship status: Not on file  . Intimate partner violence:    Fear of current or ex partner: Not on file    Emotionally abused: Not on file    Physically abused: Not on file    Forced sexual activity: Not on file  Other Topics Concern  . Not on file  Social History Narrative  .  Not on file    Allergies  Allergen Reactions  . Mercaptopurine Other (See Comments)    Dropped WBC  . Sulfa Antibiotics Other (See Comments)    Extreme Weakness    Current Outpatient Medications  Medication Sig Dispense Refill  . apixaban (ELIQUIS) 5 MG TABS tablet Take 2 tablets (10 mg total) by mouth 2 (two) times daily. Then 1 tablet (5 mg) two times daily starting 03/16/18 73 tablet 0  . denosumab (PROLIA) 60 MG/ML SOLN injection Inject 60 mg into the skin every 6 (six) months. Administer in upper arm, thigh, or abdomen    . HUMIRA PEN 40 MG/0.8ML PNKT INJECT 40MG SQ EVERY 14 DAYS 2 each 11  . Magnesium 500 MG TABS Take 500 mg by mouth daily.    . mesalamine (LIALDA) 1.2 g EC tablet Take 2 tablets by mouth 2 (two) times daily.    . clobetasol (TEMOVATE) 0.05 % external solution Apply 1 application topically 2 (two) times daily as needed (per Dermatology).     . triamterene-hydrochlorothiazide (MAXZIDE-25) 37.5-25 MG tablet Take 1 tablet by mouth every morning.      No current facility-administered medications for this visit.     REVIEW OF SYSTEMS:  [X]  denotes positive finding, [ ]  denotes negative finding Cardiac  Comments:  Chest pain or chest pressure:    Shortness of breath upon exertion:    Short of breath when lying flat:    Irregular heart rhythm:        Vascular    Pain in calf, thigh, or hip brought on by ambulation:    Pain in feet at night that wakes you up from your sleep:     Blood clot in your veins: x   Leg swelling:  x       Pulmonary    Oxygen at home:    Productive cough:     Wheezing:         Neurologic    Sudden weakness in arms or legs:     Sudden numbness in arms or legs:     Sudden onset of difficulty speaking or slurred speech:    Temporary loss of vision in one eye:     Problems with dizziness:  x       Gastrointestinal    Blood in stool:     Vomited blood:         Genitourinary    Burning when urinating:     Blood in urine:  Psychiatric    Major depression:         Hematologic    Bleeding problems:    Problems with blood clotting too easily:        Skin    Rashes or ulcers:        Constitutional    Fever or chills:     PHYSICAL EXAM:   Vitals:   04/05/18 1529 04/05/18 1530  BP: (!) 143/81 140/85  Pulse: 76   Resp: 18   SpO2: 95%   Weight: 176 lb (79.8 kg)   Height: 5' 3"  (1.6 m)     GENERAL: The patient is a well-nourished female, in no acute distress. The vital signs are documented above. CARDIAC: There is a regular rate and rhythm.  VASCULAR: I do not detect carotid bruits. She has palpable femoral, popliteal, and dorsalis pedis pulses bilaterally. She has mild bilateral lower extremity swelling. She has some mild hyperpigmentation bilaterally. PULMONARY: There is good air exchange bilaterally without wheezing or rales. ABDOMEN: Soft and non-tender with normal pitched bowel sounds.  MUSCULOSKELETAL: There are no major deformities or cyanosis. NEUROLOGIC: No focal weakness or paresthesias are detected. SKIN: There are no ulcers or rashes noted. PSYCHIATRIC: The patient has a normal affect.  DATA:    VENOUS DUPLEX: I have reviewed the venous duplex scan that was done on 03/08/2018 at [facility] care.  On that study, there was clot noted in the right common femoral vein.  In addition there was some clot in the saphenofemoral junction.  There was also clot in the femoral vein and popliteal vein on the right.  There was no clot in the calf.  The iliac veins were not assessed.  MEDICAL ISSUES:   RECURRENT RIGHT LOWER EXTREMITY DVT: This patient has had her second DVT of the right lower extremity and I think she was treated appropriately with Eliquis.  At this point, I do not think thrombin lysis is a consideration.  I have discussed with her the importance of intermittent leg elevation and the proper positioning for this as I think this will help prevent postphlebitic syndrome.  She also  has some compression stockings which I have encouraged her to wear when she is on her feet.  I have encouraged her to avoid prolonged sitting and standing and to exercise as much as possible including water aerobics which I think is very helpful for patients with venous disease.  I also encouraged her to stay well-hydrated.  We also discussed the importance of nutrition.  I think it would be reasonable to repeat her duplex scan in 6 months.  I think she should stay on Eliquis at least until that time.  Given that she has had a previous DVT certainly it would be reasonable to continue Eliquis indefinitely.  However, if a follow-up duplex scan in 6 months showed complete resolution of her clot then one consideration would be to stop her Eliquis and then have a hypercoagulable work-up performed by the hematologist.  If this was unremarkable consideration could be given to not restarting her Eliquis.  I think this is a reasonable consideration given her previous bleeding problem related to her ulcerative colitis in the past.  I will be happy to see her back at any time if any new issues arise.  Monique Day [facility] [phone]

## 2018-05-06 ENCOUNTER — Other Ambulatory Visit (INDEPENDENT_AMBULATORY_CARE_PROVIDER_SITE_OTHER): Payer: Self-pay | Admitting: Internal Medicine

## 2018-06-08 ENCOUNTER — Other Ambulatory Visit (INDEPENDENT_AMBULATORY_CARE_PROVIDER_SITE_OTHER): Payer: Self-pay | Admitting: Internal Medicine

## 2018-06-23 ENCOUNTER — Other Ambulatory Visit: Payer: Self-pay | Admitting: *Deleted

## 2018-06-23 DIAGNOSIS — N6320 Unspecified lump in the left breast, unspecified quadrant: Secondary | ICD-10-CM

## 2018-06-29 ENCOUNTER — Ambulatory Visit
Admission: RE | Admit: 2018-06-29 | Discharge: 2018-06-29 | Disposition: A | Discharge status: Home / Self Care | Payer: Medicare Other | Source: Ambulatory Visit | Attending: *Deleted | Admitting: *Deleted

## 2018-06-29 DIAGNOSIS — N6320 Unspecified lump in the left breast, unspecified quadrant: Secondary | ICD-10-CM

## 2018-07-19 ENCOUNTER — Ambulatory Visit: Payer: Self-pay | Admitting: Surgery

## 2018-07-19 NOTE — H&P (Signed)
Pascal Lux Documented: 07/19/2018 10:16 AM Location: [facility] Patient #: [id number] DOB: 04-30-1936 Widowed / Language: Cleophus Molt / Race: White Female  History of Present Illness Marcello Moores A. Jacinda Kanady MD; 07/19/2018 11:35 AM) Patient words: Patient sent at the request of Dr. Octavio Graves for a mammographic abnormality detected recently on screening mammogram. A 7 mm lesion was identified left breast upper outer quadrant. 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 She has a history of right mastectomy for breast cancer over 20 years ago. She has a history of left breast lumpectomy 10 years ago for stage I breast cancer followed by radiation therapy. The patient is sort the biopsy site. She had no symptoms prior to her mammogram. Patient denies any mass, nipple discharge or discomfort.          ADDITIONAL INFORMATION: PROGNOSTIC INDICATORS Results: IMMUNOHISTOCHEMICAL AND MORPHOMETRIC ANALYSIS PERFORMED MANUALLY The tumor cells are NEGATIVE for Her2 (1+). Estrogen Receptor: 100%, POSITIVE, STRONG STAINING INTENSITY Progesterone Receptor: 0%, NEGATIVE Proliferation Marker Ki67: 10% COMMENT: The negative hormone receptor study(ies) in this case has An internal positive control. REFERENCE RANGE ESTROGEN RECEPTOR NEGATIVE 0% POSITIVE =>1% REFERENCE RANGE PROGESTERONE RECEPTOR NEGATIVE 0% POSITIVE =>1% All controls stained appropriately Enid Cutter MD Pathologist, Electronic Signature ( Signed 07/02/2018) FINAL DIAGNOSIS Diagnosis Breast, left, needle core biopsy, upper inner - INVASIVE DUCTAL CARCINOMA. 1 of 3 FINAL for TERSEA, AULDS ([id number]) Microscopic Comment The carcinoma appears grade I. Immunohistochemistry for P63, Calponin and SMM-1 demonstrates the absence of myoepithelium throughout. A breast prognostic profile will be performed and reported separately. The results were reported to  [facility] on 06/30/2018. Intradepartmental consultation was obtained (Dr. Tresa Moore). Gillie Manners MD Pathologist, Electronic Signature (Case signed 07/01/2018) Specimen Gross and Clinical Information Specimen Comment In formalin 8:40, extracted M 62 minute; 82 year old female with irregular mass in UI left breast; recent DX of left breast IMC at [facility]; HX of B breast cancers with R mastectomy and L lumpectomy Specimen(s) Obtained: Breast, left,.  The patient is a 82 year old female.   Past Surgical History Emeline Gins, CMA; 07/19/2018 10:17 AM) Appendectomy Breast Biopsy multiple Breast Mass; Local Excision Left. Cataract Surgery Bilateral. Mastectomy Right. Oral Surgery Resection of Small Bowel  Diagnostic Studies History Emeline Gins, [place]; 07/19/2018 10:17 AM) Colonoscopy 1-5 years ago Mammogram within last year Pap Smear >5 years ago  Allergies Emeline Gins, [place]; 07/19/2018 10:20 AM) Sulfa Antibiotics Allergies Reconciled  Medication History Emeline Gins, CMA; 07/19/2018 10:20 AM) Humira Pen (40MG/0.8ML Pen-inj Kit, Subcutaneous) Active. Mesalamine (1.2GM Tablet DR, Oral) Active. Prolia (60MG/ML Soln Pref Syr, Subcutaneous) Active. Eliquis (5MG Tablet, Oral) Active. Medications Reconciled  Social History Emeline Gins, [place]; 07/19/2018 10:17 AM) Alcohol use Occasional alcohol use. Caffeine use Coffee, Tea. No drug use Tobacco use Former smoker.  Family History Emeline Gins, [place]; 07/19/2018 10:17 AM) Arthritis Father. Hypertension Father.  Pregnancy / Birth History Emeline Gins, [place]; 07/19/2018 10:17 AM) Age at menarche 61 years. Age of menopause 45-55 Gravida 0 Irregular periods Para 0  Other Problems Emeline Gins, [place]; 07/19/2018 10:17 AM) Breast Cancer Cerebrovascular Accident Lump In Breast Pulmonary Embolism / Blood Clot in Legs Ulcerative  Colitis     Review of Systems Emeline Gins CMA; 07/19/2018 10:17 AM) General Not Present- Appetite Loss, Chills, Fatigue, Fever, Night Sweats, Weight Gain and Weight Loss. Skin Present- Dryness. Not Present- Change in Wart/Mole, Hives, Jaundice, New Lesions, Non-Healing Wounds, Rash and Ulcer. Respiratory  Present- Snoring. Not Present- Bloody sputum, Chronic Cough, Difficulty Breathing and Wheezing. Breast Present- Breast Mass. Not Present- Breast Pain, Nipple Discharge and Skin Changes. Cardiovascular Present- Leg Cramps. Not Present- Chest Pain, Difficulty Breathing Lying Down, Palpitations, Rapid Heart Rate, Shortness of Breath and Swelling of Extremities. Gastrointestinal Not Present- Abdominal Pain, Bloating, Bloody Stool, Change in Bowel Habits, Chronic diarrhea, Constipation, Difficulty Swallowing, Excessive gas, Gets full quickly at meals, Hemorrhoids, Indigestion, Nausea, Rectal Pain and Vomiting. Female Genitourinary Not Present- Frequency, Nocturia, Painful Urination, Pelvic Pain and Urgency. Musculoskeletal Not Present- Back Pain, Joint Pain, Joint Stiffness, Muscle Pain, Muscle Weakness and Swelling of Extremities. Neurological Not Present- Decreased Memory, Fainting, Headaches, Numbness, Seizures, Tingling, Tremor, Trouble walking and Weakness. Psychiatric Not Present- Anxiety, Bipolar, Change in Sleep Pattern, Depression, Fearful and Frequent crying. Endocrine Not Present- Cold Intolerance, Excessive Hunger, Hair Changes, Heat Intolerance, Hot flashes and New Diabetes. Hematology Present- Blood Thinners. Not Present- Easy Bruising, Excessive bleeding, Gland problems, HIV and Persistent Infections.  Vitals Emeline Gins CMA; 07/19/2018 10:18 AM) 07/19/2018 10:17 AM Weight: 176.5 lb Height: 63in Body Surface Area: 1.83 m Body Mass Index: 31.27 kg/m  Temp.: 97.16F  Pulse: 102 (Regular)  BP: 140/84 (Sitting, Left Arm, Standard)      Physical Exam (Allsion Nogales A.  Seamus Warehime MD; 07/19/2018 11:36 AM)  General Mental Status-Alert. General Appearance-Consistent with stated age. Hydration-Well hydrated. Voice-Normal.  Head and Neck Head-normocephalic, atraumatic with no lesions or palpable masses. Trachea-midline. Thyroid Gland Characteristics - normal size and consistency.  Chest and Lung Exam Chest and lung exam reveals -quiet, even and easy respiratory effort with no use of accessory muscles and on auscultation, normal breath sounds, no adventitious sounds and normal vocal resonance. Inspection Chest Wall - Normal. Back - normal.  Breast Note: Right breast surgically absent. Left breast shows lumpectomy scar. Post radiation markings noted. Mild swelling of biopsy site.  Cardiovascular Cardiovascular examination reveals -normal heart sounds, regular rate and rhythm with no murmurs and normal pedal pulses bilaterally.  Neurologic Neurologic evaluation reveals -alert and oriented x 3 with no impairment of recent or remote memory. Mental Status-Normal.  Lymphatic Head & Neck  General Head & Neck Lymphatics: Bilateral - Description - Normal. Axillary  General Axillary Region: Bilateral - Description - Normal. Tenderness - Non Tender.    Assessment & Plan (Doil Kamara A. Javonte Elenes MD; 07/19/2018 11:36 AM)  BREAST CANCER, LEFT (C50.912) Impression: Stage I  Discussed left simple mastectomy since she's had previous radiation therapy. The patient is in agreement. She has no interest in reconstruction. Discussed treatment options for breast cancer to include breast conservation vs mastectomy with reconstruction. Pt has decided on mastectomy. Risk include bleeding, infection, flap necrosis, pain, numbness, recurrence, hematoma, other surgery needs. Pt understands and agrees to proceed.  Current Plans You are being scheduled for surgery- Our schedulers will call you.  You should hear from our office's scheduling department within  5 working days about the location, date, and time of surgery. We try to make accommodations for patient's preferences in scheduling surgery, but sometimes the OR schedule or the surgeon's schedule prevents [place] from making those accommodations.  If you have not heard from our office [phone]) in 5 working days, call the office and ask for your surgeon's nurse.  If you have other questions about your diagnosis, plan, or surgery, call the office and ask for your surgeon's nurse.  Pt Education - [facility] Breast Cancer Information Given - [facility] "Breast Journey" Package We discussed the staging and pathophysiology of breast  cancer. We discussed all of the different options for treatment for breast cancer including surgery, chemotherapy, radiation therapy, Herceptin, and antiestrogen therapy. We discussed a sentinel lymph node biopsy as she does not appear to having lymph node involvement right now. We discussed the performance of that with injection of radioactive tracer and blue dye. We discussed that she would have an incision underneath her axillary hairline. We discussed that there is a bout a 10-20% chance of having a positive node with a sentinel lymph node biopsy and we will await the permanent pathology to make any other first further decisions in terms of her treatment. One of these options might be to return to the operating room to perform an axillary lymph node dissection. We discussed about a 1-2% risk lifetime of chronic shoulder pain as well as lymphedema associated with a sentinel lymph node biopsy. We discussed the options for treatment of the breast cancer which included lumpectomy versus a mastectomy. We discussed the performance of the lumpectomy with a wire placement. We discussed a 10-20% chance of a positive margin requiring reexcision in the operating room. We also discussed that she may need radiation therapy or antiestrogen therapy or both if she undergoes lumpectomy. We discussed the  mastectomy and the postoperative care for that as well. We discussed that there is no difference in her survival whether she undergoes lumpectomy with radiation therapy or antiestrogen therapy versus a mastectomy. There is a slight difference in the local recurrence rate being 3-5% with lumpectomy and about 1% with a mastectomy. We discussed the risks of operation including bleeding, infection, possible reoperation. She understands her further therapy will be based on what her stages at the time of her operation.  Pt Education - flb breast cancer surgery: discussed with patient and provided information. Pt Education - [facility] Mastectomy HCI Pt Education - ABC (After Breast Cancer) Class Info: discussed with patient and provided information.

## 2018-07-21 ENCOUNTER — Inpatient Hospital Stay (HOSPITAL_COMMUNITY): Payer: Medicare Other | Attending: Hematology | Admitting: Hematology

## 2018-07-21 ENCOUNTER — Other Ambulatory Visit: Payer: Self-pay

## 2018-07-21 ENCOUNTER — Encounter (HOSPITAL_COMMUNITY): Payer: Self-pay | Admitting: Hematology

## 2018-07-21 DIAGNOSIS — Z79811 Long term (current) use of aromatase inhibitors: Secondary | ICD-10-CM | POA: Diagnosis not present

## 2018-07-21 DIAGNOSIS — Z9011 Acquired absence of right breast and nipple: Secondary | ICD-10-CM | POA: Insufficient documentation

## 2018-07-21 DIAGNOSIS — Z87891 Personal history of nicotine dependence: Secondary | ICD-10-CM | POA: Diagnosis not present

## 2018-07-21 DIAGNOSIS — Z170 Estrogen receptor positive status [ER+]: Secondary | ICD-10-CM | POA: Insufficient documentation

## 2018-07-21 DIAGNOSIS — M818 Other osteoporosis without current pathological fracture: Secondary | ICD-10-CM

## 2018-07-21 DIAGNOSIS — K5190 Ulcerative colitis, unspecified, without complications: Secondary | ICD-10-CM | POA: Diagnosis not present

## 2018-07-21 DIAGNOSIS — Z923 Personal history of irradiation: Secondary | ICD-10-CM | POA: Diagnosis not present

## 2018-07-21 DIAGNOSIS — Z9221 Personal history of antineoplastic chemotherapy: Secondary | ICD-10-CM | POA: Diagnosis not present

## 2018-07-21 DIAGNOSIS — Z79899 Other long term (current) drug therapy: Secondary | ICD-10-CM | POA: Insufficient documentation

## 2018-07-21 DIAGNOSIS — C50212 Malignant neoplasm of upper-inner quadrant of left female breast: Secondary | ICD-10-CM | POA: Insufficient documentation

## 2018-07-21 DIAGNOSIS — Z86718 Personal history of other venous thrombosis and embolism: Secondary | ICD-10-CM | POA: Diagnosis not present

## 2018-07-21 MED ORDER — ANASTROZOLE 1 MG PO TABS: 1.0000 mg | ORAL_TABLET | Freq: Every day | ORAL | 3 refills | Status: DC

## 2018-07-21 NOTE — Patient Instructions (Signed)
[facility] at [facility] Discharge Instructions     Thank you for choosing [facility] at [facility] to provide your oncology and hematology care.  To afford each patient quality time with our provider, please arrive at least 15 minutes before your scheduled appointment time.   If you have a lab appointment with the Cancer Center please come in thru the  Main Entrance and check in at the main information desk  You need to re-schedule your appointment should you arrive 10 or more minutes late.  We strive to give you quality time with our providers, and arriving late affects you and other patients whose appointments are after yours.  Also, if you no show three or more times for appointments you may be dismissed from the clinic at the providers discretion.     Again, thank you for choosing [facility].  Our hope is that these requests will decrease the amount of time that you wait before being seen by our physicians.       _____________________________________________________________  Should you have questions after your visit to [facility], please contact our office at [phone] between the hours of 8:00 a.m. and 4:30 p.m.  Voicemails left after 4:00 p.m. will not be returned until the following business day.  For prescription refill requests, have your pharmacy contact our office and allow 72 hours.    Cancer Center Support Programs:   > Cancer Support Group  2nd Tuesday of the month 1pm-2pm, Journey Room    

## 2018-07-21 NOTE — Progress Notes (Signed)
[place], [place] [address]   CLINIC:  Medical Oncology/Hematology  PCP:  Octavio Graves, DO [address] [place] [address] [phone]   REASON FOR VISIT: Newly diagnosed left breast cancer, ER+/PR-/HER2-  CURRENT THERAPY: mastectomy scheduled for 08/19/18, will start Arimidex back 07/22/18   History of present illness:  [name] 82 y.o. female is seen in consultation today for newly diagnosed left breast cancer.  She had a recent abnormal screening mammogram of the left breast done at [facility].  She had a unsuccessful ultrasound-guided biopsy.  She was then referred to [facility] for a stereotactic biopsy which was done on 06/29/2018 of the left breast irregular mass within the upper inner quadrant.  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  She was evaluated by Dr. Brantley Stage at [place] and is scheduled to have mastectomy done on 08/19/2018.  She had a prior history of left breast cancer in 1998, underwent lumpectomy, chemotherapy and radiation followed by 5 years of antiestrogen therapy.  She had a early stage right breast cancer in 2009 and underwent right mastectomy with followed by no adjuvant therapy. 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 She never had children of her own. She started menarche at the age of 78 and went through menopause at age 83. She lives at home alone and performs all her own ADLs. She cooks cleans, drives a car, and handles her own finances. She does have a step daughter that lives nearby and checks on her often. She reports she does get much exercise but moves around the house all day. She does not take calcium or Vit D but she understands the importance and will start taking this.  Family history: Her mother had cancer of unknown type, her father had prostate cancer. She had 4 paternal cousins to have breast cancer.   REVIEW OF SYSTEMS:  Review of Systems    Neurological: Positive for numbness.  All other systems reviewed and are negative.    PAST MEDICAL/SURGICAL HISTORY:  Past Medical History:  Diagnosis Date  . Adult idiopathic generalized osteoporosis   . Breast cancer ([place])   . Cancer [facility]) 2009   right breast cancer; mastectomy  . Cancer [facility]) 1998   left; lumpectomy  . DVT (deep venous thrombosis) ([place])   . DVT (deep venous thrombosis) ([place])   . Hemorrhoids, external   . HX: breast cancer   . Lower abdominal pain   . Occult blood in stools   . Peripheral edema   . Seizures ([place])    one very slight seizure with the stroke; somewhere between 2005-2009  . Stroke [facility]) 2005?   "very light;" also had one small seizure with stroke   . UC (ulcerative colitis) [facility])    Past Surgical History:  Procedure Laterality Date  . bone density  12/11/10  . BREAST LUMPECTOMY Left 1998  . COLONOSCOPY  02/21/2011  . COLONOSCOPY  10/06/2008  . COLONOSCOPY  12/27/01  . COLONOSCOPY  05/09/05  . COLONOSCOPY N/A 07/02/2016   Procedure: COLONOSCOPY;  Surgeon: [name], MD;  Location: [facility] ENDO SUITE;  Service: Endoscopy;  Laterality: N/A;  1055  . EXPLORATORY LAPAROTOMY    . Left Eye     Caturact  . MASTECTOMY Right 2009  . Right eye     Cataract     SOCIAL HISTORY:  Social History   Socioeconomic History  . Marital status:  Widowed    Spouse name: Not on file  . Number of children: 0  . Years of education: Not on file  . Highest education level: Not on file  Occupational History  . Occupation: Optometrist: [employer]  Social Needs  . Financial resource strain: Not hard at all  . Food insecurity:    Worry: Never true    Inability: Never true  . Transportation needs:    Medical: No    Non-medical: No  Tobacco Use  . Smoking status: Former Smoker    Packs/day: 2.00    Years: 40.00    Pack years: 80.00    Types: Cigarettes    Last attempt to quit: 05/05/2001    Years since quitting:  17.2  . Smokeless tobacco: Never Used  Substance and Sexual Activity  . Alcohol use: No    Alcohol/week: 0.0 standard drinks  . Drug use: No  . Sexual activity: Not Currently  Lifestyle  . Physical activity:    Days per week: 0 days    Minutes per session: 0 min  . Stress: Not at all  Relationships  . Social connections:    Talks on phone: More than three times a week    Gets together: More than three times a week    Attends religious service: More than 4 times per year    Active member of club or organization: Yes    Attends meetings of clubs or organizations: More than 4 times per year    Relationship status: Widowed  . Intimate partner violence:    Fear of current or ex partner: No    Emotionally abused: No    Physically abused: No    Forced sexual activity: No  Other Topics Concern  . Not on file  Social History Narrative  . Not on file    FAMILY HISTORY:  Family History  Problem Relation Age of Onset  . Prostate cancer Father   . Colon cancer Neg Hx     CURRENT MEDICATIONS:  Outpatient Encounter Medications as of 07/21/2018  Medication Sig Note  . apixaban (ELIQUIS) 5 MG TABS tablet Take 2 tablets (10 mg total) by mouth 2 (two) times daily. Then 1 tablet (5 mg) two times daily starting 03/16/18 03/16/2018: Starting today the patient is taking 5 mg in the morning and 5 mg at night  . denosumab (PROLIA) 60 MG/ML SOLN injection Inject 60 mg into the skin every 6 (six) months. Administer in upper arm, thigh, or abdomen 03/08/2018: Received on 09/08/2017  . HUMIRA PEN 40 MG/0.8ML PNKT INJECT 40 MG SUBQ EVERY 14 DAYS.   . Magnesium 500 MG TABS Take 500 mg by mouth daily.   . mesalamine (LIALDA) 1.2 g EC tablet TAKE 2 TABLETS BY MOUTH TWICE DAILY.   Marland Kitchen anastrozole (ARIMIDEX) 1 MG tablet Take 1 tablet (1 mg total) by mouth daily.   . [DISCONTINUED] clobetasol (TEMOVATE) 0.05 % external solution Apply 1 application topically 2 (two) times daily as needed (per Dermatology).      . [DISCONTINUED] triamterene-hydrochlorothiazide (MAXZIDE-25) 37.5-25 MG tablet Take 1 tablet by mouth every morning.     No facility-administered encounter medications on file as of 07/21/2018.     ALLERGIES:  Allergies  Allergen Reactions  . Mercaptopurine Other (See Comments)    Dropped WBC  . Sulfa Antibiotics Other (See Comments)    Extreme Weakness     PHYSICAL EXAM:  ECOG Performance status: 1  Vitals:  07/21/18 1353  BP: (!) 148/66  Pulse: 82  Resp: 18  Temp: 98.6 F (37 C)  SpO2: 99%   Filed Weights   07/21/18 1353  Weight: 178 lb 3.2 oz (80.8 kg)    Physical Exam  Constitutional: She is oriented to person, place, and time. She appears well-developed and well-nourished.  Neck: Normal range of motion. Neck supple.  Cardiovascular: Normal rate, regular rhythm and normal heart sounds.  Pulmonary/Chest: Effort normal and breath sounds normal.  Abdominal: Soft.  Musculoskeletal: Normal range of motion.  Neurological: She is alert and oriented to person, place, and time.  Skin: Skin is warm and dry.  Psychiatric: She has a normal mood and affect. Her behavior is normal. Judgment and thought content normal.  Breast Left: No palpable masses, no skin changes or nipple discharge, no adenopathy. Healed biopsy site no bruising or swelling noted. Right mastectomy site: No palpable masses, no skin changes, or no adenopathy. Abdomen: No palpable hepatosplenomegaly.  Nontender to exam. Extremities: 1+ edema bilaterally.  LABORATORY DATA:  I have reviewed the labs as listed.  CBC    Component Value Date/Time   WBC 10.6 (H) 03/08/2018 1840   RBC 4.00 03/08/2018 1840   HGB 12.4 03/08/2018 1840   HGB 14.4 03/10/2017 1254   HCT 37.7 03/08/2018 1840   HCT 41.5 03/10/2017 1254   PLT 273 03/08/2018 1840   PLT 249 03/10/2017 1254   MCV 94.3 03/08/2018 1840   MCV 91 03/10/2017 1254   MCH 31.0 03/08/2018 1840   MCHC 32.9 03/08/2018 1840   RDW 12.6 03/08/2018 1840    RDW 13.3 03/10/2017 1254   LYMPHSABS 2.6 03/08/2018 1840   LYMPHSABS 2.7 03/10/2017 1254   MONOABS 1.1 (H) 03/08/2018 1840   EOSABS 0.2 03/08/2018 1840   EOSABS 0.4 03/10/2017 1254   BASOSABS 0.0 03/08/2018 1840   BASOSABS 0.1 03/10/2017 1254   CMP Latest Ref Rng & Units 03/09/2018 03/08/2018 03/10/2017  Glucose 70 - 99 mg/dL 111(H) 103(H) 150(H)  BUN 8 - 23 mg/dL 23 24(H) 19  Creatinine 0.44 - 1.00 mg/dL 1.04(H) 1.16(H) 0.88  Sodium 135 - 145 mmol/L 135 133(L) 141  Potassium 3.5 - 5.1 mmol/L 3.6 3.8 4.4  Chloride 98 - 111 mmol/L 100 96(L) 99  CO2 22 - 32 mmol/L _0 Calcium 8.9 - 10.3 mg/dL 9.0 9.0 9.1  Total Protein 6.5 - 8.1 g/dL - 8.0 7.1  Total Bilirubin 0.3 - 1.2 mg/dL - 0.6 0.3  Alkaline Phos 38 - 126 U/L - 35(L) 34(L)  AST 15 - 41 U/L - 14(L) 18  ALT 0 - 44 U/L - 11 12       DIAGNOSTIC IMAGING:  I have independently reviewed the scans and discussed with the patient.  I have reviewed Francene Finders, NP's note and agree with the documentation.  I personally performed a face-to-face visit, made revisions and my assessment and plan is as follows.     ASSESSMENT & PLAN:   Breast cancer of upper-inner quadrant of left female breast ([place]) 1.  Left breast cancer: - Patient reportedly had an abnormal mammogram done at [facility], followed by attempted ultrasound-guided biopsy which was unsuccessful. - She was referred to [facility] when she had a stereotactic biopsy on 06/29/2018, of the left breast irregular mass within the posterior upper, slightly inner quadrant.  A coil-shaped clip was placed. 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  Ki 67 was 10%. - Patient  had a history of left breast cancer in 1998, status post lumpectomy, followed by chemotherapy and radiation, 1 year of tamoxifen which was switched to anastrozole for subsequent 4 years due to intolerance. - She had a history of right breast cancer in 2009, status  post mastectomy.  She did not receive any adjuvant chemotherapy or radiation. - She was evaluated by Dr. Brantley Stage at [facility] in [place].  She has a surgery scheduled on 08/19/2018. - I have talked to her about starting her on anastrozole 1 mg daily as neoadjuvant therapy until surgery.  We talked about side effects in detail.  She is agreeable to this plan.  We will send a prescription to her pharmacy. -We will see her back 4 to 6 weeks after her mastectomy.  2.  Osteoporosis:  - She reportedly had a recent DEXA scan at [facility].  Will obtain the results of it. - She reports that she has been on Prolia every 6 months.  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  She has been on steroids before for her ulcerative colitis. -I have recommended her to take calcium and vitamin D twice daily.  3.  Recurrent DVT: - Right leg femoral and popliteal DVT, diagnosed on 03/08/2018.  She is currently on Eliquis. - She reported having leg swellings and decreased mobility prior to most recent DVT. - She had a history of DVT in 2000's, was treated on Coumadin until 5 years ago.  This was stopped secondary to a bleed complicated by her ulcerative colitis. - She was on a one-week bus trip to [place] when she first developed her blood clot.  4.  Ulcerative colitis: -She is currently on mesalamine and Humira.  Is well controlled.      Orders placed this encounter:  No orders of the defined types were placed in this encounter.     Derek Jack, MD [place] [phone]

## 2018-07-21 NOTE — Assessment & Plan Note (Addendum)
1.  Left breast cancer: - Patient reportedly had an abnormal mammogram done at [facility], followed by attempted ultrasound-guided biopsy which was unsuccessful. - She was referred to [facility] when she had a stereotactic biopsy on 06/29/2018, of the left breast irregular mass within the posterior upper, slightly inner quadrant.  A coil-shaped clip was placed. 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  Ki 67 was 10%. - Patient had a history of left breast cancer in 1998, status post lumpectomy, followed by chemotherapy and radiation, 1 year of tamoxifen which was switched to anastrozole for subsequent 4 years due to intolerance. - She had a history of right breast cancer in 2009, status post mastectomy.  She did not receive any adjuvant chemotherapy or radiation. - She was evaluated by Dr. Brantley Stage at [facility] in [place].  She has a surgery scheduled on 08/19/2018. - I have talked to her about starting her on anastrozole 1 mg daily as neoadjuvant therapy until surgery.  We talked about side effects in detail.  She is agreeable to this plan.  We will send a prescription to her pharmacy. -We will see her back 4 to 6 weeks after her mastectomy.  2.  Osteoporosis:  - She reportedly had a recent DEXA scan at [facility].  Will obtain the results of it. - She reports that she has been on Prolia every 6 months.  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  She has been on steroids before for her ulcerative colitis. -I have recommended her to take calcium and vitamin D twice daily.  3.  Recurrent DVT: - Right leg femoral and popliteal DVT, diagnosed on 03/08/2018.  She is currently on Eliquis. - She reported having leg swellings and decreased mobility prior to most recent DVT. - She had a history of DVT in 2000's, was treated on Coumadin until 5 years ago.  This was stopped secondary to a bleed complicated by her ulcerative colitis. - She was on a one-week  bus trip to [place] when she first developed her blood clot.  4.  Ulcerative colitis: -She is currently on mesalamine and Humira.  Is well controlled.

## 2018-08-03 ENCOUNTER — Other Ambulatory Visit (INDEPENDENT_AMBULATORY_CARE_PROVIDER_SITE_OTHER): Payer: Self-pay | Admitting: Internal Medicine

## 2018-08-04 ENCOUNTER — Other Ambulatory Visit (INDEPENDENT_AMBULATORY_CARE_PROVIDER_SITE_OTHER): Payer: Self-pay | Admitting: *Deleted

## 2018-08-04 MED ORDER — MESALAMINE ER 0.375 G PO CP24: ORAL_CAPSULE | ORAL | 3 refills | Status: DC

## 2018-08-13 NOTE — Pre-Procedure Instructions (Signed)
Zelda Reames  08/13/2018      [facility] - [place], [place] - [address] [address] Phone: [phone] Fax: [phone]    Your procedure is scheduled on January 2  Report to [facility] Admitting at 0930 A.M.  Call this number if you have problems the morning of surgery:  [phone]   Remember:  Do not eat or drink after midnight.      Take these medicines the morning of surgery with A SIP OF WATER  acetaminophen (TYLENOL)  Eye drops if needed  7 days prior to surgery STOP taking any Aspirin (unless otherwise instructed by your surgeon), Aleve, Naproxen, Ibuprofen, Motrin, Advil, Goody's, BC's, all herbal medications, fish oil, and all vitamins.  FOLLOW PHYSICIANS INSTRUCTIONS ABOUT ELIQUIS    Do not wear jewelry, make-up or nail polish.  Do not wear lotions, powders, or perfumes, or deodorant.  Do not shave 48 hours prior to surgery.    Do not bring valuables to the hospital.  [facility] is not responsible for any belongings or valuables.  Contacts, dentures or bridgework may not be worn into surgery.  Leave your suitcase in the car.  After surgery it may be brought to your room.  For patients admitted to the hospital, discharge time will be determined by your treatment team.  Patients discharged the day of surgery will not be allowed to drive home.    Special instructions:   [place]- Preparing For Surgery  Before surgery, you can play an important role. Because skin is not sterile, your skin needs to be as free of germs as possible. You can reduce the number of germs on your skin by washing with CHG (chlorahexidine gluconate) Soap before surgery.  CHG is an antiseptic cleaner which kills germs and bonds with the skin to continue killing germs even after washing.    Oral Hygiene is also important to reduce your risk of infection.  Remember - BRUSH YOUR TEETH THE MORNING OF SURGERY WITH YOUR REGULAR  TOOTHPASTE  Please do not use if you have an allergy to CHG or antibacterial soaps. If your skin becomes reddened/irritated stop using the CHG.  Do not shave (including legs and underarms) for at least 48 hours prior to first CHG shower. It is OK to shave your face.  Please follow these instructions carefully.   1. Shower the NIGHT BEFORE SURGERY and the MORNING OF SURGERY with CHG.   2. If you chose to wash your hair, wash your hair first as usual with your normal shampoo.  3. After you shampoo, rinse your hair and body thoroughly to remove the shampoo.  4. Use CHG as you would any other liquid soap. You can apply CHG directly to the skin and wash gently with a scrungie or a clean washcloth.   5. Apply the CHG Soap to your body ONLY FROM THE NECK DOWN.  Do not use on open wounds or open sores. Avoid contact with your eyes, ears, mouth and genitals (private parts). Wash Face and genitals (private parts)  with your normal soap.  6. Wash thoroughly, paying special attention to the area where your surgery will be performed.  7. Thoroughly rinse your body with warm water from the neck down.  8. DO NOT shower/wash with your normal soap after using and rinsing off the CHG Soap.  9. Pat yourself dry with a CLEAN TOWEL.  10. Wear CLEAN PAJAMAS  to bed the night before surgery, wear comfortable clothes the morning of surgery  11. Place CLEAN SHEETS on your bed the night of your first shower and DO NOT SLEEP WITH PETS.    Day of Surgery:  Do not apply any deodorants/lotions.  Please wear clean clothes to the hospital/surgery center.   Remember to brush your teeth WITH YOUR REGULAR TOOTHPASTE.    Please read over the following fact sheets that you were given.

## 2018-08-16 ENCOUNTER — Encounter (HOSPITAL_COMMUNITY)
Admission: RE | Admit: 2018-08-16 | Discharge: 2018-08-16 | Disposition: A | Discharge status: Home / Self Care | Payer: Medicare Other | Source: Ambulatory Visit | Attending: Surgery | Admitting: Surgery

## 2018-08-16 ENCOUNTER — Encounter (HOSPITAL_COMMUNITY): Payer: Self-pay

## 2018-08-16 ENCOUNTER — Other Ambulatory Visit: Payer: Self-pay

## 2018-08-16 DIAGNOSIS — C50912 Malignant neoplasm of unspecified site of left female breast: Secondary | ICD-10-CM | POA: Insufficient documentation

## 2018-08-16 DIAGNOSIS — N926 Irregular menstruation, unspecified: Secondary | ICD-10-CM | POA: Diagnosis not present

## 2018-08-16 DIAGNOSIS — Z923 Personal history of irradiation: Secondary | ICD-10-CM | POA: Diagnosis not present

## 2018-08-16 DIAGNOSIS — Z882 Allergy status to sulfonamides status: Secondary | ICD-10-CM | POA: Diagnosis not present

## 2018-08-16 DIAGNOSIS — Z01812 Encounter for preprocedural laboratory examination: Secondary | ICD-10-CM | POA: Insufficient documentation

## 2018-08-16 DIAGNOSIS — K5190 Ulcerative colitis, unspecified, without complications: Secondary | ICD-10-CM

## 2018-08-16 DIAGNOSIS — Z79899 Other long term (current) drug therapy: Secondary | ICD-10-CM | POA: Diagnosis not present

## 2018-08-16 DIAGNOSIS — I82401 Acute embolism and thrombosis of unspecified deep veins of right lower extremity: Secondary | ICD-10-CM | POA: Insufficient documentation

## 2018-08-16 DIAGNOSIS — Z9011 Acquired absence of right breast and nipple: Secondary | ICD-10-CM | POA: Diagnosis not present

## 2018-08-16 DIAGNOSIS — M810 Age-related osteoporosis without current pathological fracture: Secondary | ICD-10-CM

## 2018-08-16 DIAGNOSIS — I10 Essential (primary) hypertension: Secondary | ICD-10-CM | POA: Diagnosis not present

## 2018-08-16 DIAGNOSIS — Z87891 Personal history of nicotine dependence: Secondary | ICD-10-CM | POA: Diagnosis not present

## 2018-08-16 HISTORY — DX: Unspecified osteoarthritis, unspecified site: M19.90

## 2018-08-16 LAB — CBC WITH DIFFERENTIAL/PLATELET
Abs Immature Granulocytes: 0 10*3/uL (ref 0.00–0.07)
Basophils Absolute: 0 10*3/uL (ref 0.0–0.1)
Basophils Relative: 1 %
Eosinophils Absolute: 0.3 10*3/uL (ref 0.0–0.5)
Eosinophils Relative: 4 %
HCT: 45 % (ref 36.0–46.0)
Hemoglobin: 14.5 g/dL (ref 12.0–15.0)
Immature Granulocytes: 0 %
Lymphocytes Relative: 33 %
Lymphs Abs: 2.7 10*3/uL (ref 0.7–4.0)
MCH: 31.3 pg (ref 26.0–34.0)
MCHC: 32.2 g/dL (ref 30.0–36.0)
MCV: 97 fL (ref 80.0–100.0)
Monocytes Absolute: 0.7 10*3/uL (ref 0.1–1.0)
Monocytes Relative: 8 %
Neutro Abs: 4.4 10*3/uL (ref 1.7–7.7)
Neutrophils Relative %: 54 %
Platelets: 219 10*3/uL (ref 150–400)
RBC: 4.64 MIL/uL (ref 3.87–5.11)
RDW: 12.4 % (ref 11.5–15.5)
WBC: 8.1 10*3/uL (ref 4.0–10.5)
nRBC: 0 % (ref 0.0–0.2)

## 2018-08-16 LAB — COMPREHENSIVE METABOLIC PANEL
ALT: 15 U/L (ref 0–44)
AST: 20 U/L (ref 15–41)
Albumin: 4.2 g/dL (ref 3.5–5.0)
Alkaline Phosphatase: 27 U/L — ABNORMAL LOW (ref 38–126)
Anion gap: 11 (ref 5–15)
BUN: 19 mg/dL (ref 8–23)
CO2: 26 mmol/L (ref 22–32)
Calcium: 9.5 mg/dL (ref 8.9–10.3)
Chloride: 102 mmol/L (ref 98–111)
Creatinine, Ser: 1.02 mg/dL — ABNORMAL HIGH (ref 0.44–1.00)
GFR calc Af Amer: >60 mL/min (ref >60)
GFR calc non Af Amer: 52 mL/min — ABNORMAL LOW (ref >60)
Glucose, Bld: 96 mg/dL (ref 70–99)
Potassium: 3.9 mmol/L (ref 3.5–5.1)
Sodium: 139 mmol/L (ref 135–145)
Total Bilirubin: 0.6 mg/dL (ref 0.3–1.2)
Total Protein: 8 g/dL (ref 6.5–8.1)

## 2018-08-16 NOTE — Pre-Procedure Instructions (Addendum)
Monique Day  08/16/2018      [facility] - [place], [place] - [address] [place] [address] Phone: [phone] Fax: [phone]    Your procedure is scheduled on Thursday, January 2   Report to [facility] Admitting at 0930 A.M.             (posted surgery time 11:30a - 1p)   Call this number if you have problems the morning of surgery:  [phone]   Remember:  Do not eat any foods after midnight.                          You may have clear liquids (black coffee, water, gatorade,) up to 3 hrs prior to surgery time (8:30)             Please consume the pre surgery drink 3 hrs prior to surgery also.    Take these medicines the morning of surgery with A SIP OF WATER  acetaminophen (TYLENOL)  Eye drops if needed  7 days prior to surgery STOP taking any Aspirin (unless otherwise instructed by your surgeon), Aleve, Naproxen, Ibuprofen, Motrin, Advil, Goody's, BC's, all herbal medications, fish oil, and all vitamins.  FOLLOW PHYSICIANS INSTRUCTIONS ABOUT ELIQUIS    Do not wear jewelry, make-up or nail polish.  Do not wear lotions, powders, or perfumes, or deodorant.  Do not shave 48 hours prior to surgery.    Do not bring valuables to the hospital.  [facility] is not responsible for any belongings or valuables.  Contacts, dentures or bridgework may not be worn into surgery.  Leave your suitcase in the car.  After surgery it may be brought to your room.  For patients admitted to the hospital, discharge time will be determined by your treatment team.  Patients discharged the day of surgery will not be allowed to drive home.    Special instructions:   [place]- Preparing For Surgery  Before surgery, you can play an important role. Because skin is not sterile, your skin needs to be as free of germs as possible. You can reduce the number of germs on your skin by washing with CHG (chlorahexidine gluconate) Soap before  surgery.  CHG is an antiseptic cleaner which kills germs and bonds with the skin to continue killing germs even after washing.    Oral Hygiene is also important to reduce your risk of infection.  Remember - BRUSH YOUR TEETH THE MORNING OF SURGERY WITH YOUR REGULAR TOOTHPASTE  Please do not use if you have an allergy to CHG or antibacterial soaps. If your skin becomes reddened/irritated stop using the CHG.  Do not shave (including legs and underarms) for at least 48 hours prior to first CHG shower. It is OK to shave your face.  Please follow these instructions carefully.   1. Shower the NIGHT BEFORE SURGERY and the MORNING OF SURGERY with CHG.   2. If you chose to wash your hair, wash your hair first as usual with your normal shampoo.  3. After you shampoo, rinse your hair and body thoroughly to remove the shampoo.  4. Use CHG as you would any other liquid soap. You can apply CHG directly to the skin and wash gently with a scrungie or a clean washcloth.   5. Apply the CHG Soap to your body ONLY FROM THE NECK DOWN.  Do not use on open  wounds or open sores. Avoid contact with your eyes, ears, mouth and genitals (private parts). Wash Face and genitals (private parts)  with your normal soap.  6. Wash thoroughly, paying special attention to the area where your surgery will be performed.  7. Thoroughly rinse your body with warm water from the neck down.  8. DO NOT shower/wash with your normal soap after using and rinsing off the CHG Soap.  9. Pat yourself dry with a CLEAN TOWEL.  10. Wear CLEAN PAJAMAS to bed the night before surgery, wear comfortable clothes the morning of surgery  11. Place CLEAN SHEETS on your bed the night of your first shower and DO NOT SLEEP WITH PETS.    Day of Surgery:  Do not apply any deodorants/lotions.  Please wear clean clothes to the hospital/surgery center.   Remember to brush your teeth WITH YOUR REGULAR TOOTHPASTE.    Please read over the following  fact sheets that you were given.

## 2018-08-16 NOTE — Progress Notes (Signed)
PCP is Dr. Ferd Hibbs  LOV 07/2018.  She has instructed the patient to take her last dose of eliquis on 12/30.   Gertie Fey is Dr. Joya Gaskins    She has had 2 episodes of DVT.  Most recent was back in June 2019.   IVC filter placed in 06/2012 by IR. Denies murmur, cp, sob.  She did have some studies done in June @ [facility].  Have requested those records.

## 2018-08-19 ENCOUNTER — Encounter (HOSPITAL_COMMUNITY): Payer: Self-pay | Admitting: Anesthesiology

## 2018-08-19 ENCOUNTER — Other Ambulatory Visit: Payer: Self-pay

## 2018-08-19 ENCOUNTER — Encounter (HOSPITAL_COMMUNITY)
Admission: RE | Disposition: A | Discharge status: Home / Self Care | Payer: Self-pay | Source: Home / Self Care | Attending: Surgery

## 2018-08-19 ENCOUNTER — Ambulatory Visit (HOSPITAL_COMMUNITY): Payer: Medicare Other | Admitting: Physician Assistant

## 2018-08-19 ENCOUNTER — Ambulatory Visit (HOSPITAL_COMMUNITY): Payer: Medicare Other | Admitting: Certified Registered Nurse Anesthetist

## 2018-08-19 ENCOUNTER — Observation Stay (HOSPITAL_COMMUNITY)
Admission: RE | Admit: 2018-08-19 | Discharge: 2018-08-20 | Disposition: A | Discharge status: Home / Self Care | Payer: Medicare Other | Attending: Surgery | Admitting: Surgery

## 2018-08-19 DIAGNOSIS — C50912 Malignant neoplasm of unspecified site of left female breast: Principal | ICD-10-CM | POA: Diagnosis present

## 2018-08-19 DIAGNOSIS — Z87891 Personal history of nicotine dependence: Secondary | ICD-10-CM | POA: Insufficient documentation

## 2018-08-19 DIAGNOSIS — I10 Essential (primary) hypertension: Secondary | ICD-10-CM | POA: Insufficient documentation

## 2018-08-19 DIAGNOSIS — Z882 Allergy status to sulfonamides status: Secondary | ICD-10-CM | POA: Insufficient documentation

## 2018-08-19 DIAGNOSIS — Z9011 Acquired absence of right breast and nipple: Secondary | ICD-10-CM | POA: Insufficient documentation

## 2018-08-19 DIAGNOSIS — N926 Irregular menstruation, unspecified: Secondary | ICD-10-CM | POA: Insufficient documentation

## 2018-08-19 DIAGNOSIS — K5190 Ulcerative colitis, unspecified, without complications: Secondary | ICD-10-CM | POA: Insufficient documentation

## 2018-08-19 DIAGNOSIS — Z923 Personal history of irradiation: Secondary | ICD-10-CM | POA: Insufficient documentation

## 2018-08-19 DIAGNOSIS — Z79899 Other long term (current) drug therapy: Secondary | ICD-10-CM | POA: Insufficient documentation

## 2018-08-19 HISTORY — DX: Malignant neoplasm of unspecified site of right female breast: C50.911

## 2018-08-19 HISTORY — PX: SIMPLE MASTECTOMY WITH AXILLARY SENTINEL NODE BIOPSY: SHX6098

## 2018-08-19 HISTORY — DX: Malignant neoplasm of unspecified site of left female breast: C50.912

## 2018-08-19 HISTORY — DX: Personal history of other medical treatment: Z92.89

## 2018-08-19 HISTORY — PX: MASTECTOMY COMPLETE / SIMPLE: SUR845

## 2018-08-19 LAB — PROTIME-INR
INR: 1.05
Prothrombin Time: 13.6 seconds (ref 11.4–15.2)

## 2018-08-19 SURGERY — SIMPLE MASTECTOMY
Anesthesia: General | Site: Breast | Laterality: Left

## 2018-08-19 MED ORDER — FENTANYL CITRATE (PF) 100 MCG/2ML IJ SOLN: 25.0000 ug | INTRAMUSCULAR | Status: DC | PRN

## 2018-08-19 MED ORDER — OXYCODONE HCL 5 MG PO TABS: 5.0000 mg | ORAL_TABLET | ORAL | Status: DC | PRN

## 2018-08-19 MED ORDER — ONDANSETRON 4 MG PO TBDP: 4.0000 mg | ORAL_TABLET | Freq: Four times a day (QID) | ORAL | Status: DC | PRN

## 2018-08-19 MED ORDER — EPHEDRINE SULFATE-NACL 50-0.9 MG/10ML-% IV SOSY
PREFILLED_SYRINGE | INTRAVENOUS | Status: DC | PRN
  Administered 2018-08-19 (×2): 10 mg via INTRAVENOUS

## 2018-08-19 MED ORDER — MEPERIDINE HCL 50 MG/ML IJ SOLN: 6.2500 mg | INTRAMUSCULAR | Status: DC | PRN

## 2018-08-19 MED ORDER — FENTANYL CITRATE (PF) 100 MCG/2ML IJ SOLN
INTRAMUSCULAR | Status: DC | PRN
  Administered 2018-08-19: 25 ug via INTRAVENOUS

## 2018-08-19 MED ORDER — LACTATED RINGERS IV SOLN
INTRAVENOUS | Status: DC
  Administered 2018-08-19: 11:00:00 via INTRAVENOUS

## 2018-08-19 MED ORDER — CEFAZOLIN SODIUM-DEXTROSE 2-4 GM/100ML-% IV SOLN
2.0000 g | Freq: Three times a day (TID) | INTRAVENOUS | Status: AC
  Administered 2018-08-19: 2 g via INTRAVENOUS
  Filled 2018-08-19: qty 100

## 2018-08-19 MED ORDER — 0.9 % SODIUM CHLORIDE (POUR BTL) OPTIME
TOPICAL | Status: DC | PRN
  Administered 2018-08-19: 1000 mL

## 2018-08-19 MED ORDER — PROPOFOL 10 MG/ML IV BOLUS
INTRAVENOUS | Status: DC | PRN
  Administered 2018-08-19: 150 mg via INTRAVENOUS

## 2018-08-19 MED ORDER — DENOSUMAB 60 MG/ML ~~LOC~~ SOLN: 60.0000 mg | SUBCUTANEOUS | Status: DC

## 2018-08-19 MED ORDER — NAPHAZOLINE-PHENIRAMINE 0.027-0.315 % OP SOLN: 1.0000 [drp] | Freq: Every day | OPHTHALMIC | Status: DC | PRN

## 2018-08-19 MED ORDER — ACETAMINOPHEN 500 MG PO TABS
ORAL_TABLET | ORAL | Status: AC
  Administered 2018-08-19: 1000 mg via ORAL
  Filled 2018-08-19: qty 2

## 2018-08-19 MED ORDER — CEFAZOLIN SODIUM-DEXTROSE 2-4 GM/100ML-% IV SOLN
INTRAVENOUS | Status: AC
  Filled 2018-08-19: qty 100

## 2018-08-19 MED ORDER — PHENYLEPHRINE 40 MCG/ML (10ML) SYRINGE FOR IV PUSH (FOR BLOOD PRESSURE SUPPORT)
PREFILLED_SYRINGE | INTRAVENOUS | Status: DC | PRN
  Administered 2018-08-19: 80 ug via INTRAVENOUS

## 2018-08-19 MED ORDER — KETOROLAC TROMETHAMINE 15 MG/ML IJ SOLN
15.0000 mg | INTRAMUSCULAR | Status: AC
  Administered 2018-08-19: 15 mg via INTRAVENOUS

## 2018-08-19 MED ORDER — PROMETHAZINE HCL 25 MG/ML IJ SOLN: 6.2500 mg | INTRAMUSCULAR | Status: DC | PRN

## 2018-08-19 MED ORDER — GABAPENTIN 300 MG PO CAPS
ORAL_CAPSULE | ORAL | Status: AC
  Administered 2018-08-19: 300 mg via ORAL
  Filled 2018-08-19: qty 1

## 2018-08-19 MED ORDER — CHLORHEXIDINE GLUCONATE CLOTH 2 % EX PADS: 6.0000 | MEDICATED_PAD | Freq: Once | CUTANEOUS | Status: DC

## 2018-08-19 MED ORDER — BUPIVACAINE HCL (PF) 0.25 % IJ SOLN
INTRAMUSCULAR | Status: DC | PRN
  Administered 2018-08-19 (×10): 5 mL

## 2018-08-19 MED ORDER — DEXTROSE-NACL 5-0.9 % IV SOLN
INTRAVENOUS | Status: DC
  Administered 2018-08-19 – 2018-08-20 (×2): via INTRAVENOUS

## 2018-08-19 MED ORDER — METHOCARBAMOL 500 MG PO TABS
500.0000 mg | ORAL_TABLET | Freq: Four times a day (QID) | ORAL | Status: DC | PRN
  Administered 2018-08-19: 500 mg via ORAL
  Filled 2018-08-19: qty 1

## 2018-08-19 MED ORDER — GABAPENTIN 300 MG PO CAPS
300.0000 mg | ORAL_CAPSULE | Freq: Two times a day (BID) | ORAL | Status: DC
  Administered 2018-08-19: 300 mg via ORAL
  Filled 2018-08-19: qty 1

## 2018-08-19 MED ORDER — ACETAMINOPHEN 500 MG PO TABS
1000.0000 mg | ORAL_TABLET | ORAL | Status: AC
  Administered 2018-08-19: 1000 mg via ORAL

## 2018-08-19 MED ORDER — ANASTROZOLE 1 MG PO TABS
1.0000 mg | ORAL_TABLET | Freq: Every day | ORAL | Status: DC
  Administered 2018-08-19: 1 mg via ORAL
  Filled 2018-08-19: qty 1

## 2018-08-19 MED ORDER — ONDANSETRON HCL 4 MG/2ML IJ SOLN
INTRAMUSCULAR | Status: AC
  Filled 2018-08-19: qty 2

## 2018-08-19 MED ORDER — EPHEDRINE 5 MG/ML INJ
INTRAVENOUS | Status: AC
  Filled 2018-08-19: qty 10

## 2018-08-19 MED ORDER — MESALAMINE ER 0.375 G PO CP24
1.5000 g | ORAL_CAPSULE | Freq: Every day | ORAL | Status: DC
  Filled 2018-08-19: qty 4

## 2018-08-19 MED ORDER — ENOXAPARIN SODIUM 40 MG/0.4ML ~~LOC~~ SOLN: 40.0000 mg | SUBCUTANEOUS | Status: DC

## 2018-08-19 MED ORDER — KETOROLAC TROMETHAMINE 15 MG/ML IJ SOLN
INTRAMUSCULAR | Status: AC
  Administered 2018-08-19: 15 mg via INTRAVENOUS
  Filled 2018-08-19: qty 1

## 2018-08-19 MED ORDER — GABAPENTIN 300 MG PO CAPS
300.0000 mg | ORAL_CAPSULE | ORAL | Status: AC
  Administered 2018-08-19: 300 mg via ORAL

## 2018-08-19 MED ORDER — CEFAZOLIN SODIUM-DEXTROSE 2-4 GM/100ML-% IV SOLN
2.0000 g | INTRAVENOUS | Status: AC
  Administered 2018-08-19: 2 g via INTRAVENOUS

## 2018-08-19 MED ORDER — LIDOCAINE 2% (20 MG/ML) 5 ML SYRINGE
INTRAMUSCULAR | Status: AC
  Filled 2018-08-19: qty 5

## 2018-08-19 MED ORDER — HYDRALAZINE HCL 20 MG/ML IJ SOLN: 10.0000 mg | INTRAMUSCULAR | Status: DC | PRN

## 2018-08-19 MED ORDER — ONDANSETRON HCL 4 MG/2ML IJ SOLN: 4.0000 mg | Freq: Four times a day (QID) | INTRAMUSCULAR | Status: DC | PRN

## 2018-08-19 MED ORDER — FENTANYL CITRATE (PF) 250 MCG/5ML IJ SOLN
INTRAMUSCULAR | Status: AC
  Filled 2018-08-19: qty 5

## 2018-08-19 MED ORDER — LIDOCAINE 2% (20 MG/ML) 5 ML SYRINGE
INTRAMUSCULAR | Status: DC | PRN
  Administered 2018-08-19: 80 mg via INTRAVENOUS

## 2018-08-19 MED ORDER — CALCIUM CARB-CHOLECALCIFEROL 500-600 MG-UNIT PO TABS: 2.0000 | ORAL_TABLET | Freq: Every day | ORAL | Status: DC

## 2018-08-19 MED ORDER — VITAMIN C 500 MG PO TABS
1000.0000 mg | ORAL_TABLET | Freq: Every day | ORAL | Status: DC
  Administered 2018-08-19: 1000 mg via ORAL
  Filled 2018-08-19: qty 2

## 2018-08-19 MED ORDER — PHENYLEPHRINE 40 MCG/ML (10ML) SYRINGE FOR IV PUSH (FOR BLOOD PRESSURE SUPPORT)
PREFILLED_SYRINGE | INTRAVENOUS | Status: AC
  Filled 2018-08-19: qty 10

## 2018-08-19 MED ORDER — MIDAZOLAM HCL 2 MG/2ML IJ SOLN
INTRAMUSCULAR | Status: AC
  Administered 2018-08-19: 2 mg
  Filled 2018-08-19: qty 2

## 2018-08-19 MED ORDER — ACETAMINOPHEN 500 MG PO TABS
500.0000 mg | ORAL_TABLET | Freq: Every day | ORAL | Status: DC | PRN
  Administered 2018-08-19: 500 mg via ORAL
  Filled 2018-08-19 (×2): qty 1

## 2018-08-19 MED ORDER — CALCIUM CARBONATE-VITAMIN D 500-200 MG-UNIT PO TABS: 2.0000 | ORAL_TABLET | Freq: Every day | ORAL | Status: DC

## 2018-08-19 MED ORDER — ONDANSETRON HCL 4 MG/2ML IJ SOLN
INTRAMUSCULAR | Status: DC | PRN
  Administered 2018-08-19: 4 mg via INTRAVENOUS

## 2018-08-19 MED ORDER — FENTANYL CITRATE (PF) 100 MCG/2ML IJ SOLN
INTRAMUSCULAR | Status: AC
  Administered 2018-08-19: 100 ug
  Filled 2018-08-19: qty 2

## 2018-08-19 MED ORDER — DEXAMETHASONE SODIUM PHOSPHATE 10 MG/ML IJ SOLN
INTRAMUSCULAR | Status: DC | PRN
  Administered 2018-08-19: 10 mg via INTRAVENOUS

## 2018-08-19 MED ORDER — DEXAMETHASONE SODIUM PHOSPHATE 10 MG/ML IJ SOLN
INTRAMUSCULAR | Status: AC
  Filled 2018-08-19: qty 1

## 2018-08-19 MED ORDER — ROCURONIUM BROMIDE 50 MG/5ML IV SOSY
PREFILLED_SYRINGE | INTRAVENOUS | Status: AC
  Filled 2018-08-19: qty 5

## 2018-08-19 MED ORDER — FENTANYL CITRATE (PF) 100 MCG/2ML IJ SOLN: 50.0000 ug | INTRAMUSCULAR | Status: DC | PRN

## 2018-08-19 SURGICAL SUPPLY — 50 items
APPLIER CLIP 9.375 MED OPEN (MISCELLANEOUS) ×3
BINDER BREAST LRG (GAUZE/BANDAGES/DRESSINGS) IMPLANT
BINDER BREAST XLRG (GAUZE/BANDAGES/DRESSINGS) IMPLANT
BIOPATCH RED 1 DISK 7.0 (GAUZE/BANDAGES/DRESSINGS) ×2 IMPLANT
BIOPATCH RED 1IN DISK 7.0MM (GAUZE/BANDAGES/DRESSINGS) ×2
CANISTER SUCT 3000ML PPV (MISCELLANEOUS) ×3 IMPLANT
CHLORAPREP W/TINT 26ML (MISCELLANEOUS) ×3 IMPLANT
CLIP APPLIE 9.375 MED OPEN (MISCELLANEOUS) ×1 IMPLANT
COVER SURGICAL LIGHT HANDLE (MISCELLANEOUS) ×3 IMPLANT
COVER WAND RF STERILE (DRAPES) ×3 IMPLANT
DERMABOND ADHESIVE PROPEN (GAUZE/BANDAGES/DRESSINGS) ×2
DERMABOND ADVANCED (GAUZE/BANDAGES/DRESSINGS) ×2
DERMABOND ADVANCED .7 DNX12 (GAUZE/BANDAGES/DRESSINGS) IMPLANT
DERMABOND ADVANCED .7 DNX6 (GAUZE/BANDAGES/DRESSINGS) ×1 IMPLANT
DEVICE DISSECT PLASMABLAD 3.0S (MISCELLANEOUS) ×1 IMPLANT
DRAIN CHANNEL 19F RND (DRAIN) ×5 IMPLANT
DRAPE LAPAROSCOPIC ABDOMINAL (DRAPES) ×3 IMPLANT
DRAPE UTILITY XL STRL (DRAPES) ×6 IMPLANT
DRSG TEGADERM 4X4.75 (GAUZE/BANDAGES/DRESSINGS) ×2 IMPLANT
ELECT BLADE 4.0 EZ CLEAN MEGAD (MISCELLANEOUS) ×3
ELECT CAUTERY BLADE 6.4 (BLADE) ×3 IMPLANT
ELECT REM PT RETURN 9FT ADLT (ELECTROSURGICAL) ×3
ELECTRODE BLDE 4.0 EZ CLN MEGD (MISCELLANEOUS) IMPLANT
ELECTRODE REM PT RTRN 9FT ADLT (ELECTROSURGICAL) ×1 IMPLANT
EVACUATOR SILICONE 100CC (DRAIN) ×3 IMPLANT
GAUZE SPONGE 4X4 12PLY STRL (GAUZE/BANDAGES/DRESSINGS) ×3 IMPLANT
GLOVE BIO SURGEON STRL SZ8 (GLOVE) ×3 IMPLANT
GLOVE BIOGEL PI IND STRL 7.0 (GLOVE) IMPLANT
GLOVE BIOGEL PI IND STRL 8 (GLOVE) ×1 IMPLANT
GLOVE BIOGEL PI INDICATOR 7.0 (GLOVE) ×2
GLOVE BIOGEL PI INDICATOR 8 (GLOVE) ×2
GOWN STRL REUS W/ TWL LRG LVL3 (GOWN DISPOSABLE) ×2 IMPLANT
GOWN STRL REUS W/ TWL XL LVL3 (GOWN DISPOSABLE) ×1 IMPLANT
GOWN STRL REUS W/TWL LRG LVL3 (GOWN DISPOSABLE) ×4
GOWN STRL REUS W/TWL XL LVL3 (GOWN DISPOSABLE) ×2
KIT BASIN OR (CUSTOM PROCEDURE TRAY) ×3 IMPLANT
KIT TURNOVER KIT B (KITS) ×3 IMPLANT
NS IRRIG 1000ML POUR BTL (IV SOLUTION) ×3 IMPLANT
PACK GENERAL/GYN (CUSTOM PROCEDURE TRAY) ×3 IMPLANT
PAD ABD 8X10 STRL (GAUZE/BANDAGES/DRESSINGS) ×4 IMPLANT
PAD ARMBOARD 7.5X6 YLW CONV (MISCELLANEOUS) ×3 IMPLANT
PENCIL SMOKE EVACUATOR (MISCELLANEOUS) ×3 IMPLANT
PLASMABLADE 3.0S (MISCELLANEOUS) ×3
SLEEVE SUCTION 125 (MISCELLANEOUS) ×2 IMPLANT
SPECIMEN JAR X LARGE (MISCELLANEOUS) ×3 IMPLANT
SUT ETHILON 3 0 FSL (SUTURE) ×5 IMPLANT
SUT MNCRL AB 4-0 PS2 18 (SUTURE) ×5 IMPLANT
SUT VIC AB 3-0 SH 18 (SUTURE) ×5 IMPLANT
TOWEL OR 17X24 6PK STRL BLUE (TOWEL DISPOSABLE) ×3 IMPLANT
TOWEL OR 17X26 10 PK STRL BLUE (TOWEL DISPOSABLE) ×3 IMPLANT

## 2018-08-19 NOTE — Interval H&P Note (Signed)
History and Physical Interval Note:  08/19/2018 10:53 AM  Monique Day  has presented today for surgery, with the diagnosis of LEFT BREAST CANCER  The various methods of treatment have been discussed with the patient and family. After consideration of risks, benefits and other options for treatment, the patient has consented to  Procedure(s): LEFT SIMPLE MASTECTOMY (Left) as a surgical intervention .  The patient's history has been reviewed, patient examined, no change in status, stable for surgery.  I have reviewed the patient's chart and labs.  Questions were answered to the patient's satisfaction.     [place]

## 2018-08-19 NOTE — Anesthesia Postprocedure Evaluation (Signed)
Anesthesia Post Note  Patient: Monique Day  Procedure(s) Performed: LEFT SIMPLE MASTECTOMY (Left Breast)     Patient location during evaluation: PACU Anesthesia Type: General Level of consciousness: awake Pain management: pain level controlled Vital Signs Assessment: post-procedure vital signs reviewed and stable Respiratory status: spontaneous breathing Cardiovascular status: stable Postop Assessment: no apparent nausea or vomiting Anesthetic complications: no    Last Vitals:  Vitals:   08/19/18 1125 08/19/18 1300  BP: (!) 104/53   Pulse: 66   Resp: 13   Temp:  (!) 36.1 C  SpO2: 98%     Last Pain:  Vitals:   08/19/18 1330  PainSc: 0-No pain   Pain Goal:                 Huston Foley

## 2018-08-19 NOTE — H&P (Signed)
Pascal Lux Documented: 07/19/2018 10:16 AM Location: [facility] Patient #: [id number] DOB: 1936/03/03 Widowed / Language: Cleophus Molt / Race: White Female  History of Present Illness Marcello Moores A. Gurtha Picker MD; 07/19/2018 11:35 AM) Patient words: Patient sent at the request of Dr. Octavio Graves for a mammographic abnormality detected recently on screening mammogram. A 7 mm lesion was identified left breast upper outer quadrant. 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 She has a history of right mastectomy for breast cancer over 20 years ago. She has a history of left breast lumpectomy 10 years ago for stage I breast cancer followed by radiation therapy. The patient is sort the biopsy site. She had no symptoms prior to her mammogram. Patient denies any mass, nipple discharge or discomfort.          ADDITIONAL INFORMATION: PROGNOSTIC INDICATORS Results: IMMUNOHISTOCHEMICAL AND MORPHOMETRIC ANALYSIS PERFORMED MANUALLY The tumor cells are NEGATIVE for Her2 (1+). Estrogen Receptor: 100%, POSITIVE, STRONG STAINING INTENSITY Progesterone Receptor: 0%, NEGATIVE Proliferation Marker Ki67: 10% COMMENT: The negative hormone receptor study(ies) in this case has An internal positive control. REFERENCE RANGE ESTROGEN RECEPTOR NEGATIVE 0% POSITIVE =>1% REFERENCE RANGE PROGESTERONE RECEPTOR NEGATIVE 0% POSITIVE =>1% All controls stained appropriately Enid Cutter MD Pathologist, Electronic Signature ( Signed 07/02/2018) FINAL DIAGNOSIS Diagnosis Breast, left, needle core biopsy, upper inner - INVASIVE DUCTAL CARCINOMA. 1 of 3 FINAL for KARLEA, MCKIBBIN ([id number]) Microscopic Comment The carcinoma appears grade I. Immunohistochemistry for P63, Calponin and SMM-1 demonstrates the absence of myoepithelium throughout. A breast prognostic profile will be performed and reported separately. The results were  reported to [facility] on 06/30/2018. Intradepartmental consultation was obtained (Dr. Tresa Moore). Gillie Manners MD Pathologist, Electronic Signature (Case signed 07/01/2018) Specimen Gross and Clinical Information Specimen Comment In formalin 8:40, extracted M 76 minute; 83 year old female with irregular mass in UI left breast; recent DX of left breast IMC at [facility]; HX of B breast cancers with R mastectomy and L lumpectomy Specimen(s) Obtained: Breast, left,.  The patient is a 83 year old female.   Past Surgical History Emeline Gins, CMA; 07/19/2018 10:17 AM) Appendectomy Breast Biopsy multiple Breast Mass; Local Excision Left. Cataract Surgery Bilateral. Mastectomy Right. Oral Surgery Resection of Small Bowel  Diagnostic Studies History Emeline Gins, [place]; 07/19/2018 10:17 AM) Colonoscopy 1-5 years ago Mammogram within last year Pap Smear >5 years ago  Allergies Emeline Gins, [place]; 07/19/2018 10:20 AM) Sulfa Antibiotics Allergies Reconciled  Medication History Emeline Gins, CMA; 07/19/2018 10:20 AM) Humira Pen (40MG/0.8ML Pen-inj Kit, Subcutaneous) Active. Mesalamine (1.2GM Tablet DR, Oral) Active. Prolia (60MG/ML Soln Pref Syr, Subcutaneous) Active. Eliquis (5MG Tablet, Oral) Active. Medications Reconciled  Social History Emeline Gins, [place]; 07/19/2018 10:17 AM) Alcohol use Occasional alcohol use. Caffeine use Coffee, Tea. No drug use Tobacco use Former smoker.  Family History Emeline Gins, [place]; 07/19/2018 10:17 AM) Arthritis Father. Hypertension Father.  Pregnancy / Birth History Emeline Gins, [place]; 07/19/2018 10:17 AM) Age at menarche 37 years. Age of menopause 33-55 Gravida 0 Irregular periods Para 0  Other Problems Emeline Gins, [place]; 07/19/2018 10:17 AM) Breast Cancer Cerebrovascular Accident Lump In Breast Pulmonary Embolism / Blood Clot in Legs Ulcerative  Colitis     Review of Systems Emeline Gins CMA; 07/19/2018 10:17 AM) General Not Present- Appetite Loss, Chills, Fatigue, Fever, Night Sweats, Weight Gain and Weight Loss. Skin Present- Dryness. Not Present- Change in Wart/Mole, Hives, Jaundice, New Lesions, Non-Healing Wounds, Rash and Ulcer. Respiratory  Present- Snoring. Not Present- Bloody sputum, Chronic Cough, Difficulty Breathing and Wheezing. Breast Present- Breast Mass. Not Present- Breast Pain, Nipple Discharge and Skin Changes. Cardiovascular Present- Leg Cramps. Not Present- Chest Pain, Difficulty Breathing Lying Down, Palpitations, Rapid Heart Rate, Shortness of Breath and Swelling of Extremities. Gastrointestinal Not Present- Abdominal Pain, Bloating, Bloody Stool, Change in Bowel Habits, Chronic diarrhea, Constipation, Difficulty Swallowing, Excessive gas, Gets full quickly at meals, Hemorrhoids, Indigestion, Nausea, Rectal Pain and Vomiting. Female Genitourinary Not Present- Frequency, Nocturia, Painful Urination, Pelvic Pain and Urgency. Musculoskeletal Not Present- Back Pain, Joint Pain, Joint Stiffness, Muscle Pain, Muscle Weakness and Swelling of Extremities. Neurological Not Present- Decreased Memory, Fainting, Headaches, Numbness, Seizures, Tingling, Tremor, Trouble walking and Weakness. Psychiatric Not Present- Anxiety, Bipolar, Change in Sleep Pattern, Depression, Fearful and Frequent crying. Endocrine Not Present- Cold Intolerance, Excessive Hunger, Hair Changes, Heat Intolerance, Hot flashes and New Diabetes. Hematology Present- Blood Thinners. Not Present- Easy Bruising, Excessive bleeding, Gland problems, HIV and Persistent Infections.  Vitals Emeline Gins CMA; 07/19/2018 10:18 AM) 07/19/2018 10:17 AM Weight: 176.5 lb Height: 63in Body Surface Area: 1.83 m Body Mass Index: 31.27 kg/m  Temp.: 97.61F  Pulse: 102 (Regular)  BP: 140/84 (Sitting, Left Arm, Standard)      Physical Exam  (Valon Glasscock A. Aloma Boch MD; 07/19/2018 11:36 AM)  General Mental Status-Alert. General Appearance-Consistent with stated age. Hydration-Well hydrated. Voice-Normal.  Head and Neck Head-normocephalic, atraumatic with no lesions or palpable masses. Trachea-midline. Thyroid Gland Characteristics - normal size and consistency.  Chest and Lung Exam Chest and lung exam reveals -quiet, even and easy respiratory effort with no use of accessory muscles and on auscultation, normal breath sounds, no adventitious sounds and normal vocal resonance. Inspection Chest Wall - Normal. Back - normal.  Breast Note: Right breast surgically absent. Left breast shows lumpectomy scar. Post radiation markings noted. Mild swelling of biopsy site.  Cardiovascular Cardiovascular examination reveals -normal heart sounds, regular rate and rhythm with no murmurs and normal pedal pulses bilaterally.  Neurologic Neurologic evaluation reveals -alert and oriented x 3 with no impairment of recent or remote memory. Mental Status-Normal.  Lymphatic Head & Neck  General Head & Neck Lymphatics: Bilateral - Description - Normal. Axillary  General Axillary Region: Bilateral - Description - Normal. Tenderness - Non Tender.    Assessment & Plan (Che Rachal A. Katryna Tschirhart MD; 07/19/2018 11:36 AM)  BREAST CANCER, LEFT (C50.912) Impression: Stage I  Discussed left simple mastectomy since she's had previous radiation therapy. The patient is in agreement. She has no interest in reconstruction. Discussed treatment options for breast cancer to include breast conservation vs mastectomy with reconstruction. Pt has decided on mastectomy. Risk include bleeding, infection, flap necrosis, pain, numbness, recurrence, hematoma, other surgery needs. Pt understands and agrees to proceed.  Current Plans You are being scheduled for surgery- Our schedulers will call you.  You should hear from our office's  scheduling department within 5 working days about the location, date, and time of surgery. We try to make accommodations for patient's preferences in scheduling surgery, but sometimes the OR schedule or the surgeon's schedule prevents [place] from making those accommodations.  If you have not heard from our office [phone] in 5 working days, call the office and ask for your surgeon's nurse.  If you have other questions about your diagnosis, plan, or surgery, call the office and ask for your surgeon's nurse.  Pt Education - [facility] Breast Cancer Information Given - [facility] "Breast Journey" Package We discussed the staging and pathophysiology of breast  cancer. We discussed all of the different options for treatment for breast cancer including surgery, chemotherapy, radiation therapy, Herceptin, and antiestrogen therapy. We discussed a sentinel lymph node biopsy as she does not appear to having lymph node involvement right now. We discussed the performance of that with injection of radioactive tracer and blue dye. We discussed that she would have an incision underneath her axillary hairline. We discussed that there is a bout a 10-20% chance of having a positive node with a sentinel lymph node biopsy and we will await the permanent pathology to make any other first further decisions in terms of her treatment. One of these options might be to return to the operating room to perform an axillary lymph node dissection. We discussed about a 1-2% risk lifetime of chronic shoulder pain as well as lymphedema associated with a sentinel lymph node biopsy. We discussed the options for treatment of the breast cancer which included lumpectomy versus a mastectomy. We discussed the performance of the lumpectomy with a wire placement. We discussed a 10-20% chance of a positive margin requiring reexcision in the operating room. We also discussed that she may need radiation therapy or antiestrogen therapy or both if she  undergoes lumpectomy. We discussed the mastectomy and the postoperative care for that as well. We discussed that there is no difference in her survival whether she undergoes lumpectomy with radiation therapy or antiestrogen therapy versus a mastectomy. There is a slight difference in the local recurrence rate being 3-5% with lumpectomy and about 1% with a mastectomy. We discussed the risks of operation including bleeding, infection, possible reoperation. She understands her further therapy will be based on what her stages at the time of her operation.  Pt Education - [facility] breast cancer surgery: discussed with patient and provided information. Pt Education - [facility] Mastectomy HCI Pt Education - ABC (After Breast Cancer) Class Info: discussed with patient and provided information.

## 2018-08-19 NOTE — Anesthesia Procedure Notes (Signed)
Anesthesia Regional Block: Pectoralis block   Pre-Anesthetic Checklist: ,, timeout performed, Correct Patient, Correct Site, Correct Laterality, Correct Procedure, Correct Position, site marked, Risks and benefits discussed,  Surgical consent,  Pre-op evaluation,  At surgeon's request and post-op pain management  Laterality: Left and N/A  Prep: chloraprep       Needles:  Injection technique: Single-shot  Needle Type: Echogenic Stimulator Needle     Needle Length: 10cm  Needle Gauge: 21   Needle insertion depth: 1 cm   Additional Needles:   Procedures:,,,, ultrasound used (permanent image in chart),,,,  Narrative:  Start time: 08/19/2018 10:55 AM End time: 08/19/2018 11:10 AM Injection made incrementally with aspirations every 5 mL.  Performed by: Personally  Anesthesiologist: Lyn Hollingshead, MD

## 2018-08-19 NOTE — Transfer of Care (Signed)
Immediate Anesthesia Transfer of Care Note  Patient: Monique Day  Procedure(s) Performed: LEFT SIMPLE MASTECTOMY (Left Breast)  Patient Location: PACU  Anesthesia Type:GA combined with regional for post-op pain  Level of Consciousness: awake, alert  and oriented  Airway & Oxygen Therapy: Patient Spontanous Breathing and Patient connected to face mask oxygen  Post-op Assessment: Report given to RN and Post -op Vital signs reviewed and stable  Post vital signs: Reviewed and stable  Last Vitals:  Vitals Value Taken Time  BP 144/74 08/19/2018 12:59 PM  Temp    Pulse 90 08/19/2018 12:59 PM  Resp 9 08/19/2018 12:59 PM  SpO2 100 % 08/19/2018 12:59 PM  Vitals shown include unvalidated device data.  Last Pain:  Vitals:   08/19/18 0952  PainSc: 0-No pain         Complications: No apparent anesthesia complications

## 2018-08-19 NOTE — Anesthesia Procedure Notes (Signed)
Procedure Name: LMA Insertion Date/Time: 08/19/2018 11:44 AM Performed by: Genelle Bal, CRNA Pre-anesthesia Checklist: Patient identified, Emergency Drugs available, Suction available and Patient being monitored Patient Re-evaluated:Patient Re-evaluated prior to induction Oxygen Delivery Method: Circle system utilized Preoxygenation: Pre-oxygenation with 100% oxygen Induction Type: IV induction Ventilation: Mask ventilation without difficulty LMA: LMA inserted LMA Size: 4.0 Number of attempts: 1 Airway Equipment and Method: Bite block Placement Confirmation: positive ETCO2 Tube secured with: Tape Dental Injury: Teeth and Oropharynx as per pre-operative assessment

## 2018-08-19 NOTE — Op Note (Signed)
Preoperative diagnosis: Stage I left breast cancer  Postoperative diagnosis: Same  Procedure: Left simple mastectomy  Surgeon: [name], MD  Anesthesia: General with pectoral block  EBL: 40 cc  Drains:   Two 19 round drains  Specimen: Left breast to pathology  IV fluids: Per anesthesia record  Indications for procedure the patient is an 83 year old female with a history of right mastectomy 20 years ago for breast cancer and subsequent left breast lumpectomy 10 years ago for right breast cancer in the left.  She received adjuvant radiation radiation therapy at a time.  She presents with a new left breast cancer.  We discussed options and her only option was either medication alone or less simple mastectomy.  She opted for left simple mastectomy at this point time.The surgical and non surgical options have been discussed with the patient.  Risks of surgery include bleeding,  Infection,  Flap necrosis,  Tissue loss,  Chronic pain, death, Numbness,  And the need for additional procedures.  Reconstruction options also have been discussed with the patient as well.  The patient agrees to proceed.   Description of procedure: The patient was met in the holding area.  Left breast was marked as the correct side.  Pectoral block placed by anesthesia.  She was brought back to the operative room.  She is placed supine upon the operating room table.  After induction of general esthesia, the left breast was prepped and draped in sterile fashion.  Timeout was done.  She received appropriate preoperative antibiotics.  Curvilinear incision was made above below the nipple areolar complex.  Superior skin flap taken to the clavicle.  An inferior skin flap was taken down to the inferior mammary fold.  The breast was then dissected off the chest wall in a medial to lateral fashion to include the pectoralis fascia.  It was amputated at its lateral attachments lateral to the pectoralis minor.  This was sent to  pathology.  The axilla had been dissected out before.  ~More axillary tissue to make sure there are no residual nodes but I could not feel any adenopathy or any lymph nodes from for that fact.  Irrigation was used.  Hemostasis achieved.  19 round drains were placed through 2 separate stab incisions in the inferior flap and secured with 2-0 nylon.  3-0 Vicryl was then used to close the wound.  4 Monocryl is used to approximate the skin.  Dermabond applied.  All final counts found to be correct.  The patient was awoke extubated taken to recovery in satisfactory condition.

## 2018-08-19 NOTE — Anesthesia Preprocedure Evaluation (Addendum)
Anesthesia Evaluation  Patient identified by MRN, date of birth, ID band Patient awake    Reviewed: Allergy & Precautions, NPO status , Patient's Chart, lab work & pertinent test results  Airway Mallampati: II       Dental  (+) Teeth Intact   Pulmonary former smoker,    Pulmonary exam normal breath sounds clear to auscultation       Cardiovascular Normal cardiovascular exam Rhythm:Regular Rate:Normal     Neuro/Psych negative psych ROS   GI/Hepatic   Endo/Other  negative endocrine ROS  Renal/GU   negative genitourinary   Musculoskeletal   Abdominal (+) + obese,   Peds  Hematology   Anesthesia Other Findings   Reproductive/Obstetrics                            Anesthesia Physical Anesthesia Plan  ASA: II  Anesthesia Plan: General   Post-op Pain Management:    Induction: Intravenous  PONV Risk Score and Plan: 3 and Ondansetron and Dexamethasone  Airway Management Planned: LMA  Additional Equipment:   Intra-op Plan:   Post-operative Plan: Extubation in OR  Informed Consent: I have reviewed the patients History and Physical, chart, labs and discussed the procedure including the risks, benefits and alternatives for the proposed anesthesia with the patient or authorized representative who has indicated his/her understanding and acceptance.   Dental advisory given  Plan Discussed with: CRNA and Surgeon  Anesthesia Plan Comments:        Anesthesia Quick Evaluation

## 2018-08-19 NOTE — Progress Notes (Signed)
Pt had IV in right arm over summer for CT scan.    Pt stated no lymph nodes were removed during previous right mastectomy surgery.

## 2018-08-20 ENCOUNTER — Encounter (HOSPITAL_COMMUNITY): Payer: Self-pay | Admitting: Surgery

## 2018-08-20 DIAGNOSIS — C50912 Malignant neoplasm of unspecified site of left female breast: Secondary | ICD-10-CM | POA: Diagnosis not present

## 2018-08-20 LAB — BASIC METABOLIC PANEL
Anion gap: 9 (ref 5–15)
BUN: 18 mg/dL (ref 8–23)
CO2: 22 mmol/L (ref 22–32)
Calcium: 8.2 mg/dL — ABNORMAL LOW (ref 8.9–10.3)
Chloride: 106 mmol/L (ref 98–111)
Creatinine, Ser: 1.05 mg/dL — ABNORMAL HIGH (ref 0.44–1.00)
GFR calc Af Amer: 58 mL/min — ABNORMAL LOW (ref >60)
GFR calc non Af Amer: 50 mL/min — ABNORMAL LOW (ref >60)
Glucose, Bld: 167 mg/dL — ABNORMAL HIGH (ref 70–99)
Potassium: 4.4 mmol/L (ref 3.5–5.1)
Sodium: 137 mmol/L (ref 135–145)

## 2018-08-20 LAB — CBC
HCT: 35.7 % — ABNORMAL LOW (ref 36.0–46.0)
Hemoglobin: 11.7 g/dL — ABNORMAL LOW (ref 12.0–15.0)
MCH: 31 pg (ref 26.0–34.0)
MCHC: 32.8 g/dL (ref 30.0–36.0)
MCV: 94.7 fL (ref 80.0–100.0)
Platelets: 200 10*3/uL (ref 150–400)
RBC: 3.77 MIL/uL — ABNORMAL LOW (ref 3.87–5.11)
RDW: 12.5 % (ref 11.5–15.5)
WBC: 9.5 10*3/uL (ref 4.0–10.5)
nRBC: 0 % (ref 0.0–0.2)

## 2018-08-20 MED ORDER — OXYCODONE HCL 5 MG PO TABS: 5.0000 mg | ORAL_TABLET | ORAL | 0 refills | Status: DC | PRN

## 2018-08-20 NOTE — Discharge Summary (Signed)
Physician Discharge Summary  Patient ID: Monique Day MRN: [id number] DOB/AGE: [birth year] 83 y.o.  Admit date: 08/19/2018 Discharge date: 08/20/2018  Admission Diagnoses:left breast cancer stage 1   Discharge Diagnoses:  Active Problems:   Breast cancer, stage 1, left [facility])   Discharged Condition: good  Hospital Course: Pt did well post op.  She tolerated her diet, had good pain control, ambulated and had appropriate drainage from her drains.   Consults: None    Treatments: surgery: left mastectomy   Discharge Exam: Blood pressure 123/70, pulse 79, temperature 97.8 F (36.6 C), temperature source Oral, resp. rate 16, height 5' 2"  (1.575 m), weight 79.4 kg, SpO2 99 %. General appearance: alert and cooperative Head: Normocephalic, without obvious abnormality, atraumatic Resp: clear to auscultation bilaterally Cardio: regular rate and rhythm, S1, S2 normal, no murmur, click, rub or gallop Incision/Wound:incision CDI no hematoma  Flaps viable   Disposition: Discharge disposition: 01-Home or Self Care       Discharge Instructions    Diet - low sodium heart healthy   Complete by:  As directed    Increase activity slowly   Complete by:  As directed      Allergies as of 08/20/2018      Reactions   Mercaptopurine Other (See Comments)   Dropped WBC   Sulfa Antibiotics Other (See Comments)   Extreme Weakness      Medication List    TAKE these medications   acetaminophen 500 MG tablet Commonly known as:  TYLENOL Take 500 mg by mouth daily as needed for moderate pain or headache.   anastrozole 1 MG tablet Commonly known as:  ARIMIDEX Take 1 tablet (1 mg total) by mouth daily.   apixaban 5 MG Tabs tablet Commonly known as:  ELIQUIS Take 2 tablets (10 mg total) by mouth 2 (two) times daily. Then 1 tablet (5 mg) two times daily starting 03/16/18 What changed:    how much to take  additional instructions   CALCIUM 500 + D3 500-600 MG-UNIT Tabs Generic drug:   Calcium Carb-Cholecalciferol Take 2 tablets by mouth daily.   denosumab 60 MG/ML Soln injection Commonly known as:  PROLIA Inject 60 mg into the skin every 6 (six) months. Administer in upper arm, thigh, or abdomen   HUMIRA PEN 40 MG/0.8ML Pnkt Generic drug:  Adalimumab INJECT 40 MG SUBQ EVERY 14 DAYS. What changed:  See the new instructions.   Magnesium 500 MG Tabs Take 500 mg by mouth daily.   mesalamine 0.375 g 24 hr capsule Commonly known as:  APRISO Take 4 capsules by mouth each morning.   OPCON-A 0.027-0.315 % Soln Generic drug:  Naphazoline-Pheniramine Place 1 drop into both eyes daily as needed (dry eyes).   oxyCODONE 5 MG immediate release tablet Commonly known as:  Oxy IR/ROXICODONE Take 1-2 tablets (5-10 mg total) by mouth every 4 (four) hours as needed for moderate pain.   PRESCRIPTION MEDICATION Apply 1 application topically as needed (eczema). Ketoconazole-fluticasone 1:1 compounded cream   vitamin C 1000 MG tablet Take 1,000 mg by mouth daily.        Signed: Joyice Faster Tanmay Halteman 08/20/2018, 8:22 AM

## 2018-08-20 NOTE — Discharge Instructions (Signed)
[facility] [place] [facility], [place] [id number]  MASTECTOMY: POST OP INSTRUCTIONS  Always review your discharge instruction sheet given to you by the facility where your surgery was performed. IF YOU HAVE DISABILITY OR FAMILY LEAVE FORMS, YOU MUST BRING THEM TO THE OFFICE FOR PROCESSING.   DO NOT GIVE THEM TO YOUR DOCTOR. A prescription for pain medication may be given to you upon discharge.  Take your pain medication as prescribed, if needed.  If narcotic pain medicine is not needed, then you may take acetaminophen (Tylenol) or ibuprofen (Advil) as needed. 1. Take your usually prescribed medications unless otherwise directed. 2. If you need a refill on your pain medication, please contact your pharmacy.  They will contact our office to request authorization.  Prescriptions will not be filled after 5pm or on week-ends. 3. You should follow a light diet the first few days after arrival home, such as soup and crackers, etc.  Resume your normal diet the day after surgery. 4. Most patients will experience some swelling and bruising on the chest and underarm.  Ice packs will help.  Swelling and bruising can take several days to resolve.  5. It is common to experience some constipation if taking pain medication after surgery.  Increasing fluid intake and taking a stool softener (such as Colace) will usually help or prevent this problem from occurring.  A mild laxative (Milk of Magnesia or Miralax) should be taken according to package instructions if there are no bowel movements after 48 hours. 6. Unless discharge instructions indicate otherwise, leave your bandage dry and in place until your next appointment in 3-5 days.  You may take a limited sponge bath.  No tube baths or showers until the drains are removed.  You may have steri-strips (small skin tapes) in place directly over the incision.  These strips should be left on the skin for 7-10 days.  If your surgeon used skin glue on the incision, you may  shower in 24 hours.  The glue will flake off over the next 2-3 weeks.  Any sutures or staples will be removed at the office during your follow-up visit. 7. DRAINS:  If you have drains in place, it is important to keep a list of the amount of drainage produced each day in your drains.  Before leaving the hospital, you should be instructed on drain care.  Call our office if you have any questions about your drains. 8. ACTIVITIES:  You may resume regular (light) daily activities beginning the next day--such as daily self-care, walking, climbing stairs--gradually increasing activities as tolerated.  You may have sexual intercourse when it is comfortable.  Refrain from any heavy lifting or straining until approved by your doctor. a. You may drive when you are no longer taking prescription pain medication, you can comfortably wear a seatbelt, and you can safely maneuver your car and apply brakes. b. RETURN TO WORK:  __________________________________________________________ 9. You should see your doctor in the office for a follow-up appointment approximately 3-5 days after your surgery.  Your doctors nurse will typically make your follow-up appointment when she calls you with your pathology report.  Expect your pathology report 2-3 business days after your surgery.  You may call to check if you do not hear from [place] after three days.   10. OTHER INSTRUCTIONS: ______________________________________________________________________________________________ ____________________________________________________________________________________________ WHEN TO CALL YOUR DOCTOR: 1. Fever over 101.0 2. Nausea and/or vomiting 3. Extreme swelling or bruising 4. Continued bleeding from incision. 5. Increased pain, redness, or drainage from the incision.  The clinic staff is available to answer your questions during regular business hours.  Please dont hesitate to call and ask to speak to one of the nurses for clinical  concerns.  If you have a medical emergency, go to the nearest emergency room or call 911.  A surgeon from [facility] is always on call at the hospital. [address], [place], [place], [place]  [id number] ? [address], [place], [place]   [address] [phone] ? [phone] ? FAX [phone] Web site: [place] Surgical drains are used to remove extra fluid that normally builds up in a surgical wound after surgery. A surgical drain helps to heal a surgical wound. Different kinds of surgical drains include:  Active drains. These drains use suction to pull drainage away from the surgical wound. Drainage flows through a tube to a container outside of the body. It is important to keep the bulb or the drainage container flat (compressed) at all times, except while you empty it. Flattening the bulb or container creates suction. The two most common types of active drains are bulb drains and Hemovac drains.  Passive drains. These drains allow fluid to drain naturally, by gravity. Drainage flows through a tube to a bandage (dressing) or a container outside of the body. Passive drains do not need to be emptied. The most common type of passive drain is the Penrose drain. A drain is placed during surgery. Immediately after surgery, drainage is usually bright red and a little thicker than water. The drainage may gradually turn yellow or pink and become thinner. It is likely that your health care provider will remove the drain when the drainage stops or when the amount decreases to 1-2 Tbsp (15-30 mL) during a 24-hour period. How to care for your surgical drain It is important to care for your drain to prevent infection. If your drain is placed at your back, or any other hard-to-reach area, ask another person to assist you in performing the following steps:  Keep the skin around the drain dry and covered with a dressing at all times.  Check your drain area every  day for signs of infection. Check for: ? More redness, swelling, or pain. ? Pus or a bad smell. ? Cloudy drainage. Follow instructions from your health care provider about how to take care of your drain and how to change your dressing. Change your dressing at least one time every day. Change it more often if needed to keep the dressing dry. Make sure you: 1. Gather your supplies, including: ? Tape. ? Germ-free cleaning solution (sterile saline). ? Split gauze drain sponge: 4 x 4 inches (10 x 10 cm). ? Gauze square: 4 x 4 inches (10 x 10 cm). 2. Wash your hands with soap and water before you change your dressing. If soap and water are not available, use hand sanitizer. 3. Remove the old dressing. Avoid using scissors to do that. 4. Use sterile saline to clean your skin around the drain. 5. Place the tube through the slit in a drain sponge. Place the drain sponge so that it covers your wound. 6. Place the gauze square or another drain sponge on top of the drain sponge that is on the wound. Make sure the tube is between those layers. 7. Tape the dressing to your skin. 8. If you have an active bulb or Hemovac drain, tape the drainage tube to your skin 1-2 inches (2.5-5 cm) below the place where  the tube enters your body. Taping keeps the tube from pulling on any stitches (sutures) that you have. 9. Wash your hands with soap and water. 10. Write down the color of your drainage and how often you change your dressing. How to empty your active bulb or Hemovac drain  1. Make sure that you have a measuring cup that you can empty your drainage into. 2. Wash your hands with soap and water. If soap and water are not available, use hand sanitizer. 3. Gently move your fingers down the tube while squeezing very lightly. This is called stripping the tube. This clears any drainage, clots, or tissue from the tube. ? Do not pull on the tube. ? You may need to strip the tube several times every day to keep the  tube clear. 4. Open the bulb cap or the drain plug. Do not touch the inside of the cap or the bottom of the plug. 5. Empty all of the drainage into the measuring cup. 6. Compress the bulb or the container and replace the cap or the plug. To compress the bulb or the container, squeeze it firmly in the middle while you close the cap or plug the container. 7. Write down the amount of drainage that you have in each 24-hour period. If you have less than 2 Tbsp (30 mL) of drainage during 24 hours, contact your health care provider. 8. Flush the drainage down the toilet. 9. Wash your hands with soap and water. Contact a health care provider if:  You have more redness, swelling, or pain around your drain area.  The amount of drainage that you have is increasing instead of decreasing.  You have pus or a bad smell coming from your drain area.  You have a fever.  You have drainage that is cloudy.  There is a sudden stop or a sudden decrease in the amount of drainage that you have.  Your tube falls out.  Your active draindoes not stay compressedafter you empty it. Summary  Surgical drains are used to remove extra fluid that normally builds up in a surgical wound after surgery.  Different kinds of surgical drains include active drains and passive drains. Active drains use suction to pull drainage away from the surgical wound, and passive drains allow fluid to drain naturally.  It is important to care for your drain to prevent infection. If your drain is placed at your back, or any other hard-to-reach area, ask another person to assist you.  Contact your health care provider if you have more redness, swelling, or pain around your drain area. This information is not intended to replace advice given to you by your health care provider. Make sure you discuss any questions you have with your health care provider. Document Released: 08/01/2000 Document Revised: 08/27/2017 Document Reviewed:  02/21/2015 Elsevier Interactive Patient Education  2019 [employer].

## 2018-08-20 NOTE — Plan of Care (Signed)
  Problem: Education: Goal: Knowledge of disease or condition will improve Outcome: Progressing   Problem: Activity: Goal: Ability to maintain or regain function will improve Outcome: Progressing   Problem: Clinical Measurements: Goal: Postoperative complications will be avoided or minimized Outcome: Progressing

## 2018-08-20 NOTE — Progress Notes (Signed)
Monique Day to be D/C'd  per MD order. Discussed with the patient and all questions fully answered.  VSS, Skin clean, dry and intact without evidence of skin break down, no evidence of skin tears noted.  IV catheter discontinued intact. Site without signs and symptoms of complications. Dressing and pressure applied.  An After Visit Summary was printed and given to the patient. Patient received prescription.  D/c education completed with patient/family including follow up instructions, medication list, d/c activities limitations if indicated, with other d/c instructions as indicated by MD - patient able to verbalize understanding, all questions fully answered.   Patient instructed to return to ED, call 911, or call MD for any changes in condition.   Patient to be escorted via [place], and D/C home via private auto.

## 2018-09-20 ENCOUNTER — Other Ambulatory Visit (HOSPITAL_COMMUNITY): Payer: Self-pay | Admitting: *Deleted

## 2018-09-20 DIAGNOSIS — C50212 Malignant neoplasm of upper-inner quadrant of left female breast: Secondary | ICD-10-CM

## 2018-09-20 DIAGNOSIS — Z170 Estrogen receptor positive status [ER+]: Principal | ICD-10-CM

## 2018-09-21 ENCOUNTER — Encounter (INDEPENDENT_AMBULATORY_CARE_PROVIDER_SITE_OTHER): Payer: Self-pay | Admitting: Internal Medicine

## 2018-09-21 ENCOUNTER — Inpatient Hospital Stay (HOSPITAL_COMMUNITY): Payer: Medicare Other | Attending: Hematology

## 2018-09-21 ENCOUNTER — Ambulatory Visit (INDEPENDENT_AMBULATORY_CARE_PROVIDER_SITE_OTHER): Payer: Medicare Other | Admitting: Internal Medicine

## 2018-09-21 VITALS — BP 151/71 | HR 87 | Temp 98.4°F | Resp 18 | Ht 63.0 in | Wt 171.5 lb

## 2018-09-21 DIAGNOSIS — Z87891 Personal history of nicotine dependence: Secondary | ICD-10-CM | POA: Insufficient documentation

## 2018-09-21 DIAGNOSIS — Z170 Estrogen receptor positive status [ER+]: Secondary | ICD-10-CM | POA: Insufficient documentation

## 2018-09-21 DIAGNOSIS — K5900 Constipation, unspecified: Secondary | ICD-10-CM

## 2018-09-21 DIAGNOSIS — Z853 Personal history of malignant neoplasm of breast: Secondary | ICD-10-CM | POA: Insufficient documentation

## 2018-09-21 DIAGNOSIS — K5190 Ulcerative colitis, unspecified, without complications: Secondary | ICD-10-CM | POA: Diagnosis not present

## 2018-09-21 DIAGNOSIS — Z86718 Personal history of other venous thrombosis and embolism: Secondary | ICD-10-CM | POA: Insufficient documentation

## 2018-09-21 DIAGNOSIS — C50212 Malignant neoplasm of upper-inner quadrant of left female breast: Secondary | ICD-10-CM | POA: Insufficient documentation

## 2018-09-21 DIAGNOSIS — M810 Age-related osteoporosis without current pathological fracture: Secondary | ICD-10-CM | POA: Insufficient documentation

## 2018-09-21 DIAGNOSIS — Z8042 Family history of malignant neoplasm of prostate: Secondary | ICD-10-CM | POA: Diagnosis not present

## 2018-09-21 DIAGNOSIS — Z79899 Other long term (current) drug therapy: Secondary | ICD-10-CM | POA: Diagnosis not present

## 2018-09-21 DIAGNOSIS — Z7901 Long term (current) use of anticoagulants: Secondary | ICD-10-CM | POA: Diagnosis not present

## 2018-09-21 DIAGNOSIS — K5100 Ulcerative (chronic) pancolitis without complications: Secondary | ICD-10-CM | POA: Diagnosis not present

## 2018-09-21 DIAGNOSIS — Z79811 Long term (current) use of aromatase inhibitors: Secondary | ICD-10-CM | POA: Diagnosis not present

## 2018-09-21 LAB — CBC WITH DIFFERENTIAL/PLATELET
Abs Immature Granulocytes: 0.02 10*3/uL (ref 0.00–0.07)
Basophils Absolute: 0 10*3/uL (ref 0.0–0.1)
Basophils Relative: 1 %
Eosinophils Absolute: 0.1 10*3/uL (ref 0.0–0.5)
Eosinophils Relative: 2 %
HCT: 40.4 % (ref 36.0–46.0)
Hemoglobin: 12.6 g/dL (ref 12.0–15.0)
Immature Granulocytes: 0 %
Lymphocytes Relative: 33 %
Lymphs Abs: 2.4 10*3/uL (ref 0.7–4.0)
MCH: 30.4 pg (ref 26.0–34.0)
MCHC: 31.2 g/dL (ref 30.0–36.0)
MCV: 97.6 fL (ref 80.0–100.0)
Monocytes Absolute: 0.6 10*3/uL (ref 0.1–1.0)
Monocytes Relative: 8 %
Neutro Abs: 4.1 10*3/uL (ref 1.7–7.7)
Neutrophils Relative %: 56 %
Platelets: 298 10*3/uL (ref 150–400)
RBC: 4.14 MIL/uL (ref 3.87–5.11)
RDW: 12.4 % (ref 11.5–15.5)
WBC: 7.2 10*3/uL (ref 4.0–10.5)
nRBC: 0 % (ref 0.0–0.2)

## 2018-09-21 LAB — COMPREHENSIVE METABOLIC PANEL
ALT: 17 U/L (ref 0–44)
AST: 18 U/L (ref 15–41)
Albumin: 3.9 g/dL (ref 3.5–5.0)
Alkaline Phosphatase: 28 U/L — ABNORMAL LOW (ref 38–126)
Anion gap: 11 (ref 5–15)
BUN: 23 mg/dL (ref 8–23)
CO2: 26 mmol/L (ref 22–32)
Calcium: 9.1 mg/dL (ref 8.9–10.3)
Chloride: 101 mmol/L (ref 98–111)
Creatinine, Ser: 1.06 mg/dL — ABNORMAL HIGH (ref 0.44–1.00)
GFR calc Af Amer: 57 mL/min — ABNORMAL LOW (ref >60)
GFR calc non Af Amer: 50 mL/min — ABNORMAL LOW (ref >60)
Glucose, Bld: 104 mg/dL — ABNORMAL HIGH (ref 70–99)
Potassium: 4.7 mmol/L (ref 3.5–5.1)
Sodium: 138 mmol/L (ref 135–145)
Total Bilirubin: 0.4 mg/dL (ref 0.3–1.2)
Total Protein: 7.6 g/dL (ref 6.5–8.1)

## 2018-09-21 MED ORDER — POLYETHYLENE GLYCOL 3350 17 GM/SCOOP PO POWD: 8.5000 g | Freq: Every day | ORAL | 0 refills | Status: DC | PRN

## 2018-09-21 NOTE — Patient Instructions (Signed)
Can titrate polyethylene glycol/MiraLAX dose to desired effect.  Can take it daily or every other day.

## 2018-09-21 NOTE — Progress Notes (Signed)
Presenting complaint;  Follow-up for ulcerative colitis.  Database and subjective:  Patient is 83 year old Caucasian female who has over 55-year history of ulcerative colitis who is here for scheduled visit.  She was last seen in July 2019.  Her last surveillance colonoscopy was in November 2017 and she was noted to be in endoscopic remission.  She had biopsy from what appeared to be a polyp but it was not an adenoma or dysplastic lesion.  Patient states she is doing well as far as her colitis is concerned.  She has 1 formed stool daily.  Occasionally she feels that she has constipation and wants to know if she can take polyethylene glycol.  She denies abdominal pain melena or rectal bleeding.  She has been watching her diet and she is pleased to find out that she has lost 5 pounds.  She is hoping to lose a few more pounds. She states she had left mastectomy on 08/19/2018 for breast carcinoma.  She states her first primary was 20 years ago also her left breast when she had lumpectomy and second primary was in the right breast 10 years ago.  She was begun on Arimidex but she develop emotional instability and she would be in tears.  She has stopped this medication.  She has an appointment to see Dr. Delton Coombes next week. She is also wondering if she could come off anticoagulant.  She will discuss this further with Dr. Julio Sicks her primary care physician.  Current Medications: Outpatient Encounter Medications as of 09/21/2018  Medication Sig  . acetaminophen (TYLENOL) 500 MG tablet Take 500 mg by mouth daily as needed for moderate pain or headache.  Marland Kitchen apixaban (ELIQUIS) 5 MG TABS tablet Take 2 tablets (10 mg total) by mouth 2 (two) times daily. Then 1 tablet (5 mg) two times daily starting 03/16/18 (Patient taking differently: Take 5 mg by mouth 2 (two) times daily. )  . Ascorbic Acid (VITAMIN C) 1000 MG tablet Take 1,000 mg by mouth daily.  Marland Kitchen denosumab (PROLIA) 60 MG/ML SOLN injection Inject 60 mg  into the skin every 6 (six) months. Administer in upper arm, thigh, or abdomen  . HUMIRA PEN 40 MG/0.8ML PNKT INJECT 40 MG SUBQ EVERY 14 DAYS. (Patient taking differently: Inject 40 mg as directed every 14 (fourteen) days. )  . Magnesium 500 MG TABS Take 500 mg by mouth daily.  . mesalamine (APRISO) 0.375 g 24 hr capsule Take 4 capsules by mouth each morning.  . Naphazoline-Pheniramine (OPCON-A) 0.027-0.315 % SOLN Place 1 drop into both eyes daily as needed (dry eyes).   Marland Kitchen PRESCRIPTION MEDICATION Apply 1 application topically as needed (eczema). Ketoconazole-fluticasone 1:1 compounded cream  . anastrozole (ARIMIDEX) 1 MG tablet Take 1 tablet (1 mg total) by mouth daily. (Patient not taking: Reported on 09/21/2018)  . Calcium Carb-Cholecalciferol (CALCIUM 500 + D3) 500-600 MG-UNIT TABS Take 2 tablets by mouth daily.  . [DISCONTINUED] oxyCODONE (OXY IR/ROXICODONE) 5 MG immediate release tablet Take 1-2 tablets (5-10 mg total) by mouth every 4 (four) hours as needed for moderate pain. (Patient not taking: Reported on 09/21/2018)   No facility-administered encounter medications on file as of 09/21/2018.      Objective: Blood pressure (!) 151/71, pulse 87, temperature 98.4 F (36.9 C), temperature source Oral, resp. rate 18, height 5' 3"  (1.6 m), weight 171 lb 8 oz (77.8 kg). Patient is alert and in no acute distress. Conjunctiva is pink. Sclera is nonicteric Oropharyngeal mucosa is normal. No neck masses or thyromegaly  noted. Cardiac exam with regular rhythm normal S1 and S2. No murmur or gallop noted. Lungs are clear to auscultation. Abdomen is full.  She has left lower paramedian scar.  Abdomen is soft and nontender with organomegaly or masses. No LE edema or clubbing noted.  Labs/studies Results:  CBC Latest Ref Rng & Units 08/20/2018 08/16/2018 03/08/2018  WBC 4.0 - 10.5 K/uL 9.5 8.1 10.6(H)  Hemoglobin 12.0 - 15.0 g/dL 11.7(L) 14.5 12.4  Hematocrit 36.0 - 46.0 % 35.7(L) 45.0 37.7  Platelets  150 - 400 K/uL 200 219 273    CMP Latest Ref Rng & Units 08/20/2018 08/16/2018 03/09/2018  Glucose 70 - 99 mg/dL 167(H) 96 111(H)  BUN 8 - 23 mg/dL 18 19 23   Creatinine 0.44 - 1.00 mg/dL 1.05(H) 1.02(H) 1.04(H)  Sodium 135 - 145 mmol/L 137 139 135  Potassium 3.5 - 5.1 mmol/L 4.4 3.9 3.6  Chloride 98 - 111 mmol/L 106 102 100  CO2 22 - 32 mmol/L 22 26 26   Calcium 8.9 - 10.3 mg/dL 8.2(L) 9.5 9.0  Total Protein 6.5 - 8.1 g/dL - 8.0 -  Total Bilirubin 0.3 - 1.2 mg/dL - 0.6 -  Alkaline Phos 38 - 126 U/L - 27(L) -  AST 15 - 41 U/L - 20 -  ALT 0 - 44 U/L - 15 -    Hepatic Function Latest Ref Rng & Units 08/16/2018 03/08/2018 03/10/2017  Total Protein 6.5 - 8.1 g/dL 8.0 8.0 7.1  Albumin 3.5 - 5.0 g/dL 4.2 3.6 4.0  AST 15 - 41 U/L 20 14(L) 18  ALT 0 - 44 U/L 15 11 12   Alk Phosphatase 38 - 126 U/L 27(L) 35(L) 34(L)  Total Bilirubin 0.3 - 1.2 mg/dL 0.6 0.6 0.3      Assessment:  #1.  Chronic ulcerative colitis.  She has had the disease for more than 50 years.  She had major relapse few years ago when she had massive GI bleed in the setting of anticoagulation at which time she had IVC filter placed.  Now she has been in remission for the past few years.  This is also documented at colonoscopy in November 2017.  She will continue adalimumab as long as it is working and she has no side effects.  She will also continue oral mesalamine. Next surveillance colonoscopy would be in November 2022.  #2.  Constipation.  She can use polyethylene glycol on as-needed basis. . Plan:  Patient will continue adalimumab and oral mesalamine at current dose.   Polyethylene glycol 8.5 g by mouth daily at bedtime or every other night.  She can titrate dose to desired results. Office visit in 6 months.

## 2018-10-01 ENCOUNTER — Encounter (HOSPITAL_COMMUNITY): Payer: Self-pay | Admitting: Hematology

## 2018-10-01 ENCOUNTER — Inpatient Hospital Stay (HOSPITAL_COMMUNITY): Payer: Medicare Other | Admitting: Hematology

## 2018-10-01 ENCOUNTER — Other Ambulatory Visit: Payer: Self-pay

## 2018-10-01 VITALS — BP 161/75 | HR 84 | Temp 97.8°F | Resp 16 | Wt 176.0 lb

## 2018-10-01 DIAGNOSIS — C50212 Malignant neoplasm of upper-inner quadrant of left female breast: Secondary | ICD-10-CM

## 2018-10-01 DIAGNOSIS — M810 Age-related osteoporosis without current pathological fracture: Secondary | ICD-10-CM | POA: Diagnosis not present

## 2018-10-01 DIAGNOSIS — Z86718 Personal history of other venous thrombosis and embolism: Secondary | ICD-10-CM

## 2018-10-01 DIAGNOSIS — Z170 Estrogen receptor positive status [ER+]: Secondary | ICD-10-CM | POA: Diagnosis not present

## 2018-10-01 DIAGNOSIS — Z87891 Personal history of nicotine dependence: Secondary | ICD-10-CM

## 2018-10-01 DIAGNOSIS — Z7901 Long term (current) use of anticoagulants: Secondary | ICD-10-CM

## 2018-10-01 DIAGNOSIS — Z853 Personal history of malignant neoplasm of breast: Secondary | ICD-10-CM

## 2018-10-01 DIAGNOSIS — Z79811 Long term (current) use of aromatase inhibitors: Secondary | ICD-10-CM | POA: Diagnosis not present

## 2018-10-01 DIAGNOSIS — Z79899 Other long term (current) drug therapy: Secondary | ICD-10-CM

## 2018-10-01 DIAGNOSIS — Z8042 Family history of malignant neoplasm of prostate: Secondary | ICD-10-CM

## 2018-10-01 DIAGNOSIS — K5190 Ulcerative colitis, unspecified, without complications: Secondary | ICD-10-CM

## 2018-10-01 MED ORDER — EXEMESTANE 25 MG PO TABS: 25.0000 mg | ORAL_TABLET | Freq: Every day | ORAL | 6 refills | Status: DC

## 2018-10-01 NOTE — Assessment & Plan Note (Signed)
1.  Stage I (PT1CNX) HER-2 positive left breast cancer: - Patient reportedly had an abnormal mammogram done at [facility], followed by attempted ultrasound-guided biopsy which was unsuccessful. - She was referred to  when she had a stereotactic biopsy on 06/29/2018, of the left breast irregular mass within the posterior upper, slightly inner quadrant.  A coil-shaped clip was placed. 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  Ki 67 was 10%. - Patient had a history of left breast cancer in 1998, status post lumpectomy, followed by chemotherapy and radiation, 1 year of tamoxifen which was switched to anastrozole for subsequent 4 years due to intolerance. - She had a history of right breast cancer in 2009, status post mastectomy.  She did not receive any adjuvant chemotherapy or radiation. - She was started on anastrozole on 07/21/2018, she took it for 2 weeks and felt very emotional.  She stopped taking it. -She underwent left mastectomy on 08/19/2018 which shows 1.1 cm invasive ductal carcinoma, grade 1, margins negative.  No lymph nodes were identified.  ER was 100% positive, PR 7% positive and HER-2 was 3+ positive.  Ki-67 was 2%. - I have called and talked to Dr. John Patrick (pathologist).  He clarified that there was an initial biopsy done on 06/23/2018, at [facility].  This report is not uploaded on epic.  The initial biopsy was reportedly positive for HER-2. -The second biopsy on 06/29/2018 was negative.  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 - The second focus was not clearly identified on the mastectomy specimen. - The 1.1 cm tumor identified on the mastectomy is positive for HER-2. -I have arranged for a echocardiogram.  NCCN guidelines recommend adjuvant chemotherapy with trastuzumab for tumors more than 1 cm.  I have not talked to her about the final interpretation of the report  as patient already left.  I will see her back in 1 week to discuss the results and my recommendation.  2.  Osteoporosis: - She gets DEXA scan done at [facility]. - She is on Prolia every 6 months at her PMDs office.  She reportedly got last injection last week.   -She will continue calcium and vitamin D twice daily.  3.  Recurrent DVT: - Right leg femoral and popliteal DVT, diagnosed on 03/08/2018.  She is currently on Eliquis. - She reported having leg swellings and decreased mobility prior to most recent DVT. - She had a history of DVT in 2000's, was treated on Coumadin until 5 years ago.  This was stopped secondary to a bleed complicated by her ulcerative colitis. - She was on a one-week bus trip to [place] when she first developed her blood clot.  4.  Ulcerative colitis: -She is currently on mesalamine and Humira.  Is well controlled. 

## 2018-10-01 NOTE — Progress Notes (Signed)
[place], [place] [id number]   CLINIC:  Medical Oncology/Hematology  PCP:  Octavio Graves, DO [address] [place] [address] [phone]   REASON FOR VISIT: Left breast cancer, HER-2 positive  CURRENT THERAPY: mastectomy scheduled for 08/19/18, will start Arimidex back 07/22/18   INTERVAL HISTORY:  Ms. Zaugg 83 y.o. female returns for routine follow-up for left breast cancer. She reports she only took the Arimidex for two week and stopped. She started having memory issues and forgetting things that have been habits for years. Denies any nausea, vomiting, or diarrhea. Denies any new pains. Had not noticed any recent bleeding such as epistaxis, hematuria or hematochezia. Denies recent chest pain on exertion, shortness of breath on minimal exertion, pre-syncopal episodes, or palpitations. Denies any numbness or tingling in hands or feet. Denies any recent fevers, infections, or recent hospitalizations. Patient reports appetite at 100% and energy level at 100%.    REVIEW OF SYSTEMS:  Review of Systems  All other systems reviewed and are negative.    PAST MEDICAL/SURGICAL HISTORY:  Past Medical History:  Diagnosis Date  . Adult idiopathic generalized osteoporosis   . Arthritis    "minor" (08/18/2018)  . Breast cancer, left breast ([place]) 1998   lumpectomy  . Breast cancer, right breast ([place]) 2009   lumpectomy; mastectomy  . DVT (deep venous thrombosis) ([place]) "early 2000's"   RLE  . DVT (deep venous thrombosis) ([place])   . Hemorrhoids, external   . History of blood transfusion    "related to ulcerative colitis"  . Lower abdominal pain   . Occult blood in stools   . Peripheral edema   . Seizures ([place])    one very slight seizure with the stroke; somewhere between 2005-2009  . Stroke [facility]) 2005-2009   "very light;" ; denies residual on 08/19/2018  . UC (ulcerative colitis) ([place]) dx'd 1601   Past Surgical History:  Procedure  Laterality Date  . APPENDECTOMY    . bone density  12/11/10  . BREAST BIOPSY Left 1998; 2019 X 2  . BREAST BIOPSY Right 2009  . BREAST LUMPECTOMY Left 1998  . CATARACT EXTRACTION W/ INTRAOCULAR LENS  IMPLANT, BILATERAL Bilateral   . COLONOSCOPY  02/21/2011  . COLONOSCOPY  10/06/2008  . COLONOSCOPY  12/27/01  . COLONOSCOPY  05/09/05  . COLONOSCOPY N/A 07/02/2016   Procedure: COLONOSCOPY;  Surgeon: [name], MD;  Location[facility];  Service: Endoscopy;  Laterality: N/A;  1055  . [place]   "I was having rectal bleeding"  . FRACTURE SURGERY    . MASTECTOMY Right 2009  . MASTECTOMY COMPLETE / SIMPLE Left 08/19/2018  . SIMPLE MASTECTOMY WITH AXILLARY SENTINEL NODE BIOPSY Left 08/19/2018   Procedure: LEFT SIMPLE MASTECTOMY;  Surgeon: [name], MD;  Location: [facility];  Service: General;  Laterality: Left;  . TONSILLECTOMY    . VENA CAVA FILTER PLACEMENT Right   . WRIST FRACTURE SURGERY Right      SOCIAL HISTORY:  Social History   Socioeconomic History  . Marital status: Widowed    Spouse name: Not on file  . Number of children: 0  . Years of education: Not on file  . Highest education level: Not on file  Occupational History  . Occupation: Optometrist: [employer]  Social Needs  . Financial resource strain: Not hard at all  . Food insecurity:    Worry: Never true  Inability: Never true  . Transportation needs:    Medical: No    Non-medical: No  Tobacco Use  . Smoking status: Former Smoker    Packs/day: 2.00    Years: 40.00    Pack years: 80.00    Types: Cigarettes    Last attempt to quit: 05/05/2001    Years since quitting: 17.4  . Smokeless tobacco: Never Used  Substance and Sexual Activity  . Alcohol use: Yes    Comment: 08/18/2018 "couple drinks/year"  . Drug use: Never  . Sexual activity: Not Currently  Lifestyle  . Physical activity:    Days per week: 0 days    Minutes per session: 0 min   . Stress: Not at all  Relationships  . Social connections:    Talks on phone: More than three times a week    Gets together: More than three times a week    Attends religious service: More than 4 times per year    Active member of club or organization: Yes    Attends meetings of clubs or organizations: More than 4 times per year    Relationship status: Widowed  . Intimate partner violence:    Fear of current or ex partner: No    Emotionally abused: No    Physically abused: No    Forced sexual activity: No  Other Topics Concern  . Not on file  Social History Narrative  . Not on file    FAMILY HISTORY:  Family History  Problem Relation Age of Onset  . Prostate cancer Father   . Colon cancer Neg Hx     CURRENT MEDICATIONS:  Outpatient Encounter Medications as of 10/01/2018  Medication Sig Note  . apixaban (ELIQUIS) 5 MG TABS tablet Take 2 tablets (10 mg total) by mouth 2 (two) times daily. Then 1 tablet (5 mg) two times daily starting 03/16/18 (Patient taking differently: Take 2.5 mg by mouth 2 (two) times daily. ) 08/20/2018: Restart on Sunday   . Ascorbic Acid (VITAMIN C) 1000 MG tablet Take 1,000 mg by mouth daily.   . Calcium Carb-Cholecalciferol (CALCIUM 500 + D3) 500-600 MG-UNIT TABS Take 2 tablets by mouth daily. 09/21/2018: Currently holding  . denosumab (PROLIA) 60 MG/ML SOLN injection Inject 60 mg into the skin every 6 (six) months. Administer in upper arm, thigh, or abdomen 08/03/2018: Last dose 06/2018  . HUMIRA PEN 40 MG/0.8ML PNKT INJECT 40 MG SUBQ EVERY 14 DAYS. (Patient taking differently: Inject 40 mg as directed every 14 (fourteen) days. )   . Magnesium 500 MG TABS Take 500 mg by mouth daily.   . mesalamine (APRISO) 0.375 g 24 hr capsule Take 4 capsules by mouth each morning.   . Naphazoline-Pheniramine (OPCON-A) 0.027-0.315 % SOLN Place 1 drop into both eyes daily as needed (dry eyes).  09/21/2018: As needed , rare per patient  . polyethylene glycol powder  (GLYCOLAX/MIRALAX) powder Take 8.5 g by mouth daily as needed.   Marland Kitchen PRESCRIPTION MEDICATION Apply 1 application topically as needed (eczema). Ketoconazole-fluticasone 1:1 compounded cream 08/03/2018: Compounded at [facility]  . acetaminophen (TYLENOL) 500 MG tablet Take 500 mg by mouth daily as needed for moderate pain or headache.   . anastrozole (ARIMIDEX) 1 MG tablet Take 1 tablet (1 mg total) by mouth daily. (Patient not taking: Reported on 09/21/2018) 09/21/2018: Currently holding this medication.  Marland Kitchen exemestane (AROMASIN) 25 MG tablet Take 1 tablet (25 mg total) by mouth daily after breakfast.    No facility-administered encounter medications on file  as of 10/01/2018.     ALLERGIES:  Allergies  Allergen Reactions  . Mercaptopurine Other (See Comments)    Dropped WBC  . Sulfa Antibiotics Other (See Comments)    Extreme Weakness     PHYSICAL EXAM:  ECOG Performance status: 1  Vitals:   10/01/18 0800  BP: (!) 161/75  Pulse: 84  Resp: 16  Temp: 97.8 F (36.6 C)  SpO2: 100%   Filed Weights   10/01/18 0800  Weight: 176 lb (79.8 kg)    Physical Exam Constitutional:      Appearance: Normal appearance. She is normal weight.  Cardiovascular:     Rate and Rhythm: Normal rate and regular rhythm.     Heart sounds: Normal heart sounds.  Pulmonary:     Effort: Pulmonary effort is normal.     Breath sounds: Normal breath sounds.  Musculoskeletal: Normal range of motion.  Skin:    General: Skin is warm and dry.  Neurological:     Mental Status: She is alert and oriented to person, place, and time. Mental status is at baseline.  Psychiatric:        Mood and Affect: Mood normal.        Behavior: Behavior normal.        Thought Content: Thought content normal.        Judgment: Judgment normal.   Breast: No palpable masses, no skin changes, and no adenopathy.  Bilateral mastectomy sites within normal limits.  Left mastectomy site is well-healed.   LABORATORY DATA:  I have reviewed  the labs as listed.  CBC    Component Value Date/Time   WBC 7.2 09/21/2018 1139   RBC 4.14 09/21/2018 1139   HGB 12.6 09/21/2018 1139   HGB 14.4 03/10/2017 1254   HCT 40.4 09/21/2018 1139   HCT 41.5 03/10/2017 1254   PLT 298 09/21/2018 1139   PLT 249 03/10/2017 1254   MCV 97.6 09/21/2018 1139   MCV 91 03/10/2017 1254   MCH 30.4 09/21/2018 1139   MCHC 31.2 09/21/2018 1139   RDW 12.4 09/21/2018 1139   RDW 13.3 03/10/2017 1254   LYMPHSABS 2.4 09/21/2018 1139   LYMPHSABS 2.7 03/10/2017 1254   MONOABS 0.6 09/21/2018 1139   EOSABS 0.1 09/21/2018 1139   EOSABS 0.4 03/10/2017 1254   BASOSABS 0.0 09/21/2018 1139   BASOSABS 0.1 03/10/2017 1254   CMP Latest Ref Rng & Units 09/21/2018 08/20/2018 08/16/2018  Glucose 70 - 99 mg/dL 104(H) 167(H) 96  BUN 8 - 23 mg/dL _0 Creatinine 0.44 - 1.00 mg/dL 1.06(H) 1.05(H) 1.02(H)  Sodium 135 - 145 mmol/L 138 137 139  Potassium 3.5 - 5.1 mmol/L 4.7 4.4 3.9  Chloride 98 - 111 mmol/L 101 106 102  CO2 22 - 32 mmol/L _1 Calcium 8.9 - 10.3 mg/dL 9.1 8.2(L) 9.5  Total Protein 6.5 - 8.1 g/dL 7.6 - 8.0  Total Bilirubin 0.3 - 1.2 mg/dL 0.4 - 0.6  Alkaline Phos 38 - 126 U/L 28(L) - 27(L)  AST 15 - 41 U/L 18 - 20  ALT 0 - 44 U/L 17 - 15       DIAGNOSTIC IMAGING:  I have independently reviewed the scans and discussed with the patient.   I have reviewed Francene Finders, NP's note and agree with the documentation.  I personally performed a face-to-face visit, made revisions and my assessment and plan is as follows.    ASSESSMENT & PLAN:   Breast cancer of upper-inner quadrant  of left female breast ([place]) 1.  Stage I (PT1CNX) HER-2 positive left breast cancer: - Patient reportedly had an abnormal mammogram done at [facility], followed by attempted ultrasound-guided biopsy which was unsuccessful. - She was referred to [facility] when she had a stereotactic biopsy on 06/29/2018, of the left breast irregular mass within the posterior upper, slightly  inner quadrant.  A coil-shaped clip was placed. 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  Ki 67 was 10%. - Patient had a history of left breast cancer in 1998, status post lumpectomy, followed by chemotherapy and radiation, 1 year of tamoxifen which was switched to anastrozole for subsequent 4 years due to intolerance. - She had a history of right breast cancer in 2009, status post mastectomy.  She did not receive any adjuvant chemotherapy or radiation. - She was started on anastrozole on 07/21/2018, she took it for 2 weeks and felt very emotional.  She stopped taking it. -She underwent left mastectomy on 08/19/2018 which shows 1.1 cm invasive ductal carcinoma, grade 1, margins negative.  No lymph nodes were identified.  ER was 100% positive, PR 7% positive and HER-2 was 3+ positive.  Ki-67 was 2%. - I have called and talked to Dr. Claudette Laws (pathologist).  He clarified that there was an initial biopsy done on 06/23/2018, at [facility].  This report is not uploaded on epic.  The initial biopsy was reportedly positive for HER-2. -The second biopsy on 06/29/2018 was negative.  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 - The second focus was not clearly identified on the mastectomy specimen. - The 1.1 cm tumor identified on the mastectomy is positive for HER-2. -I have arranged for a echocardiogram.  NCCN guidelines recommend adjuvant chemotherapy with trastuzumab for tumors more than 1 cm.  I have not talked to her about the final interpretation of the report as patient already left.  I will see her back in 1 week to discuss the results and my recommendation.  2.  Osteoporosis: - She gets DEXA scan done at [facility]. - She is on Prolia every 6 months at her PMDs office.  She reportedly got last injection last week.   -She will continue calcium and vitamin D twice daily.  3.  Recurrent DVT: -  Right leg femoral and popliteal DVT, diagnosed on 03/08/2018.  She is currently on Eliquis. - She reported having leg swellings and decreased mobility prior to most recent DVT. - She had a history of DVT in 2000's, was treated on Coumadin until 5 years ago.  This was stopped secondary to a bleed complicated by her ulcerative colitis. - She was on a one-week bus trip to [place] when she first developed her blood clot.  4.  Ulcerative colitis: -She is currently on mesalamine and Humira.  Is well controlled.      Orders placed this encounter:  Orders Placed This Encounter  Procedures  . CBC with Differential/Platelet  . Comprehensive metabolic panel  . ECHOCARDIOGRAM COMPLETE      Derek Jack, [place] [phone]

## 2018-10-01 NOTE — Patient Instructions (Signed)
[facility] Discharge Instructions     Thank you for choosing [facility] at [facility] to provide your oncology and hematology care.  To afford each patient quality time with our provider, please arrive at least 15 minutes before your scheduled appointment time.   If you have a lab appointment with the Cancer Center please come in thru the  Main Entrance and check in at the main information desk  You need to re-schedule your appointment should you arrive 10 or more minutes late.  We strive to give you quality time with our providers, and arriving late affects you and other patients whose appointments are after yours.  Also, if you no show three or more times for appointments you may be dismissed from the clinic at the providers discretion.     Again, thank you for choosing [facility].  Our hope is that these requests will decrease the amount of time that you wait before being seen by our physicians.       _____________________________________________________________  Should you have questions after your visit to [facility], please contact our office at [phone] between the hours of 8:00 a.m. and 4:30 p.m.  Voicemails left after 4:00 p.m. will not be returned until the following business day.  For prescription refill requests, have your pharmacy contact our office and allow 72 hours.    Cancer Center Support Programs:   > Cancer Support Group  2nd Tuesday of the month 1pm-2pm, Journey Room    

## 2018-10-04 ENCOUNTER — Other Ambulatory Visit: Payer: Self-pay

## 2018-10-04 ENCOUNTER — Encounter (HOSPITAL_COMMUNITY): Payer: Self-pay | Admitting: Hematology

## 2018-10-04 ENCOUNTER — Inpatient Hospital Stay (HOSPITAL_COMMUNITY): Payer: Medicare Other | Admitting: Hematology

## 2018-10-04 DIAGNOSIS — Z86718 Personal history of other venous thrombosis and embolism: Secondary | ICD-10-CM

## 2018-10-04 DIAGNOSIS — Z170 Estrogen receptor positive status [ER+]: Secondary | ICD-10-CM

## 2018-10-04 DIAGNOSIS — Z87891 Personal history of nicotine dependence: Secondary | ICD-10-CM

## 2018-10-04 DIAGNOSIS — K5190 Ulcerative colitis, unspecified, without complications: Secondary | ICD-10-CM

## 2018-10-04 DIAGNOSIS — Z853 Personal history of malignant neoplasm of breast: Secondary | ICD-10-CM

## 2018-10-04 DIAGNOSIS — M810 Age-related osteoporosis without current pathological fracture: Secondary | ICD-10-CM | POA: Diagnosis not present

## 2018-10-04 DIAGNOSIS — Z79811 Long term (current) use of aromatase inhibitors: Secondary | ICD-10-CM | POA: Diagnosis not present

## 2018-10-04 DIAGNOSIS — C50212 Malignant neoplasm of upper-inner quadrant of left female breast: Secondary | ICD-10-CM

## 2018-10-04 DIAGNOSIS — Z79899 Other long term (current) drug therapy: Secondary | ICD-10-CM

## 2018-10-04 DIAGNOSIS — Z7901 Long term (current) use of anticoagulants: Secondary | ICD-10-CM

## 2018-10-04 DIAGNOSIS — Z8042 Family history of malignant neoplasm of prostate: Secondary | ICD-10-CM

## 2018-10-04 MED ORDER — PROCHLORPERAZINE MALEATE 10 MG PO TABS: 10.0000 mg | ORAL_TABLET | Freq: Four times a day (QID) | ORAL | 1 refills | Status: DC | PRN

## 2018-10-04 MED ORDER — LIDOCAINE-PRILOCAINE 2.5-2.5 % EX CREA: TOPICAL_CREAM | CUTANEOUS | 3 refills | Status: DC

## 2018-10-04 NOTE — Progress Notes (Signed)
START ON PATHWAY REGIMEN - Breast   Paclitaxel Weekly + Trastuzumab Weekly:   Administer weekly:     Paclitaxel      Trastuzumab-xxxx      Trastuzumab-xxxx   **Always confirm dose/schedule in your pharmacy ordering system**  Trastuzumab (Maintenance - NO Loading Dose):   A cycle is every 21 days:     Trastuzumab-xxxx   **Always confirm dose/schedule in your pharmacy ordering system**  Patient Characteristics: Postoperative without Neoadjuvant Therapy (Pathologic Staging), Invasive Disease, Adjuvant Therapy, HER2 Positive, ER Positive, Node Negative, pT1c, pN0/N68m Therapeutic Status: Postoperative without Neoadjuvant Therapy (Pathologic Staging) AJCC Grade: G3 AJCC N Category: pNX AJCC M Category: cM0 ER Status: Positive (+) AJCC 8 Stage Grouping: Unknown HER2 Status: Positive (+) Oncotype Dx Recurrence Score: Not Appropriate AJCC T Category: pT1c PR Status: Positive (+) Intent of Therapy: Curative Intent, Discussed with Patient

## 2018-10-04 NOTE — Progress Notes (Signed)
Oncology Navigator Note: I met with patient during office visit today. I provided written information on taxol and herceptin, the two medications offered to her for treatment.  Patient was asked to read over it and if she has any questions to give me a call or bring those questions with her to her chemotherapy teaching class.  I gave the patient my contact information.  I explained my role in her care and to not hesitate to give me a call should she have any questions.

## 2018-10-04 NOTE — Assessment & Plan Note (Addendum)
1.  Stage I (PT1CNX) HER-2 positive left breast cancer: - Abnormal mammogram at [facility], followed by ultrasound-guided biopsy on 06/23/2018.  IDC, HER-2 positive, ER/PR positive, Ki 67 2%. - Stereotactic biopsy in [place] on 06/29/2018, of the left breast irregular mass within the posterior upper, slightly inner quadrant.  A coil-shaped clip was placed. 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  Ki 67 was 10%. - Patient had a history of left breast cancer in 1998, status post lumpectomy, followed by chemotherapy and radiation, 1 year of tamoxifen which was switched to anastrozole for subsequent 4 years due to intolerance. - She had a history of right breast cancer in 2009, status post mastectomy.  She did not receive any adjuvant chemotherapy or radiation. - She was started on anastrozole on 07/21/2018, she took it for 2 weeks and felt very emotional.  She stopped taking it. -Left mastectomy on 08/19/2018, 1.1 cm IDC, grade 1, margins negative.  No lymph nodes were identified.,  ER was 100% positive, PR 7% positive and HER-2 3+ positive.  - I have called and talked to Dr. Claudette Laws (pathologist).  He clarified that there was an initial biopsy done on 06/23/2018, at [facility].  This report is not uploaded on epic.  The initial biopsy was reportedly positive for HER-2. -The second biopsy on 06/29/2018 was negative for HER-2.  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 - The second focus was not clearly identified on the mastectomy specimen. - I have recommended adjuvant chemotherapy with weekly paclitaxel for 12 cycles and Herceptin for 1 year.  We talked about the side effects of this regimen in detail.  She has a 2D echocardiogram scheduled next Thursday. - She is agreeable to try adjuvant chemotherapy.  We can always start paclitaxel if she develops significant adverse effects. - She has an  appointment to see Dr. Arnoldo Morale next week for port placement. -At last visit we have prescribed her Aromasin.  She took it for 2 days without any problems.  I have told her to hold it until she finishes paclitaxel.  2.  Osteoporosis: - She gets DEXA scans done at [facility]. -She is on Prolia every 6 months at Dr. Carmie End office.  She is tolerating it well. -She will continue calcium and vitamin D twice daily.   3.  Recurrent DVT: - Right leg femoral and popliteal DVT, diagnosed on 03/08/2018.  She is currently on Eliquis. - She reported having leg swellings and decreased mobility prior to most recent DVT. - She had a history of DVT in 2000's, was treated on Coumadin until 5 years ago.  This was stopped secondary to a bleed complicated by her ulcerative colitis. - She was on a one-week bus trip to [place] when she first developed her blood clot.  4.  Ulcerative colitis: -She is currently on mesalamine and Humira.  Is well controlled.

## 2018-10-04 NOTE — Patient Instructions (Addendum)
[facility] Chemotherapy Teaching    You have been diagnosed with triple positive (ER+, PR+, HER2+) grade 1 invasive ductal carcinoma. You will be treated with paclitaxel (Taxol) weekly and trastuzumab (Herceptin) every 3 weeks. The intent of this treatment is to cure your cancer.  You will see the doctor regularly throughout treatment.  We monitor your lab work prior to every treatment. The doctor monitors your response to treatment by the way you are feeling, your blood work, and scans periodically.  There will be wait times while you are here for treatment.  It will take about 30 minutes to 1 hour for your lab work to result.  Then there will be wait times while pharmacy mixes your medications.   You will receive the the following premedications prior to each of the Taxol treatments:    Medications you will receive in the clinic prior to your chemotherapy medications:    Pepcid:  This medication is a histamine blocker that helps prevent and allergic reaction to your chemotherapy.   Dexamethasone:  This is a steroid given prior to chemotherapy to help prevent allergic reactions; it may also help prevent and control nausea and diarrhea.   Benadryl:  This is a histamine blocker (different from the Pepcid) that helps prevent allergic/infusion reactions to your chemotherapy. This medication may cause dizziness/drowsiness.   Tylenol:  Helps prevent an infusion reaction (fever, chills) that may be caused by trastuzumab (Herceptin).   Paclitaxel (Taxol)  About This Drug Paclitaxel is a drug used to treat cancer. It is given in the vein (IV).  This will take 1 hour to infuse.  This first infusion will take longer to infuse because it is increased slowly to monitor for reactions.  The nurse will be in the room with you for the first 15 minutes of the first infusion.  Possible Side Effects  . Hair loss. Hair loss is often temporary, although with certain medicine, hair loss can  sometimes be permanent. Hair loss may happen suddenly or gradually. If you lose hair, you may lose it from your head, face, armpits, pubic area, chest, and/or legs. You may also notice your hair getting thin.  . Swelling of your legs, ankles and/or feet (edema)  . Flushing  . Nausea and throwing up (vomiting)  . Loose bowel movements (diarrhea)  . Bone marrow depression. This is a decrease in the number of white blood cells, red blood cells, and platelets. This may raise your risk of infection, make you tired and weak (fatigue), and raise your risk of bleeding.  . Effects on the nerves are called peripheral neuropathy. You may feel numbness, tingling, or pain in your hands and feet. It may be hard for you to button your clothes, open jars, or walk as usual. The effect on the nerves may get worse with more doses of the drug. These effects get better in some people after the drug is stopped but it does not get better in all people.  . Changes in your liver function  . Bone, joint and muscle pain  . Abnormal EKG  . Allergic reaction: Allergic reactions, including anaphylaxis are rare but may happen in some patients. Signs of allergic reaction to this drug may be swelling of the face, feeling like your tongue or throat are swelling, trouble breathing, rash, itching, fever, chills, feeling dizzy, and/or feeling that your heart is beating in a fast or not normal way. If this happens, do not take another dose of this drug.  You should get urgent medical treatment.  . Infection  . Changes in your kidney function.  Note: Each of the side effects above was reported in 20% or greater of patients treated with paclitaxel. Not all possible side effects are included above.  Warnings and Precautions  . Severe allergic reactions  . Severe bone marrow depression  Treating Side Effects  . To help with hair loss, wash with a mild shampoo and avoid washing your hair every day.  . Avoid rubbing your  scalp, instead, pat your hair or scalp dry  . Avoid coloring your hair  . Limit your use of hair spray, electric curlers, blow dryers, and curling irons.  . If you are interested in getting a wig, talk to your nurse. You can also call the [place] at [phone]-[phone] to find out information about the "Look Good, Feel Better" program close to where you live. It is a free program where women getting chemotherapy can learn about wigs, turbans and scarves as well as makeup techniques and skin and nail care.  . Ask your doctor or nurse about medicines that are available to help stop or lessen diarrhea and/or nausea.  . To help with nausea and vomiting, eat small, frequent meals instead of three large meals a day. Choose foods and drinks that are at room temperature. Ask your nurse or doctor about other helpful tips and medicine that is available to help or stop lessen these symptoms.  . If you get diarrhea, eat low-fiber foods that are high in protein and calories and avoid foods that can irritate your digestive tracts or lead to cramping. Ask your nurse or doctor about medicine that can lessen or stop your diarrhea.  . Mouth care is very important. Your mouth care should consist of routine, gentle cleaning of your teeth or dentures and rinsing your mouth with a mixture of 1/2 teaspoon of salt in 8 ounces of water or  teaspoon of baking soda in 8 ounces of water. This should be done at least after each meal and at bedtime.  . If you have mouth sores, avoid mouthwash that has alcohol. Also avoid alcohol and smoking because they can bother your mouth and throat.  . Drink plenty of fluids (a minimum of eight glasses per day is recommended).  . Take your temperature as your doctor or nurse tells you, and whenever you feel like you may have a fever.  . Talk to your doctor or nurse about precautions you can take to avoid infections and bleeding.  . Be careful when cooking, walking, and  handling sharp objects and hot liquids.  Food and Drug Interactions  . There are no known interactions of paclitaxel with food.  . This drug may interact with other medicines. Tell your doctor and pharmacist about all the medicines and dietary supplements (vitamins, minerals, herbs and others) that you are taking at this time.  . The safety and use of dietary supplements and alternative diets are often not known. Using these might affect your cancer or interfere with your treatment. Until more is known, you should not use dietary supplements or alternative diets without your cancer doctor's help.  When to Call the Doctor  Call your doctor or nurse if you have any of the following symptoms and/or any new or unusual symptoms:  . Fever of 100.5 F (38 C) or above  . Chills  . Redness, pain, warmth, or swelling at the IV site during the infusion  . Signs  of allergic reaction: swelling of the face, feeling like your tongue or throat are swelling, trouble breathing, rash, itching, fever, chills, feeling dizzy, and/or feeling that your heart is beating in a fast or not normal way  . Feeling that your heart is beating in a fast or not normal way (palpitations)  . Weight gain of 5 pounds in one week (fluid retention)  . Decreased urine or very dark urine  . Signs of liver problems: dark urine, pale bowel movements, bad stomach pain, feeling very tired and weak, unusual  itching, or yellowing of the eyes or skin  . Heavy menstrual period that lasts longer than normal  . Easy bruising or bleeding  . Nausea that stops you from eating or drinking, and/or that is not relieved by prescribed medicines.  . Loose bowel movements (diarrhea) more than 4 times a day or diarrhea with weakness or lightheadedness  . Pain in your mouth or throat that makes it hard to eat or drink  . Lasting loss of appetite or rapid weight loss of five pounds in a week  . Signs of peripheral neuropathy: numbness,  tingling, or decreased feeling in fingers or toes; trouble walking or changes in the way you walk; or feeling clumsy when buttoning clothes, opening jars, or other routine activities  . Joint and muscle pain that is not relieved by prescribed medicines  . Extreme fatigue that interferes with normal activities  . While you are getting this drug, please tell your nurse right away if you have any pain, redness, or swelling at the site of the IV infusion.  . If you think you are pregnant.  Reproduction Warnings  . Pregnancy warning: This drug may have harmful effects on the unborn child, it is recommended that effective methods of birth control should be used during your cancer treatment. Let your doctor know right away if you think you may be pregnant.  . Breast feeding warning: Women should not breast feed during treatment because this drug could enter the breastmilk and cause harm to a breast feeding baby.   Trastuzumab (Herceptin)  About This Drug  Trastuzumab is used to treat cancer. It is given in the vein (IV)  Possible Side Effects  . Bone marrow suppression. This is a decrease in the number of white blood cells, red blood cells, and platelets. This may raise your risk of infection, make you tired and weak (fatigue), and raise your risk of bleeding.  . Congestive heart failure - your heart has less ability to pump blood properly  . Soreness of the mouth and throat. You may have red areas, white patches, or sores that hurt  . Nausea  . Diarrhea (loose bowel movements)  . Fever  . Chills  . Tiredness  . Infection  . Inflammation of nasal passages and throat  . Changes in the way food and drinks taste  . Weight loss  . Headache  . Trouble sleeping  . Cough  . Upper respiratory infection  . Rash  Note: Each of the side effects above was reported in 10% or greater of patients treated with trastuzumab. Not all possible side effects are included  above.   Warnings and Precautions  . Changes in the tissue of the heart and heart function. Some changes may happen that can cause your heart to have less ability to pump blood. This drug may also increase your risk of heart attack.  . Serious and life-threatening lung problems such as inflammation (swelling) and scarring  of the lungs which makes breathing difficult.  . While you are getting this drug in your vein (IV), you may have a reaction to the drug. Sometimes you may be given medication to stop or lessen these side effects. Your nurse will check you closely for these signs: fever or shaking chills, flushing, facial swelling, feeling dizzy, headache, trouble breathing, rash, itching, chest tightness, or chest pain. These reactions may happen after your infusion. If this happens, call 911 for emergency care.  . Severe decrease in the number of white blood cells. This may raise your risk of infection which may be life-threatening.  Note: Some of the side effects above are very rare. If you have concerns and/or questions, please discuss them with your medical team.  Important Information  . This drug may be present in the saliva, tears, sweat, urine, stool, vomit, semen, and vaginal secretions. Talk to your doctor and/or your nurse about the necessary precautions to take during this time.  Treating Side Effects  . Manage tiredness by pacing your activities for the day.  . Be sure to include periods of rest between energy-draining activities.  . To decrease the risk of infection, wash your hands regularly.  . Avoid close contact with people who have a cold, the flu, or other infections.  . Take your temperature as your doctor or nurse tells you, and whenever you feel like you may have a fever.  . To help decrease the risk of bleeding, use a soft toothbrush. Check with your nurse before using dental floss.  . Be very careful when using knives or tools.  . Use an  electric shaver instead of a razor.  . Drink plenty of fluids (a minimum of eight glasses per day is recommended).  . To help with nausea, eat small, frequent meals instead of three large meals a day. Choose foods and drinks that are at room temperature. Ask your nurse or doctor about other helpful tips and medicine that is available to help stop or lessen these symptoms.  . Mouth care is very important. Your mouth care should consist of routine, gentle cleaning of your teeth or dentures and rinsing your mouth with a mixture of 1/2 teaspoon of salt in 8 ounces of water or 1/2 teaspoon of baking soda in 8 ounces of water. This should be done at least after each meal and at bedtime.  . If you have mouth sores, avoid mouthwash that has alcohol. Also avoid alcohol and smoking because they can bother your mouth and throat.  . If you throw up or have loose bowel movements, you should drink more fluids so that you do not become dehydrated (lack of water in the body from losing too much fluid).  . If you have diarrhea, eat low-fiber foods that are high in protein and calories and avoid foods that can irritate your digestive tracts or lead to cramping.  . Ask your nurse or doctor about medicine that can lessen or stop your diarrhea.  . To help with weight loss, drink fluids that contribute calories (whole milk, juice, soft drinks, sweetened beverages, milkshakes, and nutritional supplements) instead of water.  . Include a source of protein at every meal and snack, such as meat, poultry, fish, dry beans, tofu, eggs, nuts, milk, yogurt, cheese, ice cream, pudding, and nutritional supplements.  . If you get a rash do not put anything on it unless your doctor or nurse says you may. Keep the area around the rash clean and dry.  Ask your doctor for medicine if your rash bothers you.  Marland Kitchen Keeping your pain under control is important to your well-being. Please tell your doctor or nurse if you are  experiencing pain.  . If you are having trouble sleeping, talk to your nurse or doctor on tips to help you sleep better  . Infusion reactions may occur after your infusion. If this happens, call 911 for emergency care.  Food and Drug Interactions  . There are no known interactions of trastuzumab-xxxx with food.  . This drug may interact with other medicines. Tell your doctor and pharmacist about all the prescription and over-the-counter medicines and dietary supplements (vitamins, minerals, herbs and others) that you are taking at this time. Also, check with your doctor or pharmacist before starting any new prescription or over-the-counter medicines, or dietary supplements to make sure that there are no interactions.  When to Call the Doctor  Call your doctor or nurse if you have any of these symptoms and/or any new or unusual symptoms:  . Fever of 100.4 F (38 C) or higher  . Chills  . Tiredness that interferes with your daily activities  . Trouble falling or staying asleep  . Feeling dizzy or lightheaded  . A headache that does not go away  . Easy bleeding or bruising  . Wheezing or trouble breathing or dry cough  . Coughing up yellow, green, or bloody mucus  . Feeling that your heart is beating in a fast or not normal way (palpitations)  . Chest pain or symptoms of a heart attack. Most heart attacks involve pain in the center of the chest that lasts more than a few minutes. The pain may go away and come back, or it can be constant. It can feel like pressure, squeezing, fullness, or pain. Sometimes pain is felt in one or both arms, the back, neck, jaw, or stomach. If any of these symptoms last 2 minutes, call 911.  . Pain in your mouth or throat that makes it hard to eat or drink  . Nausea that stops you from eating or drinking and/or is not relieved by prescribed medicines  . Diarrhea, 4 times in one day or diarrhea with lack of strength or a feeling of being  dizzy  . Lasting loss of appetite or rapid weight loss of five pounds in a week  . Swelling of arms, hand, legs, and/or feet   SELF CARE ACTIVITIES WHILE ON CHEMOTHERAPY:  Hydration  Increase your fluid intake 48 hours prior to treatment and drink at least 8 to 12 cups (64 ounces) of water/decaffeinated beverages per day after treatment. You can still have your cup of coffee or soda but these beverages do not count as part of your 8 to 12 cups that you need to drink daily. No alcohol intake.  Medications  Continue taking your normal prescription medication as prescribed.  If you start any new herbal or new supplements please let us know first to make sure it is safe.  Mouth Care  Have teeth cleaned professionally before starting treatment. Keep dentures and partial plates clean. Use soft toothbrush and do not use mouthwashes that contain alcohol. Biotene is a good mouthwash that is available at most pharmacies or may be ordered by calling [phone]. Use warm salt water gargles (1 teaspoon salt per 1 quart warm water) before and after meals and at bedtime. Or you may rinse with 2 tablespoons of three-percent hydrogen peroxide mixed in eight ounces of water. If  you are still having problems with your mouth or sores in your mouth please call the clinic. If you need dental work, please let the doctor know before you go for your appointment so that we can coordinate the best possible time for you in regards to your chemo regimen. You need to also let your dentist know that you are actively taking chemo. We may need to do labs prior to your dental appointment.  Skin Care  Always use sunscreen that has not expired and with SPF (Sun Protection Factor) of 50 or higher. Wear hats to protect your head from the sun. Remember to use sunscreen on your hands, ears, face, & feet.  Use good moisturizing lotions such as udder cream, eucerin, or even Vaseline. Some chemotherapies can cause dry skin, color  changes in your skin and nails.    . Avoid long, hot showers or baths. . Use gentle, fragrance-free soaps and laundry detergent. . Use moisturizers, preferably creams or ointments rather than lotions because the thicker consistency is better at preventing skin dehydration. Apply the cream or ointment within 15 minutes of showering. Reapply moisturizer at night, and moisturize your hands every time after you wash them.  Hair Loss (if your doctor says your hair will fall out)  . If your doctor says that your hair is likely to fall out, decide before you begin chemo whether you want to wear a wig. You may want to shop before treatment to match your hair color. . Hats, turbans, and scarves can also camouflage hair loss, although some people prefer to leave their heads uncovered. If you go bare-headed outdoors, be sure to use sunscreen on your scalp. . Cut your hair short. It eases the inconvenience of shedding lots of hair, but it also can reduce the emotional impact of watching your hair fall out. . Don't perm or color your hair during chemotherapy. Those chemical treatments are already damaging to hair and can enhance hair loss. Once your chemo treatments are done and your hair has grown back, it's OK to resume dyeing or perming hair.  With chemotherapy, hair loss is almost always temporary. But when it grows back, it may be a different color or texture. In older adults who still had hair color before chemotherapy, the new growth may be completely gray.  Often, new hair is very fine and soft.  Infection Prevention  Please wash your hands for at least 30 seconds using warm soapy water. Handwashing is the #1 way to prevent the spread of germs. Stay away from sick people or people who are getting over a cold. If you develop respiratory systems such as green/yellow mucus production or productive cough or persistent cough let us know and we will see if you need an antibiotic. It is a good idea to keep a  pair of gloves on when going into grocery stores/[employer] to decrease your risk of coming into contact with germs on the carts, etc. Carry alcohol hand gel with you at all times and use it frequently if out in public. If your temperature reaches 100.5 or higher please call the clinic and let us know.  If it is after hours or on the weekend please go to the ER if your temperature is over 100.5.  Please have your own personal thermometer at home to use.    Sex and bodily fluids  If you are going to have sex, a condom must be used to protect the person that isn't taking chemotherapy. Chemo can  decrease your libido (sex drive). For a few days after chemotherapy, chemotherapy can be excreted through your bodily fluids.  When using the toilet please close the lid and flush the toilet twice.  Do this for a few day after you have had chemotherapy.   Effects of chemotherapy on your sex life  Some changes are simple and won't last long. They won't affect your sex life permanently.  Sometimes you may feel: . too tired . not strong enough to be very active . sick or sore  . not in the mood . anxious or low Your anxiety might not seem related to sex. For example, you may be worried about the cancer and how your treatment is going. Or you may be worried about money, or about how you family are coping with your illness. These things can cause stress, which can affect your interest in sex. It's important to talk to your partner about how you feel. Remember - the changes to your sex life don't usually last long. There's usually no medical reason to stop having sex during chemo. The drugs won't have any long term physical effects on your performance or enjoyment of sex. Cancer can't be passed on to your partner during sex  Contraception  It's important to use reliable contraception during treatment. Avoid getting pregnant while you or your partner are having chemotherapy. This is because the drugs may harm the  baby. Sometimes chemotherapy drugs can leave a man or woman infertile.  This means you would not be able to have children in the future. You might want to talk to someone about permanent infertility. It can be very difficult to learn that you may no longer be able to have children. Some people find counselling helpful. There might be ways to preserve your fertility, although this is easier for men than for women. You may want to speak to a fertility expert. You can talk about sperm banking or harvesting your eggs. You can also ask about other fertility options, such as donor eggs. If you have or have had breast cancer, your doctor might advise you not to take the contraceptive pill. This is because the hormones in it might affect the cancer.  It is not known for sure whether or not chemotherapy drugs can be passed on through semen or secretions from the vagina. Because of this some doctors advise people to use a barrier method if you have sex during treatment. This applies to vaginal, anal or oral sex. Generally, doctors advise a barrier method only for the time you are actually having the treatment and for about a week after your treatment. Advice like this can be worrying, but this does not mean that you have to avoid being intimate with your partner. You can still have close contact with your partner and continue to enjoy sex.  Animals  If you have cats or birds we just ask that you not change the litter or change the cage.  Please have someone else do this for you while you are on chemotherapy.   Food Safety During and After Cancer Treatment  Food safety is important for people both during and after cancer treatment. Cancer and cancer treatments, such as chemotherapy, radiation therapy, and stem cell/bone marrow transplantation, often weaken the immune system. This makes it harder for your body to protect itself from foodborne illness, also called food poisoning. Foodborne illness is caused by  eating food that contains harmful bacteria, parasites, or viruses.  Foods to avoid  Some foods have a higher risk of becoming tainted with bacteria. These include: Marland Kitchen Unwashed fresh fruit and vegetables, especially leafy vegetables that can hide dirt and other contaminants . Raw sprouts, such as alfalfa sprouts . Raw or undercooked beef, especially ground beef, or other raw or undercooked meat and poultry . Fatty, fried, or spicy foods immediately before or after treatment.  These can sit heavy on your stomach and make you feel nauseous. . Raw or undercooked shellfish, such as oysters. . Sushi and sashimi, which often contain raw fish.  . Unpasteurized beverages, such as unpasteurized fruit juices, raw milk, raw yogurt, or cider . Undercooked eggs, such as soft boiled, over easy, and poached; raw, unpasteurized eggs; or foods made with raw egg, such as homemade raw cookie dough and homemade mayonnaise  Simple steps for food safety  Shop smart. . Do not buy food stored or displayed in an unclean area. . Do not buy bruised or damaged fruits or vegetables. . Do not buy cans that have cracks, dents, or bulges. . Pick up foods that can spoil at the end of your shopping trip and store them in a cooler on the way home.  Prepare and clean up foods carefully. . Rinse all fresh fruits and vegetables under running water, and dry them with a clean towel or paper towel. . Clean the top of cans before opening them. . After preparing food, wash your hands for 20 seconds with hot water and soap. Pay special attention to areas between fingers and under nails. . Clean your utensils and dishes with hot water and soap. Marland Kitchen Disinfect your kitchen and cutting boards using 1 teaspoon of liquid, unscented bleach mixed into 1 quart of water.    Dispose of old food. . Eat canned and packaged food before its expiration date (the "use by" or "best before" date). . Consume refrigerated leftovers within 3 to 4 days.  After that time, throw out the food. Even if the food does not smell or look spoiled, it still may be unsafe. Some bacteria, such as Listeria, can grow even on foods stored in the refrigerator if they are kept for too long.  Take precautions when eating out. . At restaurants, avoid buffets and salad bars where food sits out for a long time and comes in contact with many people. Food can become contaminated when someone with a virus, often a norovirus, or another "bug" handles it. . Put any leftover food in a "to-go" container yourself, rather than having the server do it. And, refrigerate leftovers as soon as you get home. . Choose restaurants that are clean and that are willing to prepare your food as you order it cooked.   MEDICATIONS:  Compazine/Prochlorperazine 41m tablet. Take 1 tablet every 6 hours as needed for nausea/vomiting. (This can make you sleepy)   EMLA cream. Apply a quarter size amount to port site 1 hour prior to chemo. Do not rub in. Cover with plastic wrap.   Over-the-Counter Meds:  Colace - 100 mg capsules - take 2 capsules daily.  If this doesn't help then you can increase to 2 capsules twice daily.  Call u[place]if this does not help your bowels move.   Imodium 245mcapsule. Take 2 capsules after the 1st loose stool and then 1 capsule every 2 hours until you go a total of 12 hours without having a loose stool. Call the [facility] loose stools continue. If diarrhea occurs at bedtime, take 2 capsules at bedtime. Then take 2 capsules every 4 hours until morning. Call [facility] [place]   Diarrhea Sheet   If you are having loose stools/diarrhea, please purchase Imodium and begin taking as outlined:  At the first sign of poorly formed or loose stools you should begin taking Imodium (loperamide) 2 mg capsules.  Take  two caplets (23m33mfollowed by one caplet (2mg11mvery 2 hours until you have had no diarrhea for 12 hours.  During the night take two caplets (23mg)32m bedtime and continue every 4 hours during the night until the morning.  Stop taking Imodium only after there is no sign of diarrhea for 12 hours.    Always call the [place] are having loose stools/diarrhea that you can't get under control.  Loose stools/diarrhea leads to dehydration (loss of water) in your body.  We have other options of trying to get the loose stools/diarrhea to stop but you must let us knKorea!   Constipation Sheet  Colace - 100 mg capsules - take 2 capsules daily.  If this doesn't help then you can increase to 2 capsules twice daily.  Please call if the above does not work for you.   Do not go more than 2 days without a bowel movement.  It is very important that you do not become constipated.  It will make you feel sick to your stomach (nausea) and can cause abdominal pain and vomiting.   Nausea Sheet   Compazine/Prochlorperazine 10mg 42met. Take 1 tablet every 6 hours as needed for nausea/vomiting. (This can make you sleepy)  If you are having persistent nausea (nausea that does not stop) please call the [facility] us kno[place]athe amount of nausea that you are experiencing.  If you begin to vomit, you need to call the [facility] [place] it is the weekend and you have vomited more than one time and can't get it to stop-go to the Emergency Room.  Persistent nausea/vomiting can lead to dehydration (loss of fluid in your body) and will make you feel terrible.   Ice chips, sips of clear liquids, foods that are @ room temperature, crackers, and toast tend to be better tolerated.   SYMPTOMS TO REPORT AS SOON AS POSSIBLE AFTER TREATMENT:   FEVER GREATER THAN 100.5 F  CHILLS WITH OR WITHOUT FEVER  NAUSEA AND VOMITING THAT IS NOT CONTROLLED WITH YOUR NAUSEA MEDICATION  UNUSUAL SHORTNESS OF BREATH  UNUSUAL  BRUISING OR BLEEDING  TENDERNESS IN MOUTH AND THROAT WITH OR WITHOUT PRESENCE OF ULCERS  URINARY PROBLEMS  BOWEL PROBLEMS  UNUSUAL RASH      Wear comfortable clothing and clothing appropriate for easy access to any Portacath or PICC line. Let  us know if there is anything that we can do to make your therapy better!    What to do if you need assistance after hours or on the weekends: CALL [phone].  HOLD on the line, do not hang up.  You will hear multiple messages but at the end you will be connected with a nurse triage line.  They will contact the doctor if necessary.  Most of the time they will be able to assist you.  Do not call the hospital operator.      I have been informed and understand all of the instructions given to me and have received a copy. I have been instructed to call the clinic [phone] or my family physician as soon as possible for continued medical care, if indicated. I do not have any more questions at this time but understand that I may call the [place] or the Patient Navigator at [phone] during office hours should I have questions or need assistance in obtaining follow-up care.

## 2018-10-04 NOTE — Progress Notes (Signed)
[place], [place] [address]   CLINIC:  Medical Oncology/Hematology  PCP:  Octavio Graves, DO [place] [address] [place] [address] [phone]   REASON FOR VISIT: Follow-up for Left breast cancer, HER-2 positive  CURRENT THERAPY:mastectomy scheduled for 08/19/18,will start Arimidex back 07/22/18   INTERVAL HISTORY:  Monique Day 83 y.o. female returns for routine follow-up for left breast cancer. She back today and doing well. She will be having her Echo on Thursday. Denies any nausea, vomiting, or diarrhea. Denies any new pains. Had not noticed any recent bleeding such as epistaxis, hematuria or hematochezia. Denies recent chest pain on exertion, shortness of breath on minimal exertion, pre-syncopal episodes, or palpitations. Denies any numbness or tingling in hands or feet. Denies any recent fevers, infections, or recent hospitalizations. Patient reports appetite at 100% and energy level at 100%.    REVIEW OF SYSTEMS:  Review of Systems  All other systems reviewed and are negative.    PAST MEDICAL/SURGICAL HISTORY:  Past Medical History:  Diagnosis Date  . Adult idiopathic generalized osteoporosis   . Arthritis    "minor" (08/18/2018)  . Breast cancer, left breast ([place]) 1998   lumpectomy  . Breast cancer, right breast ([place]) 2009   lumpectomy; mastectomy  . DVT (deep venous thrombosis) ([place]) "early 2000's"   RLE  . DVT (deep venous thrombosis) ([place])   . Hemorrhoids, external   . History of blood transfusion    "related to ulcerative colitis"  . Lower abdominal pain   . Occult blood in stools   . Peripheral edema   . Seizures ([place])    one very slight seizure with the stroke; somewhere between 2005-2009  . Stroke [facility]) 2005-2009   "very light;" ; denies residual on 08/19/2018  . UC (ulcerative colitis) ([place]) dx'd 5364   Past Surgical History:  Procedure Laterality Date  . APPENDECTOMY    . bone density  12/11/10  . BREAST  BIOPSY Left 1998; 2019 X 2  . BREAST BIOPSY Right 2009  . BREAST LUMPECTOMY Left 1998  . CATARACT EXTRACTION W/ INTRAOCULAR LENS  IMPLANT, BILATERAL Bilateral   . COLONOSCOPY  02/21/2011  . COLONOSCOPY  10/06/2008  . COLONOSCOPY  12/27/01  . COLONOSCOPY  05/09/05  . COLONOSCOPY N/A 07/02/2016   Procedure: COLONOSCOPY;  Surgeon: [name], MD;  Location[facility];  Service: Endoscopy;  Laterality: N/A;  1055  . [place]   "I was having rectal bleeding"  . FRACTURE SURGERY    . MASTECTOMY Right 2009  . MASTECTOMY COMPLETE / SIMPLE Left 08/19/2018  . SIMPLE MASTECTOMY WITH AXILLARY SENTINEL NODE BIOPSY Left 08/19/2018   Procedure: LEFT SIMPLE MASTECTOMY;  Surgeon: [name], MD;  Location: [facility];  Service: General;  Laterality: Left;  . TONSILLECTOMY    . VENA CAVA FILTER PLACEMENT Right   . WRIST FRACTURE SURGERY Right      SOCIAL HISTORY:  Social History   Socioeconomic History  . Marital status: Widowed    Spouse name: Not on file  . Number of children: 0  . Years of education: Not on file  . Highest education level: Not on file  Occupational History  . Occupation: Optometrist: [employer]  Social Needs  . Financial resource strain: Not hard at all  . Food insecurity:    Worry: Never true    Inability: Never true  . Transportation needs:  Medical: No    Non-medical: No  Tobacco Use  . Smoking status: Former Smoker    Packs/day: 2.00    Years: 40.00    Pack years: 80.00    Types: Cigarettes    Last attempt to quit: 05/05/2001    Years since quitting: 17.4  . Smokeless tobacco: Never Used  Substance and Sexual Activity  . Alcohol use: Yes    Comment: 08/18/2018 "couple drinks/year"  . Drug use: Never  . Sexual activity: Not Currently  Lifestyle  . Physical activity:    Days per week: 0 days    Minutes per session: 0 min  . Stress: Not at all  Relationships  . Social connections:    Talks  on phone: More than three times a week    Gets together: More than three times a week    Attends religious service: More than 4 times per year    Active member of club or organization: Yes    Attends meetings of clubs or organizations: More than 4 times per year    Relationship status: Widowed  . Intimate partner violence:    Fear of current or ex partner: No    Emotionally abused: No    Physically abused: No    Forced sexual activity: No  Other Topics Concern  . Not on file  Social History Narrative  . Not on file    FAMILY HISTORY:  Family History  Problem Relation Age of Onset  . Prostate cancer Father   . Colon cancer Neg Hx     CURRENT MEDICATIONS:  Outpatient Encounter Medications as of 10/04/2018  Medication Sig Note  . acetaminophen (TYLENOL) 500 MG tablet Take 500 mg by mouth daily as needed for moderate pain or headache.   Marland Kitchen apixaban (ELIQUIS) 5 MG TABS tablet Take 2 tablets (10 mg total) by mouth 2 (two) times daily. Then 1 tablet (5 mg) two times daily starting 03/16/18 (Patient taking differently: Take 2.5 mg by mouth 2 (two) times daily. ) 08/20/2018: Restart on Sunday   . Ascorbic Acid (VITAMIN C) 1000 MG tablet Take 1,000 mg by mouth daily.   . Calcium Carb-Cholecalciferol (CALCIUM 500 + D3) 500-600 MG-UNIT TABS Take 2 tablets by mouth daily. 09/21/2018: Currently holding  . denosumab (PROLIA) 60 MG/ML SOLN injection Inject 60 mg into the skin every 6 (six) months. Administer in upper arm, thigh, or abdomen 08/03/2018: Last dose 06/2018  . exemestane (AROMASIN) 25 MG tablet Take 1 tablet (25 mg total) by mouth daily after breakfast.   . HUMIRA PEN 40 MG/0.8ML PNKT INJECT 40 MG SUBQ EVERY 14 DAYS. (Patient taking differently: Inject 40 mg as directed every 14 (fourteen) days. )   . Magnesium 500 MG TABS Take 500 mg by mouth daily.   . mesalamine (APRISO) 0.375 g 24 hr capsule Take 4 capsules by mouth each morning.   . Naphazoline-Pheniramine (OPCON-A) 0.027-0.315 % SOLN  Place 1 drop into both eyes daily as needed (dry eyes).  09/21/2018: As needed , rare per patient  . polyethylene glycol powder (GLYCOLAX/MIRALAX) powder Take 8.5 g by mouth daily as needed.   Marland Kitchen PRESCRIPTION MEDICATION Apply 1 application topically as needed (eczema). Ketoconazole-fluticasone 1:1 compounded cream 08/03/2018: Compounded at [facility]  . [DISCONTINUED] anastrozole (ARIMIDEX) 1 MG tablet Take 1 tablet (1 mg total) by mouth daily. (Patient not taking: Reported on 10/04/2018) 09/21/2018: Currently holding this medication.   No facility-administered encounter medications on file as of 10/04/2018.     ALLERGIES:  Allergies  Allergen Reactions  . Mercaptopurine Other (See Comments)    Dropped WBC  . Sulfa Antibiotics Other (See Comments)    Extreme Weakness     PHYSICAL EXAM:  ECOG Performance status: 1  Vitals:   10/04/18 1000  BP: (!) 143/61  Pulse: 74  Resp: 16  Temp: 98.4 F (36.9 C)  SpO2: 99%   Filed Weights   10/04/18 1000  Weight: 173 lb 2 oz (78.5 kg)    Physical Exam Constitutional:      Appearance: Normal appearance. She is normal weight.  Musculoskeletal: Normal range of motion.  Skin:    General: Skin is warm and dry.  Neurological:     Mental Status: She is alert and oriented to person, place, and time. Mental status is at baseline.  Psychiatric:        Mood and Affect: Mood normal.        Behavior: Behavior normal.        Thought Content: Thought content normal.        Judgment: Judgment normal.      LABORATORY DATA:  I have reviewed the labs as listed.  CBC    Component Value Date/Time   WBC 7.2 09/21/2018 1139   RBC 4.14 09/21/2018 1139   HGB 12.6 09/21/2018 1139   HGB 14.4 03/10/2017 1254   HCT 40.4 09/21/2018 1139   HCT 41.5 03/10/2017 1254   PLT 298 09/21/2018 1139   PLT 249 03/10/2017 1254   MCV 97.6 09/21/2018 1139   MCV 91 03/10/2017 1254   MCH 30.4 09/21/2018 1139   MCHC 31.2 09/21/2018 1139   RDW 12.4 09/21/2018 1139   RDW  13.3 03/10/2017 1254   LYMPHSABS 2.4 09/21/2018 1139   LYMPHSABS 2.7 03/10/2017 1254   MONOABS 0.6 09/21/2018 1139   EOSABS 0.1 09/21/2018 1139   EOSABS 0.4 03/10/2017 1254   BASOSABS 0.0 09/21/2018 1139   BASOSABS 0.1 03/10/2017 1254   CMP Latest Ref Rng & Units 09/21/2018 08/20/2018 08/16/2018  Glucose 70 - 99 mg/dL 104(H) 167(H) 96  BUN 8 - 23 mg/dL 23 18 19   Creatinine 0.44 - 1.00 mg/dL 1.06(H) 1.05(H) 1.02(H)  Sodium 135 - 145 mmol/L 138 137 139  Potassium 3.5 - 5.1 mmol/L 4.7 4.4 3.9  Chloride 98 - 111 mmol/L 101 106 102  CO2 22 - 32 mmol/L 26 22 26   Calcium 8.9 - 10.3 mg/dL 9.1 8.2(L) 9.5  Total Protein 6.5 - 8.1 g/dL 7.6 - 8.0  Total Bilirubin 0.3 - 1.2 mg/dL 0.4 - 0.6  Alkaline Phos 38 - 126 U/L 28(L) - 27(L)  AST 15 - 41 U/L 18 - 20  ALT 0 - 44 U/L 17 - 15       DIAGNOSTIC IMAGING:  I have independently reviewed the scans and discussed with the patient.   I have reviewed Francene Finders, NP's note and agree with the documentation.  I personally performed a face-to-face visit, made revisions and my assessment and plan is as follows.    ASSESSMENT & PLAN:   Breast cancer of upper-inner quadrant of left female breast ([place]) 1.  Stage I (PT1CNX) HER-2 positive left breast cancer: - Abnormal mammogram at [facility], followed by ultrasound-guided biopsy on 06/23/2018.  IDC, HER-2 positive, ER/PR positive, Ki 67 2%. - Stereotactic biopsy in [place] on 06/29/2018, of the left breast irregular mass within the posterior upper, slightly inner quadrant.  A coil-shaped clip was placed. - This was consistent with invasive ductal carcinoma, grade 1, ER  positive, PR/HER-2 negative.  Ki 67 was 10%. - Patient had a history of left breast cancer in 1998, status post lumpectomy, followed by chemotherapy and radiation, 1 year of tamoxifen which was switched to anastrozole for subsequent 4 years due to intolerance. - She had a history of right breast cancer in 2009, status post mastectomy.  She  did not receive any adjuvant chemotherapy or radiation. - She was started on anastrozole on 07/21/2018, she took it for 2 weeks and felt very emotional.  She stopped taking it. -Left mastectomy on 08/19/2018, 1.1 cm IDC, grade 1, margins negative.  No lymph nodes were identified.,  ER was 100% positive, PR 7% positive and HER-2 3+ positive.  - I have called and talked to Dr. Claudette Laws (pathologist).  He clarified that there was an initial biopsy done on 06/23/2018, at [facility].  This report is not uploaded on epic.  The initial biopsy was reportedly positive for HER-2. -The second biopsy on 06/29/2018 was negative for HER-2.  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 - The second focus was not clearly identified on the mastectomy specimen. - I have recommended adjuvant chemotherapy with weekly paclitaxel for 12 cycles and Herceptin for 1 year.  We talked about the side effects of this regimen in detail.  She has a 2D echocardiogram scheduled next Thursday. - She is agreeable to try adjuvant chemotherapy.  We can always start paclitaxel if she develops significant adverse effects. - She has an appointment to see Dr. Arnoldo Morale next week for port placement. -At last visit we have prescribed her Aromasin.  She took it for 2 days without any problems.  I have told her to hold it until she finishes paclitaxel.  2.  Osteoporosis: - She gets D[facility]A scans done at [facility]. -She is on Prolia every 6 months at Dr. Carmie End office.  She is tolerating it well. -She will continue calcium and vitamin D twice daily.   3.  Recurrent DVT: - Right leg femoral and popliteal DVT, diagnosed on 03/08/2018.  She is currently on Eliquis. - She reported having leg swellings and decreased mobility prior to most recent DVT. - She had a history of DVT in 2000's, was treated on Coumadin until 5 years ago.  This was stopped secondary to a bleed complicated by her ulcerative  colitis. - She was on a one-week bus trip to [place] when she first developed her blood clot.  4.  Ulcerative colitis: -She is currently on mesalamine and Humira.  Is well controlled.  Total time spent is 40 minutes more than 50% of the time spent face-to-face discussing pathology report, treatment recommendations, chemotherapy side effects and coordination of care.    Orders placed this encounter:  No orders of the defined types were placed in this encounter.     Derek Jack, MD [place] [phone]

## 2018-10-04 NOTE — Patient Instructions (Signed)
[facility] Discharge Instructions     Thank you for choosing [facility] at [facility] to provide your oncology and hematology care.  To afford each patient quality time with our provider, please arrive at least 15 minutes before your scheduled appointment time.   If you have a lab appointment with the Cancer Center please come in thru the  Main Entrance and check in at the main information desk  You need to re-schedule your appointment should you arrive 10 or more minutes late.  We strive to give you quality time with our providers, and arriving late affects you and other patients whose appointments are after yours.  Also, if you no show three or more times for appointments you may be dismissed from the clinic at the providers discretion.     Again, thank you for choosing [facility].  Our hope is that these requests will decrease the amount of time that you wait before being seen by our physicians.       _____________________________________________________________  Should you have questions after your visit to  Cancer Center, please contact our office at [phone] between the hours of 8:00 a.m. and 4:30 p.m.  Voicemails left after 4:00 p.m. will not be returned until the following business day.  For prescription refill requests, have your pharmacy contact our office and allow 72 hours.    Cancer Center Support Programs:   > Cancer Support Group  2nd Tuesday of the month 1pm-2pm, Journey Room    

## 2018-10-07 ENCOUNTER — Ambulatory Visit (HOSPITAL_COMMUNITY)
Admission: RE | Admit: 2018-10-07 | Discharge: 2018-10-07 | Disposition: A | Discharge status: Home / Self Care | Payer: Medicare Other | Source: Ambulatory Visit | Attending: Nurse Practitioner | Admitting: Nurse Practitioner

## 2018-10-07 ENCOUNTER — Encounter: Payer: Self-pay | Admitting: General Surgery

## 2018-10-07 ENCOUNTER — Ambulatory Visit: Payer: Medicare Other | Admitting: General Surgery

## 2018-10-07 ENCOUNTER — Inpatient Hospital Stay (HOSPITAL_COMMUNITY): Payer: Medicare Other

## 2018-10-07 VITALS — BP 181/91 | HR 74 | Temp 97.5°F | Resp 18 | Wt 173.4 lb

## 2018-10-07 DIAGNOSIS — Z170 Estrogen receptor positive status [ER+]: Secondary | ICD-10-CM | POA: Diagnosis present

## 2018-10-07 DIAGNOSIS — C50212 Malignant neoplasm of upper-inner quadrant of left female breast: Secondary | ICD-10-CM | POA: Insufficient documentation

## 2018-10-07 DIAGNOSIS — I4510 Unspecified right bundle-branch block: Secondary | ICD-10-CM | POA: Insufficient documentation

## 2018-10-07 LAB — ECHOCARDIOGRAM COMPLETE: Weight: 2774.4 oz

## 2018-10-07 NOTE — Progress Notes (Signed)
*  PRELIMINARY RESULTS* Echocardiogram 2D Echocardiogram has been performed.  Monique Day 10/07/2018, 12:15 PM

## 2018-10-07 NOTE — H&P (Signed)
Monique Day; [id number]; 1935-09-25   HPI Patient is an 83 year old white female who was referred to my care by Dr. Melina Copa and oncology for Port-A-Cath placement.  She was recently diagnosed with left breast cancer and is about to undergo chemotherapy.  She has had the Port-A-Cath in place in the remote past.  She currently has 0 out of 10 pain.  She is on Eliquis due to history of DVT. Past Medical History:  Diagnosis Date  . Adult idiopathic generalized osteoporosis   . Arthritis    "minor" (08/18/2018)  . Breast cancer, left breast ([place]) 1998   lumpectomy  . Breast cancer, right breast ([place]) 2009   lumpectomy; mastectomy  . DVT (deep venous thrombosis) ([place]) "early 2000's"   RLE  . DVT (deep venous thrombosis) ([place])   . Hemorrhoids, external   . History of blood transfusion    "related to ulcerative colitis"  . Lower abdominal pain   . Occult blood in stools   . Peripheral edema   . Seizures ([place])    one very slight seizure with the stroke; somewhere between 2005-2009  . Stroke [facility]) 2005-2009   "very light;" ; denies residual on 08/19/2018  . UC (ulcerative colitis) ([place]) dx'd 0321    Past Surgical History:  Procedure Laterality Date  . APPENDECTOMY    . bone density  12/11/10  . BREAST BIOPSY Left 1998; 2019 X 2  . BREAST BIOPSY Right 2009  . BREAST LUMPECTOMY Left 1998  . CATARACT EXTRACTION W/ INTRAOCULAR LENS  IMPLANT, BILATERAL Bilateral   . COLONOSCOPY  02/21/2011  . COLONOSCOPY  10/06/2008  . COLONOSCOPY  12/27/01  . COLONOSCOPY  05/09/05  . COLONOSCOPY N/A 07/02/2016   Procedure: COLONOSCOPY;  Surgeon: [name], MD;  [facility];  Service: Endoscopy;  Laterality: N/A;  1055  . [place]   "I was having rectal bleeding"  . FRACTURE SURGERY    . MASTECTOMY Right 2009  . MASTECTOMY COMPLETE / SIMPLE Left 08/19/2018  . SIMPLE MASTECTOMY WITH AXILLARY SENTINEL NODE BIOPSY Left 08/19/2018   Procedure: LEFT SIMPLE MASTECTOMY;   Surgeon: [name], MD;  Location: [place];  Service: General;  Laterality: Left;  . TONSILLECTOMY    . VENA CAVA FILTER PLACEMENT Right   . WRIST FRACTURE SURGERY Right     Family History  Problem Relation Age of Onset  . Prostate cancer Father   . Colon cancer Neg Hx     Current Outpatient Medications on File Prior to Visit  Medication Sig Dispense Refill  . acetaminophen (TYLENOL) 500 MG tablet Take 500 mg by mouth daily as needed for moderate pain or headache.    Marland Kitchen apixaban (ELIQUIS) 5 MG TABS tablet Take 2 tablets (10 mg total) by mouth 2 (two) times daily. Then 1 tablet (5 mg) two times daily starting 03/16/18 (Patient taking differently: Take 2.5 mg by mouth 2 (two) times daily. ) 73 tablet 0  . Ascorbic Acid (VITAMIN C) 1000 MG tablet Take 1,000 mg by mouth daily.    . Calcium Carb-Cholecalciferol (CALCIUM 500 + D3) 500-600 MG-UNIT TABS Take 2 tablets by mouth daily.    Marland Kitchen denosumab (PROLIA) 60 MG/ML SOLN injection Inject 60 mg into the skin every 6 (six) months. Administer in upper arm, thigh, or abdomen    . exemestane (AROMASIN) 25 MG tablet Take 1 tablet (25 mg total) by mouth daily after breakfast. 30 tablet 6  . HUMIRA PEN 40 MG/0.8ML PNKT INJECT  40 MG SUBQ EVERY 14 DAYS. (Patient taking differently: Inject 40 mg as directed every 14 (fourteen) days. ) 2 each 11  . Magnesium 500 MG TABS Take 500 mg by mouth daily.    . mesalamine (APRISO) 0.375 g 24 hr capsule Take 4 capsules by mouth each morning. 360 capsule 3  . Naphazoline-Pheniramine (OPCON-A) 0.027-0.315 % SOLN Place 1 drop into both eyes daily as needed (dry eyes).     . polyethylene glycol powder (GLYCOLAX/MIRALAX) powder Take 8.5 g by mouth daily as needed. 255 g 0  . PRESCRIPTION MEDICATION Apply 1 application topically as needed (eczema). Ketoconazole-fluticasone 1:1 compounded cream     No current facility-administered medications on file prior to visit.     Allergies  Allergen Reactions  . Mercaptopurine  Other (See Comments)    Dropped WBC  . Sulfa Antibiotics Other (See Comments)    Extreme Weakness    Social History   Substance and Sexual Activity  Alcohol Use Yes   Comment: 08/18/2018 "couple drinks/year"    Social History   Tobacco Use  Smoking Status Former Smoker  . Packs/day: 2.00  . Years: 40.00  . Pack years: 80.00  . Types: Cigarettes  . Last attempt to quit: 05/05/2001  . Years since quitting: 17.4  Smokeless Tobacco Never Used    Review of Systems  Constitutional: Negative.   HENT: Negative.   Eyes: Negative.   Respiratory: Negative.   Cardiovascular: Negative.   Gastrointestinal: Negative.   Genitourinary: Negative.   Musculoskeletal: Negative.   Skin: Negative.   Neurological: Negative.   Endo/Heme/Allergies: Bruises/bleeds easily.  Psychiatric/Behavioral: Negative.     Objective   Vitals:   10/07/18 1003  BP: (!) 181/91  Pulse: 74  Resp: 18  Temp: (!) 97.5 F (36.4 C)    Physical Exam Vitals signs reviewed.  Constitutional:      Appearance: Normal appearance. She is not ill-appearing.  HENT:     Head: Normocephalic and atraumatic.  Cardiovascular:     Rate and Rhythm: Normal rate and regular rhythm.     Heart sounds: No murmur. No friction rub. No gallop.   Pulmonary:     Effort: Pulmonary effort is normal. No respiratory distress.     Breath sounds: Normal breath sounds. No stridor. No wheezing, rhonchi or rales.  Skin:    General: Skin is warm and dry.  Neurological:     Mental Status: She is alert and oriented to person, place, and time.    Oncology notes reviewed Assessment   Left breast carcinoma, need for central venous access  Plan   Patient is scheduled for Port-A-Cath insertion on 10/15/2018.  The risks and benefits of the procedure including bleeding, infection, and pneumothorax were fully explained to the patient, who gave informed consent.  She will stop her Eliquis 2 days before the surgery.

## 2018-10-07 NOTE — Progress Notes (Signed)
Monique Day; [id number]; March 15, 1936   HPI Patient is an 83 year old white female who was referred to my care by Dr. Melina Copa and oncology for Port-A-Cath placement.  She was recently diagnosed with left breast cancer and is about to undergo chemotherapy.  She has had the Port-A-Cath in place in the remote past.  She currently has 0 out of 10 pain.  She is on Eliquis due to history of DVT. Past Medical History:  Diagnosis Date  . Adult idiopathic generalized osteoporosis   . Arthritis    "minor" (08/18/2018)  . Breast cancer, left breast ([place]) 1998   lumpectomy  . Breast cancer, right breast ([place]) 2009   lumpectomy; mastectomy  . DVT (deep venous thrombosis) ([place]) "early 2000's"   RLE  . DVT (deep venous thrombosis) ([place])   . Hemorrhoids, external   . History of blood transfusion    "related to ulcerative colitis"  . Lower abdominal pain   . Occult blood in stools   . Peripheral edema   . Seizures ([place])    one very slight seizure with the stroke; somewhere between 2005-2009  . Stroke [facility]) 2005-2009   "very light;" ; denies residual on 08/19/2018  . UC (ulcerative colitis) ([place]) dx'd 5784    Past Surgical History:  Procedure Laterality Date  . APPENDECTOMY    . bone density  12/11/10  . BREAST BIOPSY Left 1998; 2019 X 2  . BREAST BIOPSY Right 2009  . BREAST LUMPECTOMY Left 1998  . CATARACT EXTRACTION W/ INTRAOCULAR LENS  IMPLANT, BILATERAL Bilateral   . COLONOSCOPY  02/21/2011  . COLONOSCOPY  10/06/2008  . COLONOSCOPY  12/27/01  . COLONOSCOPY  05/09/05  . COLONOSCOPY N/A 07/02/2016   Procedure: COLONOSCOPY;  Surgeon: [name], MD;  [facility];  Service: Endoscopy;  Laterality: N/A;  1055  . [place]   "I was having rectal bleeding"  . FRACTURE SURGERY    . MASTECTOMY Right 2009  . MASTECTOMY COMPLETE / SIMPLE Left 08/19/2018  . SIMPLE MASTECTOMY WITH AXILLARY SENTINEL NODE BIOPSY Left 08/19/2018   Procedure: LEFT SIMPLE MASTECTOMY;   Surgeon: [name], MD;  Location: [place];  Service: General;  Laterality: Left;  . TONSILLECTOMY    . VENA CAVA FILTER PLACEMENT Right   . WRIST FRACTURE SURGERY Right     Family History  Problem Relation Age of Onset  . Prostate cancer Father   . Colon cancer Neg Hx     Current Outpatient Medications on File Prior to Visit  Medication Sig Dispense Refill  . acetaminophen (TYLENOL) 500 MG tablet Take 500 mg by mouth daily as needed for moderate pain or headache.    Marland Kitchen apixaban (ELIQUIS) 5 MG TABS tablet Take 2 tablets (10 mg total) by mouth 2 (two) times daily. Then 1 tablet (5 mg) two times daily starting 03/16/18 (Patient taking differently: Take 2.5 mg by mouth 2 (two) times daily. ) 73 tablet 0  . Ascorbic Acid (VITAMIN C) 1000 MG tablet Take 1,000 mg by mouth daily.    . Calcium Carb-Cholecalciferol (CALCIUM 500 + D3) 500-600 MG-UNIT TABS Take 2 tablets by mouth daily.    Marland Kitchen denosumab (PROLIA) 60 MG/ML SOLN injection Inject 60 mg into the skin every 6 (six) months. Administer in upper arm, thigh, or abdomen    . exemestane (AROMASIN) 25 MG tablet Take 1 tablet (25 mg total) by mouth daily after breakfast. 30 tablet 6  . HUMIRA PEN 40 MG/0.8ML PNKT INJECT  40 MG SUBQ EVERY 14 DAYS. (Patient taking differently: Inject 40 mg as directed every 14 (fourteen) days. ) 2 each 11  . Magnesium 500 MG TABS Take 500 mg by mouth daily.    . mesalamine (APRISO) 0.375 g 24 hr capsule Take 4 capsules by mouth each morning. 360 capsule 3  . Naphazoline-Pheniramine (OPCON-A) 0.027-0.315 % SOLN Place 1 drop into both eyes daily as needed (dry eyes).     . polyethylene glycol powder (GLYCOLAX/MIRALAX) powder Take 8.5 g by mouth daily as needed. 255 g 0  . PRESCRIPTION MEDICATION Apply 1 application topically as needed (eczema). Ketoconazole-fluticasone 1:1 compounded cream     No current facility-administered medications on file prior to visit.     Allergies  Allergen Reactions  . Mercaptopurine  Other (See Comments)    Dropped WBC  . Sulfa Antibiotics Other (See Comments)    Extreme Weakness    Social History   Substance and Sexual Activity  Alcohol Use Yes   Comment: 08/18/2018 "couple drinks/year"    Social History   Tobacco Use  Smoking Status Former Smoker  . Packs/day: 2.00  . Years: 40.00  . Pack years: 80.00  . Types: Cigarettes  . Last attempt to quit: 05/05/2001  . Years since quitting: 17.4  Smokeless Tobacco Never Used    Review of Systems  Constitutional: Negative.   HENT: Negative.   Eyes: Negative.   Respiratory: Negative.   Cardiovascular: Negative.   Gastrointestinal: Negative.   Genitourinary: Negative.   Musculoskeletal: Negative.   Skin: Negative.   Neurological: Negative.   Endo/Heme/Allergies: Bruises/bleeds easily.  Psychiatric/Behavioral: Negative.     Objective   Vitals:   10/07/18 1003  BP: (!) 181/91  Pulse: 74  Resp: 18  Temp: (!) 97.5 F (36.4 C)    Physical Exam Vitals signs reviewed.  Constitutional:      Appearance: Normal appearance. She is not ill-appearing.  HENT:     Head: Normocephalic and atraumatic.  Cardiovascular:     Rate and Rhythm: Normal rate and regular rhythm.     Heart sounds: No murmur. No friction rub. No gallop.   Pulmonary:     Effort: Pulmonary effort is normal. No respiratory distress.     Breath sounds: Normal breath sounds. No stridor. No wheezing, rhonchi or rales.  Skin:    General: Skin is warm and dry.  Neurological:     Mental Status: She is alert and oriented to person, place, and time.    Oncology notes reviewed Assessment   Left breast carcinoma, need for central venous access  Plan   Patient is scheduled for Port-A-Cath insertion on 10/15/2018.  The risks and benefits of the procedure including bleeding, infection, and pneumothorax were fully explained to the patient, who gave informed consent.  She will stop her Eliquis 2 days before the surgery.

## 2018-10-07 NOTE — Patient Instructions (Addendum)
Stop Eiliquis on Wednesday 10/13/18  Implanted [facility] Guide An implanted port is a device that is placed under the skin. It is usually placed in the chest. The device can be used to give IV medicine, to take blood, or for dialysis. You may have an implanted port if:  You need IV medicine that would be irritating to the small veins in your hands or arms.  You need IV medicines, such as antibiotics, for a long period of time.  You need IV nutrition for a long period of time.  You need dialysis. Having a port means that your health care provider will not need to use the veins in your arms for these procedures. You may have fewer limitations when using a port than you would if you used other types of long-term IVs, and you will likely be able to return to normal activities after your incision heals. An implanted port has two main parts:  Reservoir. The reservoir is the part where a needle is inserted to give medicines or draw blood. The reservoir is round. After it is placed, it appears as a small, raised area under your skin.  Catheter. The catheter is a thin, flexible tube that connects the reservoir to a vein. Medicine that is inserted into the reservoir goes into the catheter and then into the vein. How is my port accessed? To access your port:  A numbing cream may be placed on the skin over the port site.  Your health care provider will put on a mask and sterile gloves.  The skin over your port will be cleaned carefully with a germ-killing soap and allowed to dry.  Your health care provider will gently pinch the port and insert a needle into it.  Your health care provider will check for a blood return to make sure the port is in the vein and is not clogged.  If your port needs to remain accessed to get medicine continuously (constant infusion), your health care provider will place a clear bandage (dressing) over the needle site. The dressing and needle will need to be changed every  week, or as told by your health care provider. What is flushing? Flushing helps keep the port from getting clogged. Follow instructions from your health care provider about how and when to flush the port. Ports are usually flushed with saline solution or a medicine called heparin. The need for flushing will depend on how the port is used:  If the port is only used from time to time to give medicines or draw blood, the port may need to be flushed: ? Before and after medicines have been given. ? Before and after blood has been drawn. ? As part of routine maintenance. Flushing may be recommended every 4-6 weeks.  If a constant infusion is running, the port may not need to be flushed.  Throw away any syringes in a disposal container that is meant for sharp items (sharps container). You can buy a sharps container from a pharmacy, or you can make one by using an empty hard plastic bottle with a cover. How long will my port stay implanted? The port can stay in for as long as your health care provider thinks it is needed. When it is time for the port to come out, a surgery will be done to remove it. The surgery will be similar to the procedure that was done to put the port in. Follow these instructions at home:   Flush your port as  told by your health care provider.  If you need an infusion over several days, follow instructions from your health care provider about how to take care of your port site. Make sure you: ? Wash your hands with soap and water before you change your dressing. If soap and water are not available, use alcohol-based hand sanitizer. ? Change your dressing as told by your health care provider. ? Place any used dressings or infusion bags into a plastic bag. Throw that bag in the trash. ? Keep the dressing that covers the needle clean and dry. Do not get it wet. ? Do not use scissors or sharp objects near the tube. ? Keep the tube clamped, unless it is being used.  Check your  port site every day for signs of infection. Check for: ? Redness, swelling, or pain. ? Fluid or blood. ? Pus or a bad smell.  Protect the skin around the port site. ? Avoid wearing bra straps that rub or irritate the site. ? Protect the skin around your port from seat belts. Place a soft pad over your chest if needed.  Bathe or shower as told by your health care provider. The site may get wet as long as you are not actively receiving an infusion.  Return to your normal activities as told by your health care provider. Ask your health care provider what activities are safe for you.  Carry a medical alert card or wear a medical alert bracelet at all times. This will let health care providers know that you have an implanted port in case of an emergency. Get help right away if:  You have redness, swelling, or pain at the port site.  You have fluid or blood coming from your port site.  You have pus or a bad smell coming from the port site.  You have a fever. Summary  Implanted ports are usually placed in the chest for long-term IV access.  Follow instructions from your health care provider about flushing the port and changing bandages (dressings).  Take care of the area around your port by avoiding clothing that puts pressure on the area, and by watching for signs of infection.  Protect the skin around your port from seat belts. Place a soft pad over your chest if needed.  Get help right away if you have a fever or you have redness, swelling, pain, drainage, or a bad smell at the port site. This information is not intended to replace advice given to you by your health care provider. Make sure you discuss any questions you have with your health care provider. Document Released: 08/04/2005 Document Revised: 09/06/2016 Document Reviewed: 09/06/2016 Elsevier Interactive Patient Education  2019 [employer].

## 2018-10-12 ENCOUNTER — Encounter (HOSPITAL_COMMUNITY)
Admission: RE | Admit: 2018-10-12 | Discharge: 2018-10-12 | Disposition: A | Discharge status: Home / Self Care | Payer: Medicare Other | Source: Ambulatory Visit | Attending: General Surgery | Admitting: General Surgery

## 2018-10-14 ENCOUNTER — Telehealth (INDEPENDENT_AMBULATORY_CARE_PROVIDER_SITE_OTHER): Payer: Self-pay | Admitting: *Deleted

## 2018-10-14 NOTE — Telephone Encounter (Signed)
Patient called and states that she saw her Oncologist and they together have decided for her to get Chemotherapy. She will go tomorrow for the port-a cath tomorrow,10/15/2018. She will get Taxol for 12 weeks , getting 1 infusion per week. Then , she will start the Herceptin being infused every 3 weeks.  They will be giving her Pepcid prior to treatment and there is a drug interaction with this medication and the Apriso. What would Dr.REhman recommend ? She will be receiving her treatment at [facility].  To review with Dr.Rehman. Patient call back number is  [phone].

## 2018-10-14 NOTE — Telephone Encounter (Signed)
Talked with Dr.Rehman. He recommended that the patient stop the Apriso while undergoing the Chemo therapy. Patient was called and made aware , and is in agreement to do this.

## 2018-10-15 ENCOUNTER — Ambulatory Visit (HOSPITAL_COMMUNITY): Payer: Medicare Other | Admitting: Anesthesiology

## 2018-10-15 ENCOUNTER — Ambulatory Visit (HOSPITAL_COMMUNITY): Payer: Medicare Other

## 2018-10-15 ENCOUNTER — Encounter (HOSPITAL_COMMUNITY)
Admission: RE | Disposition: A | Discharge status: Home / Self Care | Payer: Self-pay | Source: Home / Self Care | Attending: General Surgery

## 2018-10-15 ENCOUNTER — Encounter (HOSPITAL_COMMUNITY): Payer: Self-pay | Admitting: Anesthesiology

## 2018-10-15 ENCOUNTER — Ambulatory Visit (HOSPITAL_COMMUNITY)
Admission: RE | Admit: 2018-10-15 | Discharge: 2018-10-15 | Disposition: A | Discharge status: Home / Self Care | Payer: Medicare Other | Attending: General Surgery | Admitting: General Surgery

## 2018-10-15 DIAGNOSIS — F17210 Nicotine dependence, cigarettes, uncomplicated: Secondary | ICD-10-CM | POA: Diagnosis not present

## 2018-10-15 DIAGNOSIS — Z95828 Presence of other vascular implants and grafts: Secondary | ICD-10-CM

## 2018-10-15 DIAGNOSIS — Z888 Allergy status to other drugs, medicaments and biological substances status: Secondary | ICD-10-CM | POA: Insufficient documentation

## 2018-10-15 DIAGNOSIS — Z882 Allergy status to sulfonamides status: Secondary | ICD-10-CM | POA: Insufficient documentation

## 2018-10-15 DIAGNOSIS — C50912 Malignant neoplasm of unspecified site of left female breast: Secondary | ICD-10-CM | POA: Diagnosis present

## 2018-10-15 DIAGNOSIS — C50212 Malignant neoplasm of upper-inner quadrant of left female breast: Secondary | ICD-10-CM

## 2018-10-15 DIAGNOSIS — Z8711 Personal history of peptic ulcer disease: Secondary | ICD-10-CM | POA: Diagnosis not present

## 2018-10-15 DIAGNOSIS — Z9842 Cataract extraction status, left eye: Secondary | ICD-10-CM | POA: Diagnosis not present

## 2018-10-15 DIAGNOSIS — M1990 Unspecified osteoarthritis, unspecified site: Secondary | ICD-10-CM | POA: Insufficient documentation

## 2018-10-15 DIAGNOSIS — Z8719 Personal history of other diseases of the digestive system: Secondary | ICD-10-CM | POA: Insufficient documentation

## 2018-10-15 DIAGNOSIS — Z79811 Long term (current) use of aromatase inhibitors: Secondary | ICD-10-CM | POA: Insufficient documentation

## 2018-10-15 DIAGNOSIS — Z86718 Personal history of other venous thrombosis and embolism: Secondary | ICD-10-CM | POA: Insufficient documentation

## 2018-10-15 DIAGNOSIS — Z9841 Cataract extraction status, right eye: Secondary | ICD-10-CM | POA: Insufficient documentation

## 2018-10-15 DIAGNOSIS — Z853 Personal history of malignant neoplasm of breast: Secondary | ICD-10-CM | POA: Diagnosis not present

## 2018-10-15 DIAGNOSIS — Z9012 Acquired absence of left breast and nipple: Secondary | ICD-10-CM | POA: Diagnosis not present

## 2018-10-15 DIAGNOSIS — Z79899 Other long term (current) drug therapy: Secondary | ICD-10-CM | POA: Insufficient documentation

## 2018-10-15 DIAGNOSIS — Z7901 Long term (current) use of anticoagulants: Secondary | ICD-10-CM | POA: Insufficient documentation

## 2018-10-15 DIAGNOSIS — Z9011 Acquired absence of right breast and nipple: Secondary | ICD-10-CM | POA: Insufficient documentation

## 2018-10-15 DIAGNOSIS — Z961 Presence of intraocular lens: Secondary | ICD-10-CM | POA: Diagnosis not present

## 2018-10-15 HISTORY — PX: PORTACATH PLACEMENT: SHX2246

## 2018-10-15 LAB — PROTIME-INR
INR: 1 (ref 0.8–1.2)
Prothrombin Time: 12.9 seconds (ref 11.4–15.2)

## 2018-10-15 SURGERY — INSERTION, TUNNELED CENTRAL VENOUS DEVICE, WITH PORT
Anesthesia: Monitor Anesthesia Care | Laterality: Right

## 2018-10-15 MED ORDER — PROPOFOL 500 MG/50ML IV EMUL
INTRAVENOUS | Status: DC | PRN
  Administered 2018-10-15: 75 ug/kg/min via INTRAVENOUS

## 2018-10-15 MED ORDER — GLYCOPYRROLATE PF 0.2 MG/ML IJ SOSY
PREFILLED_SYRINGE | INTRAMUSCULAR | Status: AC
  Filled 2018-10-15: qty 1

## 2018-10-15 MED ORDER — KETAMINE HCL 10 MG/ML IJ SOLN
INTRAMUSCULAR | Status: DC | PRN
  Administered 2018-10-15: 10 mg via INTRAVENOUS

## 2018-10-15 MED ORDER — KETAMINE HCL 50 MG/5ML IJ SOSY
PREFILLED_SYRINGE | INTRAMUSCULAR | Status: AC
  Filled 2018-10-15: qty 5

## 2018-10-15 MED ORDER — TRAMADOL HCL 50 MG PO TABS: 50.0000 mg | ORAL_TABLET | Freq: Four times a day (QID) | ORAL | 0 refills | Status: DC | PRN

## 2018-10-15 MED ORDER — GLYCOPYRROLATE 0.2 MG/ML IJ SOLN
INTRAMUSCULAR | Status: DC | PRN
  Administered 2018-10-15: 0.2 mg via INTRAVENOUS

## 2018-10-15 MED ORDER — KETOROLAC TROMETHAMINE 30 MG/ML IJ SOLN: 30.0000 mg | Freq: Once | INTRAMUSCULAR | Status: DC | PRN

## 2018-10-15 MED ORDER — HEPARIN SOD (PORK) LOCK FLUSH 100 UNIT/ML IV SOLN
INTRAVENOUS | Status: AC
  Filled 2018-10-15: qty 5

## 2018-10-15 MED ORDER — KETOROLAC TROMETHAMINE 30 MG/ML IJ SOLN: 15.0000 mg | Freq: Once | INTRAMUSCULAR | Status: DC

## 2018-10-15 MED ORDER — MEPERIDINE HCL 50 MG/ML IJ SOLN: 6.2500 mg | INTRAMUSCULAR | Status: DC | PRN

## 2018-10-15 MED ORDER — CHLORHEXIDINE GLUCONATE CLOTH 2 % EX PADS: 6.0000 | MEDICATED_PAD | Freq: Once | CUTANEOUS | Status: DC

## 2018-10-15 MED ORDER — HYDROCODONE-ACETAMINOPHEN 7.5-325 MG PO TABS: 1.0000 | ORAL_TABLET | Freq: Once | ORAL | Status: DC | PRN

## 2018-10-15 MED ORDER — LIDOCAINE 2% (20 MG/ML) 5 ML SYRINGE
INTRAMUSCULAR | Status: AC
  Filled 2018-10-15: qty 10

## 2018-10-15 MED ORDER — PROPOFOL 10 MG/ML IV BOLUS
INTRAVENOUS | Status: AC
  Filled 2018-10-15: qty 40

## 2018-10-15 MED ORDER — LIDOCAINE HCL (PF) 1 % IJ SOLN
INTRAMUSCULAR | Status: AC
  Filled 2018-10-15: qty 30

## 2018-10-15 MED ORDER — ONDANSETRON HCL 4 MG/2ML IJ SOLN: 4.0000 mg | Freq: Once | INTRAMUSCULAR | Status: DC | PRN

## 2018-10-15 MED ORDER — LIDOCAINE HCL (PF) 1 % IJ SOLN
INTRAMUSCULAR | Status: DC | PRN
  Administered 2018-10-15: 9 mL

## 2018-10-15 MED ORDER — HEPARIN SOD (PORK) LOCK FLUSH 100 UNIT/ML IV SOLN
INTRAVENOUS | Status: DC | PRN
  Administered 2018-10-15: 500 [IU]

## 2018-10-15 MED ORDER — LACTATED RINGERS IV SOLN
INTRAVENOUS | Status: DC
  Administered 2018-10-15: 08:00:00 via INTRAVENOUS

## 2018-10-15 MED ORDER — PROPOFOL 10 MG/ML IV BOLUS
INTRAVENOUS | Status: AC
  Filled 2018-10-15: qty 20

## 2018-10-15 MED ORDER — CEFAZOLIN SODIUM-DEXTROSE 2-4 GM/100ML-% IV SOLN
2.0000 g | INTRAVENOUS | Status: AC
  Administered 2018-10-15: 2 g via INTRAVENOUS
  Filled 2018-10-15: qty 100

## 2018-10-15 MED ORDER — HYDROMORPHONE HCL 1 MG/ML IJ SOLN: 0.2500 mg | INTRAMUSCULAR | Status: DC | PRN

## 2018-10-15 MED ORDER — SODIUM CHLORIDE (PF) 0.9 % IJ SOLN
INTRAMUSCULAR | Status: DC | PRN
  Administered 2018-10-15: 10 mL

## 2018-10-15 SURGICAL SUPPLY — 29 items
BAG DECANTER FOR FLEXI CONT (MISCELLANEOUS) ×3 IMPLANT
CHLORAPREP W/TINT 10.5 ML (MISCELLANEOUS) ×3 IMPLANT
CLOTH BEACON ORANGE TIMEOUT ST (SAFETY) ×3 IMPLANT
COVER LIGHT HANDLE STERIS (MISCELLANEOUS) ×6 IMPLANT
COVER WAND RF STERILE (DRAPES) ×3 IMPLANT
DECANTER SPIKE VIAL GLASS SM (MISCELLANEOUS) ×3 IMPLANT
DERMABOND ADVANCED (GAUZE/BANDAGES/DRESSINGS) ×2
DERMABOND ADVANCED .7 DNX12 (GAUZE/BANDAGES/DRESSINGS) ×1 IMPLANT
DRAPE C-ARM FOLDED MOBILE STRL (DRAPES) ×3 IMPLANT
ELECT REM PT RETURN 9FT ADLT (ELECTROSURGICAL) ×3
ELECTRODE REM PT RTRN 9FT ADLT (ELECTROSURGICAL) ×1 IMPLANT
GLOVE BIOGEL PI IND STRL 7.0 (GLOVE) ×2 IMPLANT
GLOVE BIOGEL PI INDICATOR 7.0 (GLOVE) ×4
GLOVE SURG SS PI 7.5 STRL IVOR (GLOVE) ×3 IMPLANT
GOWN STRL REUS W/TWL LRG LVL3 (GOWN DISPOSABLE) ×6 IMPLANT
IV NS 500ML (IV SOLUTION) ×2
IV NS 500ML BAXH (IV SOLUTION) ×1 IMPLANT
KIT PORT POWER 8FR ISP MRI (Port) ×3 IMPLANT
KIT TURNOVER KIT A (KITS) ×3 IMPLANT
NEEDLE HYPO 25X1 1.5 SAFETY (NEEDLE) ×3 IMPLANT
PACK MINOR (CUSTOM PROCEDURE TRAY) ×3 IMPLANT
PAD ARMBOARD 7.5X6 YLW CONV (MISCELLANEOUS) ×3 IMPLANT
SET BASIN LINEN APH (SET/KITS/TRAYS/PACK) ×3 IMPLANT
SUT MNCRL AB 4-0 PS2 18 (SUTURE) ×3 IMPLANT
SUT VIC AB 3-0 SH 27 (SUTURE) ×2
SUT VIC AB 3-0 SH 27X BRD (SUTURE) ×1 IMPLANT
SYR 20CC LL (SYRINGE) ×3 IMPLANT
SYR 5ML LL (SYRINGE) ×3 IMPLANT
SYR CONTROL 10ML LL (SYRINGE) ×3 IMPLANT

## 2018-10-15 NOTE — Anesthesia Procedure Notes (Signed)
Procedure Name: MAC Date/Time: 10/15/2018 8:38 AM Performed by: Andree Elk Jadalyn Oliveri A, CRNA Pre-anesthesia Checklist: Patient identified, Emergency Drugs available, Suction available, Timeout performed and Patient being monitored Patient Re-evaluated:Patient Re-evaluated prior to induction Oxygen Delivery Method: Nasal Cannula

## 2018-10-15 NOTE — Anesthesia Postprocedure Evaluation (Signed)
Anesthesia Post Note  Patient: Monique Day  Procedure(s) Performed: INSERTION PORT-A-CATH RIGHT SUBCLAVIN (Right )  Patient location during evaluation: PACU Anesthesia Type: MAC Level of consciousness: awake and alert and oriented Pain management: pain level controlled Vital Signs Assessment: post-procedure vital signs reviewed and stable Respiratory status: spontaneous breathing Cardiovascular status: stable Postop Assessment: no apparent nausea or vomiting Anesthetic complications: no     Last Vitals:  Vitals:   10/15/18 0744  BP: (!) 152/71  Pulse: 62  Resp: 18  Temp: (!) 36.4 C  SpO2: 100%    Last Pain:  Vitals:   10/15/18 0744  PainSc: 0-No pain                 ADAMS, AMY A

## 2018-10-15 NOTE — Discharge Instructions (Signed)
Restart eliquis tomorrow 10/16/18    Implanted [facility] Guide An implanted port is a device that is placed under the skin. It is usually placed in the chest. The device can be used to give IV medicine, to take blood, or for dialysis. You may have an implanted port if:  You need IV medicine that would be irritating to the small veins in your hands or arms.  You need IV medicines, such as antibiotics, for a long period of time.  You need IV nutrition for a long period of time.  You need dialysis. Having a port means that your health care provider will not need to use the veins in your arms for these procedures. You may have fewer limitations when using a port than you would if you used other types of long-term IVs, and you will likely be able to return to normal activities after your incision heals. An implanted port has two main parts:  Reservoir. The reservoir is the part where a needle is inserted to give medicines or draw blood. The reservoir is round. After it is placed, it appears as a small, raised area under your skin.  Catheter. The catheter is a thin, flexible tube that connects the reservoir to a vein. Medicine that is inserted into the reservoir goes into the catheter and then into the vein. How is my port accessed? To access your port:  A numbing cream may be placed on the skin over the port site.  Your health care provider will put on a mask and sterile gloves.  The skin over your port will be cleaned carefully with a germ-killing soap and allowed to dry.  Your health care provider will gently pinch the port and insert a needle into it.  Your health care provider will check for a blood return to make sure the port is in the vein and is not clogged.  If your port needs to remain accessed to get medicine continuously (constant infusion), your health care provider will place a clear bandage (dressing) over the needle site. The dressing and needle will need to be changed  every week, or as told by your health care provider. What is flushing? Flushing helps keep the port from getting clogged. Follow instructions from your health care provider about how and when to flush the port. Ports are usually flushed with saline solution or a medicine called heparin. The need for flushing will depend on how the port is used:  If the port is only used from time to time to give medicines or draw blood, the port may need to be flushed: ? Before and after medicines have been given. ? Before and after blood has been drawn. ? As part of routine maintenance. Flushing may be recommended every 4-6 weeks.  If a constant infusion is running, the port may not need to be flushed.  Throw away any syringes in a disposal container that is meant for sharp items (sharps container). You can buy a sharps container from a pharmacy, or you can make one by using an empty hard plastic bottle with a cover. How long will my port stay implanted? The port can stay in for as long as your health care provider thinks it is needed. When it is time for the port to come out, a surgery will be done to remove it. The surgery will be similar to the procedure that was done to put the port in. Follow these instructions at home:   Flush your port  as told by your health care provider.  If you need an infusion over several days, follow instructions from your health care provider about how to take care of your port site. Make sure you: ? Wash your hands with soap and water before you change your dressing. If soap and water are not available, use alcohol-based hand sanitizer. ? Change your dressing as told by your health care provider. ? Place any used dressings or infusion bags into a plastic bag. Throw that bag in the trash. ? Keep the dressing that covers the needle clean and dry. Do not get it wet. ? Do not use scissors or sharp objects near the tube. ? Keep the tube clamped, unless it is being used.  Check  your port site every day for signs of infection. Check for: ? Redness, swelling, or pain. ? Fluid or blood. ? Pus or a bad smell.  Protect the skin around the port site. ? Avoid wearing bra straps that rub or irritate the site. ? Protect the skin around your port from seat belts. Place a soft pad over your chest if needed.  Bathe or shower as told by your health care provider. The site may get wet as long as you are not actively receiving an infusion.  Return to your normal activities as told by your health care provider. Ask your health care provider what activities are safe for you.  Carry a medical alert card or wear a medical alert bracelet at all times. This will let health care providers know that you have an implanted port in case of an emergency. Get help right away if:  You have redness, swelling, or pain at the port site.  You have fluid or blood coming from your port site.  You have pus or a bad smell coming from the port site.  You have a fever. Summary  Implanted ports are usually placed in the chest for long-term IV access.  Follow instructions from your health care provider about flushing the port and changing bandages (dressings).  Take care of the area around your port by avoiding clothing that puts pressure on the area, and by watching for signs of infection.  Protect the skin around your port from seat belts. Place a soft pad over your chest if needed.  Get help right away if you have a fever or you have redness, swelling, pain, drainage, or a bad smell at the port site. This information is not intended to replace advice given to you by your health care provider. Make sure you discuss any questions you have with your health care provider. Document Released: 08/04/2005 Document Revised: 09/06/2016 Document Reviewed: 09/06/2016 Elsevier Interactive Patient Education  2019 [place], Adult An incision is a cut that a doctor makes in your  skin for surgery (for a procedure). Most times, these cuts are closed after surgery. Your cut from surgery may be closed with stitches (sutures), staples, skin glue, or skin tape (adhesive strips). You may need to return to your doctor to have stitches or staples taken out. This may happen many days or many weeks after your surgery. The cut needs to be well cared for so it does not get infected. How to care for your cut Cut care   Follow instructions from your doctor about how to take care of your cut. Make sure you: ? Wash your hands with soap and water before you change your bandage (dressing). If you cannot use soap and  water, use hand sanitizer. ? Change your bandage as told by your doctor. ? Leave stitches, skin glue, or skin tape in place. They may need to stay in place for 2 weeks or longer. If tape strips get loose and curl up, you may trim the loose edges. Do not remove tape strips completely unless your doctor says it is okay.  Check your cut area every day for signs of infection. Check for: ? More redness, swelling, or pain. ? More fluid or blood. ? Warmth. ? Pus or a bad smell.  Ask your doctor how to clean the cut. This may include: ? Using mild soap and water. ? Using a clean towel to pat the cut dry after you clean it. ? Putting a cream or ointment on the cut. Do this only as told by your doctor. ? Covering the cut with a clean bandage.  Ask your doctor when you can leave the cut uncovered.  Do not take baths, swim, or use a hot tub until your doctor says it is okay. Ask your doctor if you can take showers. You may only be allowed to take sponge baths for bathing. Medicines  If you were prescribed an antibiotic medicine, cream, or ointment, take the antibiotic or put it on the cut as told by your doctor. Do not stop taking or putting on the antibiotic even if your condition gets better.  Take over-the-counter and prescription medicines only as told by your doctor. General  instructions  Limit movement around your cut. This helps healing. ? Avoid straining, lifting, or exercise for the first month, or for as long as told by your doctor. ? Follow instructions from your doctor about going back to your normal activities. ? Ask your doctor what activities are safe.  Protect your cut from the sun when you are outside for the first 6 months, or for as long as told by your doctor. Put on sunscreen around the scar or cover up the scar.  Keep all follow-up visits as told by your doctor. This is important. Contact a doctor if:  Your have more redness, swelling, or pain around the cut.  You have more fluid or blood coming from the cut.  Your cut feels warm to the touch.  You have pus or a bad smell coming from the cut.  You have a fever or shaking chills.  You feel sick to your stomach (nauseous) or you throw up (vomit).  You are dizzy.  Your stitches or staples come undone. Get help right away if:  You have a red streak coming from your cut.  Your cut bleeds through the bandage and the bleeding does not stop with gentle pressure.  The edges of your cut open up and separate.  You have very bad (severe) pain.  You have a rash.  You are confused.  You pass out (faint).  You have trouble breathing and you have a fast heartbeat. This information is not intended to replace advice given to you by your health care provider. Make sure you discuss any questions you have with your health care provider. Document Released: 10/27/2011 Document Revised: 04/11/2016 Document Reviewed: 04/11/2016 Elsevier Interactive Patient Education  2019 [employer]  PATIENT INSTRUCTIONS POST-ANESTHESIA  IMMEDIATELY FOLLOWING SURGERY:  Do not drive or operate machinery for the first twenty four hours after surgery.  Do not make any important decisions for twenty four hours after surgery or while taking narcotic pain medications or sedatives.  If you develop intractable  nausea and vomiting or a severe headache please notify your doctor immediately.  FOLLOW-UP:  Please make an appointment with your surgeon as instructed. You do not need to follow up with anesthesia unless specifically instructed to do so.  WOUND CARE INSTRUCTIONS (if applicable):  Keep a dry clean dressing on the anesthesia/puncture wound site if there is drainage.  Once the wound has quit draining you may leave it open to air.  Generally you should leave the bandage intact for twenty four hours unless there is drainage.  If the epidural site drains for more than 36-48 hours please call the anesthesia department.  QUESTIONS?:  Please feel free to call your physician or the hospital operator if you have any questions, and they will be happy to assist you.

## 2018-10-15 NOTE — Op Note (Signed)
Patient:  Monique Day  DOB:  1935/09/01  MRN:  [id number]   Preop Diagnosis: Left breast carcinoma, need for central venous access  Postop Diagnosis: Same  Procedure: Port-A-Cath insertion  Surgeon: [name], MD  Anes: MAC  Indications: Patient is an 83 year old white female who is about to undergo chemotherapy for left breast carcinoma.  The risks and benefits of the procedure including bleeding, infection, and pneumothorax were fully explained to the patient, who gave informed consent.  Procedure note: The patient was placed in Trendelenburg position after the right upper chest was prepped and draped using usual sterile technique with ChloraPrep.  Surgical site confirmation was performed.  1% Xylocaine was used for local anesthesia.  Incision was made below the right clavicle.  A subcutaneous pocket was formed.  A needle was advanced into the right subclavian vein using the Seldinger technique without difficulty.  A guidewire was then advanced into the right atrium under fluoroscopic guidance.  An introducer and peel-away sheath were placed over the guidewire.  The catheter was then inserted through the peel-away sheath and the peel-away sheath was removed.  The catheter was then attached to the port and the port placed in subcutaneous pocket.  Adequate positioning was confirmed by fluoroscopy.  Good backflow of venous blood was noted on aspiration of the port.  The port was flushed with heparin flush.  The subcutaneous layer was reapproximated using a 3-0 Vicryl interrupted suture.  The skin was closed using a 4-0 Monocryl subcuticular suture.  Dermabond was applied.  All tape and needle counts were correct at the end of the procedure.  The patient was awakened and transferred to PACU in stable condition.  A chest x-ray will be performed at that time.  Complications: None  EBL: Minimal  Specimen: None

## 2018-10-15 NOTE — Interval H&P Note (Signed)
History and Physical Interval Note:  10/15/2018 7:52 AM  Monique Day  has presented today for surgery, with the diagnosis of left breast cancer  The various methods of treatment have been discussed with the patient and family. After consideration of risks, benefits and other options for treatment, the patient has consented to  Procedure(s) with comments: INSERTION PORT-A-CATH (Right) - pt knows to arrive at 7:30 as a surgical intervention .  The patient's history has been reviewed, patient examined, no change in status, stable for surgery.  I have reviewed the patient's chart and labs.  Questions were answered to the patient's satisfaction.     [name]

## 2018-10-15 NOTE — Anesthesia Preprocedure Evaluation (Addendum)
Anesthesia Evaluation  Patient identified by MRN, date of birth, ID band Patient awake    Reviewed: Allergy & Precautions, H&P , NPO status , Patient's Chart, lab work & pertinent test results  Airway Mallampati: II  TM Distance: >3 FB Neck ROM: full    Dental no notable dental hx.    Pulmonary neg pulmonary ROS, former smoker,    Pulmonary exam normal breath sounds clear to auscultation       Cardiovascular Exercise Tolerance: Good + dysrhythmias  Rhythm:regular Rate:Normal  The left ventricle has normal systolic function with an ejection fraction of 60-65%   Neuro/Psych Seizures -,  Sz assoc with CVA, none since CVA negative psych ROS   GI/Hepatic Neg liver ROS, PUD,   Endo/Other  negative endocrine ROS  Renal/GU negative Renal ROS  negative genitourinary   Musculoskeletal  (+) Arthritis ,   Abdominal   Peds  Hematology  (+) Blood dyscrasia, anemia ,   Anesthesia Other Findings   Reproductive/Obstetrics negative OB ROS                            Anesthesia Physical Anesthesia Plan  ASA: III  Anesthesia Plan: MAC   Post-op Pain Management:    Induction:   PONV Risk Score and Plan:   Airway Management Planned:   Additional Equipment:   Intra-op Plan:   Post-operative Plan:   Informed Consent: I have reviewed the patients History and Physical, chart, labs and discussed the procedure including the risks, benefits and alternatives for the proposed anesthesia with the patient or authorized representative who has indicated his/her understanding and acceptance.     Dental Advisory Given  Plan Discussed with: CRNA  Anesthesia Plan Comments:         Anesthesia Quick Evaluation

## 2018-10-15 NOTE — Transfer of Care (Signed)
Immediate Anesthesia Transfer of Care Note  Patient: Masey Scheiber  Procedure(s) Performed: INSERTION PORT-A-CATH RIGHT SUBCLAVIN (Right )  Patient Location: PACU  Anesthesia Type:MAC  Level of Consciousness: awake, alert , oriented and patient cooperative  Airway & Oxygen Therapy: Patient Spontanous Breathing  Post-op Assessment: Report given to RN and Post -op Vital signs reviewed and stable  Post vital signs: Reviewed and stable  Last Vitals:  Vitals Value Taken Time  BP 147/67 10/15/2018  9:23 AM  Temp    Pulse 74 10/15/2018  9:25 AM  Resp 12 10/15/2018  9:25 AM  SpO2 100 % 10/15/2018  9:25 AM  Vitals shown include unvalidated device data.  Last Pain:  Vitals:   10/15/18 0744  PainSc: 0-No pain         Complications: No apparent anesthesia complications

## 2018-10-18 ENCOUNTER — Encounter (HOSPITAL_COMMUNITY): Payer: Self-pay | Admitting: General Surgery

## 2018-10-20 ENCOUNTER — Other Ambulatory Visit (HOSPITAL_COMMUNITY): Payer: Medicare Other

## 2018-10-20 ENCOUNTER — Ambulatory Visit (HOSPITAL_COMMUNITY): Payer: Medicare Other | Admitting: Hematology

## 2018-10-20 ENCOUNTER — Ambulatory Visit (HOSPITAL_COMMUNITY): Payer: Medicare Other

## 2018-10-21 ENCOUNTER — Other Ambulatory Visit (HOSPITAL_COMMUNITY): Payer: Medicare Other

## 2018-10-22 ENCOUNTER — Inpatient Hospital Stay (HOSPITAL_COMMUNITY): Payer: Medicare Other | Attending: Hematology

## 2018-10-22 ENCOUNTER — Encounter (HOSPITAL_COMMUNITY): Payer: Self-pay

## 2018-10-22 ENCOUNTER — Other Ambulatory Visit: Payer: Self-pay

## 2018-10-22 ENCOUNTER — Inpatient Hospital Stay (HOSPITAL_COMMUNITY): Payer: Medicare Other

## 2018-10-22 ENCOUNTER — Encounter (HOSPITAL_COMMUNITY): Payer: Self-pay | Admitting: Hematology

## 2018-10-22 ENCOUNTER — Inpatient Hospital Stay (HOSPITAL_COMMUNITY): Payer: Medicare Other | Admitting: Hematology

## 2018-10-22 VITALS — BP 151/77 | HR 82 | Temp 98.7°F | Resp 18 | Wt 175.0 lb

## 2018-10-22 DIAGNOSIS — C50212 Malignant neoplasm of upper-inner quadrant of left female breast: Secondary | ICD-10-CM | POA: Diagnosis not present

## 2018-10-22 DIAGNOSIS — Z170 Estrogen receptor positive status [ER+]: Secondary | ICD-10-CM

## 2018-10-22 DIAGNOSIS — E875 Hyperkalemia: Secondary | ICD-10-CM | POA: Diagnosis not present

## 2018-10-22 DIAGNOSIS — Z5112 Encounter for antineoplastic immunotherapy: Secondary | ICD-10-CM | POA: Diagnosis not present

## 2018-10-22 DIAGNOSIS — I82431 Acute embolism and thrombosis of right popliteal vein: Secondary | ICD-10-CM

## 2018-10-22 DIAGNOSIS — I82411 Acute embolism and thrombosis of right femoral vein: Secondary | ICD-10-CM

## 2018-10-22 DIAGNOSIS — Z5111 Encounter for antineoplastic chemotherapy: Secondary | ICD-10-CM | POA: Insufficient documentation

## 2018-10-22 DIAGNOSIS — Z87891 Personal history of nicotine dependence: Secondary | ICD-10-CM

## 2018-10-22 DIAGNOSIS — K5790 Diverticulosis of intestine, part unspecified, without perforation or abscess without bleeding: Secondary | ICD-10-CM

## 2018-10-22 DIAGNOSIS — M810 Age-related osteoporosis without current pathological fracture: Secondary | ICD-10-CM

## 2018-10-22 DIAGNOSIS — Z853 Personal history of malignant neoplasm of breast: Secondary | ICD-10-CM

## 2018-10-22 LAB — COMPREHENSIVE METABOLIC PANEL
ALT: 13 U/L (ref 0–44)
AST: 19 U/L (ref 15–41)
Albumin: 4.1 g/dL (ref 3.5–5.0)
Alkaline Phosphatase: 36 U/L — ABNORMAL LOW (ref 38–126)
Anion gap: 6 (ref 5–15)
BUN: 23 mg/dL (ref 8–23)
CO2: 26 mmol/L (ref 22–32)
Calcium: 8.5 mg/dL — ABNORMAL LOW (ref 8.9–10.3)
Chloride: 107 mmol/L (ref 98–111)
Creatinine, Ser: 0.94 mg/dL (ref 0.44–1.00)
GFR calc Af Amer: >60 mL/min (ref >60)
GFR calc non Af Amer: 57 mL/min — ABNORMAL LOW (ref >60)
Glucose, Bld: 102 mg/dL — ABNORMAL HIGH (ref 70–99)
Potassium: 5.4 mmol/L — ABNORMAL HIGH (ref 3.5–5.1)
Sodium: 139 mmol/L (ref 135–145)
Total Bilirubin: 0.6 mg/dL (ref 0.3–1.2)
Total Protein: 7.6 g/dL (ref 6.5–8.1)

## 2018-10-22 LAB — CBC WITH DIFFERENTIAL/PLATELET
Abs Immature Granulocytes: 0.01 10*3/uL (ref 0.00–0.07)
Basophils Absolute: 0 10*3/uL (ref 0.0–0.1)
Basophils Relative: 1 %
Eosinophils Absolute: 0.2 10*3/uL (ref 0.0–0.5)
Eosinophils Relative: 4 %
HCT: 40.5 % (ref 36.0–46.0)
Hemoglobin: 12.7 g/dL (ref 12.0–15.0)
Immature Granulocytes: 0 %
Lymphocytes Relative: 35 %
Lymphs Abs: 2 10*3/uL (ref 0.7–4.0)
MCH: 29.8 pg (ref 26.0–34.0)
MCHC: 31.4 g/dL (ref 30.0–36.0)
MCV: 95.1 fL (ref 80.0–100.0)
Monocytes Absolute: 0.6 10*3/uL (ref 0.1–1.0)
Monocytes Relative: 11 %
Neutro Abs: 2.9 10*3/uL (ref 1.7–7.7)
Neutrophils Relative %: 49 %
Platelets: 209 10*3/uL (ref 150–400)
RBC: 4.26 MIL/uL (ref 3.87–5.11)
RDW: 12.8 % (ref 11.5–15.5)
WBC: 5.7 10*3/uL (ref 4.0–10.5)
nRBC: 0 % (ref 0.0–0.2)

## 2018-10-22 MED ORDER — TRASTUZUMAB CHEMO 150 MG IV SOLR
300.0000 mg | Freq: Once | INTRAVENOUS | Status: AC
  Administered 2018-10-22: 300 mg via INTRAVENOUS
  Filled 2018-10-22: qty 14.29

## 2018-10-22 MED ORDER — SODIUM CHLORIDE 0.9 % IV SOLN
40.0000 mg/m2 | Freq: Once | INTRAVENOUS | Status: DC
  Administered 2018-10-22: 72 mg via INTRAVENOUS
  Filled 2018-10-22: qty 12

## 2018-10-22 MED ORDER — HEPARIN SOD (PORK) LOCK FLUSH 100 UNIT/ML IV SOLN
500.0000 [IU] | Freq: Once | INTRAVENOUS | Status: AC
  Administered 2018-10-22: 500 [IU] via INTRAVENOUS

## 2018-10-22 MED ORDER — DEXTROSE 50 % IV SOLN
1.0000 | Freq: Once | INTRAVENOUS | Status: AC
  Administered 2018-10-22: 50 mL via INTRAVENOUS
  Filled 2018-10-22: qty 50

## 2018-10-22 MED ORDER — SODIUM CHLORIDE 0.9 % IV SOLN
Freq: Once | INTRAVENOUS | Status: AC
  Administered 2018-10-22: 11:00:00 via INTRAVENOUS

## 2018-10-22 MED ORDER — HEPARIN SOD (PORK) LOCK FLUSH 100 UNIT/ML IV SOLN
500.0000 [IU] | Freq: Once | INTRAVENOUS | Status: DC | PRN
  Filled 2018-10-22: qty 5

## 2018-10-22 MED ORDER — SODIUM CHLORIDE 0.9 % IV SOLN
20.0000 mg | Freq: Once | INTRAVENOUS | Status: AC
  Administered 2018-10-22: 20 mg via INTRAVENOUS
  Filled 2018-10-22: qty 2

## 2018-10-22 MED ORDER — INSULIN ASPART 100 UNIT/ML IV SOLN
10.0000 [IU] | Freq: Once | INTRAVENOUS | Status: AC
  Administered 2018-10-22: 10 [IU] via INTRAVENOUS
  Filled 2018-10-22: qty 0.1

## 2018-10-22 MED ORDER — ACETAMINOPHEN 325 MG PO TABS
650.0000 mg | ORAL_TABLET | Freq: Once | ORAL | Status: AC
  Administered 2018-10-22: 650 mg via ORAL
  Filled 2018-10-22: qty 2

## 2018-10-22 MED ORDER — FAMOTIDINE IN NACL 20-0.9 MG/50ML-% IV SOLN
20.0000 mg | Freq: Once | INTRAVENOUS | Status: AC
  Administered 2018-10-22: 20 mg via INTRAVENOUS
  Filled 2018-10-22: qty 50

## 2018-10-22 MED ORDER — DIPHENHYDRAMINE HCL 50 MG/ML IJ SOLN
50.0000 mg | Freq: Once | INTRAMUSCULAR | Status: AC
  Administered 2018-10-22: 50 mg via INTRAVENOUS
  Filled 2018-10-22: qty 1

## 2018-10-22 NOTE — Patient Instructions (Addendum)
[place] at [facility] Discharge Instructions  You were seen today by Dr. Delton Coombes, he went over how you've been feeling . He went over your recent labs and everything looks good. He went over side effects you may have and medications to take to help with those. Continue taking your Aromasin, calcium and vit D twice a day and your Prolia injections. We will see you back in 1 week for labs, treatment and follow up.   Thank you for choosing [place] at [facility] to provide your oncology and hematology care.  To afford each patient quality time with our provider, please arrive at least 15 minutes before your scheduled appointment time.   If you have a lab appointment with the [place] please come in thru the  Main Entrance and check in at the main information desk  You need to re-schedule your appointment should you arrive 10 or more minutes late.  We strive to give you quality time with our providers, and arriving late affects you and other patients whose appointments are after yours.  Also, if you no show three or more times for appointments you may be dismissed from the clinic at the providers discretion.     Again, thank you for choosing [facility].  Our hope is that these requests will decrease the amount of time that you wait before being seen by our physicians.       _____________________________________________________________  Should you have questions after your visit to [facility], please contact our office at [phone] between the hours of 8:00 a.m. and 4:30 p.m.  Voicemails left after 4:00 p.m. will not be returned until the following business day.  For prescription refill requests, have your pharmacy contact our office and allow 72 hours.    Cancer Center Support Programs:   > Cancer Support Group  2nd Tuesday of the month 1pm-2pm, Journey Room

## 2018-10-22 NOTE — Assessment & Plan Note (Signed)
1.  Stage I (PT1CNX) HER-2 positive left breast cancer: - Abnormal mammogram at [facility], followed by ultrasound-guided biopsy on 06/23/2018.  IDC, HER-2 positive, ER/PR positive, Ki 67 2%. - Stereotactic biopsy in [place] on 06/29/2018, of the left breast irregular mass within the posterior upper, slightly inner quadrant.  A coil-shaped clip was placed. 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  Ki 67 was 10%. - Patient had a history of left breast cancer in 1998, status post lumpectomy, followed by chemotherapy and radiation, 1 year of tamoxifen which was switched to anastrozole for subsequent 4 years due to intolerance. - She had a history of right breast cancer in 2009, status post mastectomy.  She did not receive any adjuvant chemotherapy or radiation. - She was started on anastrozole on 07/21/2018, she took it for 2 weeks and felt very emotional.  She stopped taking it. -Left mastectomy on 08/19/2018, 1.1 cm IDC, grade 1, margins negative.  No lymph nodes were identified.,  ER was 100% positive, PR 7% positive and HER-2 3+ positive.  - I have called and talked to Dr. Claudette Laws (pathologist).  He clarified that there was an initial biopsy done on 06/23/2018, at [facility].  This report is not uploaded on epic.  The initial biopsy was reportedly positive for HER-2. -The second biopsy on 06/29/2018 was negative for HER-2.  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 - The second focus was not clearly identified on the mastectomy specimen. - I have recommended adjuvant chemotherapy with weekly paclitaxel for 12 cycles and Herceptin for 1 year.  - I reviewed her 2D echocardiogram dated 10/07/2018 which shows EF of 60 to 65%. - I reviewed her blood work today.  Potassium is slightly high at 5.4.  We will give her insulin and D50.  She is not on any potassium supplements. -We reviewed the side  effects of paclitaxel and Herceptin in detail.  She was told to use Imodium and Compazine as needed. -I will start her on 50% dose of paclitaxel and increase it as she tolerates well. -I will see her back in 1 week for follow-up and her second treatment.  2.  Osteoporosis: - She had DEXA scan done at [facility].  She receives Prolia every 6 months at Dr. Carmie End office.   -Calcium today was normal.  She will continue calcium and vitamin D twice daily.   3.  Recurrent DVT: - Right leg femoral and popliteal DVT, diagnosed on 03/08/2018.  She is currently on Eliquis. - She reported having leg swellings and decreased mobility prior to most recent DVT. - She had a history of DVT in 2000's, was treated on Coumadin until 5 years ago.  This was stopped secondary to a bleed complicated by her ulcerative colitis. - She was on a one-week bus trip to [place] when she first developed her blood clot.  4.  Ulcerative colitis: -She is currently on mesalamine and Humira.  This is fairly well controlled. -We will watch her closely as Herceptin can cause diarrhea.

## 2018-10-22 NOTE — Progress Notes (Signed)
Pt seen by Dr. Delton Coombes today. VSS. MAR reviewed. Labs reviewed with Dr. Delton Coombes. K+ 5.4. VO received.   Treatment given today per MD orders. Tolerated infusion without adverse affects. Vital signs stable. No complaints at this time. Discharged from clinic ambulatory. F/U with [facility] as scheduled.

## 2018-10-22 NOTE — Progress Notes (Signed)
[place], [place] [address]   CLINIC:  Medical Oncology/Hematology  PCP:  Octavio Graves, DO [address] [place] [address] [phone]   REASON FOR VISIT: Chemotherapy for HER-2 positive left breast cancer.  CURRENT THERAPY: Paclitaxel and Herceptin weekly.  INTERVAL HISTORY:  Monique Day 83 y.o. female returns for follow-up of breast cancer.  She is due to start her first cycle of chemotherapy today.  Denies any diarrhea.  Denies taking any potassium supplements.  Denies any chest pains or lightheadedness.  Energy and appetite was 100%.   REVIEW OF SYSTEMS:  Review of Systems  All other systems reviewed and are negative.    PAST MEDICAL/SURGICAL HISTORY:  Past Medical History:  Diagnosis Date  . Adult idiopathic generalized osteoporosis   . Arthritis    "minor" (08/18/2018)  . Breast cancer, left breast ([place]) 1998   lumpectomy  . Breast cancer, right breast ([place]) 2009   lumpectomy; mastectomy  . DVT (deep venous thrombosis) ([place]) "early 2000's"   RLE  . DVT (deep venous thrombosis) ([place])   . Hemorrhoids, external   . History of blood transfusion    "related to ulcerative colitis"  . Lower abdominal pain   . Occult blood in stools   . Peripheral edema   . Seizures ([place])    one very slight seizure with the stroke; somewhere between 2005-2009  . Stroke [facility]) 2005-2009   "very light;" ; denies residual on 08/19/2018  . UC (ulcerative colitis) ([place]) dx'd 1749   Past Surgical History:  Procedure Laterality Date  . APPENDECTOMY    . bone density  12/11/10  . BREAST BIOPSY Left 1998; 2019 X 2  . BREAST BIOPSY Right 2009  . BREAST LUMPECTOMY Left 1998  . CATARACT EXTRACTION W/ INTRAOCULAR LENS  IMPLANT, BILATERAL Bilateral   . COLONOSCOPY  02/21/2011  . COLONOSCOPY  10/06/2008  . COLONOSCOPY  12/27/01  . COLONOSCOPY  05/09/05  . COLONOSCOPY N/A 07/02/2016   Procedure: COLONOSCOPY;  Surgeon: [name], MD;   Location: [facility];  Service: Endoscopy;  Laterality: N/A;  1055  . [place]   "I was having rectal bleeding"  . FRACTURE SURGERY    . MASTECTOMY Right 2009  . MASTECTOMY COMPLETE / SIMPLE Left 08/19/2018  . PORTACATH PLACEMENT Right 10/15/2018   Procedure: INSERTION PORT-A-CATH RIGHT SUBCLAVIN;  Surgeon: [name], MD;  Location: [facility] ORS;  Service: General;  Laterality: Right;  pt knows to arrive at 7:30  . SIMPLE MASTECTOMY WITH AXILLARY SENTINEL NODE BIOPSY Left 08/19/2018   Procedure: LEFT SIMPLE MASTECTOMY;  Surgeon: [name], MD;  Location: [facility];  Service: General;  Laterality: Left;  . TONSILLECTOMY    . VENA CAVA FILTER PLACEMENT Right   . WRIST FRACTURE SURGERY Right      SOCIAL HISTORY:  Social History   Socioeconomic History  . Marital status: Widowed    Spouse name: Not on file  . Number of children: 0  . Years of education: Not on file  . Highest education level: Not on file  Occupational History  . Occupation: Optometrist: [employer]  Social Needs  . Financial resource strain: Not hard at all  . Food insecurity:    Worry: Never true    Inability: Never true  . Transportation needs:    Medical: No    Non-medical: No  Tobacco Use  . Smoking status: Former  Smoker    Packs/day: 2.00    Years: 40.00    Pack years: 80.00    Types: Cigarettes    Last attempt to quit: 05/05/2001    Years since quitting: 17.4  . Smokeless tobacco: Never Used  Substance and Sexual Activity  . Alcohol use: Yes    Comment: 08/18/2018 "couple drinks/year"  . Drug use: Never  . Sexual activity: Not Currently  Lifestyle  . Physical activity:    Days per week: 0 days    Minutes per session: 0 min  . Stress: Not at all  Relationships  . Social connections:    Talks on phone: More than three times a week    Gets together: More than three times a week    Attends religious service: More than 4 times per year     Active member of club or organization: Yes    Attends meetings of clubs or organizations: More than 4 times per year    Relationship status: Widowed  . Intimate partner violence:    Fear of current or ex partner: No    Emotionally abused: No    Physically abused: No    Forced sexual activity: No  Other Topics Concern  . Not on file  Social History Narrative  . Not on file    FAMILY HISTORY:  Family History  Problem Relation Age of Onset  . Prostate cancer Father   . Colon cancer Neg Hx     CURRENT MEDICATIONS:  Outpatient Encounter Medications as of 10/22/2018  Medication Sig Note  . acetaminophen (TYLENOL) 500 MG tablet Take 500 mg by mouth every 6 (six) hours as needed for moderate pain or headache.    Marland Kitchen apixaban (ELIQUIS) 5 MG TABS tablet Take 2 tablets (10 mg total) by mouth 2 (two) times daily. Then 1 tablet (5 mg) two times daily starting 03/16/18 (Patient taking differently: Take 2.5 mg by mouth 2 (two) times daily. )   . Ascorbic Acid (VITAMIN C) 1000 MG tablet Take 1,000 mg by mouth daily.   . Calcium Carb-Cholecalciferol (CALCIUM 500 + D3) 500-600 MG-UNIT TABS Take 1 tablet by mouth daily.    Marland Kitchen denosumab (PROLIA) 60 MG/ML SOLN injection Inject 60 mg into the skin every 6 (six) months. Administer in upper arm, thigh, or abdomen 10/07/2018: LAST DOSE:09/2018  . HUMIRA PEN 40 MG/0.8ML PNKT INJECT 40 MG SUBQ EVERY 14 DAYS. (Patient taking differently: Inject 40 mg as directed every 14 (fourteen) days. SATURDAYS.)   . lidocaine-prilocaine (EMLA) cream Apply to port site 1 hour prior to appointment 10/07/2018: NEW MEDICATION (PATIENT HAS NOT CHEMOTHERAPY TO DATE)  . Magnesium 500 MG TABS Take 500 mg by mouth daily.   . mesalamine (APRISO) 0.375 g 24 hr capsule Take 4 capsules by mouth each morning. (Patient taking differently: Take 1.5 g by mouth daily. )   . Naphazoline-Pheniramine (OPCON-A) 0.027-0.315 % SOLN Place 1 drop into both eyes 3 (three) times daily as needed (dry eyes).   09/21/2018: As needed , rare per patient  . PACLitaxel (TAXOL IV) Inject into the vein once a week. Weekly x 12 cycles starting 10/22/2018 10/07/2018: NEW MEDICATION (PATIENT HAS NOT CHEMOTHERAPY TO DATE)   . polyethylene glycol powder (GLYCOLAX/MIRALAX) powder Take 8.5 g by mouth daily as needed. (Patient taking differently: Take 8.5 g by mouth daily as needed (constipation.). )   . PRESCRIPTION MEDICATION Apply 1 application topically 2 (two) times daily as needed (eczema). Ketoconazole-fluticasone 1:1 compounded cream  08/03/2018: Compounded at  [facility]  . prochlorperazine (COMPAZINE) 10 MG tablet Take 1 tablet (10 mg total) by mouth every 6 (six) hours as needed (Nausea or vomiting). 10/07/2018: NEW MEDICATION (PATIENT HAS NOT CHEMOTHERAPY TO DATE)   . traMADol (ULTRAM) 50 MG tablet Take 1 tablet (50 mg total) by mouth every 6 (six) hours as needed.   . Trastuzumab (HERCEPTIN IV) Inject into the vein every 21 ( twenty-one) days. Every 3 weeks x 1 year starting 10/22/2018 10/07/2018: NEW THERAPY PATIENT HAS NOT STARTED YET   Facility-Administered Encounter Medications as of 10/22/2018  Medication  . [COMPLETED] 0.9 %  sodium chloride infusion  . [COMPLETED] acetaminophen (TYLENOL) tablet 650 mg  . dexamethasone (DECADRON) 20 mg in sodium chloride 0.9 % 50 mL IVPB  . [COMPLETED] diphenhydrAMINE (BENADRYL) injection 50 mg  . famotidine (PEPCID) IVPB 20 mg premix  . heparin lock flush 100 unit/mL  . PACLitaxel (TAXOL) 72 mg in sodium chloride 0.9 % 150 mL chemo infusion (</= 10m/m2)  . trastuzumab (HERCEPTIN) 300 mg in sodium chloride 0.9 % 250 mL chemo infusion    ALLERGIES:  Allergies  Allergen Reactions  . Mercaptopurine Other (See Comments)    Dropped WBC  . Sulfa Antibiotics Other (See Comments)    Extreme Weakness     PHYSICAL EXAM:  ECOG Performance status: 1  Vitals:   10/22/18 0926  BP: (!) 151/77  Pulse: 82  Resp: 18  Temp: 98.7 F (37.1 C)  SpO2: 99%   Filed Weights    10/22/18 0926  Weight: 175 lb (79.4 kg)    Physical Exam Constitutional:      Appearance: Normal appearance. She is normal weight.  Musculoskeletal: Normal range of motion.  Skin:    General: Skin is warm and dry.  Neurological:     Mental Status: She is alert and oriented to person, place, and time. Mental status is at baseline.  Psychiatric:        Mood and Affect: Mood normal.        Behavior: Behavior normal.        Thought Content: Thought content normal.        Judgment: Judgment normal.   Chest: Bilateral clear to auscultation. CVS: S1-S2 regular rate and rhythm.   LABORATORY DATA:  I have reviewed the labs as listed.  CBC    Component Value Date/Time   WBC 5.7 10/22/2018 0846   RBC 4.26 10/22/2018 0846   HGB 12.7 10/22/2018 0846   HGB 14.4 03/10/2017 1254   HCT 40.5 10/22/2018 0846   HCT 41.5 03/10/2017 1254   PLT 209 10/22/2018 0846   PLT 249 03/10/2017 1254   MCV 95.1 10/22/2018 0846   MCV 91 03/10/2017 1254   MCH 29.8 10/22/2018 0846   MCHC 31.4 10/22/2018 0846   RDW 12.8 10/22/2018 0846   RDW 13.3 03/10/2017 1254   LYMPHSABS 2.0 10/22/2018 0846   LYMPHSABS 2.7 03/10/2017 1254   MONOABS 0.6 10/22/2018 0846   EOSABS 0.2 10/22/2018 0846   EOSABS 0.4 03/10/2017 1254   BASOSABS 0.0 10/22/2018 0846   BASOSABS 0.1 03/10/2017 1254   CMP Latest Ref Rng & Units 10/22/2018 09/21/2018 08/20/2018  Glucose 70 - 99 mg/dL 102(H) 104(H) 167(H)  BUN 8 - 23 mg/dL _0 Creatinine 0.44 - 1.00 mg/dL 0.94 1.06(H) 1.05(H)  Sodium 135 - 145 mmol/L 139 138 137  Potassium 3.5 - 5.1 mmol/L 5.4(H) 4.7 4.4  Chloride 98 - 111 mmol/L 107 101 106  CO2 22 - 32 mmol/L  _0 Calcium 8.9 - 10.3 mg/dL 8.5(L) 9.1 8.2(L)  Total Protein 6.5 - 8.1 g/dL 7.6 7.6 -  Total Bilirubin 0.3 - 1.2 mg/dL 0.6 0.4 -  Alkaline Phos 38 - 126 U/L 36(L) 28(L) -  AST 15 - 41 U/L 19 18 -  ALT 0 - 44 U/L 13 17 -       DIAGNOSTIC IMAGING:  I have independently reviewed the scans and discussed  with the patient.      ASSESSMENT & PLAN:   Breast cancer of upper-inner quadrant of left female breast ([place]) 1.  Stage I (PT1CNX) HER-2 positive left breast cancer: - Abnormal mamm[facility] at [facility], followed by ultrasound-guided biopsy on 06/23/2018.  IDC, HER-2 positive, ER/PR positive, Ki 67 2%. - Stereotactic biopsy in [place] on 06/29/2018, of the left breast irregular mass within the posterior upper, slightly inner quadrant.  A coil-shaped clip was placed. 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  Ki 67 was 10%. - Patient had a history of left breast cancer in 1998, status post lumpectomy, followed by chemotherapy and radiation, 1 year of tamoxifen which was switched to anastrozole for subsequent 4 years due to intolerance. - She had a history of right breast cancer in 2009, status post mastectomy.  She did not receive any adjuvant chemotherapy or radiation. - She was started on anastrozole on 07/21/2018, she took it for 2 weeks and felt very emotional.  She stopped taking it. -Left mastectomy on 08/19/2018, 1.1 cm IDC, grade 1, margins negative.  No lymph nodes were identified.,  ER was 100% positive, PR 7% positive and HER-2 3+ positive.  - I have called and talked to Dr. Claudette Laws (pathologist).  He clarified that there was an initial biopsy done on 06/23/2018, at [facility].  This report is not uploaded on epic.  The initial biopsy was reportedly positive for HER-2. -The second biopsy on 06/29/2018 was negative for HER-2.  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 - The second focus was not clearly identified on the mastectomy specimen. - I have recommended adjuvant chemotherapy with weekly paclitaxel for 12 cycles and Herceptin for 1 year.  - I reviewed her 2D echocardiogram dated 10/07/2018 which shows EF of 60 to 65%. - I reviewed her blood work today.  Potassium is slightly  high at 5.4.  We will give her insulin and D50.  She is not on any potassium supplements. -We reviewed the side effects of paclitaxel and Herceptin in detail.  She was told to use Imodium and Compazine as needed. -I will start her on 50% dose of paclitaxel and increase it as she tolerates well. -I will see her back in 1 week for follow-up and her second treatment.  2.  Osteoporosis: - She had DEXA scan done at [facility].  She receives Prolia every 6 months at Dr. Carmie End office.   -Calcium today was normal.  She will continue calcium and vitamin D twice daily.   3.  Recurrent DVT: - Right leg femoral and popliteal DVT, diagnosed on 03/08/2018.  She is currently on Eliquis. - She reported having leg swellings and decreased mobility prior to most recent DVT. - She had a history of DVT in 2000's, was treated on Coumadin until 5 years ago.  This was stopped secondary to a bleed complicated by her ulcerative colitis. - She was on a one-week bus trip to [place] when she first developed her blood clot.  4.  Ulcerative colitis: -She is currently on mesalamine and Humira.  This is fairly well controlled. -We will watch her closely as Herceptin can cause diarrhea.   Total time spent is 25 minutes more than 50% of the time spent face-to-face discussing pathology report, treatment recommendations, chemotherapy side effects and coordination of care.    Orders placed this encounter:  Orders Placed This Encounter  Procedures  . CBC with Differential/Platelet  . Comprehensive metabolic panel  . Magnesium  . TREATMENT CONDITIONS      Derek Jack, [place] [phone]

## 2018-10-22 NOTE — Patient Instructions (Signed)
[facility] at [facility]  Discharge Instructions:   _______________________________________________________________  Thank you for choosing [facility] at [facility] to provide your oncology and hematology care.  To afford each patient quality time with our providers, please arrive at least 15 minutes before your scheduled appointment.  You need to re-schedule your appointment if you arrive 10 or more minutes late.  We strive to give you quality time with our providers, and arriving late affects you and other patients whose appointments are after yours.  Also, if you no show three or more times for appointments you may be dismissed from the clinic.  Again, thank you for choosing  Can[facility] Center at [facility]. Our hope is that these requests will allow you access to exceptional care and in a timely manner. _______________________________________________________________  If you have questions after your visit, please contact our office at [phone] between the hours of 8:30 a.m. and 5:00 p.m. Voicemails left after 4:30 p.m. will not be returned until the following business day. _______________________________________________________________  For prescription refill requests, have your pharmacy contact our office. _______________________________________________________________  Recommendations made by the consultant and any test results will be sent to your referring physician. _______________________________________________________________ 

## 2018-10-26 ENCOUNTER — Ambulatory Visit (HOSPITAL_COMMUNITY): Payer: Medicare Other | Admitting: Hematology

## 2018-10-27 ENCOUNTER — Ambulatory Visit (HOSPITAL_COMMUNITY): Payer: Medicare Other

## 2018-10-27 ENCOUNTER — Other Ambulatory Visit (HOSPITAL_COMMUNITY): Payer: Medicare Other

## 2018-10-29 ENCOUNTER — Other Ambulatory Visit: Payer: Self-pay

## 2018-10-29 ENCOUNTER — Inpatient Hospital Stay (HOSPITAL_BASED_OUTPATIENT_CLINIC_OR_DEPARTMENT_OTHER): Payer: Medicare Other | Admitting: Hematology

## 2018-10-29 ENCOUNTER — Encounter (HOSPITAL_COMMUNITY): Payer: Self-pay | Admitting: Hematology

## 2018-10-29 ENCOUNTER — Other Ambulatory Visit (HOSPITAL_COMMUNITY): Payer: Medicare Other

## 2018-10-29 ENCOUNTER — Inpatient Hospital Stay (HOSPITAL_COMMUNITY): Payer: Medicare Other

## 2018-10-29 VITALS — BP 141/88 | HR 65 | Temp 98.1°F | Resp 16 | Wt 175.0 lb

## 2018-10-29 VITALS — BP 143/68 | HR 66 | Temp 98.2°F | Resp 16 | Wt 175.0 lb

## 2018-10-29 DIAGNOSIS — Z170 Estrogen receptor positive status [ER+]: Principal | ICD-10-CM

## 2018-10-29 DIAGNOSIS — Z5112 Encounter for antineoplastic immunotherapy: Secondary | ICD-10-CM | POA: Diagnosis not present

## 2018-10-29 DIAGNOSIS — C50212 Malignant neoplasm of upper-inner quadrant of left female breast: Secondary | ICD-10-CM | POA: Diagnosis not present

## 2018-10-29 DIAGNOSIS — I82411 Acute embolism and thrombosis of right femoral vein: Secondary | ICD-10-CM | POA: Diagnosis not present

## 2018-10-29 DIAGNOSIS — Z853 Personal history of malignant neoplasm of breast: Secondary | ICD-10-CM

## 2018-10-29 DIAGNOSIS — K5790 Diverticulosis of intestine, part unspecified, without perforation or abscess without bleeding: Secondary | ICD-10-CM

## 2018-10-29 DIAGNOSIS — I82431 Acute embolism and thrombosis of right popliteal vein: Secondary | ICD-10-CM | POA: Diagnosis not present

## 2018-10-29 DIAGNOSIS — M810 Age-related osteoporosis without current pathological fracture: Secondary | ICD-10-CM

## 2018-10-29 DIAGNOSIS — Z87891 Personal history of nicotine dependence: Secondary | ICD-10-CM

## 2018-10-29 LAB — CBC WITH DIFFERENTIAL/PLATELET
Abs Immature Granulocytes: 0.04 10*3/uL (ref 0.00–0.07)
Basophils Absolute: 0 10*3/uL (ref 0.0–0.1)
Basophils Relative: 0 %
Eosinophils Absolute: 0.2 10*3/uL (ref 0.0–0.5)
Eosinophils Relative: 5 %
HCT: 36.7 % (ref 36.0–46.0)
Hemoglobin: 11.7 g/dL — ABNORMAL LOW (ref 12.0–15.0)
Immature Granulocytes: 1 %
Lymphocytes Relative: 38 %
Lymphs Abs: 1.7 10*3/uL (ref 0.7–4.0)
MCH: 30.5 pg (ref 26.0–34.0)
MCHC: 31.9 g/dL (ref 30.0–36.0)
MCV: 95.6 fL (ref 80.0–100.0)
Monocytes Absolute: 0.4 10*3/uL (ref 0.1–1.0)
Monocytes Relative: 9 %
Neutro Abs: 2.2 10*3/uL (ref 1.7–7.7)
Neutrophils Relative %: 47 %
Platelets: 200 10*3/uL (ref 150–400)
RBC: 3.84 MIL/uL — ABNORMAL LOW (ref 3.87–5.11)
RDW: 12.6 % (ref 11.5–15.5)
WBC: 4.6 10*3/uL (ref 4.0–10.5)
nRBC: 0 % (ref 0.0–0.2)

## 2018-10-29 LAB — COMPREHENSIVE METABOLIC PANEL
ALT: 19 U/L (ref 0–44)
AST: 17 U/L (ref 15–41)
Albumin: 3.6 g/dL (ref 3.5–5.0)
Alkaline Phosphatase: 30 U/L — ABNORMAL LOW (ref 38–126)
Anion gap: 6 (ref 5–15)
BUN: 21 mg/dL (ref 8–23)
CO2: 27 mmol/L (ref 22–32)
Calcium: 9 mg/dL (ref 8.9–10.3)
Chloride: 106 mmol/L (ref 98–111)
Creatinine, Ser: 0.92 mg/dL (ref 0.44–1.00)
GFR calc Af Amer: >60 mL/min (ref >60)
GFR calc non Af Amer: 59 mL/min — ABNORMAL LOW (ref >60)
Glucose, Bld: 103 mg/dL — ABNORMAL HIGH (ref 70–99)
Potassium: 4 mmol/L (ref 3.5–5.1)
Sodium: 139 mmol/L (ref 135–145)
Total Bilirubin: 0.4 mg/dL (ref 0.3–1.2)
Total Protein: 6.7 g/dL (ref 6.5–8.1)

## 2018-10-29 LAB — MAGNESIUM: Magnesium: 2.3 mg/dL (ref 1.7–2.4)

## 2018-10-29 MED ORDER — HEPARIN SOD (PORK) LOCK FLUSH 100 UNIT/ML IV SOLN
500.0000 [IU] | Freq: Once | INTRAVENOUS | Status: AC | PRN
  Administered 2018-10-29: 500 [IU]
  Filled 2018-10-29: qty 5

## 2018-10-29 MED ORDER — SODIUM CHLORIDE 0.9 % IV SOLN
40.0000 mg/m2 | Freq: Once | INTRAVENOUS | Status: AC
  Administered 2018-10-29: 72 mg via INTRAVENOUS
  Filled 2018-10-29: qty 12

## 2018-10-29 MED ORDER — SODIUM CHLORIDE 0.9 % IV SOLN
20.0000 mg | Freq: Once | INTRAVENOUS | Status: AC
  Administered 2018-10-29: 20 mg via INTRAVENOUS
  Filled 2018-10-29: qty 2

## 2018-10-29 MED ORDER — ACETAMINOPHEN 325 MG PO TABS
650.0000 mg | ORAL_TABLET | Freq: Once | ORAL | Status: AC
  Administered 2018-10-29: 650 mg via ORAL
  Filled 2018-10-29: qty 2

## 2018-10-29 MED ORDER — DIPHENHYDRAMINE HCL 50 MG/ML IJ SOLN
50.0000 mg | Freq: Once | INTRAMUSCULAR | Status: AC
  Administered 2018-10-29: 50 mg via INTRAVENOUS
  Filled 2018-10-29: qty 1

## 2018-10-29 MED ORDER — FAMOTIDINE IN NACL 20-0.9 MG/50ML-% IV SOLN
20.0000 mg | Freq: Once | INTRAVENOUS | Status: AC
  Administered 2018-10-29: 20 mg via INTRAVENOUS
  Filled 2018-10-29: qty 50

## 2018-10-29 MED ORDER — TRASTUZUMAB CHEMO 150 MG IV SOLR
150.0000 mg | Freq: Once | INTRAVENOUS | Status: AC
  Administered 2018-10-29: 150 mg via INTRAVENOUS
  Filled 2018-10-29: qty 7.14

## 2018-10-29 MED ORDER — SODIUM CHLORIDE 0.9% FLUSH
10.0000 mL | INTRAVENOUS | Status: DC | PRN
  Administered 2018-10-29: 10 mL
  Filled 2018-10-29: qty 10

## 2018-10-29 MED ORDER — SODIUM CHLORIDE 0.9 % IV SOLN
Freq: Once | INTRAVENOUS | Status: AC
  Administered 2018-10-29: 10:00:00 via INTRAVENOUS

## 2018-10-29 NOTE — Progress Notes (Signed)
Labs reviewed with Dr. Delton Coombes today at office visit. No issues at this time. Proceed today per MD.   Treatment given per orders. Patient tolerated it well without problems. Vitals stable and discharged home from clinic ambulatory. Follow up as scheduled.

## 2018-10-29 NOTE — Patient Instructions (Addendum)
[place] at [facility] Discharge Instructions  You were seen today by Dr. Delton Coombes. He went over your recent lab results. He will see you back in 1 weel for labs, and treatment. He will see you back in 2 weeks for labs, follow up and treatment    Thank you for choosing [place] at [facility] to provide your oncology and hematology care.  To afford each patient quality time with our provider, please arrive at least 15 minutes before your scheduled appointment time.   If you have a lab appointment with the [facility] please come in thru the  Main Entrance and check in at the main information desk  You need to re-schedule your appointment should you arrive 10 or more minutes late.  We strive to give you quality time with our providers, and arriving late affects you and other patients whose appointments are after yours.  Also, if you no show three or more times for appointments you may be dismissed from the clinic at the providers discretion.     Again, thank you for choosing [facility]  Our hope is that these requests will decrease the amount of time that you wait before being seen by our physicians.       _____________________________________________________________  Should you have questions after your visit to [facility], please contact our office at [phone] between the hours of 8:00 a.m. and 4:30 p.m.  Voicemails left after 4:00 p.m. will not be returned until the following business day.  For prescription refill requests, have your pharmacy contact our office and allow 72 hours.    Cancer Center Support Programs:   > Cancer Support Group  2nd Tuesday of the month 1pm-2pm, Journey Room

## 2018-10-29 NOTE — Progress Notes (Signed)
[place], [place] [address]   CLINIC:  Medical Oncology/Hematology  PCP:  Octavio Graves, DO [address] [place] [address] [phone]   REASON FOR VISIT:  Follow-up for HER-2 positive left breast cancer.  CURRENT THERAPY: Paclitaxel and Herceptin weekly.  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 breast ([place])   07/21/2018 Initial Diagnosis    Breast cancer of upper-inner quadrant of left female breast ([place])    10/22/2018 -  Chemotherapy    The patient had trastuzumab (HERCEPTIN) 300 mg in sodium chloride 0.9 % 250 mL chemo infusion, 315 mg, Intravenous,  Once, 1 of 16 cycles Administration: 300 mg (10/22/2018) PACLitaxel (TAXOL) 72 mg in sodium chloride 0.9 % 150 mL chemo infusion (</= 68m/m2), 40 mg/m2 = 72 mg (50 % of original dose 80 mg/m2), Intravenous,  Once, 1 of 3 cycles Dose modification: 40 mg/m2 (50 % of original dose 80 mg/m2, Cycle 1, Reason: Provider Judgment) Administration: 72 mg (10/22/2018)  for chemotherapy treatment.       CANCER STAGING: Cancer Staging No matching staging information was found for the patient.   INTERVAL HISTORY:  Ms. [id number]. female returns for routine follow-up and consideration for next cycle of chemotherapy. She is here today by herself. She states that she had diarrhea and took imodium and then ended up with constipation, but it's better now. Denies any nausea, vomiting, or diarrhea. Denies any new pains. Had not noticed any recent bleeding such as epistaxis, hematuria or hematochezia. Denies recent chest pain on exertion, shortness of breath on minimal exertion, pre-syncopal episodes, or palpitations. Denies any numbness or tingling in hands or feet. Denies any recent fevers, infections, or recent hospitalizations. Patient reports appetite at 100% and energy level at 100%.   Overall, she feels ready for next cycle of chemo today.      REVIEW OF SYSTEMS:  Review of Systems  Gastrointestinal: Positive for diarrhea.  All other systems reviewed and are negative.    PAST MEDICAL/SURGICAL HISTORY:  Past Medical History:  Diagnosis Date  . Adult idiopathic generalized osteoporosis   . Arthritis    "minor" (08/18/2018)  . Breast cancer, left breast ([facility] [place]   lumpectomy  . Breast cancer, right breast ([facility] [place] 2009   lumpectomy; mastectomy  . DVT (deep venous thrombosis) ([facility] "early 2000's"   RLE  . DVT (deep venous thrombosis) ([facility]   . Hemorrhoids, external   . History of blood transfusion    "related to ulcerative colitis"  . Lower abdominal pain   . Occult blood in stools   . Peripheral edema   . Seizures ([facility]    one very slight seizure with the stroke; somewhere between 2005-2009  . Stroke ([facility] 2005-2009   "very light;" ; denies residual on 08/19/2018  . UC (ulcerative colitis) ([place] 10355  Past Surgical History:  Procedure Laterality Date  . APPENDECTOMY    . bone density  12/11/10  . BREAST BIOPSY Left 1998; 2019 X 2  . BREAST BIOPSY Right 2009  . BREAST LUMPECTOMY Left 1998  . CATARACT EXTRACTION W/ INTRAOCULAR LENS  IMPLANT, BILATERAL Bilateral   . COLONOSCOPY  02/21/2011  . COLONOSCOPY  10/06/2008  . COLONOSCOPY  12/27/01  . COLONOSCOPY  05/09/05  . COLONOSCOPY N/A 07/02/2016   Procedure: COLONOSCOPY;  Surgeon: [name] MD;  Location: [facility] SUITE;  Service: Endoscopy;  Laterality: N/A;  1055  .  [place]   "I was having rectal bleeding"  . FRACTURE SURGERY    . MASTECTOMY Right 2009  . MASTECTOMY COMPLETE / SIMPLE Left 08/19/2018  . PORTACATH PLACEMENT Right 10/15/2018   Procedure: INSERTION PORT-A-CATH RIGHT SUBCLAVIN;  Surgeon: [name], MD;  Location: [facility] ORS;  Service: General;  Laterality: Right;  pt knows to arrive at 7:30  . SIMPLE MASTECTOMY WITH AXILLARY SENTINEL NODE BIOPSY Left 08/19/2018   Procedure: LEFT SIMPLE MASTECTOMY;  Surgeon:  [name], MD;  Location: [facility];  Service: General;  Laterality: Left;  . TONSILLECTOMY    . VENA CAVA FILTER PLACEMENT Right   . WRIST FRACTURE SURGERY Right      SOCIAL HISTORY:  Social History   Socioeconomic History  . Marital status: Widowed    Spouse name: Not on file  . Number of children: 0  . Years of education: Not on file  . Highest education level: Not on file  Occupational History  . Occupation: Optometrist: [employer]  Social Needs  . Financial resource strain: Not hard at all  . Food insecurity:    Worry: Never true    Inability: Never true  . Transportation needs:    Medical: No    Non-medical: No  Tobacco Use  . Smoking status: Former Smoker    Packs/day: 2.00    Years: 40.00    Pack years: 80.00    Types: Cigarettes    Last attempt to quit: 05/05/2001    Years since quitting: 17.4  . Smokeless tobacco: Never Used  Substance and Sexual Activity  . Alcohol use: Yes    Comment: 08/18/2018 "couple drinks/year"  . Drug use: Never  . Sexual activity: Not Currently  Lifestyle  . Physical activity:    Days per week: 0 days    Minutes per session: 0 min  . Stress: Not at all  Relationships  . Social connections:    Talks on phone: More than three times a week    Gets together: More than three times a week    Attends religious service: More than 4 times per year    Active member of club or organization: Yes    Attends meetings of clubs or organizations: More than 4 times per year    Relationship status: Widowed  . Intimate partner violence:    Fear of current or ex partner: No    Emotionally abused: No    Physically abused: No    Forced sexual activity: No  Other Topics Concern  . Not on file  Social History Narrative  . Not on file    FAMILY HISTORY:  Family History  Problem Relation Age of Onset  . Prostate cancer Father   . Colon cancer Neg Hx     CURRENT MEDICATIONS:  Outpatient Encounter  Medications as of 10/29/2018  Medication Sig Note  . acetaminophen (TYLENOL) 500 MG tablet Take 500 mg by mouth every 6 (six) hours as needed for moderate pain or headache.    Marland Kitchen apixaban (ELIQUIS) 5 MG TABS tablet Take 2 tablets (10 mg total) by mouth 2 (two) times daily. Then 1 tablet (5 mg) two times daily starting 03/16/18 (Patient taking differently: Take 2.5 mg by mouth 2 (two) times daily. )   . Ascorbic Acid (VITAMIN C) 1000 MG tablet Take 1,000 mg by mouth daily.   . Calcium Carb-Cholecalciferol (CALCIUM 500 + D3) 500-600 MG-UNIT TABS Take 1 tablet by mouth daily.    Marland Kitchen  denosumab (PROLIA) 60 MG/ML SOLN injection Inject 60 mg into the skin every 6 (six) months. Administer in upper arm, thigh, or abdomen 10/07/2018: LAST DOSE:09/2018  . HUMIRA PEN 40 MG/0.8ML PNKT INJECT 40 MG SUBQ EVERY 14 DAYS. (Patient taking differently: Inject 40 mg as directed every 14 (fourteen) days. SATURDAYS.)   . lidocaine-prilocaine (EMLA) cream Apply to port site 1 hour prior to appointment 10/07/2018: NEW MEDICATION (PATIENT HAS NOT CHEMOTHERAPY TO DATE)  . Magnesium 500 MG TABS Take 500 mg by mouth daily.   . mesalamine (APRISO) 0.375 g 24 hr capsule Take 4 capsules by mouth each morning. (Patient taking differently: Take 1.5 g by mouth daily. )   . Naphazoline-Pheniramine (OPCON-A) 0.027-0.315 % SOLN Place 1 drop into both eyes 3 (three) times daily as needed (dry eyes).  09/21/2018: As needed , rare per patient  . PACLitaxel (TAXOL IV) Inject into the vein once a week. Weekly x 12 cycles starting 10/22/2018 10/07/2018: NEW MEDICATION (PATIENT HAS NOT CHEMOTHERAPY TO DATE)   . polyethylene glycol powder (GLYCOLAX/MIRALAX) powder Take 8.5 g by mouth daily as needed. (Patient taking differently: Take 8.5 g by mouth daily as needed (constipation.). )   . PRESCRIPTION MEDICATION Apply 1 application topically 2 (two) times daily as needed (eczema). Ketoconazole-fluticasone 1:1 compounded cream  08/03/2018: Compounded at [facility]   . prochlorperazine (COMPAZINE) 10 MG tablet Take 1 tablet (10 mg total) by mouth every 6 (six) hours as needed (Nausea or vomiting). 10/07/2018: NEW MEDICATION (PATIENT HAS NOT CHEMOTHERAPY TO DATE)   . traMADol (ULTRAM) 50 MG tablet Take 1 tablet (50 mg total) by mouth every 6 (six) hours as needed.   . Trastuzumab (HERCEPTIN IV) Inject into the vein every 21 ( twenty-one) days. Every 3 weeks x 1 year starting 10/22/2018 10/07/2018: NEW THERAPY PATIENT HAS NOT STARTED YET   No facility-administered encounter medications on file as of 10/29/2018.     ALLERGIES:  Allergies  Allergen Reactions  . Mercaptopurine Other (See Comments)    Dropped WBC  . Sulfa Antibiotics Other (See Comments)    Extreme Weakness     PHYSICAL EXAM:  ECOG Performance status: 1  Vitals:   10/29/18 0845  BP: (!) 141/88  Pulse: 65  Resp: 16  Temp: 98.1 F (36.7 C)  SpO2: 97%   Filed Weights   10/29/18 0845  Weight: 175 lb (79.4 kg)    Physical Exam Constitutional:      Appearance: Normal appearance.  Cardiovascular:     Rate and Rhythm: Normal rate and regular rhythm.     Heart sounds: Normal heart sounds.  Pulmonary:     Effort: Pulmonary effort is normal.     Breath sounds: Normal breath sounds.  Abdominal:     General: Bowel sounds are normal. There is no distension.     Palpations: Abdomen is soft.  Skin:    General: Skin is warm.  Neurological:     General: No focal deficit present.     Mental Status: She is alert and oriented to person, place, and time.  Psychiatric:        Mood and Affect: Mood normal.        Behavior: Behavior normal.      LABORATORY DATA:  I have reviewed the labs as listed.  CBC    Component Value Date/Time   WBC 4.6 10/29/2018 0823   RBC 3.84 (L) 10/29/2018 0823   HGB 11.7 (L) 10/29/2018 0823   HGB 14.4 03/10/2017 1254   HCT  36.7 10/29/2018 0823   HCT 41.5 03/10/2017 1254   PLT 200 10/29/2018 0823   PLT 249 03/10/2017 1254   MCV 95.6 10/29/2018 0823    MCV 91 03/10/2017 1254   MCH 30.5 10/29/2018 0823   MCHC 31.9 10/29/2018 0823   RDW 12.6 10/29/2018 0823   RDW 13.3 03/10/2017 1254   LYMPHSABS 1.7 10/29/2018 0823   LYMPHSABS 2.7 03/10/2017 1254   MONOABS 0.4 10/29/2018 0823   EOSABS 0.2 10/29/2018 0823   EOSABS 0.4 03/10/2017 1254   BASOSABS 0.0 10/29/2018 0823   BASOSABS 0.1 03/10/2017 1254   CMP Latest Ref Rng & Units 10/29/2018 10/22/2018 09/21/2018  Glucose 70 - 99 mg/dL 103(H) 102(H) 104(H)  BUN 8 - 23 mg/dL _0 Creatinine 0.44 - 1.00 mg/dL 0.92 0.94 1.06(H)  Sodium 135 - 145 mmol/L 139 139 138  Potassium 3.5 - 5.1 mmol/L 4.0 5.4(H) 4.7  Chloride 98 - 111 mmol/L 106 107 101  CO2 22 - 32 mmol/L _1 Calcium 8.9 - 10.3 mg/dL 9.0 8.5(L) 9.1  Total Protein 6.5 - 8.1 g/dL 6.7 7.6 7.6  Total Bilirubin 0.3 - 1.2 mg/dL 0.4 0.6 0.4  Alkaline Phos 38 - 126 U/L 30(L) 36(L) 28(L)  AST 15 - 41 U/L _2 ALT 0 - 44 U/L _3 DIAGNOSTIC IMAGING:  I have independently reviewed the scans and discussed with the patient.   I have reviewed Venita Lick LPN's note and agree with the documentation.  I personally performed a face-to-face visit, made revisions and my assessment and plan is as follows.    ASSESSMENT & PLAN:   Breast cancer of upper-inner quadrant of left female breast ([place]) 1.  Stage I (PT1CNX) HER-2 positive left breast cancer: - Abnormal mammogram at [facility], followed by ultrasound-guided biopsy on 06/23/2018.  IDC, HER-2 positive, ER/PR positive, Ki 67 2%. - Stereotactic biopsy in [place] on 06/29/2018, of the left breast irregular mass within the posterior upper, slightly inner quadrant.  A coil-shaped clip was placed. 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  Ki 67 was 10%. - Patient had a history of left breast cancer in 1998, status post lumpectomy, followed by chemotherapy and radiation, 1 year of tamoxifen which was switched to  anastrozole for subsequent 4 years due to intolerance. - She had a history of right breast cancer in 2009, status post mastectomy.  She did not receive any adjuvant chemotherapy or radiation. - She was started on anastrozole on 07/21/2018, she took it for 2 weeks and felt very emotional.  She stopped taking it. -Left mastectomy on 08/19/2018, 1.1 cm IDC, grade 1, margins negative.  No lymph nodes were identified.,  ER was 100% positive, PR 7% positive and HER-2 3+ positive.  - I have called and talked to Dr. Claudette Laws (pathologist).  He clarified that there was an initial biopsy done on 06/23/2018, at [facility].  This report is not uploaded on epic.  The initial biopsy was reportedly positive for HER-2. -The second biopsy on 06/29/2018 was negative for HER-2.  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 - The second focus was not clearly identified on the mastectomy specimen. - Adjuvant chemotherapy with weekly paclitaxel for 12 cycles and Herceptin for 1 year was recommended. -2D echocardiogram on 10/07/2018 shows EF of 60 to 65%. - Week 1 of Herceptin (  4 mg/kg) and paclitaxel 40 mg/m started on 10/22/2018. -She tolerated her first cycle very well.  She had one episode of diarrhea and was controlled with Imodium.  Her energy levels remain normal.  Denied any nausea or vomiting.  No tingling or numbness next 20s was reported. -He may proceed with cycle 2 today.  We have reviewed her blood work. -I will see her back in 2 weeks for follow-up.  I plan to increase her Taxol dose if she tolerates it well.  2.  Osteoporosis: - She had DEXA scan done at [facility].  She receives Prolia every 6 months at Dr. Carmie End office.   -Calcium today was normal.  She will continue calcium and vitamin D twice daily.   3.  Recurrent DVT: - Right leg femoral and popliteal DVT, diagnosed on 03/08/2018.  She is currently on Eliquis. - She reported having leg swellings  and decreased mobility prior to most recent DVT. - She had a history of DVT in 2000's, was treated on Coumadin until 5 years ago.  This was stopped secondary to a bleed complicated by her ulcerative colitis. - She was on a one-week bus trip to [place] when she first developed her blood clot.  4.  Ulcerative colitis: -She is currently on mesalamine and Humira.  This is fairly well controlled. -We will closely watch her as Herceptin can worsen her diarrhea.       Orders placed this encounter:  Orders Placed This Encounter  Procedures  . CBC with Differential/Platelet  . Comprehensive metabolic panel  . Magnesium      Derek Jack, MD [place] [phone]

## 2018-10-29 NOTE — Assessment & Plan Note (Signed)
1.  Stage I (PT1CNX) HER-2 positive left breast cancer: - Abnormal mammogram at [facility], followed by ultrasound-guided biopsy on 06/23/2018.  IDC, HER-2 positive, ER/PR positive, Ki 67 2%. - Stereotactic biopsy in [place] on 06/29/2018, of the left breast irregular mass within the posterior upper, slightly inner quadrant.  A coil-shaped clip was placed. 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  Ki 67 was 10%. - Patient had a history of left breast cancer in 1998, status post lumpectomy, followed by chemotherapy and radiation, 1 year of tamoxifen which was switched to anastrozole for subsequent 4 years due to intolerance. - She had a history of right breast cancer in 2009, status post mastectomy.  She did not receive any adjuvant chemotherapy or radiation. - She was started on anastrozole on 07/21/2018, she took it for 2 weeks and felt very emotional.  She stopped taking it. -Left mastectomy on 08/19/2018, 1.1 cm IDC, grade 1, margins negative.  No lymph nodes were identified.,  ER was 100% positive, PR 7% positive and HER-2 3+ positive.  - I have called and talked to Dr. Claudette Laws (pathologist).  He clarified that there was an initial biopsy done on 06/23/2018, at [facility].  This report is not uploaded on epic.  The initial biopsy was reportedly positive for HER-2. -The second biopsy on 06/29/2018 was negative for HER-2.  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 - The second focus was not clearly identified on the mastectomy specimen. - Adjuvant chemotherapy with weekly paclitaxel for 12 cycles and Herceptin for 1 year was recommended. -2D echocardiogram on 10/07/2018 shows EF of 60 to 65%. - Week 1 of Herceptin (4 mg/kg) and paclitaxel 40 mg/m started on 10/22/2018. -She tolerated her first cycle very well.  She had one episode of diarrhea and was controlled with Imodium.  Her energy levels  remain normal.  Denied any nausea or vomiting.  No tingling or numbness next 20s was reported. -He may proceed with cycle 2 today.  We have reviewed her blood work. -I will see her back in 2 weeks for follow-up.  I plan to increase her Taxol dose if she tolerates it well.  2.  Osteoporosis: - She had DEXA scan done at [facility].  She receives Prolia every 6 months at Dr. Carmie End office.   -Calcium today was normal.  She will continue calcium and vitamin D twice daily.   3.  Recurrent DVT: - Right leg femoral and popliteal DVT, diagnosed on 03/08/2018.  She is currently on Eliquis. - She reported having leg swellings and decreased mobility prior to most recent DVT. - She had a history of DVT in 2000's, was treated on Coumadin until 5 years ago.  This was stopped secondary to a bleed complicated by her ulcerative colitis. - She was on a one-week bus trip to [place] when she first developed her blood clot.  4.  Ulcerative colitis: -She is currently on mesalamine and Humira.  This is fairly well controlled. -We will closely watch her as Herceptin can worsen her diarrhea.

## 2018-11-03 ENCOUNTER — Other Ambulatory Visit (HOSPITAL_COMMUNITY): Payer: Medicare Other

## 2018-11-03 ENCOUNTER — Ambulatory Visit (HOSPITAL_COMMUNITY): Payer: Medicare Other

## 2018-11-03 ENCOUNTER — Ambulatory Visit (HOSPITAL_COMMUNITY): Payer: Medicare Other | Admitting: Hematology

## 2018-11-05 ENCOUNTER — Other Ambulatory Visit: Payer: Self-pay

## 2018-11-05 ENCOUNTER — Inpatient Hospital Stay (HOSPITAL_COMMUNITY): Payer: Medicare Other

## 2018-11-05 ENCOUNTER — Other Ambulatory Visit (HOSPITAL_COMMUNITY): Payer: Medicare Other

## 2018-11-05 VITALS — BP 143/71 | HR 70 | Temp 98.1°F | Resp 18 | Wt 175.2 lb

## 2018-11-05 DIAGNOSIS — Z170 Estrogen receptor positive status [ER+]: Principal | ICD-10-CM

## 2018-11-05 DIAGNOSIS — C50212 Malignant neoplasm of upper-inner quadrant of left female breast: Secondary | ICD-10-CM

## 2018-11-05 DIAGNOSIS — Z5112 Encounter for antineoplastic immunotherapy: Secondary | ICD-10-CM | POA: Diagnosis not present

## 2018-11-05 LAB — MAGNESIUM: Magnesium: 2.3 mg/dL (ref 1.7–2.4)

## 2018-11-05 LAB — COMPREHENSIVE METABOLIC PANEL
ALT: 19 U/L (ref 0–44)
AST: 16 U/L (ref 15–41)
Albumin: 3.7 g/dL (ref 3.5–5.0)
Alkaline Phosphatase: 30 U/L — ABNORMAL LOW (ref 38–126)
Anion gap: 6 (ref 5–15)
BUN: 22 mg/dL (ref 8–23)
CO2: 25 mmol/L (ref 22–32)
Calcium: 8.6 mg/dL — ABNORMAL LOW (ref 8.9–10.3)
Chloride: 107 mmol/L (ref 98–111)
Creatinine, Ser: 0.98 mg/dL (ref 0.44–1.00)
GFR calc Af Amer: >60 mL/min (ref >60)
GFR calc non Af Amer: 54 mL/min — ABNORMAL LOW (ref >60)
Glucose, Bld: 103 mg/dL — ABNORMAL HIGH (ref 70–99)
Potassium: 3.8 mmol/L (ref 3.5–5.1)
Sodium: 138 mmol/L (ref 135–145)
Total Bilirubin: 0.6 mg/dL (ref 0.3–1.2)
Total Protein: 6.9 g/dL (ref 6.5–8.1)

## 2018-11-05 LAB — CBC WITH DIFFERENTIAL/PLATELET
Abs Immature Granulocytes: 0.02 10*3/uL (ref 0.00–0.07)
Basophils Absolute: 0 10*3/uL (ref 0.0–0.1)
Basophils Relative: 1 %
Eosinophils Absolute: 0.2 10*3/uL (ref 0.0–0.5)
Eosinophils Relative: 4 %
HCT: 36.2 % (ref 36.0–46.0)
Hemoglobin: 11.4 g/dL — ABNORMAL LOW (ref 12.0–15.0)
Immature Granulocytes: 1 %
Lymphocytes Relative: 31 %
Lymphs Abs: 1.3 10*3/uL (ref 0.7–4.0)
MCH: 30.2 pg (ref 26.0–34.0)
MCHC: 31.5 g/dL (ref 30.0–36.0)
MCV: 96 fL (ref 80.0–100.0)
Monocytes Absolute: 0.4 10*3/uL (ref 0.1–1.0)
Monocytes Relative: 10 %
Neutro Abs: 2.3 10*3/uL (ref 1.7–7.7)
Neutrophils Relative %: 53 %
Platelets: 205 10*3/uL (ref 150–400)
RBC: 3.77 MIL/uL — ABNORMAL LOW (ref 3.87–5.11)
RDW: 12.9 % (ref 11.5–15.5)
WBC: 4.2 10*3/uL (ref 4.0–10.5)
nRBC: 0 % (ref 0.0–0.2)

## 2018-11-05 MED ORDER — FAMOTIDINE IN NACL 20-0.9 MG/50ML-% IV SOLN
20.0000 mg | Freq: Once | INTRAVENOUS | Status: AC
  Administered 2018-11-05: 20 mg via INTRAVENOUS
  Filled 2018-11-05: qty 50

## 2018-11-05 MED ORDER — SODIUM CHLORIDE 0.9 % IV SOLN
20.0000 mg | Freq: Once | INTRAVENOUS | Status: AC
  Administered 2018-11-05: 20 mg via INTRAVENOUS
  Filled 2018-11-05: qty 2

## 2018-11-05 MED ORDER — ACETAMINOPHEN 325 MG PO TABS
650.0000 mg | ORAL_TABLET | Freq: Once | ORAL | Status: AC
  Administered 2018-11-05: 650 mg via ORAL
  Filled 2018-11-05: qty 2

## 2018-11-05 MED ORDER — DIPHENHYDRAMINE HCL 50 MG/ML IJ SOLN
50.0000 mg | Freq: Once | INTRAMUSCULAR | Status: AC
  Administered 2018-11-05: 50 mg via INTRAVENOUS
  Filled 2018-11-05: qty 1

## 2018-11-05 MED ORDER — HEPARIN SOD (PORK) LOCK FLUSH 100 UNIT/ML IV SOLN
500.0000 [IU] | Freq: Once | INTRAVENOUS | Status: AC | PRN
  Administered 2018-11-05: 500 [IU]
  Filled 2018-11-05: qty 5

## 2018-11-05 MED ORDER — TRASTUZUMAB CHEMO 150 MG IV SOLR
150.0000 mg | Freq: Once | INTRAVENOUS | Status: AC
  Administered 2018-11-05: 150 mg via INTRAVENOUS
  Filled 2018-11-05: qty 7.14

## 2018-11-05 MED ORDER — FAMOTIDINE IN NACL 20-0.9 MG/50ML-% IV SOLN: 20.0000 mg | Freq: Once | INTRAVENOUS | Status: DC

## 2018-11-05 MED ORDER — SODIUM CHLORIDE 0.9 % IV SOLN
Freq: Once | INTRAVENOUS | Status: AC
  Administered 2018-11-05: 09:00:00 via INTRAVENOUS

## 2018-11-05 MED ORDER — SODIUM CHLORIDE 0.9% FLUSH
10.0000 mL | INTRAVENOUS | Status: DC | PRN
  Administered 2018-11-05 (×2): 10 mL
  Filled 2018-11-05 (×2): qty 10

## 2018-11-05 MED ORDER — SODIUM CHLORIDE 0.9 % IV SOLN
40.0000 mg/m2 | Freq: Once | INTRAVENOUS | Status: AC
  Administered 2018-11-05: 72 mg via INTRAVENOUS
  Filled 2018-11-05: qty 12

## 2018-11-05 NOTE — Patient Instructions (Signed)
[facility] Discharge Instructions for Patients Receiving Chemotherapy  Today you received the following chemotherapy agents   To help prevent nausea and vomiting after your treatment, we encourage you to take your nausea medication   If you develop nausea and vomiting that is not controlled by your nausea medication, call the clinic.   BELOW ARE SYMPTOMS THAT SHOULD BE REPORTED IMMEDIATELY:  *FEVER GREATER THAN 100.5 F  *CHILLS WITH OR WITHOUT FEVER  NAUSEA AND VOMITING THAT IS NOT CONTROLLED WITH YOUR NAUSEA MEDICATION  *UNUSUAL SHORTNESS OF BREATH  *UNUSUAL BRUISING OR BLEEDING  TENDERNESS IN MOUTH AND THROAT WITH OR WITHOUT PRESENCE OF ULCERS  *URINARY PROBLEMS  *BOWEL PROBLEMS  UNUSUAL RASH Items with * indicate a potential emergency and should be followed up as soon as possible.  Feel free to call the clinic should you have any questions or concerns. The clinic phone number is [phone].  Please show the CHEMO ALERT CARD at check-in to the Emergency Department and triage nurse.   

## 2018-11-05 NOTE — Progress Notes (Signed)
  Pt presents today for Taxol and Herceptin. VSS. Labs within parameters. VS within parameters. No changes since the last visit. MAR reviewed.   Treatment given today per MD orders. Tolerated infusion without adverse affects. Vital signs stable. No complaints at this time. Discharged from clinic ambulatory. F/U with [facility] as scheduled.

## 2018-11-11 ENCOUNTER — Other Ambulatory Visit: Payer: Self-pay

## 2018-11-12 ENCOUNTER — Encounter (HOSPITAL_COMMUNITY): Payer: Self-pay | Admitting: Hematology

## 2018-11-12 ENCOUNTER — Other Ambulatory Visit (HOSPITAL_COMMUNITY): Payer: Medicare Other

## 2018-11-12 ENCOUNTER — Inpatient Hospital Stay (HOSPITAL_COMMUNITY): Payer: Medicare Other

## 2018-11-12 ENCOUNTER — Inpatient Hospital Stay (HOSPITAL_BASED_OUTPATIENT_CLINIC_OR_DEPARTMENT_OTHER): Payer: Medicare Other | Admitting: Hematology

## 2018-11-12 VITALS — BP 141/38 | HR 66 | Temp 98.0°F | Resp 18

## 2018-11-12 DIAGNOSIS — R42 Dizziness and giddiness: Secondary | ICD-10-CM

## 2018-11-12 DIAGNOSIS — K5790 Diverticulosis of intestine, part unspecified, without perforation or abscess without bleeding: Secondary | ICD-10-CM

## 2018-11-12 DIAGNOSIS — C50212 Malignant neoplasm of upper-inner quadrant of left female breast: Secondary | ICD-10-CM

## 2018-11-12 DIAGNOSIS — Z170 Estrogen receptor positive status [ER+]: Secondary | ICD-10-CM | POA: Diagnosis not present

## 2018-11-12 DIAGNOSIS — I82431 Acute embolism and thrombosis of right popliteal vein: Secondary | ICD-10-CM

## 2018-11-12 DIAGNOSIS — Z5112 Encounter for antineoplastic immunotherapy: Secondary | ICD-10-CM | POA: Diagnosis not present

## 2018-11-12 DIAGNOSIS — M810 Age-related osteoporosis without current pathological fracture: Secondary | ICD-10-CM

## 2018-11-12 DIAGNOSIS — I82411 Acute embolism and thrombosis of right femoral vein: Secondary | ICD-10-CM

## 2018-11-12 DIAGNOSIS — Z853 Personal history of malignant neoplasm of breast: Secondary | ICD-10-CM

## 2018-11-12 DIAGNOSIS — K1379 Other lesions of oral mucosa: Secondary | ICD-10-CM

## 2018-11-12 DIAGNOSIS — Z87891 Personal history of nicotine dependence: Secondary | ICD-10-CM

## 2018-11-12 LAB — COMPREHENSIVE METABOLIC PANEL
ALT: 18 U/L (ref 0–44)
AST: 18 U/L (ref 15–41)
Albumin: 3.8 g/dL (ref 3.5–5.0)
Alkaline Phosphatase: 30 U/L — ABNORMAL LOW (ref 38–126)
Anion gap: 7 (ref 5–15)
BUN: 24 mg/dL — ABNORMAL HIGH (ref 8–23)
CO2: 25 mmol/L (ref 22–32)
Calcium: 9.2 mg/dL (ref 8.9–10.3)
Chloride: 105 mmol/L (ref 98–111)
Creatinine, Ser: 0.97 mg/dL (ref 0.44–1.00)
GFR calc Af Amer: >60 mL/min (ref >60)
GFR calc non Af Amer: 55 mL/min — ABNORMAL LOW (ref >60)
Glucose, Bld: 107 mg/dL — ABNORMAL HIGH (ref 70–99)
Potassium: 4 mmol/L (ref 3.5–5.1)
Sodium: 137 mmol/L (ref 135–145)
Total Bilirubin: 0.5 mg/dL (ref 0.3–1.2)
Total Protein: 6.9 g/dL (ref 6.5–8.1)

## 2018-11-12 LAB — CBC WITH DIFFERENTIAL/PLATELET
Abs Immature Granulocytes: 0.03 10*3/uL (ref 0.00–0.07)
Basophils Absolute: 0 10*3/uL (ref 0.0–0.1)
Basophils Relative: 1 %
Eosinophils Absolute: 0.1 10*3/uL (ref 0.0–0.5)
Eosinophils Relative: 2 %
HCT: 37.4 % (ref 36.0–46.0)
Hemoglobin: 12.1 g/dL (ref 12.0–15.0)
Immature Granulocytes: 1 %
Lymphocytes Relative: 30 %
Lymphs Abs: 1.7 10*3/uL (ref 0.7–4.0)
MCH: 30.1 pg (ref 26.0–34.0)
MCHC: 32.4 g/dL (ref 30.0–36.0)
MCV: 93 fL (ref 80.0–100.0)
Monocytes Absolute: 0.6 10*3/uL (ref 0.1–1.0)
Monocytes Relative: 11 %
Neutro Abs: 3 10*3/uL (ref 1.7–7.7)
Neutrophils Relative %: 55 %
Platelets: 203 10*3/uL (ref 150–400)
RBC: 4.02 MIL/uL (ref 3.87–5.11)
RDW: 13.2 % (ref 11.5–15.5)
WBC: 5.5 10*3/uL (ref 4.0–10.5)
nRBC: 0 % (ref 0.0–0.2)

## 2018-11-12 LAB — MAGNESIUM: Magnesium: 2.1 mg/dL (ref 1.7–2.4)

## 2018-11-12 MED ORDER — SODIUM CHLORIDE 0.9 % IV SOLN
40.0000 mg/m2 | Freq: Once | INTRAVENOUS | Status: AC
  Administered 2018-11-12: 72 mg via INTRAVENOUS
  Filled 2018-11-12: qty 12

## 2018-11-12 MED ORDER — TRASTUZUMAB CHEMO 150 MG IV SOLR
150.0000 mg | Freq: Once | INTRAVENOUS | Status: AC
  Administered 2018-11-12: 150 mg via INTRAVENOUS
  Filled 2018-11-12: qty 7.14

## 2018-11-12 MED ORDER — SODIUM CHLORIDE 0.9 % IV SOLN
20.0000 mg | Freq: Once | INTRAVENOUS | Status: AC
  Administered 2018-11-12: 20 mg via INTRAVENOUS
  Filled 2018-11-12: qty 100

## 2018-11-12 MED ORDER — ACETAMINOPHEN 325 MG PO TABS
650.0000 mg | ORAL_TABLET | Freq: Once | ORAL | Status: AC
  Administered 2018-11-12: 650 mg via ORAL
  Filled 2018-11-12: qty 2

## 2018-11-12 MED ORDER — SODIUM CHLORIDE 0.9 % IV SOLN
Freq: Once | INTRAVENOUS | Status: AC
  Administered 2018-11-12: 09:00:00 via INTRAVENOUS

## 2018-11-12 MED ORDER — HEPARIN SOD (PORK) LOCK FLUSH 100 UNIT/ML IV SOLN
500.0000 [IU] | Freq: Once | INTRAVENOUS | Status: AC | PRN
  Administered 2018-11-12: 500 [IU]

## 2018-11-12 MED ORDER — SODIUM CHLORIDE 0.9 % IV SOLN
20.0000 mg | Freq: Once | INTRAVENOUS | Status: AC
  Administered 2018-11-12: 20 mg via INTRAVENOUS
  Filled 2018-11-12: qty 20

## 2018-11-12 MED ORDER — DIPHENHYDRAMINE HCL 50 MG/ML IJ SOLN
25.0000 mg | Freq: Once | INTRAMUSCULAR | Status: AC
  Administered 2018-11-12: 25 mg via INTRAVENOUS
  Filled 2018-11-12: qty 1

## 2018-11-12 MED ORDER — SODIUM CHLORIDE 0.9% FLUSH
10.0000 mL | INTRAVENOUS | Status: DC | PRN
  Administered 2018-11-12 (×2): 10 mL
  Filled 2018-11-12 (×2): qty 10

## 2018-11-12 MED ORDER — FAMOTIDINE IN NACL 20-0.9 MG/50ML-% IV SOLN: 20.0000 mg | Freq: Once | INTRAVENOUS | Status: DC

## 2018-11-12 NOTE — Patient Instructions (Addendum)
[place] at [facility] Discharge Instructions  You were seen today by Dr. Delton Coombes. He went over your recent lab results were very good. He will see you back in 1 week for labs, treatment and follow up.   Thank you for choosing [place] at [facility] to provide your oncology and hematology care.  To afford each patient quality time with our provider, please arrive at least 15 minutes before your scheduled appointment time.   If you have a lab appointment with the [facility] please come in thru the  Main Entrance and check in at the main information desk  You need to re-schedule your appointment should you arrive 10 or more minutes late.  We strive to give you quality time with our providers, and arriving late affects you and other patients whose appointments are after yours.  Also, if you no show three or more times for appointments you may be dismissed from the clinic at the providers discretion.     Again, thank you for choosing [facility].  Our hope is that these requests will decrease the amount of time that you wait before being seen by our physicians.       _____________________________________________________________  Should you have questions after your visit to [facility], please contact our office at [phone] between the hours of 8:00 a.m. and 4:30 p.m.  Voicemails left after 4:00 p.m. will not be returned until the following business day.  For prescription refill requests, have your pharmacy contact our office and allow 72 hours.    Cancer Center Support Programs:   > Cancer Support Group  2nd Tuesday of the month 1pm-2pm, Journey Room

## 2018-11-12 NOTE — Progress Notes (Signed)
11/12/18  Decreasing dose of diphenhydramine to 25 mg IV due to age and no complications with previous treatments.  V.O. Dr Rhys Martini, PharmD

## 2018-11-12 NOTE — Patient Instructions (Signed)
[facility] Discharge Instructions for Patients Receiving Chemotherapy  Today you received the following chemotherapy agents   To help prevent nausea and vomiting after your treatment, we encourage you to take your nausea medication   If you develop nausea and vomiting that is not controlled by your nausea medication, call the clinic.   BELOW ARE SYMPTOMS THAT SHOULD BE REPORTED IMMEDIATELY:  *FEVER GREATER THAN 100.5 F  *CHILLS WITH OR WITHOUT FEVER  NAUSEA AND VOMITING THAT IS NOT CONTROLLED WITH YOUR NAUSEA MEDICATION  *UNUSUAL SHORTNESS OF BREATH  *UNUSUAL BRUISING OR BLEEDING  TENDERNESS IN MOUTH AND THROAT WITH OR WITHOUT PRESENCE OF ULCERS  *URINARY PROBLEMS  *BOWEL PROBLEMS  UNUSUAL RASH Items with * indicate a potential emergency and should be followed up as soon as possible.  Feel free to call the clinic should you have any questions or concerns. The clinic phone number is [phone].  Please show the CHEMO ALERT CARD at check-in to the Emergency Department and triage nurse.   

## 2018-11-12 NOTE — Progress Notes (Signed)
Pt seen by Dr. Delton Coombes today. Labs reviewed. Message received to proceed with treatment at this time. VSS and labs within parameters.    Treatment given today per MD orders. Tolerated infusion without adverse affects. Vital signs stable. No complaints at this time. Discharged from clinic ambulatory. F/U with [facility] as scheduled.

## 2018-11-12 NOTE — Assessment & Plan Note (Signed)
1.  Stage I (PT1CNX) HER-2 positive left breast cancer: - Abnormal mammogram at [facility], followed by ultrasound-guided biopsy on 06/23/2018.  IDC, HER-2 positive, ER/PR positive, Ki 67 2%. - Stereotactic biopsy in [place] on 06/29/2018, of the left breast irregular mass within the posterior upper, slightly inner quadrant.  A coil-shaped clip was placed. 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  Ki 67 was 10%. - Patient had a history of left breast cancer in 1998, status post lumpectomy, followed by chemotherapy and radiation, 1 year of tamoxifen which was switched to anastrozole for subsequent 4 years due to intolerance. - She had a history of right breast cancer in 2009, status post mastectomy.  She did not receive any adjuvant chemotherapy or radiation. - She was started on anastrozole on 07/21/2018, she took it for 2 weeks and felt very emotional.  She stopped taking it. -Left mastectomy on 08/19/2018, 1.1 cm IDC, grade 1, margins negative.  No lymph nodes were identified.,  ER was 100% positive, PR 7% positive and HER-2 3+ positive.  - I have called and talked to Dr. Claudette Laws (pathologist).  He clarified that there was an initial biopsy done on 06/23/2018, at [facility].  This report is not uploaded on epic.  The initial biopsy was reportedly positive for HER-2. -The second biopsy on 06/29/2018 was negative for HER-2.  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 - The second focus was not clearly identified on the mastectomy specimen. - Adjuvant chemotherapy with weekly paclitaxel for 12 cycles and Herceptin for 1 year was recommended. -2D echocardiogram on 10/07/2018 shows EF of 60 to 65%. - Weekly Herceptin and paclitaxel 40 mg/m started on 10/22/2018. -She received week 3 on 11/05/2018. -On 11/09/2018, she had one episode of lightheadedness.  This has gotten better on its own.  She denied  any diarrhea. -She denied any tingling or numbness in the extremities.  I have reviewed her medications.  Her blood pressure is within normal limits. -I will cut back on the dose of Benadryl to 36m today.  She may proceed with week 4 today. -I will see her back in 1 week for follow-up.  2.  Osteoporosis: - [facility]  She receives Prolia every 6 months at Dr. BCarmie Endoffice.   -Calcium today was normal.  She will continue calcium and vitamin D twice daily.   3.  Recurrent DVT: -She had a history of DVT in 2000's [place] until 5 years ago.  This was stopped secondary to bleed complicated by her ulcerative colitis. -She had a recurrent right leg femoral and popliteal DVT on 03/08/2018 and currently on Eliquis.  No bleeding reported.  4.  Ulcerative colitis: -She is currently on mesalamine and Humira.  She does have some constipation alternating with diarrhea from her UC. -It has not worsened since the start of Herceptin.

## 2018-11-12 NOTE — Progress Notes (Signed)
Monique Day, [place] [address]   CLINIC:  Medical Oncology/Hematology  PCP:  Octavio Graves, DO [address] [place] [address] [phone]   REASON FOR VISIT:  Chemotherapy for HER-2 positive left breast cancer.   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 breast ([place])   07/21/2018 Initial Diagnosis    Breast cancer of upper-inner quadrant of left female breast ([place])    10/22/2018 -  Chemotherapy    The patient had trastuzumab (HERCEPTIN) 300 mg in sodium chloride 0.9 % 250 mL chemo infusion, 315 mg, Intravenous,  Once, 1 of 16 cycles Administration: 300 mg (10/22/2018), 150 mg (10/29/2018), 150 mg (11/05/2018) PACLitaxel (TAXOL) 72 mg in sodium chloride 0.9 % 150 mL chemo infusion (</= 38m/m2), 40 mg/m2 = 72 mg (50 % of original dose 80 mg/m2), Intravenous,  Once, 1 of 3 cycles Dose modification: 40 mg/m2 (50 % of original dose 80 mg/m2, Cycle 1, Reason: Provider Judgment) Administration: 72 mg (10/22/2018), 72 mg (10/29/2018), 72 mg (11/05/2018)  for chemotherapy treatment.       CANCER STAGING: Cancer Staging No matching staging information was found for the patient.   INTERVAL HISTORY:  [name] female returns for routine follow-up and consideration for next cycle of chemotherapy. She is here today alone. She states that she had a dizzy spell Tuesday while cooking, she had to sit down until it passed. She states that she has noticed mouth sores in the paste few days. She was educated on treatment of mouth sores and prevention. Denies any nausea, vomiting, or diarrhea. Denies any new pains. Had not noticed any recent bleeding such as epistaxis, hematuria or hematochezia. Denies recent chest pain on exertion, shortness of breath on minimal exertion, pre-syncopal episodes, or palpitations. Denies any numbness or tingling in hands or feet. Denies any recent fevers, infections, or recent  hospitalizations. Patient reports appetite at 100% and energy level at 100%.      REVIEW OF SYSTEMS:  Review of Systems  HENT:   Positive for mouth sores.   Neurological: Positive for dizziness.     PAST MEDICAL/SURGICAL HISTORY:  Past Medical History:  Diagnosis Date  . Adult idiopathic generalized osteoporosis   . Arthritis    "minor" (08/18/2018)  . [name]   lumpectomy  . Breast cancer, right breast ([name]   lumpectomy; mastectomy  . DVT (deep venous thrombosis) ([name] "early 2000's"   RLE  . D[place] (deep venous thrombosis) ([name]   . Hemorrhoids, external   . History of blood transfusion    "related to ulcerative colitis"  . Lower abdominal pain   . Occult blood in stools   . Peripheral edema   . Seizures ([name]    one very slight seizure with the stroke; somewhere between 2005-2009  . Stroke ([facility] 2005-2009   "very light;" ; denies residual on 08/19/2018  . UC (ulcerative colitis) ([name] [address]0  Past Surgical History:  Procedure Laterality Date  . APPENDECTOMY    . bone density  12/11/10  . BREAST BIOPSY Left 1998; 2019 X 2  . BREAST BIOPSY Right 2009  . BREAST LUMPECTOMY Left 1998  . CATARACT EXTRACTION W/ INTRAOCULAR LENS  IMPLANT, BILATERAL Bilateral   . COLONOSCOPY  02/21/2011  . COLONOSCOPY  10/06/2008  . COLONOSCOPY  12/27/01  . COLONOSCOPY  05/09/05  . COLONOSCOPY N/A 07/02/2016   Procedure: COLONOSCOPY;  Surgeon: [name], MD;  Location: [facility];  Service: Endoscopy;  Laterality: N/A;  1055  . [place]   "I was having rectal bleeding"  . FRACTURE SURGERY    . MASTECTOMY Right 2009  . MASTECTOMY COMPLETE / SIMPLE Left 08/19/2018  . PORTACATH PLACEMENT Right 10/15/2018   Procedure: INSERTION PORT-A-CATH RIGHT SUBCLAVIN;  Surgeon: [name], MD;  Location: [facility] ORS;  Service: General;  Laterality: Right;  pt knows to arrive at 7:30  . SIMPLE MASTECTOMY WITH AXILLARY SENTINEL NODE BIOPSY  Left 08/19/2018   Procedure: LEFT SIMPLE MASTECTOMY;  Surgeon: [name], MD;  Location: [facility];  Service: General;  Laterality: Left;  . TONSILLECTOMY    . VENA CAVA FILTER PLACEMENT Right   . WRIST FRACTURE SURGERY Right      SOCIAL HISTORY:  Social History   Socioeconomic History  . Marital status: Widowed    Spouse name: Not on file  . Number of children: 0  . Years of education: Not on file  . Highest education level: Not on file  Occupational History  . Occupation: Optometrist: [employer]  Social Needs  . Financial resource strain: Not hard at all  . Food insecurity:    Worry: Never true    Inability: Never true  . Transportation needs:    Medical: No    Non-medical: No  Tobacco Use  . Smoking status: Former Smoker    Packs/day: 2.00    Years: 40.00    Pack years: 80.00    Types: Cigarettes    Last attempt to quit: 05/05/2001    Years since quitting: 17.5  . Smokeless tobacco: Never Used  Substance and Sexual Activity  . Alcohol use: Yes    Comment: 08/18/2018 "couple drinks/year"  . Drug use: Never  . Sexual activity: Not Currently  Lifestyle  . Physical activity:    Days per week: 0 days    Minutes per session: 0 min  . Stress: Not at all  Relationships  . Social connections:    Talks on phone: More than three times a week    Gets together: More than three times a week    Attends religious service: More than 4 times per year    Active member of club or organization: Yes    Attends meetings of clubs or organizations: More than 4 times per year    Relationship status: Widowed  . Intimate partner violence:    Fear of current or ex partner: No    Emotionally abused: No    Physically abused: No    Forced sexual activity: No  Other Topics Concern  . Not on file  Social History Narrative  . Not on file    FAMILY HISTORY:  Family History  Problem Relation Age of Onset  . Prostate cancer Father   . Colon cancer Neg  Hx     CURRENT MEDICATIONS:  Outpatient Encounter Medications as of 11/12/2018  Medication Sig Note  . acetaminophen (TYLENOL) 500 MG tablet Take 500 mg by mouth every 6 (six) hours as needed for moderate pain or headache.    Marland Kitchen apixaban (ELIQUIS) 5 MG TABS tablet Take 2 tablets (10 mg total) by mouth 2 (two) times daily. Then 1 tablet (5 mg) two times daily starting 03/16/18 (Patient taking differently: Take 2.5 mg by mouth 2 (two) times daily. )   . Ascorbic Acid (VITAMIN C) 1000 MG tablet Take 1,000 mg by  mouth daily.   . Calcium Carb-Cholecalciferol (CALCIUM 500 + D3) 500-600 MG-UNIT TABS Take 1 tablet by mouth daily.    Marland Kitchen denosumab (PROLIA) 60 MG/ML SOLN injection Inject 60 mg into the skin every 6 (six) months. Administer in upper arm, thigh, or abdomen 10/07/2018: LAST DOSE:09/2018  . HUMIRA PEN 40 MG/0.8ML PNKT INJECT 40 MG SUBQ EVERY 14 DAYS. (Patient taking differently: Inject 40 mg as directed every 14 (fourteen) days. SATURDAYS.)   . lidocaine-prilocaine (EMLA) cream Apply to port site 1 hour prior to appointment 10/07/2018: NEW MEDICATION (PATIENT HAS NOT CHEMOTHERAPY TO DATE)  . Magnesium 500 MG TABS Take 500 mg by mouth daily.   . Naphazoline-Pheniramine (OPCON-A) 0.027-0.315 % SOLN Place 1 drop into both eyes 3 (three) times daily as needed (dry eyes).  09/21/2018: As needed , rare per patient  . PACLitaxel (TAXOL IV) Inject into the vein once a week. Weekly x 12 cycles starting 10/22/2018 10/07/2018: NEW MEDICATION (PATIENT HAS NOT CHEMOTHERAPY TO DATE)   . polyethylene glycol powder (GLYCOLAX/MIRALAX) powder Take 8.5 g by mouth daily as needed. (Patient taking differently: Take 8.5 g by mouth daily as needed (constipation.). )   . PRESCRIPTION MEDICATION Apply 1 application topically 2 (two) times daily as needed (eczema). Ketoconazole-fluticasone 1:1 compounded cream  08/03/2018: Compounded at [facility]  . prochlorperazine (COMPAZINE) 10 MG tablet Take 1 tablet (10 mg total) by mouth every  6 (six) hours as needed (Nausea or vomiting). 10/07/2018: NEW MEDICATION (PATIENT HAS NOT CHEMOTHERAPY TO DATE)   . traMADol (ULTRAM) 50 MG tablet Take 1 tablet (50 mg total) by mouth every 6 (six) hours as needed.   . Trastuzumab (HERCEPTIN IV) Inject into the vein every 21 ( twenty-one) days. Every 3 weeks x 1 year starting 10/22/2018 10/07/2018: NEW THERAPY PATIENT HAS NOT STARTED YET  . mesalamine (APRISO) 0.375 g 24 hr capsule Take 4 capsules by mouth each morning. (Patient not taking: Reported on 11/12/2018)    No facility-administered encounter medications on file as of 11/12/2018.     ALLERGIES:  Allergies  Allergen Reactions  . Mercaptopurine Other (See Comments)    Dropped WBC  . Sulfa Antibiotics Other (See Comments)    Extreme Weakness     PHYSICAL EXAM:  ECOG Performance status: 1  I reviewed her vitals.  Blood pressure is 145/68.  Pulse rate is 78.  Respirate is 18.  Temperature 98.2.  Saturations 99%.  Physical Exam Constitutional:      Appearance: Normal appearance.  Cardiovascular:     Rate and Rhythm: Normal rate and regular rhythm.     Heart sounds: Normal heart sounds.  Pulmonary:     Effort: Pulmonary effort is normal.     Breath sounds: Normal breath sounds.  Abdominal:     Palpations: Abdomen is soft. There is no mass.  Musculoskeletal:        General: No swelling.  Skin:    General: Skin is warm.  Neurological:     General: No focal deficit present.     Mental Status: She is alert and oriented to person, place, and time.  Psychiatric:        Mood and Affect: Mood normal.        Behavior: Behavior normal.      LABORATORY DATA:  I have reviewed the labs as listed.  CBC    Component Value Date/Time   WBC 5.5 11/12/2018 0806   RBC 4.02 11/12/2018 0806   HGB 12.1 11/12/2018 0806   HGB 14.4  03/10/2017 1254   HCT 37.4 11/12/2018 0806   HCT 41.5 03/10/2017 1254   PLT 203 11/12/2018 0806   PLT 249 03/10/2017 1254   MCV 93.0 11/12/2018 0806   MCV  91 03/10/2017 1254   MCH 30.1 11/12/2018 0806   MCHC 32.4 11/12/2018 0806   RDW 13.2 11/12/2018 0806   RDW 13.3 03/10/2017 1254   LYMPHSABS 1.7 11/12/2018 0806   LYMPHSABS 2.7 03/10/2017 1254   MONOABS 0.6 11/12/2018 0806   EOSABS 0.1 11/12/2018 0806   EOSABS 0.4 03/10/2017 1254   BASOSABS 0.0 11/12/2018 0806   BASOSABS 0.1 03/10/2017 1254   CMP Latest Ref Rng & Units 11/12/2018 11/05/2018 10/29/2018  Glucose 70 - 99 mg/dL 107(H) 103(H) 103(H)  BUN 8 - 23 mg/dL 24(H) 22 21  Creatinine 0.44 - 1.00 mg/dL 0.97 0.98 0.92  Sodium 135 - 145 mmol/L 137 138 139  Potassium 3.5 - 5.1 mmol/L 4.0 3.8 4.0  Chloride 98 - 111 mmol/L 105 107 106  CO2 22 - 32 mmol/L 25 25 27   Calcium 8.9 - 10.3 mg/dL 9.2 8.6(L) 9.0  Total Protein 6.5 - 8.1 g/dL 6.9 6.9 6.7  Total Bilirubin 0.3 - 1.2 mg/dL 0.5 0.6 0.4  Alkaline Phos 38 - 126 U/L 30(L) 30(L) 30(L)  AST 15 - 41 U/L 18 16 17   ALT 0 - 44 U/L 18 19 19        DIAGNOSTIC IMAGING:  I have independently reviewed the scans and discussed with the patient.   I have reviewed Venita Lick LPN's note and agree with the documentation.  I personally performed a face-to-face visit, made revisions and my assessment and plan is as follows.    ASSESSMENT & PLAN:   Breast cancer of upper-inner quadrant of left female breast ([place]) 1.  Stage I (PT1CNX) HER-2 positive left breast cancer: - Abnormal mammogram at [facility], followed by ultrasound-guided biopsy on 06/23/2018.  IDC, HER-2 positive, ER/PR positive, Ki 67 2%. - Stereotactic biopsy in [place] on 06/29/2018, of the left breast irregular mass within the posterior upper, slightly inner quadrant.  A coil-shaped clip was placed. 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  Ki 67 was 10%. - Patient had a history of left breast cancer in 1998, status post lumpectomy, followed by chemotherapy and radiation, 1 year of tamoxifen which was switched to anastrozole for  subsequent 4 years due to intolerance. - She had a history of right breast cancer in 2009, status post mastectomy.  She did not receive any adjuvant chemotherapy or radiation. - She was started on anastrozole on 07/21/2018, she took it for 2 weeks and felt very emotional.  She stopped taking it. -Left mastectomy on 08/19/2018, 1.1 cm IDC, grade 1, margins negative.  No lymph nodes were identified.,  ER was 100% positive, PR 7% positive and HER-2 3+ positive.  - I have called and talked to Dr. Claudette Laws (pathologist).  He clarified that there was an initial biopsy done on 06/23/2018, at [name].  This report is not uploaded on epic.  The initial biopsy was reportedly positive for HER-2. -The second biopsy on 06/29/2018 was negative for HER-2.  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 - The second focus was not clearly identified on the mastectomy specimen. - Adjuvant chemotherapy with weekly paclitaxel for 12 cycles and Herceptin for 1 year was recommended. -2D echocardiogram on 10/07/2018 shows EF of 60 to 65%. -  Weekly Herceptin and paclitaxel 40 mg/m started on 10/22/2018. -She received week 3 on 11/05/2018. -On 11/09/2018, she had one episode of lightheadedness.  This has gotten better on its own.  She denied any diarrhea. -She denied any tingling or numbness in the extremities.  I have reviewed her medications.  Her blood pressure is within normal limits. -I will cut back on the dose of Benadryl to 27m today.  She may proceed with week 4 today. -I will see her back in 1 week for follow-up.  2.  Osteoporosis: - [facility]  She receives Prolia every 6 months at Dr. BCarmie Endoffice.   -Calcium today was normal.  She will continue calcium and vitamin D twice daily.   3.  Recurrent DVT: -She had a history of DVT in 2000's [place] until 5 years ago.  This was stopped  secondary to bleed complicated by her ulcerative colitis. -She had a recurrent right leg femoral and popliteal DVT on 03/08/2018 and currently on Eliquis.  No bleeding reported.  4.  Ulcerative colitis: -She is currently on mesalamine and Humira.  She does have some constipation alternating with diarrhea from her UC. -It has not worsened since the start of Herceptin.        Orders placed this encounter:  No orders of the defined types were placed in this encounter.     SDerek Jack MD [phone]

## 2018-11-12 NOTE — Patient Instructions (Signed)
[facility] Discharge Instructions  Labs drawn from portacath today   Thank you for choosing [facility] at [facility] to provide your oncology and hematology care.  To afford each patient quality time with our provider, please arrive at least 15 minutes before your scheduled appointment time.   If you have a lab appointment with the Cancer Center please come in thru the  Main Entrance and check in at the main information desk  You need to re-schedule your appointment should you arrive 10 or more minutes late.  We strive to give you quality time with our providers, and arriving late affects you and other patients whose appointments are after yours.  Also, if you no show three or more times for appointments you may be dismissed from the clinic at the providers discretion.     Again, thank you for choosing [facility].  Our hope is that these requests will decrease the amount of time that you wait before being seen by our physicians.       _____________________________________________________________  Should you have questions after your visit to  Cancer Center, please contact our office at [phone] between the hours of 8:00 a.m. and 4:30 p.m.  Voicemails left after 4:00 p.m. will not be returned until the following business day.  For prescription refill requests, have your pharmacy contact our office and allow 72 hours.    Cancer Center Support Programs:   > Cancer Support Group  2nd Tuesday of the month 1pm-2pm, Journey Room   

## 2018-11-19 ENCOUNTER — Inpatient Hospital Stay (HOSPITAL_BASED_OUTPATIENT_CLINIC_OR_DEPARTMENT_OTHER): Payer: Medicare Other | Admitting: Hematology

## 2018-11-19 ENCOUNTER — Encounter (HOSPITAL_COMMUNITY): Payer: Self-pay | Admitting: Hematology

## 2018-11-19 ENCOUNTER — Other Ambulatory Visit: Payer: Self-pay

## 2018-11-19 ENCOUNTER — Inpatient Hospital Stay (HOSPITAL_COMMUNITY): Payer: Medicare Other | Attending: Hematology

## 2018-11-19 ENCOUNTER — Inpatient Hospital Stay (HOSPITAL_COMMUNITY): Payer: Medicare Other

## 2018-11-19 ENCOUNTER — Encounter (HOSPITAL_COMMUNITY): Payer: Self-pay

## 2018-11-19 VITALS — BP 128/61 | HR 78 | Temp 98.4°F | Resp 18 | Wt 173.8 lb

## 2018-11-19 VITALS — BP 131/64 | HR 72 | Temp 98.0°F | Resp 18

## 2018-11-19 DIAGNOSIS — Z5112 Encounter for antineoplastic immunotherapy: Secondary | ICD-10-CM | POA: Diagnosis not present

## 2018-11-19 DIAGNOSIS — Z7901 Long term (current) use of anticoagulants: Secondary | ICD-10-CM | POA: Insufficient documentation

## 2018-11-19 DIAGNOSIS — C50212 Malignant neoplasm of upper-inner quadrant of left female breast: Secondary | ICD-10-CM

## 2018-11-19 DIAGNOSIS — Z170 Estrogen receptor positive status [ER+]: Secondary | ICD-10-CM

## 2018-11-19 DIAGNOSIS — R197 Diarrhea, unspecified: Secondary | ICD-10-CM | POA: Diagnosis not present

## 2018-11-19 DIAGNOSIS — K5190 Ulcerative colitis, unspecified, without complications: Secondary | ICD-10-CM | POA: Insufficient documentation

## 2018-11-19 DIAGNOSIS — K5900 Constipation, unspecified: Secondary | ICD-10-CM | POA: Insufficient documentation

## 2018-11-19 DIAGNOSIS — Z5111 Encounter for antineoplastic chemotherapy: Secondary | ICD-10-CM | POA: Diagnosis present

## 2018-11-19 DIAGNOSIS — Z87891 Personal history of nicotine dependence: Secondary | ICD-10-CM | POA: Diagnosis not present

## 2018-11-19 DIAGNOSIS — Z86718 Personal history of other venous thrombosis and embolism: Secondary | ICD-10-CM

## 2018-11-19 DIAGNOSIS — M810 Age-related osteoporosis without current pathological fracture: Secondary | ICD-10-CM | POA: Insufficient documentation

## 2018-11-19 LAB — CBC WITH DIFFERENTIAL/PLATELET
Abs Immature Granulocytes: 0.05 10*3/uL (ref 0.00–0.07)
Basophils Absolute: 0 10*3/uL (ref 0.0–0.1)
Basophils Relative: 1 %
Eosinophils Absolute: 0.1 10*3/uL (ref 0.0–0.5)
Eosinophils Relative: 2 %
HCT: 37.5 % (ref 36.0–46.0)
Hemoglobin: 12 g/dL (ref 12.0–15.0)
Immature Granulocytes: 1 %
Lymphocytes Relative: 28 %
Lymphs Abs: 1.4 10*3/uL (ref 0.7–4.0)
MCH: 30 pg (ref 26.0–34.0)
MCHC: 32 g/dL (ref 30.0–36.0)
MCV: 93.8 fL (ref 80.0–100.0)
Monocytes Absolute: 0.6 10*3/uL (ref 0.1–1.0)
Monocytes Relative: 11 %
Neutro Abs: 2.8 10*3/uL (ref 1.7–7.7)
Neutrophils Relative %: 57 %
Platelets: 204 10*3/uL (ref 150–400)
RBC: 4 MIL/uL (ref 3.87–5.11)
RDW: 13.5 % (ref 11.5–15.5)
WBC: 4.9 10*3/uL (ref 4.0–10.5)
nRBC: 0 % (ref 0.0–0.2)

## 2018-11-19 LAB — COMPREHENSIVE METABOLIC PANEL
ALT: 19 U/L (ref 0–44)
AST: 18 U/L (ref 15–41)
Albumin: 3.7 g/dL (ref 3.5–5.0)
Alkaline Phosphatase: 29 U/L — ABNORMAL LOW (ref 38–126)
Anion gap: 7 (ref 5–15)
BUN: 22 mg/dL (ref 8–23)
CO2: 25 mmol/L (ref 22–32)
Calcium: 8.5 mg/dL — ABNORMAL LOW (ref 8.9–10.3)
Chloride: 106 mmol/L (ref 98–111)
Creatinine, Ser: 0.89 mg/dL (ref 0.44–1.00)
GFR calc Af Amer: >60 mL/min (ref >60)
GFR calc non Af Amer: >60 mL/min (ref >60)
Glucose, Bld: 105 mg/dL — ABNORMAL HIGH (ref 70–99)
Potassium: 4.2 mmol/L (ref 3.5–5.1)
Sodium: 138 mmol/L (ref 135–145)
Total Bilirubin: 0.6 mg/dL (ref 0.3–1.2)
Total Protein: 6.7 g/dL (ref 6.5–8.1)

## 2018-11-19 LAB — MAGNESIUM: Magnesium: 2.4 mg/dL (ref 1.7–2.4)

## 2018-11-19 MED ORDER — SODIUM CHLORIDE 0.9 % IV SOLN
Freq: Once | INTRAVENOUS | Status: AC
  Administered 2018-11-19: 10:00:00 via INTRAVENOUS

## 2018-11-19 MED ORDER — SODIUM CHLORIDE 0.9 % IV SOLN
20.0000 mg | Freq: Once | INTRAVENOUS | Status: AC
  Administered 2018-11-19: 20 mg via INTRAVENOUS
  Filled 2018-11-19: qty 20

## 2018-11-19 MED ORDER — ACETAMINOPHEN 325 MG PO TABS
650.0000 mg | ORAL_TABLET | Freq: Once | ORAL | Status: AC
  Administered 2018-11-19: 650 mg via ORAL
  Filled 2018-11-19: qty 2

## 2018-11-19 MED ORDER — SODIUM CHLORIDE 0.9% FLUSH
10.0000 mL | INTRAVENOUS | Status: DC | PRN
  Administered 2018-11-19: 10 mL
  Filled 2018-11-19: qty 10

## 2018-11-19 MED ORDER — SODIUM CHLORIDE 0.9 % IV SOLN
40.0000 mg/m2 | Freq: Once | INTRAVENOUS | Status: AC
  Administered 2018-11-19: 72 mg via INTRAVENOUS
  Filled 2018-11-19: qty 12

## 2018-11-19 MED ORDER — SODIUM CHLORIDE 0.9 % IV SOLN
20.0000 mg | Freq: Once | INTRAVENOUS | Status: AC
  Administered 2018-11-19: 11:00:00 20 mg via INTRAVENOUS
  Filled 2018-11-19: qty 2

## 2018-11-19 MED ORDER — HEPARIN SOD (PORK) LOCK FLUSH 100 UNIT/ML IV SOLN
500.0000 [IU] | Freq: Once | INTRAVENOUS | Status: AC | PRN
  Administered 2018-11-19: 500 [IU]

## 2018-11-19 MED ORDER — TRASTUZUMAB CHEMO 150 MG IV SOLR
150.0000 mg | Freq: Once | INTRAVENOUS | Status: AC
  Administered 2018-11-19: 150 mg via INTRAVENOUS
  Filled 2018-11-19: qty 7.14

## 2018-11-19 MED ORDER — DIPHENHYDRAMINE HCL 50 MG/ML IJ SOLN
25.0000 mg | Freq: Once | INTRAMUSCULAR | Status: AC
  Administered 2018-11-19: 25 mg via INTRAVENOUS
  Filled 2018-11-19: qty 1

## 2018-11-19 MED ORDER — FAMOTIDINE IN NACL 20-0.9 MG/50ML-% IV SOLN: 20.0000 mg | Freq: Once | INTRAVENOUS | Status: DC

## 2018-11-19 NOTE — Progress Notes (Signed)
Treatment given per orders. Patient tolerated it well without problems. Vitals stable and discharged home from clinic ambulatory. Follow up as scheduled.  

## 2018-11-19 NOTE — Patient Instructions (Signed)
[facility] Discharge Instructions for Patients Receiving Chemotherapy  Today you received the following chemotherapy agents   To help prevent nausea and vomiting after your treatment, we encourage you to take your nausea medication   If you develop nausea and vomiting that is not controlled by your nausea medication, call the clinic.   BELOW ARE SYMPTOMS THAT SHOULD BE REPORTED IMMEDIATELY:  *FEVER GREATER THAN 100.5 F  *CHILLS WITH OR WITHOUT FEVER  NAUSEA AND VOMITING THAT IS NOT CONTROLLED WITH YOUR NAUSEA MEDICATION  *UNUSUAL SHORTNESS OF BREATH  *UNUSUAL BRUISING OR BLEEDING  TENDERNESS IN MOUTH AND THROAT WITH OR WITHOUT PRESENCE OF ULCERS  *URINARY PROBLEMS  *BOWEL PROBLEMS  UNUSUAL RASH Items with * indicate a potential emergency and should be followed up as soon as possible.  Feel free to call the clinic should you have any questions or concerns. The clinic phone number is [phone].  Please show the CHEMO ALERT CARD at check-in to the Emergency Department and triage nurse.   

## 2018-11-19 NOTE — Assessment & Plan Note (Signed)
1.  Stage I (PT1CNX) HER-2 positive left breast cancer: - Abnormal mammogram at [facility], followed by ultrasound-guided biopsy on 06/23/2018.  IDC, HER-2 positive, ER/PR positive, Ki 67 2%. - Stereotactic biopsy in [place] on 06/29/2018, of the left breast irregular mass within the posterior upper, slightly inner quadrant.  A coil-shaped clip was placed. 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  Ki 67 was 10%. - Patient had a history of left breast cancer in 1998, status post lumpectomy, followed by chemotherapy and radiation, 1 year of tamoxifen which was switched to anastrozole for subsequent 4 years due to intolerance. - She had a history of right breast cancer in 2009, status post mastectomy.  She did not receive any adjuvant chemotherapy or radiation. - She was started on anastrozole on 07/21/2018, she took it for 2 weeks and felt very emotional.  She stopped taking it. -Left mastectomy on 08/19/2018, 1.1 cm IDC, grade 1, margins negative.  No lymph nodes were identified.,  ER was 100% positive, PR 7% positive and HER-2 3+ positive.  - I have called and talked to Dr. Claudette Laws (pathologist).  He clarified that there was an initial biopsy done on 06/23/2018, at [facility].  This report is not uploaded on epic.  The initial biopsy was reportedly positive for HER-2. -The second biopsy on 06/29/2018 was negative for HER-2.  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 - The second focus was not clearly identified on the mastectomy specimen. - Adjuvant chemotherapy with weekly paclitaxel for 12 cycles and Herceptin for 1 year was recommended. -2D echocardiogram on 10/07/2018 shows EF of 60 to 65%. - Weekly Herceptin and paclitaxel at 40 mg per metered squared started on 10/22/2018. -Week 4 treatment was on 11/12/2018. -She has tolerated her last treatment very well.  She felt very energetic for 1 day  after last treatment, likely from dexamethasone.  She denied any numbness or tingling in the extremities.  Denies any GI symptoms. -I reviewed her blood work.  She may proceed with week 5 today. -She will come back in 1 week for follow-up and her next treatment.  2.  Osteoporosis: -She had DEXA scan at [facility].  She receives Prolia every 6 months at Dr. Carmie End office. -She will continue calcium and vitamin D twice daily.  3.  Recurrent DVT: -She had a history of DVT in 2000's after 1 week post trip to [place], treated with Coumadin until 5 years ago.  This was stopped secondary to bleed complicated by her ulcerative colitis. -She had a recurrent right leg femoral and popliteal DVT on 03/08/2018 and currently on Eliquis.  No bleeding reported.  4.  Ulcerative colitis: -She is currently on mesalamine and Humira.  She does have some constipation alternating with diarrhea from her UC. -It has not worsened since the start of Herceptin.

## 2018-11-19 NOTE — Progress Notes (Signed)
[facility] [place], [place] [address]   CLINIC:  Medical Oncology/Hematology  PCP:  Octavio Graves, DO [address] [place] [address] [phone]   REASON FOR VISIT:  Follow-up for left breast cancer and chemotherapy.   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 breast ([place])   07/21/2018 Initial Diagnosis    Breast cancer of upper-inner quadrant of left female breast ([place])    10/22/2018 -  Chemotherapy    The patient had trastuzumab (HERCEPTIN) 300 mg in sodium chloride 0.9 % 250 mL chemo infusion, 315 mg, Intravenous,  Once, 2 of 16 cycles Administration: 300 mg (10/22/2018), 150 mg (10/29/2018), 150 mg (11/19/2018), 150 mg (11/05/2018), 150 mg (11/12/2018) PACLitaxel (TAXOL) 72 mg in sodium chloride 0.9 % 150 mL chemo infusion (</= 36m/m2), 40 mg/m2 = 72 mg (50 % of original dose 80 mg/m2), Intravenous,  Once, 2 of 3 cycles Dose modification: 40 mg/m2 (50 % of original dose 80 mg/m2, Cycle 1, Reason: Provider Judgment) Administration: 72 mg (10/22/2018), 72 mg (10/29/2018), 72 mg (11/19/2018), 72 mg (11/05/2018), 72 mg (11/12/2018)  for chemotherapy treatment.       CANCER STAGING: Cancer Staging No matching staging information was found for the patient.   INTERVAL HISTORY:  Ms. [id number].[id number]. female returns for routine follow-up and consideration for next cycle of chemotherapy.She is here today alone. She states that the Saturday after her last treatment she had lots of energy from the steroid. Denies any nausea, vomiting, or diarrhea. Denies any new pains. Had not noticed any recent bleeding such as epistaxis, hematuria or hematochezia. Denies recent chest pain on exertion, shortness of breath on minimal exertion, pre-syncopal episodes, or palpitations. Denies any numbness or tingling in hands or feet. Denies any recent fevers, infections, or recent hospitalizations. Patient reports appetite at 100% and energy  level at 100%.   REVIEW OF SYSTEMS:  Review of Systems  Neurological: Positive for numbness.     PAST MEDICAL/SURGICAL HISTORY:  Past Medical History:  Diagnosis Date  . Adult idiopathic generalized osteoporosis   . Arthritis    "minor" (08/18/2018)  . Breast cancer, left breast ([facility] [place]   lumpectomy  . Breast cancer, right breast ([facility] 2009   lumpectomy; mastectomy  . DVT (deep venous thrombosis) ([facility] "early 2000's"   RLE  . D[facility] (deep venous thrombosis) ([facility]   . Hemorrhoids, external   . History of blood transfusion    "related to ulcerative colitis"  . Lower abdominal pain   . Occult blood in stools   . Peripheral edema   . Seizures ([facility]    one very slight seizure with the stroke; somewhere between 2005-2009  . Stroke ([facility] 2005-2009   "very light;" ; denies residual on 08/19/2018  . UC (ulcerative colitis) ([place] [address]  Past Surgical History:  Procedure Laterality Date  . APPENDECTOMY    . bone density  12/11/10  . BREAST BIOPSY Left 1998; 2019 X 2  . BREAST BIOPSY Right 2009  . BREAST LUMPECTOMY Left 1998  . CATARACT EXTRACTION W/ INTRAOCULAR LENS  IMPLANT, BILATERAL Bilateral   . COLONOSCOPY  02/21/2011  . COLONOSCOPY  10/06/2008  . COLONOSCOPY  12/27/01  . COLONOSCOPY  05/09/05  . COLONOSCOPY N/A 07/02/2016   Procedure: COLONOSCOPY;  Surgeon: [name] MD;  Location: [facility] SUITE;  Service: Endoscopy;  Laterality: N/A;  1055  . [place]  "  I was having rectal bleeding"  . FRACTURE SURGERY    . MASTECTOMY Right 2009  . MASTECTOMY COMPLETE / SIMPLE Left 08/19/2018  . PORTACATH PLACEMENT Right 10/15/2018   Procedure: INSERTION PORT-A-CATH RIGHT SUBCLAVIN;  Surgeon: [name], MD;  Location: [facility] ORS;  Service: General;  Laterality: Right;  pt knows to arrive at 7:30  . SIMPLE MASTECTOMY WITH AXILLARY SENTINEL NODE BIOPSY Left 08/19/2018   Procedure: LEFT SIMPLE MASTECTOMY;  Surgeon: [name], MD;  Location: [facility];   Service: General;  Laterality: Left;  . TONSILLECTOMY    . VENA CAVA FILTER PLACEMENT Right   . WRIST FRACTURE SURGERY Right      SOCIAL HISTORY:  Social History   Socioeconomic History  . Marital status: Widowed    Spouse name: Not on file  . Number of children: 0  . Years of education: Not on file  . Highest education level: Not on file  Occupational History  . Occupation: Optometrist: [employer]  Social Needs  . Financial resource strain: Not hard at all  . Food insecurity:    Worry: Never true    Inability: Never true  . Transportation needs:    Medical: No    Non-medical: No  Tobacco Use  . Smoking status: Former Smoker    Packs/day: 2.00    Years: 40.00    Pack years: 80.00    Types: Cigarettes    Last attempt to quit: 05/05/2001    Years since quitting: 17.5  . Smokeless tobacco: Never Used  Substance and Sexual Activity  . Alcohol use: Yes    Comment: 08/18/2018 "couple drinks/year"  . Drug use: Never  . Sexual activity: Not Currently  Lifestyle  . Physical activity:    Days per week: 0 days    Minutes per session: 0 min  . Stress: Not at all  Relationships  . Social connections:    Talks on phone: More than three times a week    Gets together: More than three times a week    Attends religious service: More than 4 times per year    Active member of club or organization: Yes    Attends meetings of clubs or organizations: More than 4 times per year    Relationship status: Widowed  . Intimate partner violence:    Fear of current or ex partner: No    Emotionally abused: No    Physically abused: No    Forced sexual activity: No  Other Topics Concern  . Not on file  Social History Narrative  . Not on file    FAMILY HISTORY:  Family History  Problem Relation Age of Onset  . Prostate cancer Father   . Colon cancer Neg Hx     CURRENT MEDICATIONS:  Outpatient Encounter Medications as of 11/19/2018  Medication Sig  Note  . acetaminophen (TYLENOL) 500 MG tablet Take 500 mg by mouth every 6 (six) hours as needed for moderate pain or headache.    Marland Kitchen apixaban (ELIQUIS) 5 MG TABS tablet Take 2 tablets (10 mg total) by mouth 2 (two) times daily. Then 1 tablet (5 mg) two times daily starting 03/16/18 (Patient taking differently: Take 2.5 mg by mouth 2 (two) times daily. )   . Ascorbic Acid (VITAMIN C) 1000 MG tablet Take 1,000 mg by mouth daily.   . Calcium Carb-Cholecalciferol (CALCIUM 500 + D3) 500-600 MG-UNIT TABS Take 1 tablet by mouth daily.    Marland Kitchen denosumab (PROLIA)  60 MG/ML SOLN injection Inject 60 mg into the skin every 6 (six) months. Administer in upper arm, thigh, or abdomen 10/07/2018: LAST DOSE:09/2018  . HUMIRA PEN 40 MG/0.8ML PNKT INJECT 40 MG SUBQ EVERY 14 DAYS. (Patient taking differently: Inject 40 mg as directed every 14 (fourteen) days. SATURDAYS.)   . lidocaine-prilocaine (EMLA) cream Apply to port site 1 hour prior to appointment 10/07/2018: NEW MEDICATION (PATIENT HAS NOT CHEMOTHERAPY TO DATE)  . Magnesium 500 MG TABS Take 500 mg by mouth daily.   . mesalamine (APRISO) 0.375 g 24 hr capsule Take 4 capsules by mouth each morning. (Patient not taking: Reported on 11/19/2018)   . Naphazoline-Pheniramine (OPCON-A) 0.027-0.315 % SOLN Place 1 drop into both eyes 3 (three) times daily as needed (dry eyes).  09/21/2018: As needed , rare per patient  . PACLitaxel (TAXOL IV) Inject into the vein once a week. Weekly x 12 cycles starting 10/22/2018 10/07/2018: NEW MEDICATION (PATIENT HAS NOT CHEMOTHERAPY TO DATE)   . polyethylene glycol powder (GLYCOLAX/MIRALAX) powder Take 8.5 g by mouth daily as needed. (Patient taking differently: Take 8.5 g by mouth daily as needed (constipation.). )   . PRESCRIPTION MEDICATION Apply 1 application topically 2 (two) times daily as needed (eczema). Ketoconazole-fluticasone 1:1 compounded cream  08/03/2018: Compounded at [facility]  . prochlorperazine (COMPAZINE) 10 MG tablet Take 1 tablet  (10 mg total) by mouth every 6 (six) hours as needed (Nausea or vomiting). 10/07/2018: NEW MEDICATION (PATIENT HAS NOT CHEMOTHERAPY TO DATE)   . traMADol (ULTRAM) 50 MG tablet Take 1 tablet (50 mg total) by mouth every 6 (six) hours as needed.   . Trastuzumab (HERCEPTIN IV) Inject into the vein every 21 ( twenty-one) days. Every 3 weeks x 1 year starting 10/22/2018 10/07/2018: NEW THERAPY PATIENT HAS NOT STARTED YET   No facility-administered encounter medications on file as of 11/19/2018.     ALLERGIES:  Allergies  Allergen Reactions  . Mercaptopurine Other (See Comments)    Dropped WBC  . Sulfa Antibiotics Other (See Comments)    Extreme Weakness     PHYSICAL EXAM:  ECOG Performance status: 1  Vitals:   11/19/18 0847  BP: 128/61  Pulse: 78  Resp: 18  Temp: 98.4 F (36.9 C)  SpO2: 97%   Filed Weights   11/19/18 0847  Weight: 173 lb 12.8 oz (78.8 kg)    Physical Exam Constitutional:      Appearance: Normal appearance.  Cardiovascular:     Rate and Rhythm: Normal rate and regular rhythm.     Heart sounds: Normal heart sounds.  Pulmonary:     Effort: Pulmonary effort is normal.     Breath sounds: Normal breath sounds.  Abdominal:     General: There is no distension.     Palpations: Abdomen is soft.     Tenderness: There is no abdominal tenderness.  Musculoskeletal:        General: No swelling.  Skin:    General: Skin is warm.  Neurological:     General: No focal deficit present.     Mental Status: She is alert and oriented to person, place, and time.  Psychiatric:        Mood and Affect: Mood normal.        Behavior: Behavior normal.      LABORATORY DATA:  I have reviewed the labs as listed.  CBC    Component Value Date/Time   WBC 4.9 11/19/2018 0814   RBC 4.00 11/19/2018 0814   HGB 12.0  11/19/2018 0814   HGB 14.4 03/10/2017 1254   HCT 37.5 11/19/2018 0814   HCT 41.5 03/10/2017 1254   PLT 204 11/19/2018 0814   PLT 249 03/10/2017 1254   MCV 93.8  11/19/2018 0814   MCV 91 03/10/2017 1254   MCH 30.0 11/19/2018 0814   MCHC 32.0 11/19/2018 0814   RDW 13.5 11/19/2018 0814   RDW 13.3 03/10/2017 1254   LYMPHSABS 1.4 11/19/2018 0814   LYMPHSABS 2.7 03/10/2017 1254   MONOABS 0.6 11/19/2018 0814   EOSABS 0.1 11/19/2018 0814   EOSABS 0.4 03/10/2017 1254   BASOSABS 0.0 11/19/2018 0814   BASOSABS 0.1 03/10/2017 1254   CMP Latest Ref Rng & Units 11/19/2018 11/12/2018 11/05/2018  Glucose 70 - 99 mg/dL 105(H) 107(H) 103(H)  BUN 8 - 23 mg/dL 22 24(H) 22  Creatinine 0.44 - 1.00 mg/dL 0.89 0.97 0.98  Sodium 135 - 145 mmol/L 138 137 138  Potassium 3.5 - 5.1 mmol/L 4.2 4.0 3.8  Chloride 98 - 111 mmol/L 106 105 107  CO2 22 - 32 mmol/L _0 Calcium 8.9 - 10.3 mg/dL 8.5(L) 9.2 8.6(L)  Total Protein 6.5 - 8.1 g/dL 6.7 6.9 6.9  Total Bilirubin 0.3 - 1.2 mg/dL 0.6 0.5 0.6  Alkaline Phos 38 - 126 U/L 29(L) 30(L) 30(L)  AST 15 - 41 U/L _1 ALT 0 - 44 U/L _2 DIAGNOSTIC IMAGING:  I have independently reviewed the scans and discussed with the patient.   I have reviewed Venita Lick LPN's note and agree with the documentation.  I personally performed a face-to-face visit, made revisions and my assessment and plan is as follows.    ASSESSMENT & PLAN:   Breast cancer of upper-inner quadrant of left female breast ([place]) 1.  Stage I (PT1CNX) HER-2 positive left breast cancer: - Abnormal mammogram at [facility], followed by ultrasound-guided biopsy on 06/23/2018.  IDC, HER-2 positive, ER/PR positive, Ki 67 2%. - Stereotactic biopsy in [place] on 06/29/2018, of the left breast irregular mass within the posterior upper, slightly inner quadrant.  A coil-shaped clip was placed. 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  Ki 67 was 10%. - Patient had a history of left breast cancer in 1998, status post lumpectomy, followed by chemotherapy and radiation, 1 year of tamoxifen which was  switched to anastrozole for subsequent 4 years due to intolerance. - She had a history of right breast cancer in 2009, status post mastectomy.  She did not receive any adjuvant chemotherapy or radiation. - She was started on anastrozole on 07/21/2018, she took it for 2 weeks and felt very emotional.  She stopped taking it. -Left mastectomy on 08/19/2018, 1.1 cm IDC, grade 1, margins negative.  No lymph nodes were identified.,  ER was 100% positive, PR 7% positive and HER-2 3+ positive.  - I have called and talked to Dr. Claudette Laws (pathologist).  He clarified that there was an initial biopsy done on 06/23/2018, at [facility].  This report is not uploaded on epic.  The initial biopsy was reportedly positive for HER-2. -The second biopsy on 06/29/2018 was negative for HER-2.  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 - The second focus was not clearly identified on the mastectomy specimen. - Adjuvant chemotherapy with weekly paclitaxel for 12 cycles and Herceptin for 1 year was recommended. -2D echocardiogram on 10/07/2018  shows EF of 60 to 65%. - Weekly Herceptin and paclitaxel at 40 mg per metered squared started on 10/22/2018. -Week 4 treatment was on 11/12/2018. -She has tolerated her last treatment very well.  She felt very energetic for 1 day after last treatment, likely from dexamethasone.  She denied any numbness or tingling in the extremities.  Denies any GI symptoms. -I reviewed her blood work.  She may proceed with week 5 today. -She will come back in 1 week for follow-up and her next treatment.  2.  Osteoporosis: -She had D[facility]A scan at [facility].  She receives Prolia every 6 months at Dr. Carmie End office. -She will continue calcium and vitamin D twice daily.  3.  Recurrent DVT: -She had a history of DVT in 2000's after 1 week post trip to [place], treated with Coumadin until 5 years ago.  This was stopped secondary to bleed complicated by  her ulcerative colitis. -She had a recurrent right leg femoral and popliteal DVT on 03/08/2018 and currently on Eliquis.  No bleeding reported.  4.  Ulcerative colitis: -She is currently on mesalamine and Humira.  She does have some constipation alternating with diarrhea from her UC. -It has not worsened since the start of Herceptin.        Orders placed this encounter:  No orders of the defined types were placed in this encounter.     Derek Jack, MD [place] [phone]

## 2018-11-19 NOTE — Patient Instructions (Signed)
[facility] at [facility] Discharge Instructions  You were seen today by Dr. Katragadda. He went over your recent lab results. He will see you back in 1 week for labs, treatment and follow up.   Thank you for choosing [facility] at [facility] to provide your oncology and hematology care.  To afford each patient quality time with our provider, please arrive at least 15 minutes before your scheduled appointment time.   If you have a lab appointment with the Cancer Center please come in thru the  Main Entrance and check in at the main information desk  You need to re-schedule your appointment should you arrive 10 or more minutes late.  We strive to give you quality time with our providers, and arriving late affects you and other patients whose appointments are after yours.  Also, if you no show three or more times for appointments you may be dismissed from the clinic at the providers discretion.     Again, thank you for choosing [facility].  Our hope is that these requests will decrease the amount of time that you wait before being seen by our physicians.       _____________________________________________________________  Should you have questions after your visit to [facility], please contact our office at [phone] between the hours of 8:00 a.m. and 4:30 p.m.  Voicemails left after 4:00 p.m. will not be returned until the following business day.  For prescription refill requests, have your pharmacy contact our office and allow 72 hours.    Cancer Center Support Programs:   > Cancer Support Group  2nd Tuesday of the month 1pm-2pm, Journey Room    

## 2018-11-19 NOTE — Progress Notes (Signed)
Patient seen by Dr. Delton Coombes with lab review and ok to treat today.  No s/s of distress noted and no complaints voiced by the patient.

## 2018-11-30 ENCOUNTER — Other Ambulatory Visit: Payer: Self-pay

## 2018-12-01 ENCOUNTER — Encounter (HOSPITAL_COMMUNITY): Payer: Self-pay

## 2018-12-01 ENCOUNTER — Inpatient Hospital Stay (HOSPITAL_COMMUNITY): Payer: Medicare Other

## 2018-12-01 ENCOUNTER — Encounter (HOSPITAL_COMMUNITY): Payer: Self-pay | Admitting: Hematology

## 2018-12-01 ENCOUNTER — Inpatient Hospital Stay (HOSPITAL_BASED_OUTPATIENT_CLINIC_OR_DEPARTMENT_OTHER): Payer: Medicare Other | Admitting: Hematology

## 2018-12-01 VITALS — BP 150/60 | HR 80 | Temp 98.2°F | Resp 18 | Wt 179.0 lb

## 2018-12-01 VITALS — BP 143/72 | HR 72 | Temp 98.6°F | Resp 18

## 2018-12-01 DIAGNOSIS — M810 Age-related osteoporosis without current pathological fracture: Secondary | ICD-10-CM

## 2018-12-01 DIAGNOSIS — C50212 Malignant neoplasm of upper-inner quadrant of left female breast: Secondary | ICD-10-CM

## 2018-12-01 DIAGNOSIS — R197 Diarrhea, unspecified: Secondary | ICD-10-CM

## 2018-12-01 DIAGNOSIS — Z170 Estrogen receptor positive status [ER+]: Secondary | ICD-10-CM

## 2018-12-01 DIAGNOSIS — K5190 Ulcerative colitis, unspecified, without complications: Secondary | ICD-10-CM | POA: Diagnosis not present

## 2018-12-01 DIAGNOSIS — Z7901 Long term (current) use of anticoagulants: Secondary | ICD-10-CM

## 2018-12-01 DIAGNOSIS — Z87891 Personal history of nicotine dependence: Secondary | ICD-10-CM

## 2018-12-01 DIAGNOSIS — K5900 Constipation, unspecified: Secondary | ICD-10-CM

## 2018-12-01 DIAGNOSIS — Z5112 Encounter for antineoplastic immunotherapy: Secondary | ICD-10-CM | POA: Diagnosis not present

## 2018-12-01 DIAGNOSIS — Z86718 Personal history of other venous thrombosis and embolism: Secondary | ICD-10-CM

## 2018-12-01 LAB — COMPREHENSIVE METABOLIC PANEL
ALT: 18 U/L (ref 0–44)
AST: 18 U/L (ref 15–41)
Albumin: 3.6 g/dL (ref 3.5–5.0)
Alkaline Phosphatase: 33 U/L — ABNORMAL LOW (ref 38–126)
Anion gap: 8 (ref 5–15)
BUN: 19 mg/dL (ref 8–23)
CO2: 25 mmol/L (ref 22–32)
Calcium: 8.5 mg/dL — ABNORMAL LOW (ref 8.9–10.3)
Chloride: 104 mmol/L (ref 98–111)
Creatinine, Ser: 1.04 mg/dL — ABNORMAL HIGH (ref 0.44–1.00)
GFR calc Af Amer: 59 mL/min — ABNORMAL LOW (ref >60)
GFR calc non Af Amer: 51 mL/min — ABNORMAL LOW (ref >60)
Glucose, Bld: 105 mg/dL — ABNORMAL HIGH (ref 70–99)
Potassium: 3.9 mmol/L (ref 3.5–5.1)
Sodium: 137 mmol/L (ref 135–145)
Total Bilirubin: 0.6 mg/dL (ref 0.3–1.2)
Total Protein: 6.8 g/dL (ref 6.5–8.1)

## 2018-12-01 LAB — CBC WITH DIFFERENTIAL/PLATELET
Abs Immature Granulocytes: 0.03 10*3/uL (ref 0.00–0.07)
Basophils Absolute: 0 10*3/uL (ref 0.0–0.1)
Basophils Relative: 0 %
Eosinophils Absolute: 0.2 10*3/uL (ref 0.0–0.5)
Eosinophils Relative: 3 %
HCT: 32.4 % — ABNORMAL LOW (ref 36.0–46.0)
Hemoglobin: 10.5 g/dL — ABNORMAL LOW (ref 12.0–15.0)
Immature Granulocytes: 1 %
Lymphocytes Relative: 28 %
Lymphs Abs: 1.7 10*3/uL (ref 0.7–4.0)
MCH: 30.7 pg (ref 26.0–34.0)
MCHC: 32.4 g/dL (ref 30.0–36.0)
MCV: 94.7 fL (ref 80.0–100.0)
Monocytes Absolute: 0.7 10*3/uL (ref 0.1–1.0)
Monocytes Relative: 11 %
Neutro Abs: 3.5 10*3/uL (ref 1.7–7.7)
Neutrophils Relative %: 57 %
Platelets: 196 10*3/uL (ref 150–400)
RBC: 3.42 MIL/uL — ABNORMAL LOW (ref 3.87–5.11)
RDW: 13.9 % (ref 11.5–15.5)
WBC: 6 10*3/uL (ref 4.0–10.5)
nRBC: 0 % (ref 0.0–0.2)

## 2018-12-01 LAB — MAGNESIUM: Magnesium: 2.4 mg/dL (ref 1.7–2.4)

## 2018-12-01 MED ORDER — SODIUM CHLORIDE 0.9 % IV SOLN
40.0000 mg/m2 | Freq: Once | INTRAVENOUS | Status: AC
  Administered 2018-12-01: 72 mg via INTRAVENOUS
  Filled 2018-12-01: qty 12

## 2018-12-01 MED ORDER — SODIUM CHLORIDE 0.9 % IV SOLN
Freq: Once | INTRAVENOUS | Status: AC
  Administered 2018-12-01: 10:00:00 via INTRAVENOUS

## 2018-12-01 MED ORDER — SODIUM CHLORIDE 0.9% FLUSH
10.0000 mL | INTRAVENOUS | Status: DC | PRN
  Administered 2018-12-01: 10 mL
  Filled 2018-12-01: qty 10

## 2018-12-01 MED ORDER — HEPARIN SOD (PORK) LOCK FLUSH 100 UNIT/ML IV SOLN
500.0000 [IU] | Freq: Once | INTRAVENOUS | Status: AC | PRN
  Administered 2018-12-01: 500 [IU]

## 2018-12-01 MED ORDER — ACETAMINOPHEN 325 MG PO TABS
650.0000 mg | ORAL_TABLET | Freq: Once | ORAL | Status: AC
  Administered 2018-12-01: 650 mg via ORAL
  Filled 2018-12-01: qty 2

## 2018-12-01 MED ORDER — SODIUM CHLORIDE 0.9 % IV SOLN
20.0000 mg | Freq: Once | INTRAVENOUS | Status: AC
  Administered 2018-12-01: 20 mg via INTRAVENOUS
  Filled 2018-12-01: qty 20

## 2018-12-01 MED ORDER — DIPHENHYDRAMINE HCL 50 MG/ML IJ SOLN
25.0000 mg | Freq: Once | INTRAMUSCULAR | Status: AC
  Administered 2018-12-01: 25 mg via INTRAVENOUS
  Filled 2018-12-01: qty 1

## 2018-12-01 MED ORDER — FAMOTIDINE IN NACL 20-0.9 MG/50ML-% IV SOLN
20.0000 mg | Freq: Once | INTRAVENOUS | Status: AC
  Administered 2018-12-01: 20 mg via INTRAVENOUS
  Filled 2018-12-01: qty 50

## 2018-12-01 MED ORDER — TRASTUZUMAB CHEMO 150 MG IV SOLR
150.0000 mg | Freq: Once | INTRAVENOUS | Status: AC
  Administered 2018-12-01: 150 mg via INTRAVENOUS
  Filled 2018-12-01: qty 7.14

## 2018-12-01 NOTE — Patient Instructions (Signed)
[facility] at [facility] Discharge Instructions  Labs drawn from portacath today   Thank you for choosing  Cancer Center at [facility] to provide your oncology and hematology care.  To afford each patient quality time with our provider, please arrive at least 15 minutes before your scheduled appointment time.   If you have a lab appointment with the Cancer Center please come in thru the  Main Entrance and check in at the main information desk  You need to re-schedule your appointment should you arrive 10 or more minutes late.  We strive to give you quality time with our providers, and arriving late affects you and other patients whose appointments are after yours.  Also, if you no show three or more times for appointments you may be dismissed from the clinic at the providers discretion.     Again, thank you for choosing [facility].  Our hope is that these requests will decrease the amount of time that you wait before being seen by our physicians.       _____________________________________________________________  Should you have questions after your visit to [facility], please contact our office at [phone] between the hours of 8:00 a.m. and 4:30 p.m.  Voicemails left after 4:00 p.m. will not be returned until the following business day.  For prescription refill requests, have your pharmacy contact our office and allow 72 hours.    Cancer Center Support Programs:   > Cancer Support Group  2nd Tuesday of the month 1pm-2pm, Journey Room   

## 2018-12-01 NOTE — Patient Instructions (Signed)
[facility] Discharge Instructions for Patients Receiving Chemotherapy   Beginning January 23rd 2017 lab work for the [facility] will be done in the  Main lab at [facility] on 1st floor. If you have a lab appointment with the [facility] please come in thru the  Main Entrance and check in at the main information desk   Today you received the following chemotherapy agents Taxol and Herceptin. Follow-up as scheduled. Call clinic for any questions or concerns  To help prevent nausea and vomiting after your treatment, we encourage you to take your nausea medication   If you develop nausea and vomiting, or diarrhea that is not controlled by your medication, call the clinic.  The clinic phone number is [phone]. Office hours are Monday-Friday 8:30am-5:00pm.  BELOW ARE SYMPTOMS THAT SHOULD BE REPORTED IMMEDIATELY:  *FEVER GREATER THAN 101.0 F  *CHILLS WITH OR WITHOUT FEVER  NAUSEA AND VOMITING THAT IS NOT CONTROLLED WITH YOUR NAUSEA MEDICATION  *UNUSUAL SHORTNESS OF BREATH  *UNUSUAL BRUISING OR BLEEDING  TENDERNESS IN MOUTH AND THROAT WITH OR WITHOUT PRESENCE OF ULCERS  *URINARY PROBLEMS  *BOWEL PROBLEMS  UNUSUAL RASH Items with * indicate a potential emergency and should be followed up as soon as possible. If you have an emergency after office hours please contact your primary care physician or go to the nearest emergency department.  Please call the clinic during office hours if you have any questions or concerns.   You may also contact the Patient Navigator at [phone] should you have any questions or need assistance in obtaining follow up care.      Resources For Cancer Patients and their Caregivers ? American Cancer Society: Can assist with transportation, wigs, general needs, runs Look Good Feel Better.        3087840916 ? Cancer Care: Provides financial assistance, online support groups, medication/co-pay assistance.   1-800-813-HOPE 248-706-6405) ? [facility] Assists [place] cancer patients and their families through emotional , educational and financial support.  215-447-0825 ? [place] DSS Where to apply for food stamps, Medicaid and utility assistance. (424)285-9882 ? [facility]: Transportation to medical appointments. 458 672 0266 ? Social Security Administration: May apply for disability if have a Stage IV cancer. (450) 742-4946 (320)857-1803 ? [employer], Disability and Transit Services: Assists with nutrition, care and transit needs. 858-814-5272

## 2018-12-01 NOTE — Progress Notes (Signed)
Monique Day, [place] [id number]   CLINIC:  Medical Oncology/Hematology  PCP:  Monique Graves, DO [address] [place] [address] [phone]   REASON FOR VISIT:  Follow-up for left breast cancer and chemotherapy   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 breast ([place])   07/21/2018 Initial Diagnosis    Breast cancer of upper-inner quadrant of left female breast ([place])    10/22/2018 -  Chemotherapy    The patient had trastuzumab (HERCEPTIN) 300 mg in sodium chloride 0.9 % 250 mL chemo infusion, 315 mg, Intravenous,  Once, 2 of 16 cycles Administration: 300 mg (10/22/2018), 150 mg (10/29/2018), 150 mg (11/19/2018), 150 mg (11/05/2018), 150 mg (11/12/2018), 150 mg (12/01/2018) PACLitaxel (TAXOL) 72 mg in sodium chloride 0.9 % 150 mL chemo infusion (</= 17m/m2), 40 mg/m2 = 72 mg (50 % of original dose 80 mg/m2), Intravenous,  Once, 2 of 3 cycles Dose modification: 40 mg/m2 (50 % of original dose 80 mg/m2, Cycle 1, Reason: Provider Judgment) Administration: 72 mg (10/22/2018), 72 mg (10/29/2018), 72 mg (11/19/2018), 72 mg (11/05/2018), 72 mg (11/12/2018), 72 mg (12/01/2018)  for chemotherapy treatment.       CANCER STAGING: Cancer Staging No matching staging information was found for the patient.   INTERVAL HISTORY:  Ms. WP[age]ell89y.[id number]. female returns for routine follow-up and consideration for next cycle of chemotherapy. She is here today alone. She states that she has been doing good since her last visit. Denies any nausea, vomiting, or diarrhea. Denies any new pains. Had not noticed any recent bleeding such as epistaxis, hematuria or hematochezia. Denies recent chest pain on exertion, shortness of breath on minimal exertion, pre-syncopal episodes, or palpitations.  Denies any recent fevers, infections, or recent hospitalizations. Patient reports appetite at 100% and energy level at 75%.      REVIEW OF SYSTEMS:   Review of Systems  Neurological: Positive for numbness.     PAST MEDICAL/SURGICAL HISTORY:  Past Medical History:  Diagnosis Date  . Adult idiopathic generalized osteoporosis   . Arthritis    "minor" (08/18/2018)  . Breast cancer, left breast ([facility]   lumpectomy  . Breast cancer, right breast ([facility] 2009   lumpectomy; mastectomy  . DVT (deep venous thrombosis) ([facility] "early 2000's"   RLE  . DVT (deep venous thrombosis) ([facility]   . Hemorrhoids, external   . History of blood transfusion    "related to ulcerative colitis"  . Lower abdominal pain   . Occult blood in stools   . Peripheral edema   . Seizures ([facility]    one very slight seizure with the stroke; somewhere between 2005-2009  . Stroke ([facility] 2005-2009   "very light;" ; denies residual on 08/19/2018  . UC (ulcerative colitis) ([place] dx[place] 10272  Past Surgical History:  Procedure Laterality Date  . APPENDECTOMY    . bone density  12/11/10  . BREAST BIOPSY Left 1998; 2019 X 2  . BREAST BIOPSY Right 2009  . BREAST LUMPECTOMY Left 1998  . CATARACT EXTRACTION W/ INTRAOCULAR LENS  IMPLANT, BILATERAL Bilateral   . COLONOSCOPY  02/21/2011  . COLONOSCOPY  10/06/2008  . COLONOSCOPY  12/27/01  . COLONOSCOPY  05/09/05  . COLONOSCOPY N/A 07/02/2016   Procedure: COLONOSCOPY;  Surgeon: [name] MD;  Location: [facility];  Service: Endoscopy;  Laterality: N/A;  1055  . [place]  "I was  having rectal bleeding"  . FRACTURE SURGERY    . MASTECTOMY Right 2009  . MASTECTOMY COMPLETE / SIMPLE Left 08/19/2018  . PORTACATH PLACEMENT Right 10/15/2018   Procedure: INSERTION PORT-A-CATH RIGHT SUBCLAVIN;  Surgeon: [name], MD;  Location: [facility] ORS;  Service: General;  Laterality: Right;  pt knows to arrive at 7:30  . SIMPLE MASTECTOMY WITH AXILLARY SENTINEL NODE BIOPSY Left 08/19/2018   Procedure: LEFT SIMPLE MASTECTOMY;  Surgeon: [name], MD;  Location: [facility];  Service: General;  Laterality: Left;  .  TONSILLECTOMY    . VENA CAVA FILTER PLACEMENT Right   . WRIST FRACTURE SURGERY Right      SOCIAL HISTORY:  Social History   Socioeconomic History  . Marital status: Widowed    Spouse name: Not on file  . Number of children: 0  . Years of education: Not on file  . Highest education level: Not on file  Occupational History  . Occupation: Optometrist: [employer]  Social Needs  . Financial resource strain: Not hard at all  . Food insecurity:    Worry: Never true    Inability: Never true  . Transportation needs:    Medical: No    Non-medical: No  Tobacco Use  . Smoking status: Former Smoker    Packs/day: 2.00    Years: 40.00    Pack years: 80.00    Types: Cigarettes    Last attempt to quit: 05/05/2001    Years since quitting: 17.5  . Smokeless tobacco: Never Used  Substance and Sexual Activity  . Alcohol use: Yes    Comment: 08/18/2018 "couple drinks/year"  . Drug use: Never  . Sexual activity: Not Currently  Lifestyle  . Physical activity:    Days per week: 0 days    Minutes per session: 0 min  . Stress: Not at all  Relationships  . Social connections:    Talks on phone: More than three times a week    Gets together: More than three times a week    Attends religious service: More than 4 times per year    Active member of club or organization: Yes    Attends meetings of clubs or organizations: More than 4 times per year    Relationship status: Widowed  . Intimate partner violence:    Fear of current or ex partner: No    Emotionally abused: No    Physically abused: No    Forced sexual activity: No  Other Topics Concern  . Not on file  Social History Narrative  . Not on file    FAMILY HISTORY:  Family History  Problem Relation Age of Onset  . Prostate cancer Father   . Colon cancer Neg Hx     CURRENT MEDICATIONS:  Outpatient Encounter Medications as of 12/01/2018  Medication Sig Note  . acetaminophen (TYLENOL) 500 MG  tablet Take 500 mg by mouth every 6 (six) hours as needed for moderate pain or headache.    Marland Kitchen apixaban (ELIQUIS) 5 MG TABS tablet Take 2 tablets (10 mg total) by mouth 2 (two) times daily. Then 1 tablet (5 mg) two times daily starting 03/16/18 (Patient taking differently: Take 2.5 mg by mouth 2 (two) times daily. )   . Ascorbic Acid (VITAMIN C) 1000 MG tablet Take 1,000 mg by mouth daily.   . Calcium Carb-Cholecalciferol (CALCIUM 500 + D3) 500-600 MG-UNIT TABS Take 1 tablet by mouth daily.    Marland Kitchen denosumab (PROLIA) 60 MG/ML  SOLN injection Inject 60 mg into the skin every 6 (six) months. Administer in upper arm, thigh, or abdomen 10/07/2018: LAST DOSE:09/2018  . HUMIRA PEN 40 MG/0.8ML PNKT INJECT 40 MG SUBQ EVERY 14 DAYS. (Patient taking differently: Inject 40 mg as directed every 14 (fourteen) days. SATURDAYS.)   . lidocaine-prilocaine (EMLA) cream Apply to port site 1 hour prior to appointment 10/07/2018: NEW MEDICATION (PATIENT HAS NOT CHEMOTHERAPY TO DATE)  . Magnesium 500 MG TABS Take 500 mg by mouth daily.   . mesalamine (APRISO) 0.375 g 24 hr capsule Take 4 capsules by mouth each morning. (Patient not taking: Reported on 11/19/2018)   . Naphazoline-Pheniramine (OPCON-A) 0.027-0.315 % SOLN Place 1 drop into both eyes 3 (three) times daily as needed (dry eyes).  09/21/2018: As needed , rare per patient  . PACLitaxel (TAXOL IV) Inject into the vein once a week. Weekly x 12 cycles starting 10/22/2018 10/07/2018: NEW MEDICATION (PATIENT HAS NOT CHEMOTHERAPY TO DATE)   . polyethylene glycol powder (GLYCOLAX/MIRALAX) powder Take 8.5 g by mouth daily as needed. (Patient taking differently: Take 8.5 g by mouth daily as needed (constipation.). )   . PRESCRIPTION MEDICATION Apply 1 application topically 2 (two) times daily as needed (eczema). Ketoconazole-fluticasone 1:1 compounded cream  08/03/2018: Compounded at [facility]  . prochlorperazine (COMPAZINE) 10 MG tablet Take 1 tablet (10 mg total) by mouth every 6 (six)  hours as needed (Nausea or vomiting). 10/07/2018: NEW MEDICATION (PATIENT HAS NOT CHEMOTHERAPY TO DATE)   . traMADol (ULTRAM) 50 MG tablet Take 1 tablet (50 mg total) by mouth every 6 (six) hours as needed.   . Trastuzumab (HERCEPTIN IV) Inject into the vein every 21 ( twenty-one) days. Every 3 weeks x 1 year starting 10/22/2018 10/07/2018: NEW THERAPY PATIENT HAS NOT STARTED YET   No facility-administered encounter medications on file as of 12/01/2018.     ALLERGIES:  Allergies  Allergen Reactions  . Mercaptopurine Other (See Comments)    Dropped WBC  . Sulfa Antibiotics Other (See Comments)    Extreme Weakness     PHYSICAL EXAM:  ECOG Performance status: 1  Vitals:   12/01/18 0844  BP: (!) 150/60  Pulse: 80  Resp: 18  Temp: 98.2 F (36.8 C)  SpO2: 98%   Filed Weights   12/01/18 0844  Weight: 179 lb (81.2 kg)    Physical Exam Vitals signs reviewed.  Constitutional:      Appearance: Normal appearance.  Cardiovascular:     Rate and Rhythm: Normal rate and regular rhythm.     Heart sounds: Normal heart sounds.  Pulmonary:     Effort: Pulmonary effort is normal.     Breath sounds: Normal breath sounds.  Abdominal:     General: There is no distension.     Palpations: Abdomen is soft. There is no mass.  Musculoskeletal:        General: No swelling.  Skin:    General: Skin is warm.  Neurological:     General: No focal deficit present.     Mental Status: She is alert and oriented to person, place, and time.  Psychiatric:        Mood and Affect: Mood normal.        Behavior: Behavior normal.      LABORATORY DATA:  I have reviewed the labs as listed.  CBC    Component Value Date/Time   WBC 6.0 12/01/2018 0834   RBC 3.42 (L) 12/01/2018 0834   HGB 10.5 (L) 12/01/2018 2482  HGB 14.4 03/10/2017 1254   HCT 32.4 (L) 12/01/2018 0834   HCT 41.5 03/10/2017 1254   PLT 196 12/01/2018 0834   PLT 249 03/10/2017 1254   MCV 94.7 12/01/2018 0834   MCV 91 03/10/2017 1254    MCH 30.7 12/01/2018 0834   MCHC 32.4 12/01/2018 0834   RDW 13.9 12/01/2018 0834   RDW 13.3 03/10/2017 1254   LYMPHSABS 1.7 12/01/2018 0834   LYMPHSABS 2.7 03/10/2017 1254   MONOABS 0.7 12/01/2018 0834   EOSABS 0.2 12/01/2018 0834   EOSABS 0.4 03/10/2017 1254   BASOSABS 0.0 12/01/2018 0834   BASOSABS 0.1 03/10/2017 1254   CMP Latest Ref Rng & Units 12/01/2018 11/19/2018 11/12/2018  Glucose 70 - 99 mg/dL 105(H) 105(H) 107(H)  BUN 8 - 23 mg/dL 19 22 24(H)  Creatinine 0.44 - 1.00 mg/dL 1.04(H) 0.89 0.97  Sodium 135 - 145 mmol/L 137 138 137  Potassium 3.5 - 5.1 mmol/L 3.9 4.2 4.0  Chloride 98 - 111 mmol/L 104 106 105  CO2 22 - 32 mmol/L 25 25 25   Calcium 8.9 - 10.3 mg/dL 8.5(L) 8.5(L) 9.2  Total Protein 6.5 - 8.1 g/dL 6.8 6.7 6.9  Total Bilirubin 0.3 - 1.2 mg/dL 0.6 0.6 0.5  Alkaline Phos 38 - 126 U/L 33(L) 29(L) 30(L)  AST 15 - 41 U/L 18 18 18   ALT 0 - 44 U/L 18 19 18        DIAGNOSTIC IMAGING:  I have independently reviewed the scans and discussed with the patient.   I have reviewed Monique Lick LPN's note and agree with the documentation.  I personally performed a face-to-face visit, made revisions and my assessment and plan is as follows.    ASSESSMENT & PLAN:   Breast cancer of upper-inner quadrant of left female breast ([place]) 1.  Stage I (PT1CNX) HER-2 positive left breast cancer: - [facility], followed by ultrasound-guided biopsy on 06/23/2018.  IDC, HER-2 positive, ER/PR positive, Ki 67 2%. - Stereotactic biopsy in [place] on 06/29/2018, of the left breast irregular mass within the posterior upper, slightly inner quadrant.  A coil-shaped clip was placed. 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  Ki 67 was 10%. - Patient had a history of left breast cancer in 1998, status post lumpectomy, followed by chemotherapy and radiation, 1 year of tamoxifen which was switched to anastrozole for subsequent 4  years due to intolerance. - She had a history of right breast cancer in 2009, status post mastectomy.  She did not receive any adjuvant chemotherapy or radiation. - She was started on anastrozole on 07/21/2018, she took it for 2 weeks and felt very emotional.  She stopped taking it. -Left mastectomy on 08/19/2018, 1.1 cm IDC, grade 1, margins negative.  No lymph nodes were identified.,  ER was 100% positive, PR 7% positive and HER-2 3+ positive.  - I have called and talked to Dr. Claudette Laws (pathologist).  He clarified that there was an initial biopsy done on 06/23/2018, at [facility].  This report is not uploaded on epic.  The initial biopsy was reportedly positive for HER-2. -The second biopsy on 06/29/2018 was negative for HER-2.  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 - The second focus was not clearly identified on the mastectomy specimen. - Adjuvant chemotherapy with weekly paclitaxel for 12 cycles and Herceptin for 1 year was recommended. -2D echocardiogram on 10/07/2018 shows EF of  60 to 65%. - Weekly Herceptin and paclitaxel at 40 mg/m started on 10/22/2018. Week 5 treatment on 11/19/2018. -She is tolerating chemotherapy and Herceptin very well.  She denies any GI symptoms including diarrhea.  She usually feels energetic 1 day after each treatment. -She denies any tingling or numbness in extremities. -I have reviewed her blood work today.  She may proceed with week 6 at the same dose level. -Because of her advanced age, I will monitor her closely during therapy.  2.  Osteoporosis: -She had DEXA scan at [facility].  She receives Prolia every 6 months at Dr. Carmie End office. She will continue calcium and vitamin D twice daily.  3.  Recurrent DVT: -She had a history of DVT in 2000's after 1 week post trip to [place], treated with Coumadin until 5 years ago.  This was stopped secondary to bleed complicated by her ulcerative colitis. -She  had a recurrent right leg femoral and popliteal DVT on 03/08/2018 and currently on Eliquis.  No bleeding reported.   4.  Ulcerative colitis: -She is currently on mesalamine and Humira.  She does have some constipation alternating with diarrhea from her UC. -It has not worsened since the start of Herceptin.        Orders placed this encounter:  No orders of the defined types were placed in this encounter.     Derek Jack, MD [place] [phone]

## 2018-12-01 NOTE — Assessment & Plan Note (Signed)
1.  Stage I (PT1CNX) HER-2 positive left breast cancer: - Abnormal mammogram at [facility], followed by ultrasound-guided biopsy on 06/23/2018.  IDC, HER-2 positive, ER/PR positive, Ki 67 2%. - Stereotactic biopsy in [place] on 06/29/2018, of the left breast irregular mass within the posterior upper, slightly inner quadrant.  A coil-shaped clip was placed. 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  Ki 67 was 10%. - Patient had a history of left breast cancer in 1998, status post lumpectomy, followed by chemotherapy and radiation, 1 year of tamoxifen which was switched to anastrozole for subsequent 4 years due to intolerance. - She had a history of right breast cancer in 2009, status post mastectomy.  She did not receive any adjuvant chemotherapy or radiation. - She was started on anastrozole on 07/21/2018, she took it for 2 weeks and felt very emotional.  She stopped taking it. -Left mastectomy on 08/19/2018, 1.1 cm IDC, grade 1, margins negative.  No lymph nodes were identified.,  ER was 100% positive, PR 7% positive and HER-2 3+ positive.  - I have called and talked to Dr. John Patrick (pathologist).  He clarified that there was an initial biopsy done on 06/23/2018, at [facility].  This report is not uploaded on epic.  The initial biopsy was reportedly positive for HER-2. -The second biopsy on 06/29/2018 was negative for HER-2.  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 - The second focus was not clearly identified on the mastectomy specimen. - Adjuvant chemotherapy with weekly paclitaxel for 12 cycles and Herceptin for 1 year was recommended. -2D echocardiogram on 10/07/2018 shows EF of 60 to 65%. - Weekly Herceptin and paclitaxel at 40 mg/m started on 10/22/2018. Week 5 treatment on 11/19/2018. -She is tolerating chemotherapy and Herceptin very well.  She denies any GI symptoms including diarrhea.   She usually feels energetic 1 day after each treatment. -She denies any tingling or numbness in extremities. -I have reviewed her blood work today.  She may proceed with week 6 at the same dose level. -Because of her advanced age, I will monitor her closely during therapy.  2.  Osteoporosis: -She had DEXA scan at [facility].  She receives Prolia every 6 months at Dr. Butler's office. She will continue calcium and vitamin D twice daily.  3.  Recurrent DVT: -She had a history of DVT in 2000's after 1 week post trip to [place], treated with Coumadin until 5 years ago.  This was stopped secondary to bleed complicated by her ulcerative colitis. -She had a recurrent right leg femoral and popliteal DVT on 03/08/2018 and currently on Eliquis.  No bleeding reported.   4.  Ulcerative colitis: -She is currently on mesalamine and Humira.  She does have some constipation alternating with diarrhea from her UC. -It has not worsened since the start of Herceptin.   

## 2018-12-01 NOTE — Patient Instructions (Addendum)
[facility] Discharge Instructions  You were seen today by Dr. Katragadda. He went over your recent lab results. He will see you back in 1 week for labs and follow up.   Thank you for choosing  Cancer Center at [facility] to provide your oncology and hematology care.  To afford each patient quality time with our provider, please arrive at least 15 minutes before your scheduled appointment time.   If you have a lab appointment with the Cancer Center please come in thru the  Main Entrance and check in at the main information desk  You need to re-schedule your appointment should you arrive 10 or more minutes late.  We strive to give you quality time with our providers, and arriving late affects you and other patients whose appointments are after yours.  Also, if you no show three or more times for appointments you may be dismissed from the clinic at the providers discretion.     Again, thank you for choosing [facility].  Our hope is that these requests will decrease the amount of time that you wait before being seen by our physicians.       _____________________________________________________________  Should you have questions after your visit to [facility], please contact our office at [phone] between the hours of 8:00 a.m. and 4:30 p.m.  Voicemails left after 4:00 p.m. will not be returned until the following business day.  For prescription refill requests, have your pharmacy contact our office and allow 72 hours.    Cancer Center Support Programs:   > Cancer Support Group  2nd Tuesday of the month 1pm-2pm, Journey Room    

## 2018-12-01 NOTE — Progress Notes (Signed)
[id number] Labs results reviewed with and pt seen by Dr. Delton Coombes and pt approved for chemo tx today per MD                                                                                     Monique Day tolerated chemo tx well without complaints or incident. VSS upon discharge. Pt discharged self ambulatory in satisfactory condition

## 2018-12-10 ENCOUNTER — Other Ambulatory Visit: Payer: Self-pay

## 2018-12-10 ENCOUNTER — Inpatient Hospital Stay (HOSPITAL_COMMUNITY): Payer: Medicare Other

## 2018-12-10 ENCOUNTER — Encounter (HOSPITAL_COMMUNITY): Payer: Self-pay | Admitting: Hematology

## 2018-12-10 ENCOUNTER — Inpatient Hospital Stay (HOSPITAL_BASED_OUTPATIENT_CLINIC_OR_DEPARTMENT_OTHER): Payer: Medicare Other | Admitting: Hematology

## 2018-12-10 VITALS — BP 149/68 | HR 71 | Temp 98.1°F | Resp 18 | Wt 175.8 lb

## 2018-12-10 DIAGNOSIS — Z86718 Personal history of other venous thrombosis and embolism: Secondary | ICD-10-CM

## 2018-12-10 DIAGNOSIS — Z7901 Long term (current) use of anticoagulants: Secondary | ICD-10-CM

## 2018-12-10 DIAGNOSIS — C50212 Malignant neoplasm of upper-inner quadrant of left female breast: Secondary | ICD-10-CM | POA: Diagnosis not present

## 2018-12-10 DIAGNOSIS — Z170 Estrogen receptor positive status [ER+]: Secondary | ICD-10-CM | POA: Diagnosis not present

## 2018-12-10 DIAGNOSIS — M810 Age-related osteoporosis without current pathological fracture: Secondary | ICD-10-CM | POA: Diagnosis not present

## 2018-12-10 DIAGNOSIS — Z5112 Encounter for antineoplastic immunotherapy: Secondary | ICD-10-CM | POA: Diagnosis not present

## 2018-12-10 DIAGNOSIS — K5190 Ulcerative colitis, unspecified, without complications: Secondary | ICD-10-CM | POA: Diagnosis not present

## 2018-12-10 DIAGNOSIS — R197 Diarrhea, unspecified: Secondary | ICD-10-CM

## 2018-12-10 DIAGNOSIS — K5900 Constipation, unspecified: Secondary | ICD-10-CM

## 2018-12-10 DIAGNOSIS — Z87891 Personal history of nicotine dependence: Secondary | ICD-10-CM

## 2018-12-10 LAB — CBC WITH DIFFERENTIAL/PLATELET
Abs Immature Granulocytes: 0.05 10*3/uL (ref 0.00–0.07)
Basophils Absolute: 0 10*3/uL (ref 0.0–0.1)
Basophils Relative: 0 %
Eosinophils Absolute: 0.2 10*3/uL (ref 0.0–0.5)
Eosinophils Relative: 3 %
HCT: 36.8 % (ref 36.0–46.0)
Hemoglobin: 12 g/dL (ref 12.0–15.0)
Immature Granulocytes: 1 %
Lymphocytes Relative: 27 %
Lymphs Abs: 1.9 10*3/uL (ref 0.7–4.0)
MCH: 30.7 pg (ref 26.0–34.0)
MCHC: 32.6 g/dL (ref 30.0–36.0)
MCV: 94.1 fL (ref 80.0–100.0)
Monocytes Absolute: 1.1 10*3/uL — ABNORMAL HIGH (ref 0.1–1.0)
Monocytes Relative: 15 %
Neutro Abs: 4 10*3/uL (ref 1.7–7.7)
Neutrophils Relative %: 54 %
Platelets: 231 10*3/uL (ref 150–400)
RBC: 3.91 MIL/uL (ref 3.87–5.11)
RDW: 14.4 % (ref 11.5–15.5)
WBC: 7.3 10*3/uL (ref 4.0–10.5)
nRBC: 0 % (ref 0.0–0.2)

## 2018-12-10 LAB — COMPREHENSIVE METABOLIC PANEL
ALT: 19 U/L (ref 0–44)
AST: 18 U/L (ref 15–41)
Albumin: 3.5 g/dL (ref 3.5–5.0)
Alkaline Phosphatase: 37 U/L — ABNORMAL LOW (ref 38–126)
Anion gap: 7 (ref 5–15)
BUN: 15 mg/dL (ref 8–23)
CO2: 25 mmol/L (ref 22–32)
Calcium: 8.7 mg/dL — ABNORMAL LOW (ref 8.9–10.3)
Chloride: 105 mmol/L (ref 98–111)
Creatinine, Ser: 0.95 mg/dL (ref 0.44–1.00)
GFR calc Af Amer: >60 mL/min (ref >60)
GFR calc non Af Amer: 57 mL/min — ABNORMAL LOW (ref >60)
Glucose, Bld: 111 mg/dL — ABNORMAL HIGH (ref 70–99)
Potassium: 3.7 mmol/L (ref 3.5–5.1)
Sodium: 137 mmol/L (ref 135–145)
Total Bilirubin: 0.5 mg/dL (ref 0.3–1.2)
Total Protein: 6.5 g/dL (ref 6.5–8.1)

## 2018-12-10 LAB — MAGNESIUM: Magnesium: 2.1 mg/dL (ref 1.7–2.4)

## 2018-12-10 MED ORDER — DIPHENHYDRAMINE HCL 50 MG/ML IJ SOLN
25.0000 mg | Freq: Once | INTRAMUSCULAR | Status: AC
  Administered 2018-12-10: 25 mg via INTRAVENOUS
  Filled 2018-12-10: qty 1

## 2018-12-10 MED ORDER — SODIUM CHLORIDE 0.9 % IV SOLN
20.0000 mg | Freq: Once | INTRAVENOUS | Status: AC
  Administered 2018-12-10: 20 mg via INTRAVENOUS
  Filled 2018-12-10: qty 20

## 2018-12-10 MED ORDER — FAMOTIDINE IN NACL 20-0.9 MG/50ML-% IV SOLN
20.0000 mg | Freq: Once | INTRAVENOUS | Status: AC
  Administered 2018-12-10: 09:00:00 20 mg via INTRAVENOUS
  Filled 2018-12-10: qty 50

## 2018-12-10 MED ORDER — ACETAMINOPHEN 325 MG PO TABS
650.0000 mg | ORAL_TABLET | Freq: Once | ORAL | Status: AC
  Administered 2018-12-10: 650 mg via ORAL
  Filled 2018-12-10: qty 2

## 2018-12-10 MED ORDER — SODIUM CHLORIDE 0.9% FLUSH
10.0000 mL | INTRAVENOUS | Status: DC | PRN
  Administered 2018-12-10: 08:00:00 10 mL
  Filled 2018-12-10: qty 10

## 2018-12-10 MED ORDER — SODIUM CHLORIDE 0.9 % IV SOLN
40.0000 mg/m2 | Freq: Once | INTRAVENOUS | Status: AC
  Administered 2018-12-10: 72 mg via INTRAVENOUS
  Filled 2018-12-10: qty 12

## 2018-12-10 MED ORDER — HEPARIN SOD (PORK) LOCK FLUSH 100 UNIT/ML IV SOLN
500.0000 [IU] | Freq: Once | INTRAVENOUS | Status: AC | PRN
  Administered 2018-12-10: 500 [IU]

## 2018-12-10 MED ORDER — TRASTUZUMAB CHEMO 150 MG IV SOLR
150.0000 mg | Freq: Once | INTRAVENOUS | Status: AC
  Administered 2018-12-10: 150 mg via INTRAVENOUS
  Filled 2018-12-10: qty 7.14

## 2018-12-10 MED ORDER — SODIUM CHLORIDE 0.9 % IV SOLN
Freq: Once | INTRAVENOUS | Status: AC
  Administered 2018-12-10: 09:00:00 via INTRAVENOUS

## 2018-12-10 NOTE — Patient Instructions (Addendum)
[facility] at [facility] Discharge Instructions  You were seen today by Dr. Katragadda. He went over your recent lab results. He will see you back in 1 week for labs and follow up.   Thank you for choosing [facility] at [facility] to provide your oncology and hematology care.  To afford each patient quality time with our provider, please arrive at least 15 minutes before your scheduled appointment time.   If you have a lab appointment with the Cancer Center please come in thru the  Main Entrance and check in at the main information desk  You need to re-schedule your appointment should you arrive 10 or more minutes late.  We strive to give you quality time with our providers, and arriving late affects you and other patients whose appointments are after yours.  Also, if you no show three or more times for appointments you may be dismissed from the clinic at the providers discretion.     Again, thank you for choosing [facility].  Our hope is that these requests will decrease the amount of time that you wait before being seen by our physicians.       _____________________________________________________________  Should you have questions after your visit to [facility], please contact our office at [phone] between the hours of 8:00 a.m. and 4:30 p.m.  Voicemails left after 4:00 p.m. will not be returned until the following business day.  For prescription refill requests, have your pharmacy contact our office and allow 72 hours.    Cancer Center Support Programs:   > Cancer Support Group  2nd Tuesday of the month 1pm-2pm, Journey Room    

## 2018-12-10 NOTE — Patient Instructions (Signed)
[facility] Discharge Instructions for Patients Receiving Chemotherapy   Beginning January 23rd 2017 lab work for the [facility] will be done in the  Main lab at [facility] on 1st floor. If you have a lab appointment with the [facility] please come in thru the  Main Entrance and check in at the main information desk   Today you received the following chemotherapy agents Taxol and Herceptin. Follow-up as scheduled. Call clinic for any questions or concerns  To help prevent nausea and vomiting after your treatment, we encourage you to take your nausea medication   If you develop nausea and vomiting, or diarrhea that is not controlled by your medication, call the clinic.  The clinic phone number is [phone]. Office hours are Monday-Friday 8:30am-5:00pm.  BELOW ARE SYMPTOMS THAT SHOULD BE REPORTED IMMEDIATELY:  *FEVER GREATER THAN 101.0 F  *CHILLS WITH OR WITHOUT FEVER  NAUSEA AND VOMITING THAT IS NOT CONTROLLED WITH YOUR NAUSEA MEDICATION  *UNUSUAL SHORTNESS OF BREATH  *UNUSUAL BRUISING OR BLEEDING  TENDERNESS IN MOUTH AND THROAT WITH OR WITHOUT PRESENCE OF ULCERS  *URINARY PROBLEMS  *BOWEL PROBLEMS  UNUSUAL RASH Items with * indicate a potential emergency and should be followed up as soon as possible. If you have an emergency after office hours please contact your primary care physician or go to the nearest emergency department.  Please call the clinic during office hours if you have any questions or concerns.   You may also contact the Patient Navigator at [phone] should you have any questions or need assistance in obtaining follow up care.      Resources For Cancer Patients and their Caregivers ? American Cancer Society: Can assist with transportation, wigs, general needs, runs Look Good Feel Better.        ([phone] ? Cancer Care: Provides financial assistance, online support groups, medication/co-pay assistance.   1-800-813-HOPE 251 267 8494) ? [place] Assists [place] cancer patients and their families through emotional , educational and financial support.  423-674-0739 ? [place] DSS Where to apply for food stamps, Medicaid and utility assistance. 806 294 2465 ? [facility]: Transportation to medical appointments. 438-013-7242 ? Social Security Administration: May apply for disability if have a Stage IV cancer. (705) 493-8310 3162487108 ? [employer], Disability and Transit Services: Assists with nutrition, care and transit needs. 236-878-7061

## 2018-12-10 NOTE — Assessment & Plan Note (Signed)
1.  Stage I (PT1CNX) HER-2 positive left breast cancer: - Abnormal mammogram at [facility], followed by ultrasound-guided biopsy on 06/23/2018.  IDC, HER-2 positive, ER/PR positive, Ki 67 2%. - Stereotactic biopsy in [place] on 06/29/2018, of the left breast irregular mass within the posterior upper, slightly inner quadrant.  A coil-shaped clip was placed. 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  Ki 67 was 10%. - Patient had a history of left breast cancer in 1998, status post lumpectomy, followed by chemotherapy and radiation, 1 year of tamoxifen which was switched to anastrozole for subsequent 4 years due to intolerance. - She had a history of right breast cancer in 2009, status post mastectomy.  She did not receive any adjuvant chemotherapy or radiation. - She was started on anastrozole on 07/21/2018, she took it for 2 weeks and felt very emotional.  She stopped taking it. -Left mastectomy on 08/19/2018, 1.1 cm IDC, grade 1, margins negative.  No lymph nodes were identified.,  ER was 100% positive, PR 7% positive and HER-2 3+ positive.  - I have called and talked to Dr. Claudette Laws (pathologist).  He clarified that there was an initial biopsy done on 06/23/2018, at [facility].  This report is not uploaded on epic.  The initial biopsy was reportedly positive for HER-2. -The second biopsy on 06/29/2018 was negative for HER-2.  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 - The second focus was not clearly identified on the mastectomy specimen. - Adjuvant chemotherapy with weekly paclitaxel for 12 cycles and Herceptin for 1 year was recommended. -2D echocardiogram on 10/07/2018 shows EF of 60 to 65%. - Weekly Herceptin and paclitaxel at 40 mg per metered squared started on 10/22/2018. -Week 6 treatment on 12/01/2018. -She denied any GI side effects.  However she felt very weak on Saturday and was  limited to bed. -Her energy levels have improved at this time.  We reviewed her blood work.  She may proceed with her next cycle today. -We will see her back in 1 week for follow-up with repeat labs and treatment.   2.  Osteoporosis: -She had DEXA scan at [facility].  She receives Prolia every 6 months at Dr. Carmie End office. She will continue calcium and vitamin D twice daily.  3.  Recurrent DVT: -She had a history of DVT in 2000's after 1 week post trip to [place], treated with Coumadin until 5 years ago.  This was stopped secondary to bleed complicated by her ulcerative colitis. -She had a recurrent right leg femoral and popliteal DVT on 03/08/2018 and currently on Eliquis.  No bleeding reported.   4.  Ulcerative colitis: -She is currently on mesalamine and Humira.  She does have some constipation alternating with diarrhea from her UC. -It has not worsened since the start of Herceptin.

## 2018-12-10 NOTE — Progress Notes (Signed)
0900 Labs reviewed with and pt seen by Dr. Delton Coombes and pt approved for chemo tx today per MD                                                                                     Alfredia Client tolerated chemo tx well without complaints or incident. VSS upon discharge. Pt discharged self ambulatory in satisfactory condition

## 2018-12-10 NOTE — Progress Notes (Signed)
[place], [place] [address]   CLINIC:  Medical Oncology/Hematology  PCP:  Octavio Graves, DO [address] [place] [address] [phone]   REASON FOR VISIT:  Follow-up for left breast cancer and chemotherapy.   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 breast ([place])   07/21/2018 Initial Diagnosis    Breast cancer of upper-inner quadrant of left female breast ([place])    10/22/2018 -  Chemotherapy    The patient had trastuzumab (HERCEPTIN) 300 mg in sodium chloride 0.9 % 250 mL chemo infusion, 315 mg, Intravenous,  Once, 2 of 16 cycles Administration: 300 mg (10/22/2018), 150 mg (10/29/2018), 150 mg (11/19/2018), 150 mg (11/05/2018), 150 mg (11/12/2018), 150 mg (12/01/2018), 150 mg (12/10/2018) PACLitaxel (TAXOL) 72 mg in sodium chloride 0.9 % 150 mL chemo infusion (</= 35m/m2), 40 mg/m2 = 72 mg (50 % of original dose 80 mg/m2), Intravenous,  Once, 2 of 3 cycles Dose modification: 40 mg/m2 (50 % of original dose 80 mg/m2, Cycle 1, Reason: Provider Judgment) Administration: 72 mg (10/22/2018), 72 mg (10/29/2018), 72 mg (11/19/2018), 72 mg (11/05/2018), 72 mg (11/12/2018), 72 mg (12/01/2018), 72 mg (12/10/2018)  for chemotherapy treatment.       CANCER STAGING: Cancer Staging No matching staging information was found for the patient.   INTERVAL HISTORY:  Ms. [age]Cleaver89y.o. female returns for routine follow-up and consideration for next cycle of chemotherapy. She is here today alone. She states that she. Denies any nausea, vomiting, or diarrhea. Denies any new pains. Had not noticed any recent bleeding such as epistaxis, hematuria or hematochezia. Denies recent chest pain on exertion, shortness of breath on minimal exertion, pre-syncopal episodes, or palpitations. Denies any numbness or tingling in hands or feet. Denies any recent fevers, infections, or recent hospitalizations. Patient reports appetite at 100% and  energy level at 75%.    REVIEW OF SYSTEMS:  Review of Systems  Constitutional: Positive for fatigue.  All other systems reviewed and are negative.    PAST MEDICAL/SURGICAL HISTORY:  Past Medical History:  Diagnosis Date  . Adult idiopathic generalized osteoporosis   . Arthritis    "minor" (08/18/2018)  . Breast cancer, left breast ([place]   lumpectomy  . Breast cancer, right breast ([facility] 2009   lumpectomy; mastectomy  . DVT (deep venous thrombosis) ([facility] "early 2000's"   RLE  . D[place] (deep venous thrombosis) ([facility] [place]   . Hemorrhoids, external   . History of blood transfusion    "related to ulcerative colitis"  . Lower abdominal pain   . Occult blood in stools   . Peripheral edema   . Seizures ([facility]    one very slight seizure with the stroke; somewhere between 2005-2009  . Stroke ([facility] 2005-2009   "very light;" ; denies residual on 08/19/2018  . UC (ulcerative colitis) ([place] [address]99  Past Surgical History:  Procedure Laterality Date  . APPENDECTOMY    . bone density  12/11/10  . BREAST BIOPSY Left 1998; 2019 X 2  . BREAST BIOPSY Right 2009  . BREAST LUMPECTOMY Left 1998  . CATARACT EXTRACTION W/ INTRAOCULAR LENS  IMPLANT, BILATERAL Bilateral   . COLONOSCOPY  02/21/2011  . COLONOSCOPY  10/06/2008  . COLONOSCOPY  12/27/01  . COLONOSCOPY  05/09/05  . COLONOSCOPY N/A 07/02/2016   Procedure: COLONOSCOPY;  Surgeon: [name] MD;  Location: [facility] ENDO SUITE;  Service: Endoscopy;  Laterality: N/A;  [place]   "I was having rectal bleeding"  . FRACTURE SURGERY    . MASTECTOMY Right 2009  . MASTECTOMY COMPLETE / SIMPLE Left 08/19/2018  . PORTACATH PLACEMENT Right 10/15/2018   Procedure: INSERTION PORT-A-CATH RIGHT SUBCLAVIN;  Surgeon: [name], MD;  Location: [facility] ORS;  Service: General;  Laterality: Right;  pt knows to arrive at 7:30  . SIMPLE MASTECTOMY WITH AXILLARY SENTINEL NODE BIOPSY Left 08/19/2018   Procedure: LEFT SIMPLE MASTECTOMY;   Surgeon: [name], MD;  Location: [facility];  Service: General;  Laterality: Left;  . TONSILLECTOMY    . VENA CAVA FILTER PLACEMENT Right   . WRIST FRACTURE SURGERY Right      SOCIAL HISTORY:  Social History   Socioeconomic History  . Marital status: Widowed    Spouse name: Not on file  . Number of children: 0  . Years of education: Not on file  . Highest education level: Not on file  Occupational History  . Occupation: Optometrist: [employer]  Social Needs  . Financial resource strain: Not hard at all  . Food insecurity:    Worry: Never true    Inability: Never true  . Transportation needs:    Medical: No    Non-medical: No  Tobacco Use  . Smoking status: Former Smoker    Packs/day: 2.00    Years: 40.00    Pack years: 80.00    Types: Cigarettes    Last attempt to quit: 05/05/2001    Years since quitting: 17.6  . Smokeless tobacco: Never Used  Substance and Sexual Activity  . Alcohol use: Yes    Comment: 08/18/2018 "couple drinks/year"  . Drug use: Never  . Sexual activity: Not Currently  Lifestyle  . Physical activity:    Days per week: 0 days    Minutes per session: 0 min  . Stress: Not at all  Relationships  . Social connections:    Talks on phone: More than three times a week    Gets together: More than three times a week    Attends religious service: More than 4 times per year    Active member of club or organization: Yes    Attends meetings of clubs or organizations: More than 4 times per year    Relationship status: Widowed  . Intimate partner violence:    Fear of current or ex partner: No    Emotionally abused: No    Physically abused: No    Forced sexual activity: No  Other Topics Concern  . Not on file  Social History Narrative  . Not on file    FAMILY HISTORY:  Family History  Problem Relation Age of Onset  . Prostate cancer Father   . Colon cancer Neg Hx     CURRENT MEDICATIONS:  Outpatient  Encounter Medications as of 12/10/2018  Medication Sig Note  . acetaminophen (TYLENOL) 500 MG tablet Take 500 mg by mouth every 6 (six) hours as needed for moderate pain or headache.    Marland Kitchen apixaban (ELIQUIS) 5 MG TABS tablet Take 2 tablets (10 mg total) by mouth 2 (two) times daily. Then 1 tablet (5 mg) two times daily starting 03/16/18 (Patient taking differently: Take 2.5 mg by mouth 2 (two) times daily. )   . Ascorbic Acid (VITAMIN C) 1000 MG tablet Take 1,000 mg by mouth daily.   . Calcium Carb-Cholecalciferol (CALCIUM 500 + D3) 500-600 MG-UNIT TABS Take 1 tablet  by mouth daily.    Marland Kitchen denosumab (PROLIA) 60 MG/ML SOLN injection Inject 60 mg into the skin every 6 (six) months. Administer in upper arm, thigh, or abdomen 10/07/2018: LAST DOSE:09/2018  . HUMIRA PEN 40 MG/0.8ML PNKT INJECT 40 MG SUBQ EVERY 14 DAYS. (Patient taking differently: Inject 40 mg as directed every 14 (fourteen) days. SATURDAYS.)   . lidocaine-prilocaine (EMLA) cream Apply to port site 1 hour prior to appointment 10/07/2018: NEW MEDICATION (PATIENT HAS NOT CHEMOTHERAPY TO DATE)  . Magnesium 500 MG TABS Take 500 mg by mouth daily.   . Naphazoline-Pheniramine (OPCON-A) 0.027-0.315 % SOLN Place 1 drop into both eyes 3 (three) times daily as needed (dry eyes).  09/21/2018: As needed , rare per patient  . PACLitaxel (TAXOL IV) Inject into the vein once a week. Weekly x 12 cycles starting 10/22/2018 10/07/2018: NEW MEDICATION (PATIENT HAS NOT CHEMOTHERAPY TO DATE)   . polyethylene glycol powder (GLYCOLAX/MIRALAX) powder Take 8.5 g by mouth daily as needed. (Patient taking differently: Take 8.5 g by mouth daily as needed (constipation.). )   . PRESCRIPTION MEDICATION Apply 1 application topically 2 (two) times daily as needed (eczema). Ketoconazole-fluticasone 1:1 compounded cream  08/03/2018: Compounded at [facility]  . prochlorperazine (COMPAZINE) 10 MG tablet Take 1 tablet (10 mg total) by mouth every 6 (six) hours as needed (Nausea or  vomiting). 10/07/2018: NEW MEDICATION (PATIENT HAS NOT CHEMOTHERAPY TO DATE)   . traMADol (ULTRAM) 50 MG tablet Take 1 tablet (50 mg total) by mouth every 6 (six) hours as needed.   . Trastuzumab (HERCEPTIN IV) Inject into the vein every 21 ( twenty-one) days. Every 3 weeks x 1 year starting 10/22/2018 10/07/2018: NEW THERAPY PATIENT HAS NOT STARTED YET  . mesalamine (APRISO) 0.375 g 24 hr capsule Take 4 capsules by mouth each morning. (Patient not taking: Reported on 11/19/2018)    No facility-administered encounter medications on file as of 12/10/2018.     ALLERGIES:  Allergies  Allergen Reactions  . Mercaptopurine Other (See Comments)    Dropped WBC  . Sulfa Antibiotics Other (See Comments)    Extreme Weakness     PHYSICAL EXAM:  ECOG Performance status: 1  Vitals:   12/10/18 0801  BP: (!) 144/74  Pulse: 82  Resp: 18  Temp: 98.2 F (36.8 C)  SpO2: 100%   Filed Weights   12/10/18 0801  Weight: 175 lb (79.4 kg)    Physical Exam Vitals signs reviewed.  Constitutional:      Appearance: Normal appearance.  Cardiovascular:     Rate and Rhythm: Normal rate and regular rhythm.     Heart sounds: Normal heart sounds.  Pulmonary:     Effort: Pulmonary effort is normal.     Breath sounds: Normal breath sounds.  Abdominal:     General: There is no distension.     Palpations: Abdomen is soft. There is no mass.  Musculoskeletal:        General: No swelling.  Neurological:     General: No focal deficit present.     Mental Status: She is alert and oriented to person, place, and time.  Psychiatric:        Mood and Affect: Mood normal.        Behavior: Behavior normal.      LABORATORY DATA:  I have reviewed the labs as listed.  CBC    Component Value Date/Time   WBC 7.3 12/10/2018 0750   RBC 3.91 12/10/2018 0750   HGB 12.0 12/10/2018 0750  HGB 14.4 03/10/2017 1254   HCT 36.8 12/10/2018 0750   HCT 41.5 03/10/2017 1254   PLT 231 12/10/2018 0750   PLT 249 03/10/2017  1254   MCV 94.1 12/10/2018 0750   MCV 91 03/10/2017 1254   MCH 30.7 12/10/2018 0750   MCHC 32.6 12/10/2018 0750   RDW 14.4 12/10/2018 0750   RDW 13.3 03/10/2017 1254   LYMPHSABS 1.9 12/10/2018 0750   LYMPHSABS 2.7 03/10/2017 1254   MONOABS 1.1 (H) 12/10/2018 0750   EOSABS 0.2 12/10/2018 0750   EOSABS 0.4 03/10/2017 1254   BASOSABS 0.0 12/10/2018 0750   BASOSABS 0.1 03/10/2017 1254   CMP Latest Ref Rng & Units 12/10/2018 12/01/2018 11/19/2018  Glucose 70 - 99 mg/dL 111(H) 105(H) 105(H)  BUN 8 - 23 mg/dL 15 19 22   Creatinine 0.44 - 1.00 mg/dL 0.95 1.04(H) 0.89  Sodium 135 - 145 mmol/L 137 137 138  Potassium 3.5 - 5.1 mmol/L 3.7 3.9 4.2  Chloride 98 - 111 mmol/L 105 104 106  CO2 22 - 32 mmol/L 25 25 25   Calcium 8.9 - 10.3 mg/dL 8.7(L) 8.5(L) 8.5(L)  Total Protein 6.5 - 8.1 g/dL 6.5 6.8 6.7  Total Bilirubin 0.3 - 1.2 mg/dL 0.5 0.6 0.6  Alkaline Phos 38 - 126 U/L 37(L) 33(L) 29(L)  AST 15 - 41 U/L 18 18 18   ALT 0 - 44 U/L 19 18 19        DIAGNOSTIC IMAGING:  I have independently reviewed the scans and discussed with the patient.   I have reviewed Venita Lick LPN's note and agree with the documentation.  I personally performed a face-to-face visit, made revisions and my assessment and plan is as follows.    ASSESSMENT & PLAN:   Breast cancer of upper-inner quadrant of left female breast ([place]) 1.  Stage I (PT1CNX) HER-2 positive left breast cancer: - Abnormal mammogram at [facility], followed by ultrasound-guided biopsy on 06/23/2018.  IDC, HER-2 positive, ER/PR positive, Ki 67 2%. - Stereotactic biopsy in [place] on 06/29/2018, of the left breast irregular mass within the posterior upper, slightly inner quadrant.  A coil-shaped clip was placed. 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  Ki 67 was 10%. - Patient had a history of left breast cancer in 1998, status post lumpectomy, followed by chemotherapy and radiation, 1 year of  tamoxifen which was switched to anastrozole for subsequent 4 years due to intolerance. - She had a history of right breast cancer in 2009, status post mastectomy.  She did not receive any adjuvant chemotherapy or radiation. - She was started on anastrozole on 07/21/2018, she took it for 2 weeks and felt very emotional.  She stopped taking it. -Left mastectomy on 08/19/2018, 1.1 cm IDC, grade 1, margins negative.  No lymph nodes were identified.,  ER was 100% positive, PR 7% positive and HER-2 3+ positive.  - I have called and talked to Dr. Claudette Laws (pathologist).  He clarified that there was an initial biopsy done on 06/23/2018, at [facility].  This report is not uploaded on epic.  The initial biopsy was reportedly positive for HER-2. -The second biopsy on 06/29/2018 was negative for HER-2.  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 - The second focus was not clearly identified on the mastectomy specimen. - Adjuvant chemotherapy with weekly paclitaxel for 12 cycles and Herceptin for 1 year was recommended. -2D echocardiogram on 10/07/2018 shows EF of  60 to 65%. - Weekly Herceptin and paclitaxel at 40 mg per metered squared started on 10/22/2018. -Week 6 treatment on 12/01/2018. -She denied any GI side effects.  However she felt very weak on Saturday and was limited to bed. -Her energy levels have improved at this time.  We reviewed her blood work.  She may proceed with her next cycle today. -We will see her back in 1 week for follow-up with repeat labs and treatment.   2.  Osteoporosis: -She had DEXA scan at [facility].  She receives Prolia every 6 months at Dr. Carmie End office. She will continue calcium and vitamin D twice daily.  3.  Recurrent DVT: -She had a history of DVT in 2000's after 1 week post trip to [place], treated with Coumadin until 5 years ago.  This was stopped secondary to bleed complicated by her ulcerative colitis. -She had a  recurrent right leg femoral and popliteal DVT on 03/08/2018 and currently on Eliquis.  No bleeding reported.   4.  Ulcerative colitis: -She is currently on mesalamine and Humira.  She does have some constipation alternating with diarrhea from her UC. -It has not worsened since the start of Herceptin.    Total time spent is 25 minutes with more than 50% of the time spent face-to-face discussing treatment plan, side effects and coordination of care.    Orders placed this encounter:  No orders of the defined types were placed in this encounter.     Derek Jack, MD [place] [phone]

## 2018-12-17 ENCOUNTER — Encounter (HOSPITAL_COMMUNITY): Payer: Self-pay

## 2018-12-17 ENCOUNTER — Inpatient Hospital Stay (HOSPITAL_BASED_OUTPATIENT_CLINIC_OR_DEPARTMENT_OTHER): Payer: Medicare Other | Admitting: Hematology

## 2018-12-17 ENCOUNTER — Inpatient Hospital Stay (HOSPITAL_COMMUNITY): Payer: Medicare Other | Attending: Hematology

## 2018-12-17 ENCOUNTER — Other Ambulatory Visit: Payer: Self-pay

## 2018-12-17 ENCOUNTER — Inpatient Hospital Stay (HOSPITAL_COMMUNITY): Payer: Medicare Other

## 2018-12-17 ENCOUNTER — Encounter (HOSPITAL_COMMUNITY): Payer: Self-pay | Admitting: Hematology

## 2018-12-17 VITALS — BP 132/72 | HR 68 | Temp 97.9°F | Resp 18

## 2018-12-17 DIAGNOSIS — Z170 Estrogen receptor positive status [ER+]: Principal | ICD-10-CM

## 2018-12-17 DIAGNOSIS — Z853 Personal history of malignant neoplasm of breast: Secondary | ICD-10-CM

## 2018-12-17 DIAGNOSIS — Z5111 Encounter for antineoplastic chemotherapy: Secondary | ICD-10-CM | POA: Insufficient documentation

## 2018-12-17 DIAGNOSIS — Z7901 Long term (current) use of anticoagulants: Secondary | ICD-10-CM | POA: Insufficient documentation

## 2018-12-17 DIAGNOSIS — R197 Diarrhea, unspecified: Secondary | ICD-10-CM | POA: Diagnosis not present

## 2018-12-17 DIAGNOSIS — Z87891 Personal history of nicotine dependence: Secondary | ICD-10-CM

## 2018-12-17 DIAGNOSIS — K5900 Constipation, unspecified: Secondary | ICD-10-CM

## 2018-12-17 DIAGNOSIS — M810 Age-related osteoporosis without current pathological fracture: Secondary | ICD-10-CM

## 2018-12-17 DIAGNOSIS — K5190 Ulcerative colitis, unspecified, without complications: Secondary | ICD-10-CM | POA: Insufficient documentation

## 2018-12-17 DIAGNOSIS — Z5112 Encounter for antineoplastic immunotherapy: Secondary | ICD-10-CM | POA: Insufficient documentation

## 2018-12-17 DIAGNOSIS — Z8042 Family history of malignant neoplasm of prostate: Secondary | ICD-10-CM | POA: Diagnosis not present

## 2018-12-17 DIAGNOSIS — C50212 Malignant neoplasm of upper-inner quadrant of left female breast: Secondary | ICD-10-CM | POA: Diagnosis present

## 2018-12-17 DIAGNOSIS — Z86718 Personal history of other venous thrombosis and embolism: Secondary | ICD-10-CM | POA: Insufficient documentation

## 2018-12-17 DIAGNOSIS — Z79899 Other long term (current) drug therapy: Secondary | ICD-10-CM | POA: Insufficient documentation

## 2018-12-17 LAB — CBC WITH DIFFERENTIAL/PLATELET
Abs Immature Granulocytes: 0.06 10*3/uL (ref 0.00–0.07)
Basophils Absolute: 0 10*3/uL (ref 0.0–0.1)
Basophils Relative: 1 %
Eosinophils Absolute: 0.2 10*3/uL (ref 0.0–0.5)
Eosinophils Relative: 4 %
HCT: 36.7 % (ref 36.0–46.0)
Hemoglobin: 11.8 g/dL — ABNORMAL LOW (ref 12.0–15.0)
Immature Granulocytes: 1 %
Lymphocytes Relative: 32 %
Lymphs Abs: 2.1 10*3/uL (ref 0.7–4.0)
MCH: 30.3 pg (ref 26.0–34.0)
MCHC: 32.2 g/dL (ref 30.0–36.0)
MCV: 94.1 fL (ref 80.0–100.0)
Monocytes Absolute: 0.8 10*3/uL (ref 0.1–1.0)
Monocytes Relative: 13 %
Neutro Abs: 3.2 10*3/uL (ref 1.7–7.7)
Neutrophils Relative %: 49 %
Platelets: 222 10*3/uL (ref 150–400)
RBC: 3.9 MIL/uL (ref 3.87–5.11)
RDW: 14.3 % (ref 11.5–15.5)
WBC: 6.4 10*3/uL (ref 4.0–10.5)
nRBC: 0 % (ref 0.0–0.2)

## 2018-12-17 LAB — COMPREHENSIVE METABOLIC PANEL
ALT: 21 U/L (ref 0–44)
AST: 18 U/L (ref 15–41)
Albumin: 3.5 g/dL (ref 3.5–5.0)
Alkaline Phosphatase: 34 U/L — ABNORMAL LOW (ref 38–126)
Anion gap: 7 (ref 5–15)
BUN: 20 mg/dL (ref 8–23)
CO2: 25 mmol/L (ref 22–32)
Calcium: 8.3 mg/dL — ABNORMAL LOW (ref 8.9–10.3)
Chloride: 106 mmol/L (ref 98–111)
Creatinine, Ser: 0.91 mg/dL (ref 0.44–1.00)
GFR calc Af Amer: >60 mL/min (ref >60)
GFR calc non Af Amer: 60 mL/min — ABNORMAL LOW (ref >60)
Glucose, Bld: 107 mg/dL — ABNORMAL HIGH (ref 70–99)
Potassium: 4 mmol/L (ref 3.5–5.1)
Sodium: 138 mmol/L (ref 135–145)
Total Bilirubin: 0.3 mg/dL (ref 0.3–1.2)
Total Protein: 6.6 g/dL (ref 6.5–8.1)

## 2018-12-17 LAB — MAGNESIUM: Magnesium: 2.4 mg/dL (ref 1.7–2.4)

## 2018-12-17 MED ORDER — TRASTUZUMAB CHEMO 150 MG IV SOLR
150.0000 mg | Freq: Once | INTRAVENOUS | Status: AC
  Administered 2018-12-17: 10:00:00 150 mg via INTRAVENOUS
  Filled 2018-12-17: qty 7.14

## 2018-12-17 MED ORDER — SODIUM CHLORIDE 0.9 % IV SOLN
40.0000 mg/m2 | Freq: Once | INTRAVENOUS | Status: AC
  Administered 2018-12-17: 11:00:00 72 mg via INTRAVENOUS
  Filled 2018-12-17: qty 12

## 2018-12-17 MED ORDER — SODIUM CHLORIDE 0.9 % IV SOLN
Freq: Once | INTRAVENOUS | Status: AC
  Administered 2018-12-17: 09:00:00 via INTRAVENOUS

## 2018-12-17 MED ORDER — ACETAMINOPHEN 325 MG PO TABS
650.0000 mg | ORAL_TABLET | Freq: Once | ORAL | Status: AC
  Administered 2018-12-17: 09:00:00 650 mg via ORAL
  Filled 2018-12-17: qty 2

## 2018-12-17 MED ORDER — DIPHENHYDRAMINE HCL 50 MG/ML IJ SOLN
25.0000 mg | Freq: Once | INTRAMUSCULAR | Status: AC
  Administered 2018-12-17: 09:00:00 25 mg via INTRAVENOUS
  Filled 2018-12-17: qty 1

## 2018-12-17 MED ORDER — FAMOTIDINE IN NACL 20-0.9 MG/50ML-% IV SOLN
20.0000 mg | Freq: Once | INTRAVENOUS | Status: AC
  Administered 2018-12-17: 09:00:00 20 mg via INTRAVENOUS
  Filled 2018-12-17: qty 50

## 2018-12-17 MED ORDER — HEPARIN SOD (PORK) LOCK FLUSH 100 UNIT/ML IV SOLN
500.0000 [IU] | Freq: Once | INTRAVENOUS | Status: AC | PRN
  Administered 2018-12-17: 13:00:00 500 [IU]

## 2018-12-17 MED ORDER — SODIUM CHLORIDE 0.9 % IV SOLN
20.0000 mg | Freq: Once | INTRAVENOUS | Status: AC
  Administered 2018-12-17: 10:00:00 20 mg via INTRAVENOUS
  Filled 2018-12-17: qty 20

## 2018-12-17 MED ORDER — SODIUM CHLORIDE 0.9% FLUSH
10.0000 mL | INTRAVENOUS | Status: DC | PRN
  Administered 2018-12-17: 10 mL
  Filled 2018-12-17: qty 10

## 2018-12-17 NOTE — Patient Instructions (Addendum)
[facility] Discharge Instructions  You were seen today by Dr. Katragadda. He went over your recent lab results. He will see you back in 1 week for labs and follow up.   Thank you for choosing  Cancer Center at [facility] to provide your oncology and hematology care.  To afford each patient quality time with our provider, please arrive at least 15 minutes before your scheduled appointment time.   If you have a lab appointment with the Cancer Center please come in thru the  Main Entrance and check in at the main information desk  You need to re-schedule your appointment should you arrive 10 or more minutes late.  We strive to give you quality time with our providers, and arriving late affects you and other patients whose appointments are after yours.  Also, if you no show three or more times for appointments you may be dismissed from the clinic at the providers discretion.     Again, thank you for choosing [facility].  Our hope is that these requests will decrease the amount of time that you wait before being seen by our physicians.       _____________________________________________________________  Should you have questions after your visit to [facility], please contact our office at [phone] between the hours of 8:00 a.m. and 4:30 p.m.  Voicemails left after 4:00 p.m. will not be returned until the following business day.  For prescription refill requests, have your pharmacy contact our office and allow 72 hours.    Cancer Center Support Programs:   > Cancer Support Group  2nd Tuesday of the month 1pm-2pm, Journey Room    

## 2018-12-17 NOTE — Patient Instructions (Signed)
[facility] Discharge Instructions for Patients Receiving Chemotherapy  Today you received the following chemotherapy agents  If you develop nausea and vomiting that is not controlled by your nausea medication, call the clinic.   BELOW ARE SYMPTOMS THAT SHOULD BE REPORTED IMMEDIATELY:  *FEVER GREATER THAN 100.5 F  *CHILLS WITH OR WITHOUT FEVER  NAUSEA AND VOMITING THAT IS NOT CONTROLLED WITH YOUR NAUSEA MEDICATION  *UNUSUAL SHORTNESS OF BREATH  *UNUSUAL BRUISING OR BLEEDING  TENDERNESS IN MOUTH AND THROAT WITH OR WITHOUT PRESENCE OF ULCERS  *URINARY PROBLEMS  *BOWEL PROBLEMS  UNUSUAL RASH Items with * indicate a potential emergency and should be followed up as soon as possible.  Feel free to call the clinic should you have any questions or concerns. The clinic phone number is [phone].  Please show the [facility] at check-in to the Emergency Department and triage nurse.

## 2018-12-17 NOTE — Progress Notes (Signed)
[place], [place] [address]   CLINIC:  Medical Oncology/Hematology  PCP:  Octavio Graves, DO [address] [place] [address] [phone]   REASON FOR VISIT:  Follow-up for left breast cancer    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 breast ([place])   07/21/2018 Initial Diagnosis    Breast cancer of upper-inner quadrant of left female breast ([place])    10/22/2018 -  Chemotherapy    The patient had trastuzumab (HERCEPTIN) 300 mg in sodium chloride 0.9 % 250 mL chemo infusion, 315 mg, Intravenous,  Once, 2 of 16 cycles Administration: 300 mg (10/22/2018), 150 mg (10/29/2018), 150 mg (11/19/2018), 150 mg (11/05/2018), 150 mg (11/12/2018), 150 mg (12/01/2018), 150 mg (12/10/2018), 150 mg (12/17/2018) PACLitaxel (TAXOL) 72 mg in sodium chloride 0.9 % 150 mL chemo infusion (</= 22m/m2), 40 mg/m2 = 72 mg (50 % of original dose 80 mg/m2), Intravenous,  Once, 2 of 3 cycles Dose modification: 40 mg/m2 (50 % of original dose 80 mg/m2, Cycle 1, Reason: Provider Judgment) Administration: 72 mg (10/22/2018), 72 mg (10/29/2018), 72 mg (11/19/2018), 72 mg (11/05/2018), 72 mg (11/12/2018), 72 mg (12/01/2018), 72 mg (12/10/2018), 72 mg (12/17/2018)  for chemotherapy treatment.       CANCER STAGING: Cancer Staging No matching staging information was found for the patient.   INTERVAL HISTORY:  Ms. [id number]. female returns for routine follow-up and consideration for next cycle of chemotherapy.She is here today alone. She states that she has noticed that her hair is thining. She states that her numbness in her hands and feet is about the same except she has noticed more in her hand. Denies any nausea, vomiting, or diarrhea. Denies any new pains. Had not noticed any recent bleeding such as epistaxis, hematuria or hematochezia. Denies recent chest pain on exertion, shortness of breath on minimal exertion, pre-syncopal episodes, or  palpitations. Denies any recent fevers, infections, or recent hospitalizations. Patient reports appetite at 100% and energy level at 100%.     REVIEW OF SYSTEMS:  Review of Systems  Neurological: Positive for numbness.     PAST MEDICAL/SURGICAL HISTORY:  Past Medical History:  Diagnosis Date  . Adult idiopathic generalized osteoporosis   . Arthritis    "minor" (08/18/2018)  . Breast cancer, [place] breast ([facility] [place]   lumpectomy  . Breast cancer, right breast ([facility] 2009   lumpectomy; mastectomy  . DVT (deep venous thrombosis) ([facility] "early 2000's"   RLE  . [place]) ([facility]   . Hemorrhoids, external   . History of blood transfusion    "related to ulcerative colitis"  . Lower abdominal pain   . Occult blood in stools   . Peripheral edema   . Seizures ([facility]    one very slight seizure with the stroke; somewhere between 2005-2009  . Stroke ([facility] 2005-2009   "very light;" ; denies residual on 08/19/2018  . UC (ulcerative colitis) ([facility] [address]  Past Surgical History:  Procedure Laterality Date  . APPENDECTOMY    . bone density  12/11/10  . BREAST BIOPSY Left 1998; 2019 X 2  . BREAST BIOPSY Right 2009  . BREAST LUMPECTOMY Left 1998  . CATARACT EXTRACTION W/ INTRAOCULAR LENS  IMPLANT, BILATERAL Bilateral   . COLONOSCOPY  02/21/2011  . COLONOSCOPY  10/06/2008  . COLONOSCOPY  12/27/01  . COLONOSCOPY  05/09/05  . COLONOSCOPY N/A 07/02/2016   Procedure: COLONOSCOPY;  Surgeon: [name], MD;  Location: [facility] SUITE;  Service: Endoscopy;  Laterality: N/A;  1055  . [place]   "I was having rectal bleeding"  . FRACTURE SURGERY    . MASTECTOMY Right 2009  . MASTECTOMY COMPLETE / SIMPLE Left 08/19/2018  . PORTACATH PLACEMENT Right 10/15/2018   Procedure: INSERTION PORT-A-CATH RIGHT SUBCLAVIN;  Surgeon: [name], MD;  Location: [facility] ORS;  Service: General;  Laterality: Right;  pt knows to arrive at 7:30  . SIMPLE MASTECTOMY WITH AXILLARY  SENTINEL NODE BIOPSY Left 08/19/2018   Procedure: LEFT SIMPLE MASTECTOMY;  Surgeon: [name], MD;  Location: [facility];  Service: General;  Laterality: Left;  . TONSILLECTOMY    . VENA CAVA FILTER PLACEMENT Right   . WRIST FRACTURE SURGERY Right      SOCIAL HISTORY:  Social History   Socioeconomic History  . Marital status: Widowed    Spouse name: Not on file  . Number of children: 0  . Years of education: Not on file  . Highest education level: Not on file  Occupational History  . Occupation: Optometrist: [employer]  Social Needs  . Financial resource strain: Not hard at all  . Food insecurity:    Worry: Never true    Inability: Never true  . Transportation needs:    Medical: No    Non-medical: No  Tobacco Use  . Smoking status: Former Smoker    Packs/day: 2.00    Years: 40.00    Pack years: 80.00    Types: Cigarettes    Last attempt to quit: 05/05/2001    Years since quitting: 17.6  . Smokeless tobacco: Never Used  Substance and Sexual Activity  . Alcohol use: Yes    Comment: 08/18/2018 "couple drinks/year"  . Drug use: Never  . Sexual activity: Not Currently  Lifestyle  . Physical activity:    Days per week: 0 days    Minutes per session: 0 min  . Stress: Not at all  Relationships  . Social connections:    Talks on phone: More than three times a week    Gets together: More than three times a week    Attends religious service: More than 4 times per year    Active member of club or organization: Yes    Attends meetings of clubs or organizations: More than 4 times per year    Relationship status: Widowed  . Intimate partner violence:    Fear of current or ex partner: No    Emotionally abused: No    Physically abused: No    Forced sexual activity: No  Other Topics Concern  . Not on file  Social History Narrative  . Not on file    FAMILY HISTORY:  Family History  Problem Relation Age of Onset  . Prostate cancer Father    . Colon cancer Neg Hx     CURRENT MEDICATIONS:  Outpatient Encounter Medications as of 12/17/2018  Medication Sig Note  . acetaminophen (TYLENOL) 500 MG tablet Take 500 mg by mouth every 6 (six) hours as needed for moderate pain or headache.    Marland Kitchen apixaban (ELIQUIS) 5 MG TABS tablet Take 2 tablets (10 mg total) by mouth 2 (two) times daily. Then 1 tablet (5 mg) two times daily starting 03/16/18 (Patient taking differently: Take 2.5 mg by mouth 2 (two) times daily. )   . Ascorbic Acid (VITAMIN C) 1000 MG tablet Take 1,000 mg by  mouth daily.   . Calcium Carb-Cholecalciferol (CALCIUM 500 + D3) 500-600 MG-UNIT TABS Take 1 tablet by mouth daily.    Marland Kitchen denosumab (PROLIA) 60 MG/ML SOLN injection Inject 60 mg into the skin every 6 (six) months. Administer in upper arm, thigh, or abdomen 10/07/2018: LAST DOSE:09/2018  . HUMIRA PEN 40 MG/0.8ML PNKT INJECT 40 MG SUBQ EVERY 14 DAYS. (Patient taking differently: Inject 40 mg as directed every 14 (fourteen) days. SATURDAYS.)   . lidocaine-prilocaine (EMLA) cream Apply to port site 1 hour prior to appointment 10/07/2018: NEW MEDICATION (PATIENT HAS NOT CHEMOTHERAPY TO DATE)  . Magnesium 500 MG TABS Take 500 mg by mouth daily.   . mesalamine (APRISO) 0.375 g 24 hr capsule Take 4 capsules by mouth each morning.   . Naphazoline-Pheniramine (OPCON-A) 0.027-0.315 % SOLN Place 1 drop into both eyes 3 (three) times daily as needed (dry eyes).  09/21/2018: As needed , rare per patient  . PACLitaxel (TAXOL IV) Inject into the vein once a week. Weekly x 12 cycles starting 10/22/2018 10/07/2018: NEW MEDICATION (PATIENT HAS NOT CHEMOTHERAPY TO DATE)   . polyethylene glycol powder (GLYCOLAX/MIRALAX) powder Take 8.5 g by mouth daily as needed. (Patient taking differently: Take 8.5 g by mouth daily as needed (constipation.). )   . PRESCRIPTION MEDICATION Apply 1 application topically 2 (two) times daily as needed (eczema). Ketoconazole-fluticasone 1:1 compounded cream  08/03/2018:  Compounded at [facility]  . prochlorperazine (COMPAZINE) 10 MG tablet Take 1 tablet (10 mg total) by mouth every 6 (six) hours as needed (Nausea or vomiting). 10/07/2018: NEW MEDICATION (PATIENT HAS NOT CHEMOTHERAPY TO DATE)   . traMADol (ULTRAM) 50 MG tablet Take 1 tablet (50 mg total) by mouth every 6 (six) hours as needed.   . Trastuzumab (HERCEPTIN IV) Inject into the vein every 21 ( twenty-one) days. Every 3 weeks x 1 year starting 10/22/2018 10/07/2018: NEW THERAPY PATIENT HAS NOT STARTED YET   No facility-administered encounter medications on file as of 12/17/2018.     ALLERGIES:  Allergies  Allergen Reactions  . Mercaptopurine Other (See Comments)    Dropped WBC  . Sulfa Antibiotics Other (See Comments)    Extreme Weakness     PHYSICAL EXAM:  ECOG Performance status: 1  Vitals:   12/17/18 0825  BP: (!) 120/59  Pulse: 70  Resp: 16  Temp: 97.7 F (36.5 C)  SpO2: 100%   Filed Weights   12/17/18 0825  Weight: 177 lb 3.2 oz (80.4 kg)    Physical Exam Vitals signs reviewed.  Constitutional:      Appearance: Normal appearance.  Cardiovascular:     Rate and Rhythm: Normal rate and regular rhythm.     Heart sounds: Normal heart sounds.  Pulmonary:     Effort: Pulmonary effort is normal.     Breath sounds: Normal breath sounds.  Abdominal:     General: There is no distension.     Palpations: Abdomen is soft. There is no mass.  Musculoskeletal:        General: No swelling.  Skin:    General: Skin is warm.  Neurological:     General: No focal deficit present.     Mental Status: She is alert and oriented to person, place, and time.  Psychiatric:        Mood and Affect: Mood normal.        Behavior: Behavior normal.      LABORATORY DATA:  I have reviewed the labs as listed.  CBC  Component Value Date/Time   WBC 6.4 12/17/2018 0818   RBC 3.90 12/17/2018 0818   HGB 11.8 (L) 12/17/2018 0818   HGB 14.4 03/10/2017 1254   HCT 36.7 12/17/2018 0818   HCT 41.5  03/10/2017 1254   PLT 222 12/17/2018 0818   PLT 249 03/10/2017 1254   MCV 94.1 12/17/2018 0818   MCV 91 03/10/2017 1254   MCH 30.3 12/17/2018 0818   MCHC 32.2 12/17/2018 0818   RDW 14.3 12/17/2018 0818   RDW 13.3 03/10/2017 1254   LYMPHSABS 2.1 12/17/2018 0818   LYMPHSABS 2.7 03/10/2017 1254   MONOABS 0.8 12/17/2018 0818   EOSABS 0.2 12/17/2018 0818   EOSABS 0.4 03/10/2017 1254   BASOSABS 0.0 12/17/2018 0818   BASOSABS 0.1 03/10/2017 1254   CMP Latest Ref Rng & Units 12/17/2018 12/10/2018 12/01/2018  Glucose 70 - 99 mg/dL 107(H) 111(H) 105(H)  BUN 8 - 23 mg/dL 20 15 19   Creatinine 0.44 - 1.00 mg/dL 0.91 0.95 1.04(H)  Sodium 135 - 145 mmol/L 138 137 137  Potassium 3.5 - 5.1 mmol/L 4.0 3.7 3.9  Chloride 98 - 111 mmol/L 106 105 104  CO2 22 - 32 mmol/L 25 25 25   Calcium 8.9 - 10.3 mg/dL 8.3(L) 8.7(L) 8.5(L)  Total Protein 6.5 - 8.1 g/dL 6.6 6.5 6.8  Total Bilirubin 0.3 - 1.2 mg/dL 0.3 0.5 0.6  Alkaline Phos 38 - 126 U/L 34(L) 37(L) 33(L)  AST 15 - 41 U/L 18 18 18   ALT 0 - 44 U/L 21 19 18        DIAGNOSTIC IMAGING:  I have independently reviewed the scans and discussed with the patient.   I have reviewed Venita Lick LPN's note and agree with the documentation.  I personally performed a face-to-face visit, made revisions and my assessment and plan is as follows.    ASSESSMENT & PLAN:   Breast cancer of upper-inner quadrant of left female breast ([place]) 1.  Stage I (PT1CNX) HER-2 positive left breast cancer: - Abnormal mamm[facility] at [facility], followed by ultrasound-guided biopsy on 06/23/2018.  IDC, HER-2 positive, ER/PR positive, Ki 67 2%. - Stereotactic biopsy in [place] on 06/29/2018, of the left breast irregular mass within the posterior upper, slightly inner quadrant.  A coil-shaped clip was placed. 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  Ki 67 was 10%. - Patient had a history of left breast cancer in 1998, status post  lumpectomy, followed by chemotherapy and radiation, 1 year of tamoxifen which was switched to anastrozole for subsequent 4 years due to intolerance. - She had a history of right breast cancer in 2009, status post mastectomy.  She did not receive any adjuvant chemotherapy or radiation. - She was started on anastrozole on 07/21/2018, she took it for 2 weeks and felt very emotional.  She stopped taking it. -Left mastectomy on 08/19/2018, 1.1 cm IDC, grade 1, margins negative.  No lymph nodes were identified.,  ER was 100% positive, PR 7% positive and HER-2 3+ positive.  - I have called and talked to Dr. Claudette Laws (pathologist).  He clarified that there was an initial biopsy done on 06/23/2018, at [facility].  This report is not uploaded on epic.  The initial biopsy was reportedly positive for HER-2. -The second biopsy on 06/29/2018 was negative for HER-2.  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 - The second focus was not clearly identified on the mastectomy specimen. -  Adjuvant chemotherapy with weekly paclitaxel for 12 cycles and Herceptin for 1 year was recommended. -2D echocardiogram on 10/07/2018 shows EF of 60 to 65%. - Weekly paclitaxel and Herceptin started on 10/22/2018. -Week 7 treatment on 12/10/2018.  She developed left hand numbness on and off which is new.  Bilateral feet numbness has been stable.  We will keep a close eye on it. - She continues to tolerate treatment well. Blood work is acceptable. She will proceed with scheduled treatment today.  -We will see her back in 1 week for follow-up with repeat labs and treatment.   2.  Osteoporosis: -She had DEXA scan at [facility].  She receives Prolia every 6 months at Dr. Carmie End office. --She will continue calcium and vitamin D twice daily.  3.  Recurrent DVT: -She had a history of DVT in 2000's after 1 week post trip to [place], treated with Coumadin until 5 years ago.  This was  stopped secondary to bleed complicated by her ulcerative colitis. -She had a recurrent right leg femoral and popliteal DVT on 03/08/2018 and currently on Eliquis.  No bleeding reported.   4.  Ulcerative colitis: -She is currently on mesalamine and Humira.  She does have some constipation alternating with diarrhea from her UC. -It has not worsened since the start of Herceptin.    Total time spent is 25 minutes with more than 50% of the time spent face-to-face discussing symptom management and coordination of care.    Orders placed this encounter:  No orders of the defined types were placed in this encounter.     Derek Jack, MD [place] [phone]

## 2018-12-17 NOTE — Progress Notes (Signed)
Patient seen by Dr. Delton Coombes with lab review and ok to treat today verbal order.   Patient tolerated chemotherapy with no complaints voiced.  Port site clean and dry with no bruising or swelling noted at site.  Good blood return noted before and after administration of chemotherapy.  Band aid applied.  Patient left ambulatory with VSS and no s/s of distress noted.

## 2018-12-17 NOTE — Assessment & Plan Note (Addendum)
1.  Stage I (PT1CNX) HER-2 positive left breast cancer: - Abnormal mammogram at [facility], followed by ultrasound-guided biopsy on 06/23/2018.  IDC, HER-2 positive, ER/PR positive, Ki 67 2%. - Stereotactic biopsy in [place] on 06/29/2018, of the left breast irregular mass within the posterior upper, slightly inner quadrant.  A coil-shaped clip was placed. 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  Ki 67 was 10%. - Patient had a history of left breast cancer in 1998, status post lumpectomy, followed by chemotherapy and radiation, 1 year of tamoxifen which was switched to anastrozole for subsequent 4 years due to intolerance. - She had a history of right breast cancer in 2009, status post mastectomy.  She did not receive any adjuvant chemotherapy or radiation. - She was started on anastrozole on 07/21/2018, she took it for 2 weeks and felt very emotional.  She stopped taking it. -Left mastectomy on 08/19/2018, 1.1 cm IDC, grade 1, margins negative.  No lymph nodes were identified.,  ER was 100% positive, PR 7% positive and HER-2 3+ positive.  - I have called and talked to Dr. Claudette Laws (pathologist).  He clarified that there was an initial biopsy done on 06/23/2018, at [facility].  This report is not uploaded on epic.  The initial biopsy was reportedly positive for HER-2. -The second biopsy on 06/29/2018 was negative for HER-2.  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 - The second focus was not clearly identified on the mastectomy specimen. - Adjuvant chemotherapy with weekly paclitaxel for 12 cycles and Herceptin for 1 year was recommended. -2D echocardiogram on 10/07/2018 shows EF of 60 to 65%. - Weekly paclitaxel and Herceptin started on 10/22/2018. -Week 7 treatment on 12/10/2018.  She developed left hand numbness on and off which is new.  Bilateral feet numbness has been stable.  We will keep a  close eye on it. - She continues to tolerate treatment well. Blood work is acceptable. She will proceed with scheduled treatment today.  -We will see her back in 1 week for follow-up with repeat labs and treatment.   2.  Osteoporosis: -She had DEXA scan at [facility].  She receives Prolia every 6 months at Dr. Carmie End office. --She will continue calcium and vitamin D twice daily.  3.  Recurrent DVT: -She had a history of DVT in 2000's after 1 week post trip to [place], treated with Coumadin until 5 years ago.  This was stopped secondary to bleed complicated by her ulcerative colitis. -She had a recurrent right leg femoral and popliteal DVT on 03/08/2018 and currently on Eliquis.  No bleeding reported.   4.  Ulcerative colitis: -She is currently on mesalamine and Humira.  She does have some constipation alternating with diarrhea from her UC. -It has not worsened since the start of Herceptin.

## 2018-12-24 ENCOUNTER — Other Ambulatory Visit: Payer: Self-pay

## 2018-12-24 ENCOUNTER — Encounter (HOSPITAL_COMMUNITY): Payer: Self-pay

## 2018-12-24 ENCOUNTER — Inpatient Hospital Stay (HOSPITAL_COMMUNITY): Payer: Medicare Other

## 2018-12-24 ENCOUNTER — Encounter (HOSPITAL_COMMUNITY): Payer: Self-pay | Admitting: Hematology

## 2018-12-24 ENCOUNTER — Inpatient Hospital Stay (HOSPITAL_COMMUNITY): Payer: Medicare Other | Attending: Hematology | Admitting: Hematology

## 2018-12-24 VITALS — BP 161/59 | HR 79 | Temp 97.7°F | Resp 16 | Wt 174.8 lb

## 2018-12-24 VITALS — BP 138/66 | HR 71 | Temp 97.6°F | Resp 16

## 2018-12-24 DIAGNOSIS — Z9013 Acquired absence of bilateral breasts and nipples: Secondary | ICD-10-CM | POA: Diagnosis not present

## 2018-12-24 DIAGNOSIS — C50212 Malignant neoplasm of upper-inner quadrant of left female breast: Secondary | ICD-10-CM | POA: Diagnosis present

## 2018-12-24 DIAGNOSIS — Z86718 Personal history of other venous thrombosis and embolism: Secondary | ICD-10-CM | POA: Diagnosis not present

## 2018-12-24 DIAGNOSIS — Z170 Estrogen receptor positive status [ER+]: Secondary | ICD-10-CM

## 2018-12-24 DIAGNOSIS — M818 Other osteoporosis without current pathological fracture: Secondary | ICD-10-CM | POA: Insufficient documentation

## 2018-12-24 DIAGNOSIS — Z5112 Encounter for antineoplastic immunotherapy: Secondary | ICD-10-CM | POA: Diagnosis not present

## 2018-12-24 DIAGNOSIS — K5190 Ulcerative colitis, unspecified, without complications: Secondary | ICD-10-CM | POA: Insufficient documentation

## 2018-12-24 DIAGNOSIS — Z9010 Acquired absence of unspecified breast and nipple: Secondary | ICD-10-CM

## 2018-12-24 DIAGNOSIS — Z853 Personal history of malignant neoplasm of breast: Secondary | ICD-10-CM | POA: Insufficient documentation

## 2018-12-24 DIAGNOSIS — Z87891 Personal history of nicotine dependence: Secondary | ICD-10-CM | POA: Diagnosis not present

## 2018-12-24 DIAGNOSIS — Z8042 Family history of malignant neoplasm of prostate: Secondary | ICD-10-CM

## 2018-12-24 DIAGNOSIS — Z79899 Other long term (current) drug therapy: Secondary | ICD-10-CM | POA: Diagnosis not present

## 2018-12-24 DIAGNOSIS — Z8673 Personal history of transient ischemic attack (TIA), and cerebral infarction without residual deficits: Secondary | ICD-10-CM

## 2018-12-24 LAB — COMPREHENSIVE METABOLIC PANEL
ALT: 19 U/L (ref 0–44)
AST: 18 U/L (ref 15–41)
Albumin: 3.4 g/dL — ABNORMAL LOW (ref 3.5–5.0)
Alkaline Phosphatase: 29 U/L — ABNORMAL LOW (ref 38–126)
Anion gap: 7 (ref 5–15)
BUN: 21 mg/dL (ref 8–23)
CO2: 24 mmol/L (ref 22–32)
Calcium: 8.6 mg/dL — ABNORMAL LOW (ref 8.9–10.3)
Chloride: 107 mmol/L (ref 98–111)
Creatinine, Ser: 0.94 mg/dL (ref 0.44–1.00)
GFR calc Af Amer: >60 mL/min (ref >60)
GFR calc non Af Amer: 57 mL/min — ABNORMAL LOW (ref >60)
Glucose, Bld: 109 mg/dL — ABNORMAL HIGH (ref 70–99)
Potassium: 4.3 mmol/L (ref 3.5–5.1)
Sodium: 138 mmol/L (ref 135–145)
Total Bilirubin: 0.4 mg/dL (ref 0.3–1.2)
Total Protein: 6.4 g/dL — ABNORMAL LOW (ref 6.5–8.1)

## 2018-12-24 LAB — CBC WITH DIFFERENTIAL/PLATELET
Abs Immature Granulocytes: 0.05 10*3/uL (ref 0.00–0.07)
Basophils Absolute: 0 10*3/uL (ref 0.0–0.1)
Basophils Relative: 1 %
Eosinophils Absolute: 0.2 10*3/uL (ref 0.0–0.5)
Eosinophils Relative: 3 %
HCT: 35.7 % — ABNORMAL LOW (ref 36.0–46.0)
Hemoglobin: 11.5 g/dL — ABNORMAL LOW (ref 12.0–15.0)
Immature Granulocytes: 1 %
Lymphocytes Relative: 32 %
Lymphs Abs: 2 10*3/uL (ref 0.7–4.0)
MCH: 30.4 pg (ref 26.0–34.0)
MCHC: 32.2 g/dL (ref 30.0–36.0)
MCV: 94.4 fL (ref 80.0–100.0)
Monocytes Absolute: 0.8 10*3/uL (ref 0.1–1.0)
Monocytes Relative: 12 %
Neutro Abs: 3.2 10*3/uL (ref 1.7–7.7)
Neutrophils Relative %: 51 %
Platelets: 219 10*3/uL (ref 150–400)
RBC: 3.78 MIL/uL — ABNORMAL LOW (ref 3.87–5.11)
RDW: 15 % (ref 11.5–15.5)
WBC: 6.2 10*3/uL (ref 4.0–10.5)
nRBC: 0 % (ref 0.0–0.2)

## 2018-12-24 MED ORDER — SODIUM CHLORIDE 0.9 % IV SOLN
40.0000 mg/m2 | Freq: Once | INTRAVENOUS | Status: AC
  Administered 2018-12-24: 11:00:00 72 mg via INTRAVENOUS
  Filled 2018-12-24: qty 12

## 2018-12-24 MED ORDER — HEPARIN SOD (PORK) LOCK FLUSH 100 UNIT/ML IV SOLN
500.0000 [IU] | Freq: Once | INTRAVENOUS | Status: AC | PRN
  Administered 2018-12-24: 12:00:00 500 [IU]

## 2018-12-24 MED ORDER — SODIUM CHLORIDE 0.9% FLUSH
10.0000 mL | INTRAVENOUS | Status: DC | PRN
  Administered 2018-12-24: 09:00:00 10 mL
  Filled 2018-12-24: qty 10

## 2018-12-24 MED ORDER — SODIUM CHLORIDE 0.9 % IV SOLN
Freq: Once | INTRAVENOUS | Status: AC
  Administered 2018-12-24: 09:00:00 via INTRAVENOUS

## 2018-12-24 MED ORDER — SODIUM CHLORIDE 0.9 % IV SOLN
20.0000 mg | Freq: Once | INTRAVENOUS | Status: AC
  Administered 2018-12-24: 10:00:00 20 mg via INTRAVENOUS
  Filled 2018-12-24: qty 20

## 2018-12-24 MED ORDER — ACETAMINOPHEN 325 MG PO TABS
650.0000 mg | ORAL_TABLET | Freq: Once | ORAL | Status: AC
  Administered 2018-12-24: 09:00:00 650 mg via ORAL
  Filled 2018-12-24: qty 2

## 2018-12-24 MED ORDER — DIPHENHYDRAMINE HCL 50 MG/ML IJ SOLN
25.0000 mg | Freq: Once | INTRAMUSCULAR | Status: AC
  Administered 2018-12-24: 09:00:00 25 mg via INTRAVENOUS
  Filled 2018-12-24: qty 1

## 2018-12-24 MED ORDER — TRASTUZUMAB CHEMO 150 MG IV SOLR
150.0000 mg | Freq: Once | INTRAVENOUS | Status: AC
  Administered 2018-12-24: 12:00:00 150 mg via INTRAVENOUS
  Filled 2018-12-24: qty 7.14

## 2018-12-24 MED ORDER — FAMOTIDINE IN NACL 20-0.9 MG/50ML-% IV SOLN
20.0000 mg | Freq: Once | INTRAVENOUS | Status: AC
  Administered 2018-12-24: 09:00:00 20 mg via INTRAVENOUS
  Filled 2018-12-24: qty 50

## 2018-12-24 NOTE — Progress Notes (Signed)
[place], [place] [address]   CLINIC:  Medical Oncology/Hematology  PCP:  Octavio Graves, DO [address] [place] [address] [phone]   REASON FOR VISIT:  Follow-up for left breast cancer    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 breast ([place])   07/21/2018 Initial Diagnosis    Breast cancer of upper-inner quadrant of left female breast ([place])    10/22/2018 -  Chemotherapy    The patient had trastuzumab (HERCEPTIN) 300 mg in sodium chloride 0.9 % 250 mL chemo infusion, 315 mg, Intravenous,  Once, 3 of 16 cycles Administration: 300 mg (10/22/2018), 150 mg (10/29/2018), 150 mg (11/19/2018), 150 mg (11/05/2018), 150 mg (11/12/2018), 150 mg (12/01/2018), 150 mg (12/10/2018), 150 mg (12/17/2018), 150 mg (12/24/2018) PACLitaxel (TAXOL) 72 mg in sodium chloride 0.9 % 150 mL chemo infusion (</= 12m/m2), 40 mg/m2 = 72 mg (50 % of original dose 80 mg/m2), Intravenous,  Once, 3 of 3 cycles Dose modification: 40 mg/m2 (50 % of original dose 80 mg/m2, Cycle 1, Reason: Provider Judgment) Administration: 72 mg (10/22/2018), 72 mg (10/29/2018), 72 mg (11/19/2018), 72 mg (11/05/2018), 72 mg (11/12/2018), 72 mg (12/01/2018), 72 mg (12/10/2018), 72 mg (12/17/2018), 72 mg (12/24/2018)  for chemotherapy treatment.       CANCER STAGING: Cancer Staging No matching staging information was found for the patient.   INTERVAL HISTORY:  Ms. WBaxley89y.o. female returns for routine follow-up and consideration for next cycle of chemotherapy. She is here today alone. She states that has felt good since her last visit. Denies any nausea, vomiting, or diarrhea. Denies any new pains. Had not noticed any recent bleeding such as epistaxis, hematuria or hematochezia. Denies recent chest pain on exertion, shortness of breath on minimal exertion, pre-syncopal episodes, or palpitations. Denies any numbness or tingling in hands or feet. Denies any recent  fevers, infections, or recent hospitalizations. Patient reports appetite at 100% and energy level at 100%.     REVIEW OF SYSTEMS:  Review of Systems  All other systems reviewed and are negative.    PAST MEDICAL/SURGICAL HISTORY:  Past Medical History:  Diagnosis Date  . Adult idiopathic generalized osteoporosis   . Arthritis    "minor" (08/18/2018)  . Breast cancer, left breast ([facility] 1998   lumpectomy  . Breast cancer, right breast ([facility] 2009   lumpectomy; mastectomy  . DVT (deep venous thrombosis) ([facility] "early 2000's"   RLE  . DVT (deep venous thrombosis) ([facility]   . Hemorrhoids, external   . History of blood transfusion    "related to ulcerative colitis"  . Lower abdominal pain   . Occult blood in stools   . Peripheral edema   . Seizures ([facility]    one very slight seizure with the stroke; somewhere between 2005-2009  . Stroke ([facility] 2005-2009   "very light;" ; denies residual on 08/19/2018  . UC (ulcerative colitis) ([place] [address]  Past Surgical History:  Procedure Laterality Date  . APPENDECTOMY    . bone density  12/11/10  . BREAST BIOPSY Left 1998; 2019 X 2  . BREAST BIOPSY Right 2009  . BREAST LUMPECTOMY Left 1998  . CATARACT EXTRACTION W/ INTRAOCULAR LENS  IMPLANT, BILATERAL Bilateral   . COLONOSCOPY  02/21/2011  . COLONOSCOPY  10/06/2008  . COLONOSCOPY  12/27/01  . COLONOSCOPY  05/09/05  . COLONOSCOPY N/A 07/02/2016   Procedure: COLONOSCOPY;  Surgeon: [name]  MD;  Location: [facility] END[facility] SUITE;  Service: Endoscopy;  Laterality: N/A;  1055  . [place]   "I was having rectal bleeding"  . FRACTURE SURGERY    . MASTECTOMY Right 2009  . MASTECTOMY COMPLETE / SIMPLE Left 08/19/2018  . PORTACATH PLACEMENT Right 10/15/2018   Procedure: INSERTION PORT-A-CATH RIGHT SUBCLAVIN;  Surgeon: [name], MD;  Location: [facility] ORS;  Service: General;  Laterality: Right;  pt knows to arrive at 7:30  . SIMPLE MASTECTOMY WITH AXILLARY SENTINEL NODE BIOPSY  Left 08/19/2018   Procedure: LEFT SIMPLE MASTECTOMY;  Surgeon: [name], MD;  Location: [facility];  Service: General;  Laterality: Left;  . TONSILLECTOMY    . VENA CAVA FILTER PLACEMENT Right   . WRIST FRACTURE SURGERY Right      SOCIAL HISTORY:  Social History   Socioeconomic History  . Marital status: Widowed    Spouse name: Not on file  . Number of children: 0  . Years of education: Not on file  . Highest education level: Not on file  Occupational History  . Occupation: Optometrist: [employer]  Social Needs  . Financial resource strain: Not hard at all  . Food insecurity:    Worry: Never true    Inability: Never true  . Transportation needs:    Medical: No    Non-medical: No  Tobacco Use  . Smoking status: Former Smoker    Packs/day: 2.00    Years: 40.00    Pack years: 80.00    Types: Cigarettes    Last attempt to quit: 05/05/2001    Years since quitting: 17.6  . Smokeless tobacco: Never Used  Substance and Sexual Activity  . Alcohol use: Yes    Comment: 08/18/2018 "couple drinks/year"  . Drug use: Never  . Sexual activity: Not Currently  Lifestyle  . Physical activity:    Days per week: 0 days    Minutes per session: 0 min  . Stress: Not at all  Relationships  . Social connections:    Talks on phone: More than three times a week    Gets together: More than three times a week    Attends religious service: More than 4 times per year    Active member of club or organization: Yes    Attends meetings of clubs or organizations: More than 4 times per year    Relationship status: Widowed  . Intimate partner violence:    Fear of current or ex partner: No    Emotionally abused: No    Physically abused: No    Forced sexual activity: No  Other Topics Concern  . Not on file  Social History Narrative  . Not on file    FAMILY HISTORY:  Family History  Problem Relation Age of Onset  . Prostate cancer Father   . Colon cancer Neg  Hx     CURRENT MEDICATIONS:  Outpatient Encounter Medications as of 12/24/2018  Medication Sig Note  . acetaminophen (TYLENOL) 500 MG tablet Take 500 mg by mouth every 6 (six) hours as needed for moderate pain or headache.    Marland Kitchen apixaban (ELIQUIS) 5 MG TABS tablet Take 2 tablets (10 mg total) by mouth 2 (two) times daily. Then 1 tablet (5 mg) two times daily starting 03/16/18 (Patient taking differently: Take 2.5 mg by mouth 2 (two) times daily. )   . Ascorbic Acid (VITAMIN C) 1000 MG tablet Take 1,000 mg by mouth daily.   Marland Kitchen  Calcium Carb-Cholecalciferol (CALCIUM 500 + D3) 500-600 MG-UNIT TABS Take 1 tablet by mouth daily.    Marland Kitchen denosumab (PROLIA) 60 MG/ML SOLN injection Inject 60 mg into the skin every 6 (six) months. Administer in upper arm, thigh, or abdomen 10/07/2018: LAST DOSE:09/2018  . HUMIRA PEN 40 MG/0.8ML PNKT INJECT 40 MG SUBQ EVERY 14 DAYS. (Patient taking differently: Inject 40 mg as directed every 14 (fourteen) days. SATURDAYS.)   . lidocaine-prilocaine (EMLA) cream Apply to port site 1 hour prior to appointment 10/07/2018: NEW MEDICATION (PATIENT HAS NOT CHEMOTHERAPY TO DATE)  . Magnesium 500 MG TABS Take 500 mg by mouth daily.   . mesalamine (APRISO) 0.375 g 24 hr capsule Take 4 capsules by mouth each morning.   . Naphazoline-Pheniramine (OPCON-A) 0.027-0.315 % SOLN Place 1 drop into both eyes 3 (three) times daily as needed (dry eyes).  09/21/2018: As needed , rare per patient  . PACLitaxel (TAXOL IV) Inject into the vein once a week. Weekly x 12 cycles starting 10/22/2018 10/07/2018: NEW MEDICATION (PATIENT HAS NOT CHEMOTHERAPY TO DATE)   . polyethylene glycol powder (GLYCOLAX/MIRALAX) powder Take 8.5 g by mouth daily as needed. (Patient taking differently: Take 8.5 g by mouth daily as needed (constipation.). )   . PRESCRIPTION MEDICATION Apply 1 application topically 2 (two) times daily as needed (eczema). Ketoconazole-fluticasone 1:1 compounded cream  08/03/2018: Compounded at [facility]  .  prochlorperazine (COMPAZINE) 10 MG tablet Take 1 tablet (10 mg total) by mouth every 6 (six) hours as needed (Nausea or vomiting). 10/07/2018: NEW MEDICATION (PATIENT HAS NOT CHEMOTHERAPY TO DATE)   . traMADol (ULTRAM) 50 MG tablet Take 1 tablet (50 mg total) by mouth every 6 (six) hours as needed.   . Trastuzumab (HERCEPTIN IV) Inject into the vein every 21 ( twenty-one) days. Every 3 weeks x 1 year starting 10/22/2018 10/07/2018: NEW THERAPY PATIENT HAS NOT STARTED YET   No facility-administered encounter medications on file as of 12/24/2018.     ALLERGIES:  Allergies  Allergen Reactions  . Mercaptopurine Other (See Comments)    Dropped WBC  . Sulfa Antibiotics Other (See Comments)    Extreme Weakness     PHYSICAL EXAM:  ECOG Performance status: 1  Vitals:   12/24/18 0821  BP: (!) 161/59  Pulse: 79  Resp: 16  Temp: 97.7 F (36.5 C)  SpO2: 100%   Filed Weights   12/24/18 0821  Weight: 174 lb 12.8 oz (79.3 kg)    Physical Exam Vitals signs reviewed.  Constitutional:      Appearance: Normal appearance.  Cardiovascular:     Rate and Rhythm: Normal rate and regular rhythm.     Heart sounds: Normal heart sounds.  Pulmonary:     Effort: Pulmonary effort is normal.     Breath sounds: Normal breath sounds.  Abdominal:     General: There is no distension.     Palpations: Abdomen is soft. There is no mass.  Musculoskeletal:        General: No swelling.  Skin:    General: Skin is warm.  Neurological:     General: No focal deficit present.     Mental Status: She is alert and oriented to person, place, and time.  Psychiatric:        Mood and Affect: Mood normal.        Behavior: Behavior normal.      LABORATORY DATA:  I have reviewed the labs as listed.  CBC    Component Value Date/Time  WBC 6.2 12/24/2018 0744   RBC 3.78 (L) 12/24/2018 0744   HGB 11.5 (L) 12/24/2018 0744   HGB 14.4 03/10/2017 1254   HCT 35.7 (L) 12/24/2018 0744   HCT 41.5 03/10/2017 1254   PLT  219 12/24/2018 0744   PLT 249 03/10/2017 1254   MCV 94.4 12/24/2018 0744   MCV 91 03/10/2017 1254   MCH 30.4 12/24/2018 0744   MCHC 32.2 12/24/2018 0744   RDW 15.0 12/24/2018 0744   RDW 13.3 03/10/2017 1254   LYMPHSABS 2.0 12/24/2018 0744   LYMPHSABS 2.7 03/10/2017 1254   MONOABS 0.8 12/24/2018 0744   EOSABS 0.2 12/24/2018 0744   EOSABS 0.4 03/10/2017 1254   BASOSABS 0.0 12/24/2018 0744   BASOSABS 0.1 03/10/2017 1254   CMP Latest Ref Rng & Units 12/24/2018 12/17/2018 12/10/2018  Glucose 70 - 99 mg/dL 109(H) 107(H) 111(H)  BUN 8 - 23 mg/dL 21 20 15   Creatinine 0.44 - 1.00 mg/dL 0.94 0.91 0.95  Sodium 135 - 145 mmol/L 138 138 137  Potassium 3.5 - 5.1 mmol/L 4.3 4.0 3.7  Chloride 98 - 111 mmol/L 107 106 105  CO2 22 - 32 mmol/L 24 25 25   Calcium 8.9 - 10.3 mg/dL 8.6(L) 8.3(L) 8.7(L)  Total Protein 6.5 - 8.1 g/dL 6.4(L) 6.6 6.5  Total Bilirubin 0.3 - 1.2 mg/dL 0.4 0.3 0.5  Alkaline Phos 38 - 126 U/L 29(L) 34(L) 37(L)  AST 15 - 41 U/L 18 18 18   ALT 0 - 44 U/L 19 21 19        DIAGNOSTIC IMAGING:  I have independently reviewed the scans and discussed with the patient.   I have reviewed Venita Lick LPN's note and agree with the documentation.  I personally performed a face-to-face visit, made revisions and my assessment and plan is as follows.    ASSESSMENT & PLAN:   Breast cancer of upper-inner quadrant of left female breast ([place]) 1.  Stage I (PT1CNX) HER-2 positive left breast cancer: - Abnormal mammogram at [facility], followed by ultrasound-guided biopsy on 06/23/2018.  IDC, HER-2 positive, ER/PR positive, Ki 67 2%. - Stereotactic biopsy in [place] on 06/29/2018, of the left breast irregular mass within the posterior upper, slightly inner quadrant.  A coil-shaped clip was placed. 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  Ki 67 was 10%. - Patient had a history of left breast cancer in 1998, status post lumpectomy, followed by  chemotherapy and radiation, 1 year of tamoxifen which was switched to anastrozole for subsequent 4 years due to intolerance. - She had a history of right breast cancer in 2009, status post mastectomy.  She did not receive any adjuvant chemotherapy or radiation. - She was started on anastrozole on 07/21/2018, she took it for 2 weeks and felt very emotional.  She stopped taking it. -Left mastectomy on 08/19/2018, 1.1 cm IDC, grade 1, margins negative.  No lymph nodes were identified.,  ER was 100% positive, PR 7% positive and HER-2 3+ positive.  - I have called and talked to Dr. Claudette Laws (pathologist).  He clarified that there was an initial biopsy done on 06/23/2018, at [facility].  This report is not uploaded on epic.  The initial biopsy was reportedly positive for HER-2. -The second biopsy on 06/29/2018 was negative for HER-2.  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 - The second focus was not clearly identified on the mastectomy specimen. - Adjuvant chemotherapy  with weekly paclitaxel for 12 cycles and Herceptin for 1 year was recommended. -2D echocardiogram on 10/07/2018 shows EF of 60 to 65%. - Weekly paclitaxel and Herceptin started on 10/22/2018. Week 8 of treatment on 12/17/2018. -She had some feet numbness last week which has resolved.  Otherwise she has tolerated last week treatment very well without any GI side effects. -Her energy levels have been slightly low but stable. -We reviewed her blood counts.  She may proceed with week 9 of treatment today.   2.  Osteoporosis: -She has DEXA scan done at [facility].  She receives Prolia every 6 months at Dr. Carmie End office. She was told to continue calcium and vitamin D twice daily.   3.  Recurrent DVT: -She had a history of DVT in 2000's after 1 week post trip to [place], treated with Coumadin until 5 years ago.  This was stopped secondary to bleed complicated by her ulcerative  colitis. -She had a recurrent right leg femoral and popliteal DVT on 03/08/2018 and currently on Eliquis.  No bleeding reported.   4.  Ulcerative colitis: -She is currently on mesalamine and Humira. -She does have constipation alternating with diarrhea from her UC.  This has not worsened since the start of Herceptin and chemotherapy.    Total time spent is 25 minutes with more than 50% of the time spent face-to-face discussing treatment plan, side effects and coordination of care.    Orders placed this encounter:  No orders of the defined types were placed in this encounter.     Derek Jack, MD [place] [phone]

## 2018-12-24 NOTE — Patient Instructions (Addendum)
[facility] at [facility] Discharge Instructions  You were seen today by Dr. Katragadda. He went over your recent lab results. He will see you back in 1 week for labs and follow up.   Thank you for choosing  Cancer Center at [facility] to provide your oncology and hematology care.  To afford each patient quality time with our provider, please arrive at least 15 minutes before your scheduled appointment time.   If you have a lab appointment with the Cancer Center please come in thru the  Main Entrance and check in at the main information desk  You need to re-schedule your appointment should you arrive 10 or more minutes late.  We strive to give you quality time with our providers, and arriving late affects you and other patients whose appointments are after yours.  Also, if you no show three or more times for appointments you may be dismissed from the clinic at the providers discretion.     Again, thank you for choosing [facility].  Our hope is that these requests will decrease the amount of time that you wait before being seen by our physicians.       _____________________________________________________________  Should you have questions after your visit to [facility], please contact our office at [phone] between the hours of 8:00 a.m. and 4:30 p.m.  Voicemails left after 4:00 p.m. will not be returned until the following business day.  For prescription refill requests, have your pharmacy contact our office and allow 72 hours.    Cancer Center Support Programs:   > Cancer Support Group  2nd Tuesday of the month 1pm-2pm, Journey Room    

## 2018-12-24 NOTE — Assessment & Plan Note (Signed)
1.  Stage I (PT1CNX) HER-2 positive left breast cancer: - Abnormal mammogram at [facility], followed by ultrasound-guided biopsy on 06/23/2018.  IDC, HER-2 positive, ER/PR positive, Ki 67 2%. - Stereotactic biopsy in [place] on 06/29/2018, of the left breast irregular mass within the posterior upper, slightly inner quadrant.  A coil-shaped clip was placed. 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  Ki 67 was 10%. - Patient had a history of left breast cancer in 1998, status post lumpectomy, followed by chemotherapy and radiation, 1 year of tamoxifen which was switched to anastrozole for subsequent 4 years due to intolerance. - She had a history of right breast cancer in 2009, status post mastectomy.  She did not receive any adjuvant chemotherapy or radiation. - She was started on anastrozole on 07/21/2018, she took it for 2 weeks and felt very emotional.  She stopped taking it. -Left mastectomy on 08/19/2018, 1.1 cm IDC, grade 1, margins negative.  No lymph nodes were identified.,  ER was 100% positive, PR 7% positive and HER-2 3+ positive.  - I have called and talked to Dr. Claudette Laws (pathologist).  He clarified that there was an initial biopsy done on 06/23/2018, at [facility].  This report is not uploaded on epic.  The initial biopsy was reportedly positive for HER-2. -The second biopsy on 06/29/2018 was negative for HER-2.  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 - The second focus was not clearly identified on the mastectomy specimen. - Adjuvant chemotherapy with weekly paclitaxel for 12 cycles and Herceptin for 1 year was recommended. -2D echocardiogram on 10/07/2018 shows EF of 60 to 65%. - Weekly paclitaxel and Herceptin started on 10/22/2018. Week 8 of treatment on 12/17/2018. -She had some feet numbness last week which has resolved.  Otherwise she has tolerated last week treatment very  well without any GI side effects. -Her energy levels have been slightly low but stable. -We reviewed her blood counts.  She may proceed with week 9 of treatment today.   2.  Osteoporosis: -She has DEXA scan done at [facility].  She receives Prolia every 6 months at Dr. Carmie End office. She was told to continue calcium and vitamin D twice daily.   3.  Recurrent DVT: -She had a history of DVT in 2000's after 1 week post trip to [place], treated with Coumadin until 5 years ago.  This was stopped secondary to bleed complicated by her ulcerative colitis. -She had a recurrent right leg femoral and popliteal DVT on 03/08/2018 and currently on Eliquis.  No bleeding reported.   4.  Ulcerative colitis: -She is currently on mesalamine and Humira. -She does have constipation alternating with diarrhea from her UC.  This has not worsened since the start of Herceptin and chemotherapy.

## 2018-12-24 NOTE — Progress Notes (Signed)
Labs reviewed with MD in office visit today, proceed with treatment today per MD.

## 2018-12-31 ENCOUNTER — Encounter (HOSPITAL_COMMUNITY): Payer: Self-pay | Admitting: Hematology

## 2018-12-31 ENCOUNTER — Inpatient Hospital Stay (HOSPITAL_BASED_OUTPATIENT_CLINIC_OR_DEPARTMENT_OTHER): Payer: Medicare Other | Admitting: Hematology

## 2018-12-31 ENCOUNTER — Inpatient Hospital Stay (HOSPITAL_COMMUNITY): Payer: Medicare Other

## 2018-12-31 ENCOUNTER — Other Ambulatory Visit: Payer: Self-pay

## 2018-12-31 VITALS — BP 134/58 | HR 63 | Temp 98.4°F | Resp 16

## 2018-12-31 DIAGNOSIS — C50212 Malignant neoplasm of upper-inner quadrant of left female breast: Secondary | ICD-10-CM | POA: Diagnosis not present

## 2018-12-31 DIAGNOSIS — M810 Age-related osteoporosis without current pathological fracture: Secondary | ICD-10-CM | POA: Diagnosis not present

## 2018-12-31 DIAGNOSIS — R197 Diarrhea, unspecified: Secondary | ICD-10-CM

## 2018-12-31 DIAGNOSIS — K5190 Ulcerative colitis, unspecified, without complications: Secondary | ICD-10-CM

## 2018-12-31 DIAGNOSIS — Z86718 Personal history of other venous thrombosis and embolism: Secondary | ICD-10-CM

## 2018-12-31 DIAGNOSIS — Z87891 Personal history of nicotine dependence: Secondary | ICD-10-CM

## 2018-12-31 DIAGNOSIS — Z170 Estrogen receptor positive status [ER+]: Secondary | ICD-10-CM | POA: Diagnosis not present

## 2018-12-31 DIAGNOSIS — Z5112 Encounter for antineoplastic immunotherapy: Secondary | ICD-10-CM | POA: Diagnosis not present

## 2018-12-31 DIAGNOSIS — Z8042 Family history of malignant neoplasm of prostate: Secondary | ICD-10-CM

## 2018-12-31 DIAGNOSIS — K5900 Constipation, unspecified: Secondary | ICD-10-CM

## 2018-12-31 DIAGNOSIS — Z7901 Long term (current) use of anticoagulants: Secondary | ICD-10-CM

## 2018-12-31 DIAGNOSIS — Z853 Personal history of malignant neoplasm of breast: Secondary | ICD-10-CM

## 2018-12-31 LAB — CBC WITH DIFFERENTIAL/PLATELET
Abs Immature Granulocytes: 0.04 10*3/uL (ref 0.00–0.07)
Basophils Absolute: 0 10*3/uL (ref 0.0–0.1)
Basophils Relative: 1 %
Eosinophils Absolute: 0.2 10*3/uL (ref 0.0–0.5)
Eosinophils Relative: 3 %
HCT: 35.8 % — ABNORMAL LOW (ref 36.0–46.0)
Hemoglobin: 11.7 g/dL — ABNORMAL LOW (ref 12.0–15.0)
Immature Granulocytes: 1 %
Lymphocytes Relative: 31 %
Lymphs Abs: 2 10*3/uL (ref 0.7–4.0)
MCH: 30.8 pg (ref 26.0–34.0)
MCHC: 32.7 g/dL (ref 30.0–36.0)
MCV: 94.2 fL (ref 80.0–100.0)
Monocytes Absolute: 0.7 10*3/uL (ref 0.1–1.0)
Monocytes Relative: 12 %
Neutro Abs: 3.5 10*3/uL (ref 1.7–7.7)
Neutrophils Relative %: 52 %
Platelets: 207 10*3/uL (ref 150–400)
RBC: 3.8 MIL/uL — ABNORMAL LOW (ref 3.87–5.11)
RDW: 14.9 % (ref 11.5–15.5)
WBC: 6.4 10*3/uL (ref 4.0–10.5)
nRBC: 0 % (ref 0.0–0.2)

## 2018-12-31 LAB — COMPREHENSIVE METABOLIC PANEL
ALT: 18 U/L (ref 0–44)
AST: 16 U/L (ref 15–41)
Albumin: 3.5 g/dL (ref 3.5–5.0)
Alkaline Phosphatase: 27 U/L — ABNORMAL LOW (ref 38–126)
Anion gap: 9 (ref 5–15)
BUN: 16 mg/dL (ref 8–23)
CO2: 24 mmol/L (ref 22–32)
Calcium: 8.8 mg/dL — ABNORMAL LOW (ref 8.9–10.3)
Chloride: 106 mmol/L (ref 98–111)
Creatinine, Ser: 0.94 mg/dL (ref 0.44–1.00)
GFR calc Af Amer: >60 mL/min (ref >60)
GFR calc non Af Amer: 57 mL/min — ABNORMAL LOW (ref >60)
Glucose, Bld: 102 mg/dL — ABNORMAL HIGH (ref 70–99)
Potassium: 3.9 mmol/L (ref 3.5–5.1)
Sodium: 139 mmol/L (ref 135–145)
Total Bilirubin: 0.4 mg/dL (ref 0.3–1.2)
Total Protein: 6.6 g/dL (ref 6.5–8.1)

## 2018-12-31 MED ORDER — ACETAMINOPHEN 325 MG PO TABS
650.0000 mg | ORAL_TABLET | Freq: Once | ORAL | Status: AC
  Administered 2018-12-31: 650 mg via ORAL
  Filled 2018-12-31: qty 2

## 2018-12-31 MED ORDER — SODIUM CHLORIDE 0.9 % IV SOLN
40.0000 mg/m2 | Freq: Once | INTRAVENOUS | Status: AC
  Administered 2018-12-31: 72 mg via INTRAVENOUS
  Filled 2018-12-31: qty 12

## 2018-12-31 MED ORDER — SODIUM CHLORIDE 0.9 % IV SOLN
20.0000 mg | Freq: Once | INTRAVENOUS | Status: AC
  Administered 2018-12-31: 20 mg via INTRAVENOUS
  Filled 2018-12-31: qty 20

## 2018-12-31 MED ORDER — SODIUM CHLORIDE 0.9 % IV SOLN
Freq: Once | INTRAVENOUS | Status: AC
  Administered 2018-12-31: 09:00:00 via INTRAVENOUS

## 2018-12-31 MED ORDER — FAMOTIDINE IN NACL 20-0.9 MG/50ML-% IV SOLN
20.0000 mg | Freq: Once | INTRAVENOUS | Status: AC
  Administered 2018-12-31: 20 mg via INTRAVENOUS
  Filled 2018-12-31: qty 50

## 2018-12-31 MED ORDER — DIPHENHYDRAMINE HCL 50 MG/ML IJ SOLN
25.0000 mg | Freq: Once | INTRAMUSCULAR | Status: AC
  Administered 2018-12-31: 25 mg via INTRAVENOUS
  Filled 2018-12-31: qty 1

## 2018-12-31 MED ORDER — HEPARIN SOD (PORK) LOCK FLUSH 100 UNIT/ML IV SOLN
500.0000 [IU] | Freq: Once | INTRAVENOUS | Status: AC | PRN
  Administered 2018-12-31: 500 [IU]

## 2018-12-31 MED ORDER — TRASTUZUMAB CHEMO 150 MG IV SOLR
150.0000 mg | Freq: Once | INTRAVENOUS | Status: AC
  Administered 2018-12-31: 150 mg via INTRAVENOUS
  Filled 2018-12-31: qty 7.14

## 2018-12-31 MED ORDER — SODIUM CHLORIDE 0.9% FLUSH
10.0000 mL | INTRAVENOUS | Status: DC | PRN
  Administered 2018-12-31: 10 mL
  Filled 2018-12-31: qty 10

## 2018-12-31 NOTE — Patient Instructions (Signed)
[facility] Discharge Instructions for Patients Receiving Chemotherapy   Beginning January 23rd 2017 lab work for the [facility] will be done in the  Main lab at [facility] on 1st floor. If you have a lab appointment with the [facility] please come in thru the  Main Entrance and check in at the main information desk   Today you received the following chemotherapy agents Taxol and Herceptin. Follow-up as scheduled. Call clinic for any questions or concerns  To help prevent nausea and vomiting after your treatment, we encourage you to take your nausea medication   If you develop nausea and vomiting, or diarrhea that is not controlled by your medication, call the clinic.  The clinic phone number is [phone]. Office hours are Monday-Friday 8:30am-5:00pm.  BELOW ARE SYMPTOMS THAT SHOULD BE REPORTED IMMEDIATELY:  *FEVER GREATER THAN 101.0 F  *CHILLS WITH OR WITHOUT FEVER  NAUSEA AND VOMITING THAT IS NOT CONTROLLED WITH YOUR NAUSEA MEDICATION  *UNUSUAL SHORTNESS OF BREATH  *UNUSUAL BRUISING OR BLEEDING  TENDERNESS IN MOUTH AND THROAT WITH OR WITHOUT PRESENCE OF ULCERS  *URINARY PROBLEMS  *BOWEL PROBLEMS  UNUSUAL RASH Items with * indicate a potential emergency and should be followed up as soon as possible. If you have an emergency after office hours please contact your primary care physician or go to the nearest emergency department.  Please call the clinic during office hours if you have any questions or concerns.   You may also contact the Patient Navigator at [phone] should you have any questions or need assistance in obtaining follow up care.      Resources For Cancer Patients and their Caregivers ? American Cancer Society: Can assist with transportation, wigs, general needs, runs Look Good Feel Better.        709-479-5217 ? Cancer Care: Provides financial assistance, online support groups, medication/co-pay assistance.   1-800-813-HOPE 520 003 3920) ? [facility] Assists [place] cancer patients and their families through emotional , educational and financial support.  (212) 279-7252 ? [place] DSS Where to apply for food stamps, Medicaid and utility assistance. 512-444-6034 ? [facility]: Transportation to medical appointments. (406) 125-1235 ? Social Security Administration: May apply for disability if have a Stage IV cancer. (442) 291-8474 6042599036 ? [employer], Disability and Transit Services: Assists with nutrition, care and transit needs. 847-518-6763

## 2018-12-31 NOTE — Patient Instructions (Signed)
[facility] at [facility] Discharge Instructions  You were seen today by Dr. Katragadda. He went over your recent lab results. He will see you back in 1 week for labs, treatment and follow up.   Thank you for choosing [facility] at [facility] to provide your oncology and hematology care.  To afford each patient quality time with our provider, please arrive at least 15 minutes before your scheduled appointment time.   If you have a lab appointment with the Cancer Center please come in thru the  Main Entrance and check in at the main information desk  You need to re-schedule your appointment should you arrive 10 or more minutes late.  We strive to give you quality time with our providers, and arriving late affects you and other patients whose appointments are after yours.  Also, if you no show three or more times for appointments you may be dismissed from the clinic at the providers discretion.     Again, thank you for choosing [facility].  Our hope is that these requests will decrease the amount of time that you wait before being seen by our physicians.       _____________________________________________________________  Should you have questions after your visit to [facility], please contact our office at [phone] between the hours of 8:00 a.m. and 4:30 p.m.  Voicemails left after 4:00 p.m. will not be returned until the following business day.  For prescription refill requests, have your pharmacy contact our office and allow 72 hours.    Cancer Center Support Programs:   > Cancer Support Group  2nd Tuesday of the month 1pm-2pm, Journey Room    

## 2018-12-31 NOTE — Progress Notes (Signed)
0825 Labs reviewed with and pt seen by Dr. Delton Coombes and pt approved for chemo tx today per MD                                                                                     Monique Day tolerated chemo tx well without complaints or incident. VSS upon discharge. Pt discharged self ambulatory in satisfactory condition

## 2018-12-31 NOTE — Assessment & Plan Note (Signed)
1.  Stage I (PT1CNX) HER-2 positive left breast cancer: - Abnormal mammogram at [facility], followed by ultrasound-guided biopsy on 06/23/2018.  IDC, HER-2 positive, ER/PR positive, Ki 67 2%. - Stereotactic biopsy in [place] on 06/29/2018, of the left breast irregular mass within the posterior upper, slightly inner quadrant.  A coil-shaped clip was placed. 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  Ki 67 was 10%. - Patient had a history of left breast cancer in 1998, status post lumpectomy, followed by chemotherapy and radiation, 1 year of tamoxifen which was switched to anastrozole for subsequent 4 years due to intolerance. - She had a history of right breast cancer in 2009, status post mastectomy.  She did not receive any adjuvant chemotherapy or radiation. - She was started on anastrozole on 07/21/2018, she took it for 2 weeks and felt very emotional.  She stopped taking it. -Left mastectomy on 08/19/2018, 1.1 cm IDC, grade 1, margins negative.  No lymph nodes were identified.,  ER was 100% positive, PR 7% positive and HER-2 3+ positive.  - I have called and talked to Dr. Claudette Laws (pathologist).  He clarified that there was an initial biopsy done on 06/23/2018, at [facility].  This report is not uploaded on epic.  The initial biopsy was reportedly positive for HER-2. -The second biopsy on 06/29/2018 was negative for HER-2.  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 - The second focus was not clearly identified on the mastectomy specimen. - Adjuvant chemotherapy with weekly paclitaxel for 12 cycles and Herceptin for 1 year was recommended. -2D echocardiogram on 10/07/2018 shows EF of 60 to 65%. - Weekly paclitaxel and Herceptin started on 10/22/2018. -Week 9 of treatment on 12/24/2018. -She had slight mouth ulcers on the tongue.  Denied any GI symptoms.  Denied any tingling or numbness in  extremities. -Her appetite has been good and energy levels are stable. -We reviewed her blood counts.  She may proceed with week 10 treatment today.   2.  Osteoporosis: -She has DEXA scan done at [facility].  She receives Prolia every 6 months at Dr. Carmie End office. -She will continue calcium and vitamin D twice daily.  3.  Recurrent DVT: -She had a history of DVT in 2000's after 1 week post trip to [place], treated with Coumadin until 5 years ago.  This was stopped secondary to bleed complicated by her ulcerative colitis. -She had a recurrent right leg femoral and popliteal DVT on 03/08/2018 and currently on Eliquis.  No bleeding reported.   4.  Ulcerative colitis: -She is currently on mesalamine and Humira. -She does have constipation alternating with diarrhea from her UC.  This has not worsened since the start of Herceptin and chemotherapy.

## 2018-12-31 NOTE — Progress Notes (Signed)
Monique Day, [place] [address]   CLINIC:  Medical Oncology/Hematology  PCP:  Monique Graves, DO [address] [place] [address] [phone]   REASON FOR VISIT:  Follow-up for left breast cancer    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 breast ([place])   07/21/2018 Initial Diagnosis    Breast cancer of upper-inner quadrant of left female breast ([place])    10/22/2018 -  Chemotherapy    The patient had trastuzumab (HERCEPTIN) 300 mg in sodium chloride 0.9 % 250 mL chemo infusion, 315 mg, Intravenous,  Once, 3 of 16 cycles Administration: 300 mg (10/22/2018), 150 mg (10/29/2018), 150 mg (11/19/2018), 150 mg (11/05/2018), 150 mg (11/12/2018), 150 mg (12/01/2018), 150 mg (12/10/2018), 150 mg (12/17/2018), 150 mg (12/24/2018) PACLitaxel (TAXOL) 72 mg in sodium chloride 0.9 % 150 mL chemo infusion (</= 67m/m2), 40 mg/m2 = 72 mg (50 % of original dose 80 mg/m2), Intravenous,  Once, 3 of 3 cycles Dose modification: 40 mg/m2 (50 % of original dose 80 mg/m2, Cycle 1, Reason: Provider Judgment) Administration: 72 mg (10/22/2018), 72 mg (10/29/2018), 72 mg (11/19/2018), 72 mg (11/05/2018), 72 mg (11/12/2018), 72 mg (12/01/2018), 72 mg (12/10/2018), 72 mg (12/17/2018), 72 mg (12/24/2018)  for chemotherapy treatment.       CANCER STAGING: Cancer Staging No matching staging information was found for the patient.   INTERVAL HISTORY:  Ms. [id number]. female returns for follow-up of week 147of chemotherapy with Herceptin.  She received week 9 on 12/24/2018.  Denies any nausea vomiting, diarrhea or constipation.  Tolerating chemotherapy very well.  Denies any ER visits or hospitalizations.  Appetite has been good.  Denies any bleeding per rectum or melena.  Denies any symptoms of PND or orthopnea.  Appetite is 100% and energy levels are 75%.    REVIEW OF SYSTEMS:  Review of Systems  All other systems reviewed and are  negative.    PAST MEDICAL/SURGICAL HISTORY:  Past Medical History:  Diagnosis Date  . Adult idiopathic generalized osteoporosis   . Arthritis    "minor" (08/18/2018)  . Breast cancer, left breast ([place] 1998   lumpectomy  . Breast cancer, right breast ([place] 2009   lumpectomy; mastectomy  . DVT (deep venous thrombosis) ([place] "early 2000's"   RLE  . DVT (deep venous thrombosis) ([place]   . Hemorrhoids, external   . History of blood transfusion    "related to ulcerative colitis"  . Lower abdominal pain   . Occult blood in stools   . Peripheral edema   . Seizures ([facility]    one very slight seizure with the stroke; somewhere between 2005-2009  . Stroke ([facility] 2005-2009   "very light;" ; denies residual on 08/19/2018  . UC (ulcerative colitis) ([place] [address]  Past Surgical History:  Procedure Laterality Date  . APPENDECTOMY    . bone density  12/11/10  . BREAST BIOPSY Left 1998; 2019 X 2  . BREAST BIOPSY Right 2009  . BREAST LUMPECTOMY Left 1998  . CATARACT EXTRACTION W/ INTRAOCULAR LENS  IMPLANT, BILATERAL Bilateral   . COLONOSCOPY  02/21/2011  . COLONOSCOPY  10/06/2008  . COLONOSCOPY  12/27/01  . COLONOSCOPY  05/09/05  . COLONOSCOPY N/A 07/02/2016   Procedure: COLONOSCOPY;  Surgeon: [name] MD;  Location: [facility] SUITE;  Service: Endoscopy;  Laterality: N/A;  1055  . [place]  "I was having  rectal bleeding"  . FRACTURE SURGERY    . MASTECTOMY Right 2009  . MASTECTOMY COMPLETE / SIMPLE Left 08/19/2018  . PORTACATH PLACEMENT Right 10/15/2018   Procedure: INSERTION PORT-A-CATH RIGHT SUBCLAVIN;  Surgeon: [name], MD;  Location: [facility] ORS;  Service: General;  Laterality: Right;  pt knows to arrive at 7:30  . SIMPLE MASTECTOMY WITH AXILLARY SENTINEL NODE BIOPSY Left 08/19/2018   Procedure: LEFT SIMPLE MASTECTOMY;  Surgeon: [name], MD;  Location: [facility];  Service: General;  Laterality: Left;  . TONSILLECTOMY    . VENA CAVA FILTER PLACEMENT Right    . WRIST FRACTURE SURGERY Right      SOCIAL HISTORY:  Social History   Socioeconomic History  . Marital status: Widowed    Spouse name: Not on file  . Number of children: 0  . Years of education: Not on file  . Highest education level: Not on file  Occupational History  . Occupation: Optometrist: [employer]  Social Needs  . Financial resource strain: Not hard at all  . Food insecurity:    Worry: Never true    Inability: Never true  . Transportation needs:    Medical: No    Non-medical: No  Tobacco Use  . Smoking status: Former Smoker    Packs/day: 2.00    Years: 40.00    Pack years: 80.00    Types: Cigarettes    Last attempt to quit: 05/05/2001    Years since quitting: 17.6  . Smokeless tobacco: Never Used  Substance and Sexual Activity  . Alcohol use: Yes    Comment: 08/18/2018 "couple drinks/year"  . Drug use: Never  . Sexual activity: Not Currently  Lifestyle  . Physical activity:    Days per week: 0 days    Minutes per session: 0 min  . Stress: Not at all  Relationships  . Social connections:    Talks on phone: More than three times a week    Gets together: More than three times a week    Attends religious service: More than 4 times per year    Active member of club or organization: Yes    Attends meetings of clubs or organizations: More than 4 times per year    Relationship status: Widowed  . Intimate partner violence:    Fear of current or ex partner: No    Emotionally abused: No    Physically abused: No    Forced sexual activity: No  Other Topics Concern  . Not on file  Social History Narrative  . Not on file    FAMILY HISTORY:  Family History  Problem Relation Age of Onset  . Prostate cancer Father   . Colon cancer Neg Hx     CURRENT MEDICATIONS:  Outpatient Encounter Medications as of 12/31/2018  Medication Sig Note  . acetaminophen (TYLENOL) 500 MG tablet Take 500 mg by mouth every 6 (six) hours as  needed for moderate pain or headache.    Marland Kitchen apixaban (ELIQUIS) 5 MG TABS tablet Take 2 tablets (10 mg total) by mouth 2 (two) times daily. Then 1 tablet (5 mg) two times daily starting 03/16/18 (Patient taking differently: Take 2.5 mg by mouth 2 (two) times daily. )   . Ascorbic Acid (VITAMIN C) 1000 MG tablet Take 1,000 mg by mouth daily.   . Calcium Carb-Cholecalciferol (CALCIUM 500 + D3) 500-600 MG-UNIT TABS Take 1 tablet by mouth daily.    Marland Kitchen denosumab (PROLIA) 60 MG/ML SOLN  injection Inject 60 mg into the skin every 6 (six) months. Administer in upper arm, thigh, or abdomen 10/07/2018: LAST DOSE:09/2018  . ELIQUIS 2.5 MG TABS tablet    . HUMIRA PEN 40 MG/0.8ML PNKT INJECT 40 MG SUBQ EVERY 14 DAYS. (Patient taking differently: Inject 40 mg as directed every 14 (fourteen) days. SATURDAYS.)   . lidocaine-prilocaine (EMLA) cream Apply to port site 1 hour prior to appointment 10/07/2018: NEW MEDICATION (PATIENT HAS NOT CHEMOTHERAPY TO DATE)  . Magnesium 500 MG TABS Take 500 mg by mouth daily.   . mesalamine (APRISO) 0.375 g 24 hr capsule Take 4 capsules by mouth each morning.   . Naphazoline-Pheniramine (OPCON-A) 0.027-0.315 % SOLN Place 1 drop into both eyes 3 (three) times daily as needed (dry eyes).  09/21/2018: As needed , rare per patient  . PACLitaxel (TAXOL IV) Inject into the vein once a week. Weekly x 12 cycles starting 10/22/2018 10/07/2018: NEW MEDICATION (PATIENT HAS NOT CHEMOTHERAPY TO DATE)   . polyethylene glycol powder (GLYCOLAX/MIRALAX) powder Take 8.5 g by mouth daily as needed. (Patient taking differently: Take 8.5 g by mouth daily as needed (constipation.). )   . PRESCRIPTION MEDICATION Apply 1 application topically 2 (two) times daily as needed (eczema). Ketoconazole-fluticasone 1:1 compounded cream  08/03/2018: Compounded at [facility]  . prochlorperazine (COMPAZINE) 10 MG tablet Take 1 tablet (10 mg total) by mouth every 6 (six) hours as needed (Nausea or vomiting). 10/07/2018: NEW MEDICATION  (PATIENT HAS NOT CHEMOTHERAPY TO DATE)   . traMADol (ULTRAM) 50 MG tablet Take 1 tablet (50 mg total) by mouth every 6 (six) hours as needed.   . Trastuzumab (HERCEPTIN IV) Inject into the vein every 21 ( twenty-one) days. Every 3 weeks x 1 year starting 10/22/2018 10/07/2018: NEW THERAPY PATIENT HAS NOT STARTED YET   No facility-administered encounter medications on file as of 12/31/2018.     ALLERGIES:  Allergies  Allergen Reactions  . Mercaptopurine Other (See Comments)    Dropped WBC  . Sulfa Antibiotics Other (See Comments)    Extreme Weakness     PHYSICAL EXAM:  ECOG Performance status: 1  Vitals:   12/31/18 0801  BP: (!) 149/76  Pulse: 71  Resp: 18  Temp: 98.2 F (36.8 C)  SpO2: 100%   Filed Weights   12/31/18 0801  Weight: 176 lb 9.6 oz (80.1 kg)    Physical Exam Vitals signs reviewed.  Constitutional:      Appearance: Normal appearance.  Cardiovascular:     Rate and Rhythm: Normal rate and regular rhythm.     Heart sounds: Normal heart sounds.  Pulmonary:     Effort: Pulmonary effort is normal.     Breath sounds: Normal breath sounds.  Abdominal:     General: There is no distension.     Palpations: Abdomen is soft. There is no mass.  Musculoskeletal:        General: No swelling.  Skin:    General: Skin is warm.  Neurological:     General: No focal deficit present.     Mental Status: She is alert and oriented to person, place, and time.  Psychiatric:        Mood and Affect: Mood normal.        Behavior: Behavior normal.      LABORATORY DATA:  I have reviewed the labs as listed.  CBC    Component Value Date/Time   WBC 6.4 12/31/2018 0749   RBC 3.80 (L) 12/31/2018 0749   HGB 11.7 (  L) 12/31/2018 0749   HGB 14.4 03/10/2017 1254   HCT 35.8 (L) 12/31/2018 0749   HCT 41.5 03/10/2017 1254   PLT 207 12/31/2018 0749   PLT 249 03/10/2017 1254   MCV 94.2 12/31/2018 0749   MCV 91 03/10/2017 1254   MCH 30.8 12/31/2018 0749   MCHC 32.7 12/31/2018 0749    RDW 14.9 12/31/2018 0749   RDW 13.3 03/10/2017 1254   LYMPHSABS 2.0 12/31/2018 0749   LYMPHSABS 2.7 03/10/2017 1254   MONOABS 0.7 12/31/2018 0749   EOSABS 0.2 12/31/2018 0749   EOSABS 0.4 03/10/2017 1254   BASOSABS 0.0 12/31/2018 0749   BASOSABS 0.1 03/10/2017 1254   CMP Latest Ref Rng & Units 12/31/2018 12/24/2018 12/17/2018  Glucose 70 - 99 mg/dL 102(H) 109(H) 107(H)  BUN 8 - 23 mg/dL _0 Creatinine 0.44 - 1.00 mg/dL 0.94 0.94 0.91  Sodium 135 - 145 mmol/L 139 138 138  Potassium 3.5 - 5.1 mmol/L 3.9 4.3 4.0  Chloride 98 - 111 mmol/L 106 107 106  CO2 22 - 32 mmol/L _1 Calcium 8.9 - 10.3 mg/dL 8.8(L) 8.6(L) 8.3(L)  Total Protein 6.5 - 8.1 g/dL 6.6 6.4(L) 6.6  Total Bilirubin 0.3 - 1.2 mg/dL 0.4 0.4 0.3  Alkaline Phos 38 - 126 U/L 27(L) 29(L) 34(L)  AST 15 - 41 U/L _2 ALT 0 - 44 U/L _3 DIAGNOSTIC IMAGING:  I have independently reviewed the scans and discussed with the patient.     ASSESSMENT & PLAN:   Breast cancer of upper-inner quadrant of left female breast ([place]) 1.  Stage I (PT1CNX) HER-2 positive left breast cancer: - Abnormal mammogram at [facility], followed by ultrasound-guided biopsy on 06/23/2018.  IDC, HER-2 positive, ER/PR positive, Ki 67 2%. - Stereotactic biopsy in [place] on 06/29/2018, of the left breast irregular mass within the posterior upper, slightly inner quadrant.  A coil-shaped clip was placed. 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  Ki 67 was 10%. - Patient had a history of left breast cancer in 1998, status post lumpectomy, followed by chemotherapy and radiation, 1 year of tamoxifen which was switched to anastrozole for subsequent 4 years due to intolerance. - She had a history of right breast cancer in 2009, status post mastectomy.  She did not receive any adjuvant chemotherapy or radiation. - She was started on anastrozole on 07/21/2018, she took it for 2 weeks and  felt very emotional.  She stopped taking it. -Left mastectomy on 08/19/2018, 1.1 cm IDC, grade 1, margins negative.  No lymph nodes were identified.,  ER was 100% positive, PR 7% positive and HER-2 3+ positive.  - I have called and talked to Dr. Claudette Laws (pathologist).  He clarified that there was an initial biopsy done on 06/23/2018, at [facility].  This report is not uploaded on epic.  The initial biopsy was reportedly positive for HER-2. -The second biopsy on 06/29/2018 was negative for HER-2.  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 - The second focus was not clearly identified on the mastectomy specimen. - Adjuvant chemotherapy with weekly paclitaxel for 12 cycles and Herceptin for 1 year was recommended. -2D echocardiogram on 10/07/2018 shows EF of 60 to 65%. - Weekly paclitaxel and Herceptin started on 10/22/2018. -Week 9 of treatment on 12/24/2018. -She had slight mouth ulcers on the tongue.  Denied any GI symptoms.  Denied any tingling or numbness in extremities. -Her appetite has been good and energy levels are stable. -We reviewed her blood counts.  She may proceed with week 10 treatment today.   2.  Osteoporosis: -She has D[facility]A scan done at [facility].  She receives Prolia every 6 months at Dr. Carmie End office. -She will continue calcium and vitamin D twice daily.  3.  Recurrent DVT: -She had a history of DVT in 2000's after 1 week post trip to [place], treated with Coumadin until 5 years ago.  This was stopped secondary to bleed complicated by her ulcerative colitis. -She had a recurrent right leg femoral and popliteal DVT on 03/08/2018 and currently on Eliquis.  No bleeding reported.   4.  Ulcerative colitis: -She is currently on mesalamine and Humira. -She does have constipation alternating with diarrhea from her UC.  This has not worsened since the start of Herceptin and chemotherapy.    Total time spent is 25 minutes with  more than 50% of the time spent face-to-face discussing treatment plan, side effects and coordination of care.    Orders placed this encounter:  No orders of the defined types were placed in this encounter.     Derek Jack, MD [facility] [phone]

## 2019-01-07 ENCOUNTER — Encounter (HOSPITAL_COMMUNITY): Payer: Self-pay

## 2019-01-07 ENCOUNTER — Inpatient Hospital Stay (HOSPITAL_COMMUNITY): Payer: Medicare Other

## 2019-01-07 ENCOUNTER — Encounter (HOSPITAL_COMMUNITY): Payer: Self-pay | Admitting: Hematology

## 2019-01-07 ENCOUNTER — Other Ambulatory Visit: Payer: Self-pay

## 2019-01-07 ENCOUNTER — Inpatient Hospital Stay (HOSPITAL_BASED_OUTPATIENT_CLINIC_OR_DEPARTMENT_OTHER): Payer: Medicare Other | Admitting: Hematology

## 2019-01-07 VITALS — BP 163/59 | HR 75 | Temp 98.3°F | Resp 16 | Wt 178.0 lb

## 2019-01-07 VITALS — BP 146/64 | HR 64 | Temp 98.6°F | Resp 16

## 2019-01-07 DIAGNOSIS — C50212 Malignant neoplasm of upper-inner quadrant of left female breast: Secondary | ICD-10-CM | POA: Diagnosis not present

## 2019-01-07 DIAGNOSIS — M810 Age-related osteoporosis without current pathological fracture: Secondary | ICD-10-CM | POA: Diagnosis not present

## 2019-01-07 DIAGNOSIS — Z170 Estrogen receptor positive status [ER+]: Secondary | ICD-10-CM

## 2019-01-07 DIAGNOSIS — Z87891 Personal history of nicotine dependence: Secondary | ICD-10-CM

## 2019-01-07 DIAGNOSIS — Z8042 Family history of malignant neoplasm of prostate: Secondary | ICD-10-CM

## 2019-01-07 DIAGNOSIS — K5900 Constipation, unspecified: Secondary | ICD-10-CM

## 2019-01-07 DIAGNOSIS — K5190 Ulcerative colitis, unspecified, without complications: Secondary | ICD-10-CM | POA: Diagnosis not present

## 2019-01-07 DIAGNOSIS — Z5112 Encounter for antineoplastic immunotherapy: Secondary | ICD-10-CM | POA: Diagnosis not present

## 2019-01-07 DIAGNOSIS — Z853 Personal history of malignant neoplasm of breast: Secondary | ICD-10-CM

## 2019-01-07 DIAGNOSIS — R197 Diarrhea, unspecified: Secondary | ICD-10-CM

## 2019-01-07 DIAGNOSIS — Z79899 Other long term (current) drug therapy: Secondary | ICD-10-CM

## 2019-01-07 DIAGNOSIS — Z7901 Long term (current) use of anticoagulants: Secondary | ICD-10-CM

## 2019-01-07 DIAGNOSIS — Z86718 Personal history of other venous thrombosis and embolism: Secondary | ICD-10-CM

## 2019-01-07 LAB — COMPREHENSIVE METABOLIC PANEL
ALT: 18 U/L (ref 0–44)
AST: 16 U/L (ref 15–41)
Albumin: 3.3 g/dL — ABNORMAL LOW (ref 3.5–5.0)
Alkaline Phosphatase: 28 U/L — ABNORMAL LOW (ref 38–126)
Anion gap: 8 (ref 5–15)
BUN: 22 mg/dL (ref 8–23)
CO2: 24 mmol/L (ref 22–32)
Calcium: 8.6 mg/dL — ABNORMAL LOW (ref 8.9–10.3)
Chloride: 105 mmol/L (ref 98–111)
Creatinine, Ser: 0.87 mg/dL (ref 0.44–1.00)
GFR calc Af Amer: >60 mL/min (ref >60)
GFR calc non Af Amer: >60 mL/min (ref >60)
Glucose, Bld: 107 mg/dL — ABNORMAL HIGH (ref 70–99)
Potassium: 4.1 mmol/L (ref 3.5–5.1)
Sodium: 137 mmol/L (ref 135–145)
Total Bilirubin: 0.4 mg/dL (ref 0.3–1.2)
Total Protein: 6 g/dL — ABNORMAL LOW (ref 6.5–8.1)

## 2019-01-07 LAB — CBC WITH DIFFERENTIAL/PLATELET
Abs Immature Granulocytes: 0.06 10*3/uL (ref 0.00–0.07)
Basophils Absolute: 0 10*3/uL (ref 0.0–0.1)
Basophils Relative: 0 %
Eosinophils Absolute: 0.2 10*3/uL (ref 0.0–0.5)
Eosinophils Relative: 2 %
HCT: 34.7 % — ABNORMAL LOW (ref 36.0–46.0)
Hemoglobin: 11.3 g/dL — ABNORMAL LOW (ref 12.0–15.0)
Immature Granulocytes: 1 %
Lymphocytes Relative: 25 %
Lymphs Abs: 1.8 10*3/uL (ref 0.7–4.0)
MCH: 30.8 pg (ref 26.0–34.0)
MCHC: 32.6 g/dL (ref 30.0–36.0)
MCV: 94.6 fL (ref 80.0–100.0)
Monocytes Absolute: 0.7 10*3/uL (ref 0.1–1.0)
Monocytes Relative: 10 %
Neutro Abs: 4.4 10*3/uL (ref 1.7–7.7)
Neutrophils Relative %: 62 %
Platelets: 194 10*3/uL (ref 150–400)
RBC: 3.67 MIL/uL — ABNORMAL LOW (ref 3.87–5.11)
RDW: 15 % (ref 11.5–15.5)
WBC: 7 10*3/uL (ref 4.0–10.5)
nRBC: 0 % (ref 0.0–0.2)

## 2019-01-07 MED ORDER — ACETAMINOPHEN 325 MG PO TABS
ORAL_TABLET | ORAL | Status: AC
  Filled 2019-01-07: qty 2

## 2019-01-07 MED ORDER — SODIUM CHLORIDE 0.9 % IV SOLN
20.0000 mg | Freq: Once | INTRAVENOUS | Status: AC
  Administered 2019-01-07: 10:00:00 20 mg via INTRAVENOUS
  Filled 2019-01-07: qty 20

## 2019-01-07 MED ORDER — SODIUM CHLORIDE 0.9% FLUSH
10.0000 mL | INTRAVENOUS | Status: DC | PRN
  Administered 2019-01-07: 08:00:00 10 mL
  Filled 2019-01-07: qty 10

## 2019-01-07 MED ORDER — HEPARIN SOD (PORK) LOCK FLUSH 100 UNIT/ML IV SOLN
500.0000 [IU] | Freq: Once | INTRAVENOUS | Status: AC | PRN
  Administered 2019-01-07: 13:00:00 500 [IU]

## 2019-01-07 MED ORDER — DIPHENHYDRAMINE HCL 50 MG/ML IJ SOLN
INTRAMUSCULAR | Status: AC
  Filled 2019-01-07: qty 1

## 2019-01-07 MED ORDER — FULVESTRANT 250 MG/5ML IM SOLN
INTRAMUSCULAR | Status: AC
  Filled 2019-01-07: qty 5

## 2019-01-07 MED ORDER — OCTREOTIDE ACETATE 30 MG IM KIT
PACK | INTRAMUSCULAR | Status: AC
  Filled 2019-01-07: qty 1

## 2019-01-07 MED ORDER — SODIUM CHLORIDE 0.9 % IV SOLN
Freq: Once | INTRAVENOUS | Status: AC
  Administered 2019-01-07: 09:00:00 via INTRAVENOUS

## 2019-01-07 MED ORDER — ACETAMINOPHEN 325 MG PO TABS
650.0000 mg | ORAL_TABLET | Freq: Once | ORAL | Status: AC
  Administered 2019-01-07: 09:00:00 650 mg via ORAL

## 2019-01-07 MED ORDER — SODIUM CHLORIDE 0.9 % IV SOLN
40.0000 mg/m2 | Freq: Once | INTRAVENOUS | Status: AC
  Administered 2019-01-07: 72 mg via INTRAVENOUS
  Filled 2019-01-07: qty 12

## 2019-01-07 MED ORDER — DIPHENHYDRAMINE HCL 50 MG/ML IJ SOLN
25.0000 mg | Freq: Once | INTRAMUSCULAR | Status: AC
  Administered 2019-01-07: 09:00:00 25 mg via INTRAVENOUS

## 2019-01-07 MED ORDER — FAMOTIDINE IN NACL 20-0.9 MG/50ML-% IV SOLN
20.0000 mg | Freq: Once | INTRAVENOUS | Status: AC
  Administered 2019-01-07: 09:00:00 20 mg via INTRAVENOUS

## 2019-01-07 MED ORDER — FAMOTIDINE IN NACL 20-0.9 MG/50ML-% IV SOLN
INTRAVENOUS | Status: AC
  Filled 2019-01-07: qty 50

## 2019-01-07 MED ORDER — TRASTUZUMAB CHEMO 150 MG IV SOLR
150.0000 mg | Freq: Once | INTRAVENOUS | Status: AC
  Administered 2019-01-07: 150 mg via INTRAVENOUS
  Filled 2019-01-07: qty 7.14

## 2019-01-07 MED ORDER — OCTREOTIDE ACETATE 20 MG IM KIT
PACK | INTRAMUSCULAR | Status: AC
  Filled 2019-01-07: qty 1

## 2019-01-07 NOTE — Assessment & Plan Note (Signed)
1.  Stage I (PT1CNX) HER-2 positive left breast cancer: - Abnormal mammogram at [facility], followed by ultrasound-guided biopsy on 06/23/2018.  IDC, HER-2 positive, ER/PR positive, Ki 67 2%. - Stereotactic biopsy in [place] on 06/29/2018, of the left breast irregular mass within the posterior upper, slightly inner quadrant.  A coil-shaped clip was placed. 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  Ki 67 was 10%. - Patient had a history of left breast cancer in 1998, status post lumpectomy, followed by chemotherapy and radiation, 1 year of tamoxifen which was switched to anastrozole for subsequent 4 years due to intolerance. - She had a history of right breast cancer in 2009, status post mastectomy.  She did not receive any adjuvant chemotherapy or radiation. - She was started on anastrozole on 07/21/2018, she took it for 2 weeks and felt very emotional.  She stopped taking it. -Left mastectomy on 08/19/2018, 1.1 cm IDC, grade 1, margins negative.  No lymph nodes were identified.,  ER was 100% positive, PR 7% positive and HER-2 3+ positive.  - I have called and talked to Dr. Claudette Laws (pathologist).  He clarified that there was an initial biopsy done on 06/23/2018, at [facility].  This report is not uploaded on epic.  The initial biopsy was reportedly positive for HER-2. -The second biopsy on 06/29/2018 was negative for HER-2.  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 - The second focus was not clearly identified on the mastectomy specimen. - Adjuvant chemotherapy with weekly paclitaxel and Herceptin started on 10/22/2018. -2D echocardiogram on 10/07/2018 shows EF of 60 to 65%. -Week 10 of treatment on 12/31/2018. -He reported more diarrhea since last treatment.  She changed her magnesium preparation and thinks that might have contributed to it.  Denied any tingling or numbness in the extremities.   Denies any mucositis.  No GI symptoms including nausea vomiting reported. -Her appetite and energy levels are very good. -I have reviewed her labs.  She may proceed with week 11 of treatment today.  She will come back next week for week 12. -We will plan to repeat a 2D echocardiogram.  We will switch her Herceptin to every 3 weeks after that.  2.  Osteoporosis: -She has DEXA scan done at [facility].  She receives Prolia every 6 months at Dr. Carmie End office. -She will continue calcium and vitamin D twice daily.  3.  Recurrent DVT: -She had a history of DVT in 2000's after 1 week post trip to [place], treated with Coumadin until 5 years ago.  This was stopped secondary to bleed complicated by her ulcerative colitis. -She had a recurrent right leg femoral and popliteal DVT on 03/08/2018 and currently on Eliquis.  No bleeding reported.  4.  Ulcerative colitis: -She is currently on mesalamine and Humira. -She does have constipation alternating with diarrhea from her UC.  This has not worsened since the start of Herceptin and chemotherapy.

## 2019-01-07 NOTE — Progress Notes (Signed)
Labs reviewed with NP today at office visit. Proceed with treatment today .   Treatment given per orders. Patient tolerated it well without problems. Vitals stable and discharged home from clinic ambulatory. Follow up as scheduled.

## 2019-01-07 NOTE — Progress Notes (Signed)
Monique Day, [place] [address]   CLINIC:  Medical Oncology/Hematology  PCP:  Octavio Graves, DO [address] [place] [address] [phone]   REASON FOR VISIT:  Follow-up for left breast cancer    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 breast ([place])   07/21/2018 Initial Diagnosis    Breast cancer of upper-inner quadrant of left female breast ([place])    10/22/2018 -  Chemotherapy    The patient had trastuzumab (HERCEPTIN) 300 mg in sodium chloride 0.9 % 250 mL chemo infusion, 315 mg, Intravenous,  Once, 3 of 16 cycles Administration: 300 mg (10/22/2018), 150 mg (10/29/2018), 150 mg (11/19/2018), 150 mg (11/05/2018), 150 mg (11/12/2018), 150 mg (12/01/2018), 150 mg (12/10/2018), 150 mg (12/17/2018), 150 mg (12/24/2018), 150 mg (12/31/2018) PACLitaxel (TAXOL) 72 mg in sodium chloride 0.9 % 150 mL chemo infusion (</= 25m/m2), 40 mg/m2 = 72 mg (50 % of original dose 80 mg/m2), Intravenous,  Once, 3 of 3 cycles Dose modification: 40 mg/m2 (50 % of original dose 80 mg/m2, Cycle 1, Reason: Provider Judgment) Administration: 72 mg (10/22/2018), 72 mg (10/29/2018), 72 mg (11/19/2018), 72 mg (11/05/2018), 72 mg (11/12/2018), 72 mg (12/01/2018), 72 mg (12/10/2018), 72 mg (12/17/2018), 72 mg (12/24/2018), 72 mg (12/31/2018)  for chemotherapy treatment.       CANCER STAGING: Cancer Staging No matching staging information was found for the patient.   INTERVAL HISTORY:  [name] female returns for follow-up 1 week 11 of chemotherapy with Herceptin and Taxol.  She received week 10 on 12/31/2018.  She reported more diarrhea with last treatment.  She apparently changed her magnesium pills to 250 mg 2 pills/day.  She denied any nausea or vomiting.  Energy levels are good.  Denies any tingling or numbness extremities.  Denies any symptoms of PND or orthopnea.  Appetite and energy levels are 100%.  Denies any fevers or  infections.    REVIEW OF SYSTEMS:  Review of Systems  Gastrointestinal: Positive for diarrhea.  All other systems reviewed and are negative.    PAST MEDICAL/SURGICAL HISTORY:  Past Medical History:  Diagnosis Date  . Adult idiopathic generalized osteoporosis   . Arthritis    "minor" (08/18/2018)  . Breast cancer, left breast ([name] 1998   lumpectomy  . Breast cancer, right breast ([name] 2009   lumpectomy; mastectomy  . DVT [place]deep venous thrombosis) ([name] "early 2000's"   RLE  . [place]deep venous thrombosis) ([name]   . Hemorrhoids, external   . History of blood transfusion    "related to ulcerative colitis"  . Lower abdominal pain   . Occult blood in stools   . Peripheral edema   . Seizures ([name]    one very slight seizure with the stroke; somewhere between 2005-2009  . Stroke ([name] 2005-2009   "very light;" ; denies residual on 08/19/2018  . UC (ulcerative colitis) ([name] [address]  Past Surgical History:  Procedure Laterality Date  . APPENDECTOMY    . bone density  12/11/10  . BREAST BIOPSY Left 1998; 2019 X 2  . BREAST BIOPSY Right 2009  . BREAST LUMPECTOMY Left 1998  . CATARACT EXTRACTION W/ INTRAOCULAR LENS  IMPLANT, BILATERAL Bilateral   . COLONOSCOPY  02/21/2011  . COLONOSCOPY  10/06/2008  . COLONOSCOPY  12/27/01  . COLONOSCOPY  05/09/05  . COLONOSCOPY N/A 07/02/2016   Procedure: COLONOSCOPY;  Surgeon: [name] MD;  Location: [facility] SUITE;  Service: Endoscopy;  Laterality: N/A;  1055  . [place]   "I was having rectal bleeding"  . FRACTURE SURGERY    . MASTECTOMY Right 2009  . MASTECTOMY COMPLETE / SIMPLE Left 08/19/2018  . PORTACATH PLACEMENT Right 10/15/2018   Procedure: INSERTION PORT-A-CATH RIGHT SUBCLAVIN;  Surgeon: [name], MD;  Location: [facility] ORS;  Service: General;  Laterality: Right;  pt knows to arrive at 7:30  . SIMPLE MASTECTOMY WITH AXILLARY SENTINEL NODE BIOPSY Left 08/19/2018   Procedure: LEFT SIMPLE MASTECTOMY;   Surgeon: [name], MD;  Location: [facility];  Service: General;  Laterality: Left;  . TONSILLECTOMY    . VENA CAVA FILTER PLACEMENT Right   . WRIST FRACTURE SURGERY Right      SOCIAL HISTORY:  Social History   Socioeconomic History  . Marital status: Widowed    Spouse name: Not on file  . Number of children: 0  . Years of education: Not on file  . Highest education level: Not on file  Occupational History  . Occupation: Optometrist: [employer]  Social Needs  . Financial resource strain: Not hard at all  . Food insecurity:    Worry: Never true    Inability: Never true  . Transportation needs:    Medical: No    Non-medical: No  Tobacco Use  . Smoking status: Former Smoker    Packs/day: 2.00    Years: 40.00    Pack years: 80.00    Types: Cigarettes    Last attempt to quit: 05/05/2001    Years since quitting: 17.6  . Smokeless tobacco: Never Used  Substance and Sexual Activity  . Alcohol use: Yes    Comment: 08/18/2018 "couple drinks/year"  . Drug use: Never  . Sexual activity: Not Currently  Lifestyle  . Physical activity:    Days per week: 0 days    Minutes per session: 0 min  . Stress: Not at all  Relationships  . Social connections:    Talks on phone: More than three times a week    Gets together: More than three times a week    Attends religious service: More than 4 times per year    Active member of club or organization: Yes    Attends meetings of clubs or organizations: More than 4 times per year    Relationship status: Widowed  . Intimate partner violence:    Fear of current or ex partner: No    Emotionally abused: No    Physically abused: No    Forced sexual activity: No  Other Topics Concern  . Not on file  Social History Narrative  . Not on file    FAMILY HISTORY:  Family History  Problem Relation Age of Onset  . Prostate cancer Father   . Colon cancer Neg Hx     CURRENT MEDICATIONS:  Outpatient  Encounter Medications as of 01/07/2019  Medication Sig Note  . acetaminophen (TYLENOL) 500 MG tablet Take 500 mg by mouth every 6 (six) hours as needed for moderate pain or headache.    Marland Kitchen apixaban (ELIQUIS) 5 MG TABS tablet Take 2 tablets (10 mg total) by mouth 2 (two) times daily. Then 1 tablet (5 mg) two times daily starting 03/16/18 (Patient taking differently: Take 2.5 mg by mouth 2 (two) times daily. )   . Ascorbic Acid (VITAMIN C) 1000 MG tablet Take 1,000 mg by mouth daily.   . Calcium  Carb-Cholecalciferol (CALCIUM 500 + D3) 500-600 MG-UNIT TABS Take 1 tablet by mouth daily.    Marland Kitchen denosumab (PROLIA) 60 MG/ML SOLN injection Inject 60 mg into the skin every 6 (six) months. Administer in upper arm, thigh, or abdomen 10/07/2018: LAST DOSE:09/2018  . ELIQUIS 2.5 MG TABS tablet    . HUMIRA PEN 40 MG/0.8ML PNKT INJECT 40 MG SUBQ EVERY 14 DAYS. (Patient taking differently: Inject 40 mg as directed every 14 (fourteen) days. SATURDAYS.)   . lidocaine-prilocaine (EMLA) cream Apply to port site 1 hour prior to appointment 10/07/2018: NEW MEDICATION (PATIENT HAS NOT CHEMOTHERAPY TO DATE)  . Magnesium 500 MG TABS Take 500 mg by mouth daily.   . mesalamine (APRISO) 0.375 g 24 hr capsule Take 4 capsules by mouth each morning.   . Naphazoline-Pheniramine (OPCON-A) 0.027-0.315 % SOLN Place 1 drop into both eyes 3 (three) times daily as needed (dry eyes).  09/21/2018: As needed , rare per patient  . PACLitaxel (TAXOL IV) Inject into the vein once a week. Weekly x 12 cycles starting 10/22/2018 10/07/2018: NEW MEDICATION (PATIENT HAS NOT CHEMOTHERAPY TO DATE)   . polyethylene glycol powder (GLYCOLAX/MIRALAX) powder Take 8.5 g by mouth daily as needed. (Patient taking differently: Take 8.5 g by mouth daily as needed (constipation.). )   . PRESCRIPTION MEDICATION Apply 1 application topically 2 (two) times daily as needed (eczema). Ketoconazole-fluticasone 1:1 compounded cream  08/03/2018: Compounded at [facility]  .  prochlorperazine (COMPAZINE) 10 MG tablet Take 1 tablet (10 mg total) by mouth every 6 (six) hours as needed (Nausea or vomiting). 10/07/2018: NEW MEDICATION (PATIENT HAS NOT CHEMOTHERAPY TO DATE)   . traMADol (ULTRAM) 50 MG tablet Take 1 tablet (50 mg total) by mouth every 6 (six) hours as needed.   . Trastuzumab (HERCEPTIN IV) Inject into the vein every 21 ( twenty-one) days. Every 3 weeks x 1 year starting 10/22/2018 10/07/2018: NEW THERAPY PATIENT HAS NOT STARTED YET   Facility-Administered Encounter Medications as of 01/07/2019  Medication  . 0.9 %  sodium chloride infusion  . acetaminophen (TYLENOL) tablet 650 mg  . dexamethasone (DECADRON) 20 mg in sodium chloride 0.9 % 50 mL IVPB  . diphenhydrAMINE (BENADRYL) injection 25 mg  . famotidine (PEPCID) IVPB 20 mg premix  . heparin lock flush 100 unit/mL  . PACLitaxel (TAXOL) 72 mg in sodium chloride 0.9 % 150 mL chemo infusion (</= 57m/m2)  . sodium chloride flush (NS) 0.9 % injection 10 mL  . trastuzumab (HERCEPTIN) 147 mg in sodium chloride 0.9 % 250 mL chemo infusion  . fulvestrant (FASLODEX) 250 MG/5ML injection  . octreotide (SANDOSTATIN LAR) 20 MG IM injection  . octreotide (SANDOSTATIN LAR) 30 MG IM injection    ALLERGIES:  Allergies  Allergen Reactions  . Mercaptopurine Other (See Comments)    Dropped WBC  . Sulfa Antibiotics Other (See Comments)    Extreme Weakness     PHYSICAL EXAM:  ECOG Performance status: 1  Vitals:   01/07/19 0811  BP: (!) 163/59  Pulse: 75  Resp: 16  Temp: 98.3 F (36.8 C)  SpO2: 100%   Filed Weights   01/07/19 0811  Weight: 178 lb (80.7 kg)    Physical Exam Vitals signs reviewed.  Constitutional:      Appearance: Normal appearance.  Cardiovascular:     Rate and Rhythm: Normal rate and regular rhythm.     Heart sounds: Normal heart sounds.  Pulmonary:     Effort: Pulmonary effort is normal.     Breath sounds:  Normal breath sounds.  Abdominal:     General: There is no distension.      Palpations: Abdomen is soft. There is no mass.  Musculoskeletal:        General: No swelling.  Skin:    General: Skin is warm.  Neurological:     General: No focal deficit present.     Mental Status: She is alert and oriented to person, place, and time.  Psychiatric:        Mood and Affect: Mood normal.        Behavior: Behavior normal.      LABORATORY DATA:  I have reviewed the labs as listed.  CBC    Component Value Date/Time   WBC 7.0 01/07/2019 0752   RBC 3.67 (L) 01/07/2019 0752   HGB 11.3 (L) 01/07/2019 0752   HGB 14.4 03/10/2017 1254   HCT 34.7 (L) 01/07/2019 0752   HCT 41.5 03/10/2017 1254   PLT 194 01/07/2019 0752   PLT 249 03/10/2017 1254   MCV 94.6 01/07/2019 0752   MCV 91 03/10/2017 1254   MCH 30.8 01/07/2019 0752   MCHC 32.6 01/07/2019 0752   RDW 15.0 01/07/2019 0752   RDW 13.3 03/10/2017 1254   LYMPHSABS 1.8 01/07/2019 0752   LYMPHSABS 2.7 03/10/2017 1254   MONOABS 0.7 01/07/2019 0752   EOSABS 0.2 01/07/2019 0752   EOSABS 0.4 03/10/2017 1254   BASOSABS 0.0 01/07/2019 0752   BASOSABS 0.1 03/10/2017 1254   CMP Latest Ref Rng & Units 01/07/2019 12/31/2018 12/24/2018  Glucose 70 - 99 mg/dL 107(H) 102(H) 109(H)  BUN 8 - 23 mg/dL 22 16 21   Creatinine 0.44 - 1.00 mg/dL 0.87 0.94 0.94  Sodium 135 - 145 mmol/L 137 139 138  Potassium 3.5 - 5.1 mmol/L 4.1 3.9 4.3  Chloride 98 - 111 mmol/L 105 106 107  CO2 22 - 32 mmol/L 24 24 24   Calcium 8.9 - 10.3 mg/dL 8.6(L) 8.8(L) 8.6(L)  Total Protein 6.5 - 8.1 g/dL 6.0(L) 6.6 6.4(L)  Total Bilirubin 0.3 - 1.2 mg/dL 0.4 0.4 0.4  Alkaline Phos 38 - 126 U/L 28(L) 27(L) 29(L)  AST 15 - 41 U/L 16 16 18   ALT 0 - 44 U/L 18 18 19        DIAGNOSTIC IMAGING:  I have independently reviewed the scans and discussed with the patient.     ASSESSMENT & PLAN:   Breast cancer of upper-inner quadrant of left female breast ([place]) 1.  Stage I (PT1CNX) HER-2 positive left breast cancer: - Abnormal mammogram at [facility], followed  by ultrasound-guided biopsy on 06/23/2018.  IDC, HER-2 positive, ER/PR positive, Ki 67 2%. - Stereotactic biopsy in [place] on 06/29/2018, of the left breast irregular mass within the posterior upper, slightly inner quadrant.  A coil-shaped clip was placed. 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  Ki 67 was 10%. - Patient had a history of left breast cancer in 1998, status post lumpectomy, followed by chemotherapy and radiation, 1 year of tamoxifen which was switched to anastrozole for subsequent 4 years due to intolerance. - She had a history of right breast cancer in 2009, status post mastectomy.  She did not receive any adjuvant chemotherapy or radiation. - She was started on anastrozole on 07/21/2018, she took it for 2 weeks and felt very emotional.  She stopped taking it. -Left mastectomy on 08/19/2018, 1.1 cm IDC, grade 1, margins negative.  No lymph nodes were identified.,  ER was 100%  positive, PR 7% positive and HER-2 3+ positive.  - I have called and talked to Dr. Claudette Laws (pathologist).  He clarified that there was an initial biopsy done on 06/23/2018, at [facility].  This report is not uploaded on epic.  The initial biopsy was reportedly positive for HER-2. -The second biopsy on 06/29/2018 was negative for HER-2.  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 - The second focus was not clearly identified on the mastectomy specimen. - Adjuvant chemotherapy with weekly paclitaxel and Herceptin started on 10/22/2018. -2D echocardiogram on 10/07/2018 shows EF of 60 to 65%. -Week 10 of treatment on 12/31/2018. -He reported more diarrhea since last treatment.  She changed her magnesium preparation and thinks that might have contributed to it.  Denied any tingling or numbness in the extremities.  Denies any mucositis.  No GI symptoms including nausea vomiting reported. -Her appetite and  energy levels are very good. -I have reviewed her labs.  She may proceed with week 11 of treatment today.  She will come back next week for week 12. -We will plan to repeat a 2D echocardiogram.  We will switch her Herceptin to every 3 weeks after that.  2.  Osteoporosis: -She has DEXA scan done at [facility].  She receives Prolia every 6 months at Dr. Carmie End office. -She will continue calcium and vitamin D twice daily.  3.  Recurrent DVT: -She had a history of DVT in 2000's after 1 week post trip to [place], treated with Coumadin until 5 years ago.  This was stopped secondary to bleed complicated by her ulcerative colitis. -She had a recurrent right leg femoral and popliteal DVT on 03/08/2018 and currently on Eliquis.  No bleeding reported.  4.  Ulcerative colitis: -She is currently on mesalamine and Humira. -She does have constipation alternating with diarrhea from her UC.  This has not worsened since the start of Herceptin and chemotherapy.    Total time spent is 25 minutes with more than 50% of the time spent face-to-face discussing treatment plan, side effects and coordination of care.    Orders placed this encounter:  Orders Placed This Encounter  Procedures  . ECHOCARDIOGRAM COMPLETE      Derek Jack, [place] [phone]

## 2019-01-07 NOTE — Patient Instructions (Signed)
[facility] Discharge Instructions for Patients Receiving Chemotherapy  Today you received the following chemotherapy agents   To help prevent nausea and vomiting after your treatment, we encourage you to take your nausea medication   If you develop nausea and vomiting that is not controlled by your nausea medication, call the clinic.   BELOW ARE SYMPTOMS THAT SHOULD BE REPORTED IMMEDIATELY:  *FEVER GREATER THAN 100.5 F  *CHILLS WITH OR WITHOUT FEVER  NAUSEA AND VOMITING THAT IS NOT CONTROLLED WITH YOUR NAUSEA MEDICATION  *UNUSUAL SHORTNESS OF BREATH  *UNUSUAL BRUISING OR BLEEDING  TENDERNESS IN MOUTH AND THROAT WITH OR WITHOUT PRESENCE OF ULCERS  *URINARY PROBLEMS  *BOWEL PROBLEMS  UNUSUAL RASH Items with * indicate a potential emergency and should be followed up as soon as possible.  Feel free to call the clinic should you have any questions or concerns. The clinic phone number is [phone].  Please show the CHEMO ALERT CARD at check-in to the Emergency Department and triage nurse.   

## 2019-01-14 ENCOUNTER — Other Ambulatory Visit: Payer: Self-pay

## 2019-01-14 ENCOUNTER — Inpatient Hospital Stay (HOSPITAL_COMMUNITY): Payer: Medicare Other

## 2019-01-14 ENCOUNTER — Encounter (HOSPITAL_COMMUNITY): Payer: Self-pay

## 2019-01-14 VITALS — BP 144/68 | HR 66 | Temp 98.1°F | Resp 18

## 2019-01-14 DIAGNOSIS — Z5112 Encounter for antineoplastic immunotherapy: Secondary | ICD-10-CM | POA: Diagnosis not present

## 2019-01-14 DIAGNOSIS — C50212 Malignant neoplasm of upper-inner quadrant of left female breast: Secondary | ICD-10-CM

## 2019-01-14 LAB — CBC WITH DIFFERENTIAL/PLATELET
Abs Immature Granulocytes: 0.05 10*3/uL (ref 0.00–0.07)
Basophils Absolute: 0 10*3/uL (ref 0.0–0.1)
Basophils Relative: 1 %
Eosinophils Absolute: 0.2 10*3/uL (ref 0.0–0.5)
Eosinophils Relative: 3 %
HCT: 36.4 % (ref 36.0–46.0)
Hemoglobin: 11.6 g/dL — ABNORMAL LOW (ref 12.0–15.0)
Immature Granulocytes: 1 %
Lymphocytes Relative: 31 %
Lymphs Abs: 1.9 10*3/uL (ref 0.7–4.0)
MCH: 30.3 pg (ref 26.0–34.0)
MCHC: 31.9 g/dL (ref 30.0–36.0)
MCV: 95 fL (ref 80.0–100.0)
Monocytes Absolute: 0.7 10*3/uL (ref 0.1–1.0)
Monocytes Relative: 10 %
Neutro Abs: 3.4 10*3/uL (ref 1.7–7.7)
Neutrophils Relative %: 54 %
Platelets: 218 10*3/uL (ref 150–400)
RBC: 3.83 MIL/uL — ABNORMAL LOW (ref 3.87–5.11)
RDW: 15 % (ref 11.5–15.5)
WBC: 6.2 10*3/uL (ref 4.0–10.5)
nRBC: 0 % (ref 0.0–0.2)

## 2019-01-14 LAB — COMPREHENSIVE METABOLIC PANEL
ALT: 17 U/L (ref 0–44)
AST: 16 U/L (ref 15–41)
Albumin: 3.4 g/dL — ABNORMAL LOW (ref 3.5–5.0)
Alkaline Phosphatase: 31 U/L — ABNORMAL LOW (ref 38–126)
Anion gap: 11 (ref 5–15)
BUN: 20 mg/dL (ref 8–23)
CO2: 23 mmol/L (ref 22–32)
Calcium: 8.5 mg/dL — ABNORMAL LOW (ref 8.9–10.3)
Chloride: 106 mmol/L (ref 98–111)
Creatinine, Ser: 0.89 mg/dL (ref 0.44–1.00)
GFR calc Af Amer: >60 mL/min (ref >60)
GFR calc non Af Amer: >60 mL/min (ref >60)
Glucose, Bld: 110 mg/dL — ABNORMAL HIGH (ref 70–99)
Potassium: 3.9 mmol/L (ref 3.5–5.1)
Sodium: 140 mmol/L (ref 135–145)
Total Bilirubin: 0.7 mg/dL (ref 0.3–1.2)
Total Protein: 6.3 g/dL — ABNORMAL LOW (ref 6.5–8.1)

## 2019-01-14 MED ORDER — DIPHENHYDRAMINE HCL 50 MG/ML IJ SOLN
25.0000 mg | Freq: Once | INTRAMUSCULAR | Status: AC
  Administered 2019-01-14: 25 mg via INTRAVENOUS
  Filled 2019-01-14: qty 1

## 2019-01-14 MED ORDER — SODIUM CHLORIDE 0.9% FLUSH
10.0000 mL | INTRAVENOUS | Status: DC | PRN
  Administered 2019-01-14: 10 mL
  Filled 2019-01-14: qty 10

## 2019-01-14 MED ORDER — SODIUM CHLORIDE 0.9 % IV SOLN
40.0000 mg/m2 | Freq: Once | INTRAVENOUS | Status: AC
  Administered 2019-01-14: 72 mg via INTRAVENOUS
  Filled 2019-01-14: qty 12

## 2019-01-14 MED ORDER — SODIUM CHLORIDE 0.9 % IV SOLN
Freq: Once | INTRAVENOUS | Status: AC
  Administered 2019-01-14: 09:00:00 via INTRAVENOUS

## 2019-01-14 MED ORDER — HEPARIN SOD (PORK) LOCK FLUSH 100 UNIT/ML IV SOLN
500.0000 [IU] | Freq: Once | INTRAVENOUS | Status: AC | PRN
  Administered 2019-01-14: 500 [IU]

## 2019-01-14 MED ORDER — FAMOTIDINE IN NACL 20-0.9 MG/50ML-% IV SOLN
20.0000 mg | Freq: Once | INTRAVENOUS | Status: AC
  Administered 2019-01-14: 20 mg via INTRAVENOUS
  Filled 2019-01-14: qty 50

## 2019-01-14 MED ORDER — TRASTUZUMAB CHEMO 150 MG IV SOLR
150.0000 mg | Freq: Once | INTRAVENOUS | Status: AC
  Administered 2019-01-14: 150 mg via INTRAVENOUS
  Filled 2019-01-14: qty 7.14

## 2019-01-14 MED ORDER — ACETAMINOPHEN 325 MG PO TABS
650.0000 mg | ORAL_TABLET | Freq: Once | ORAL | Status: AC
  Administered 2019-01-14: 650 mg via ORAL
  Filled 2019-01-14: qty 2

## 2019-01-14 MED ORDER — SODIUM CHLORIDE 0.9 % IV SOLN
20.0000 mg | Freq: Once | INTRAVENOUS | Status: AC
  Administered 2019-01-14: 20 mg via INTRAVENOUS
  Filled 2019-01-14: qty 2

## 2019-01-14 NOTE — Patient Instructions (Signed)
[facility] Discharge Instructions for Patients Receiving Chemotherapy  Today you received the following chemotherapy agents   To help prevent nausea and vomiting after your treatment, we encourage you to take your nausea medication   If you develop nausea and vomiting that is not controlled by your nausea medication, call the clinic.   BELOW ARE SYMPTOMS THAT SHOULD BE REPORTED IMMEDIATELY:  *FEVER GREATER THAN 100.5 F  *CHILLS WITH OR WITHOUT FEVER  NAUSEA AND VOMITING THAT IS NOT CONTROLLED WITH YOUR NAUSEA MEDICATION  *UNUSUAL SHORTNESS OF BREATH  *UNUSUAL BRUISING OR BLEEDING  TENDERNESS IN MOUTH AND THROAT WITH OR WITHOUT PRESENCE OF ULCERS  *URINARY PROBLEMS  *BOWEL PROBLEMS  UNUSUAL RASH Items with * indicate a potential emergency and should be followed up as soon as possible.  Feel free to call the clinic should you have any questions or concerns. The clinic phone number is [phone].  Please show the CHEMO ALERT CARD at check-in to the Emergency Department and triage nurse.   

## 2019-01-14 NOTE — Patient Instructions (Signed)
[facility] at [facility] Discharge Instructions  Labs drawn from portacath today   Thank you for choosing [facility] at [facility] to provide your oncology and hematology care.  To afford each patient quality time with our provider, please arrive at least 15 minutes before your scheduled appointment time.   If you have a lab appointment with the Cancer Center please come in thru the  Main Entrance and check in at the main information desk  You need to re-schedule your appointment should you arrive 10 or more minutes late.  We strive to give you quality time with our providers, and arriving late affects you and other patients whose appointments are after yours.  Also, if you no show three or more times for appointments you may be dismissed from the clinic at the providers discretion.     Again, thank you for choosing [facility].  Our hope is that these requests will decrease the amount of time that you wait before being seen by our physicians.       _____________________________________________________________  Should you have questions after your visit to [facility], please contact our office at [phone] between the hours of 8:00 a.m. and 4:30 p.m.  Voicemails left after 4:00 p.m. will not be returned until the following business day.  For prescription refill requests, have your pharmacy contact our office and allow 72 hours.    Cancer Center Support Programs:   > Cancer Support Group  2nd Tuesday of the month 1pm-2pm, Journey Room   

## 2019-01-14 NOTE — Progress Notes (Signed)
01/14/19  Maintain dose of Herceptin at 2 mg/kg for today's treatment.  V.O. Dr Maye Hides RN/Edgar Reisz Ronnald Ramp, PharmD

## 2019-01-14 NOTE — Progress Notes (Signed)
Labs meet parameters for treatment today. Proceed per MD.  Treatment given per orders. Patient tolerated it well without problems. Vitals stable and discharged home from clinic ambulatory. Follow up as scheduled.

## 2019-01-21 ENCOUNTER — Ambulatory Visit (HOSPITAL_COMMUNITY): Payer: Medicare Other

## 2019-01-21 ENCOUNTER — Other Ambulatory Visit (HOSPITAL_COMMUNITY): Payer: Medicare Other

## 2019-01-25 ENCOUNTER — Ambulatory Visit (HOSPITAL_COMMUNITY)
Admission: RE | Admit: 2019-01-25 | Discharge: 2019-01-25 | Disposition: A | Discharge status: Home / Self Care | Payer: Medicare Other | Source: Ambulatory Visit | Attending: Hematology | Admitting: Hematology

## 2019-01-25 ENCOUNTER — Other Ambulatory Visit: Payer: Self-pay

## 2019-01-25 DIAGNOSIS — I080 Rheumatic disorders of both mitral and aortic valves: Secondary | ICD-10-CM | POA: Insufficient documentation

## 2019-01-25 DIAGNOSIS — Z86718 Personal history of other venous thrombosis and embolism: Secondary | ICD-10-CM | POA: Insufficient documentation

## 2019-01-25 DIAGNOSIS — Z9013 Acquired absence of bilateral breasts and nipples: Secondary | ICD-10-CM | POA: Diagnosis not present

## 2019-01-25 DIAGNOSIS — C50212 Malignant neoplasm of upper-inner quadrant of left female breast: Secondary | ICD-10-CM | POA: Diagnosis not present

## 2019-01-25 DIAGNOSIS — I4510 Unspecified right bundle-branch block: Secondary | ICD-10-CM | POA: Diagnosis not present

## 2019-01-25 DIAGNOSIS — Z170 Estrogen receptor positive status [ER+]: Secondary | ICD-10-CM

## 2019-01-25 NOTE — Progress Notes (Signed)
*  PRELIMINARY RESULTS* Echocardiogram 2D Echocardiogram has been performed.  Monique Day 01/25/2019, 11:20 AM

## 2019-01-27 ENCOUNTER — Ambulatory Visit (HOSPITAL_COMMUNITY): Payer: Medicare Other | Admitting: Hematology

## 2019-02-03 ENCOUNTER — Other Ambulatory Visit: Payer: Self-pay

## 2019-02-03 ENCOUNTER — Encounter (HOSPITAL_COMMUNITY): Payer: Self-pay | Admitting: Hematology

## 2019-02-03 ENCOUNTER — Inpatient Hospital Stay (HOSPITAL_COMMUNITY): Payer: Medicare Other | Attending: Hematology

## 2019-02-03 ENCOUNTER — Inpatient Hospital Stay (HOSPITAL_COMMUNITY): Payer: Medicare Other

## 2019-02-03 ENCOUNTER — Inpatient Hospital Stay (HOSPITAL_COMMUNITY): Payer: Medicare Other | Admitting: Hematology

## 2019-02-03 VITALS — BP 134/55 | HR 64 | Temp 97.8°F | Resp 18

## 2019-02-03 VITALS — BP 155/81 | HR 91 | Temp 97.9°F | Resp 18 | Wt 180.0 lb

## 2019-02-03 DIAGNOSIS — C50212 Malignant neoplasm of upper-inner quadrant of left female breast: Secondary | ICD-10-CM

## 2019-02-03 DIAGNOSIS — Z79899 Other long term (current) drug therapy: Secondary | ICD-10-CM | POA: Insufficient documentation

## 2019-02-03 DIAGNOSIS — Z7901 Long term (current) use of anticoagulants: Secondary | ICD-10-CM | POA: Insufficient documentation

## 2019-02-03 DIAGNOSIS — Z86718 Personal history of other venous thrombosis and embolism: Secondary | ICD-10-CM | POA: Insufficient documentation

## 2019-02-03 DIAGNOSIS — K5190 Ulcerative colitis, unspecified, without complications: Secondary | ICD-10-CM | POA: Diagnosis not present

## 2019-02-03 DIAGNOSIS — Z5112 Encounter for antineoplastic immunotherapy: Secondary | ICD-10-CM | POA: Insufficient documentation

## 2019-02-03 DIAGNOSIS — Z170 Estrogen receptor positive status [ER+]: Secondary | ICD-10-CM | POA: Insufficient documentation

## 2019-02-03 DIAGNOSIS — Z79811 Long term (current) use of aromatase inhibitors: Secondary | ICD-10-CM

## 2019-02-03 DIAGNOSIS — Z87891 Personal history of nicotine dependence: Secondary | ICD-10-CM | POA: Diagnosis not present

## 2019-02-03 LAB — CBC WITH DIFFERENTIAL/PLATELET
Abs Immature Granulocytes: 0.02 10*3/uL (ref 0.00–0.07)
Basophils Absolute: 0 10*3/uL (ref 0.0–0.1)
Basophils Relative: 1 %
Eosinophils Absolute: 0.2 10*3/uL (ref 0.0–0.5)
Eosinophils Relative: 3 %
HCT: 38.2 % (ref 36.0–46.0)
Hemoglobin: 12 g/dL (ref 12.0–15.0)
Immature Granulocytes: 0 %
Lymphocytes Relative: 40 %
Lymphs Abs: 2.5 10*3/uL (ref 0.7–4.0)
MCH: 30.2 pg (ref 26.0–34.0)
MCHC: 31.4 g/dL (ref 30.0–36.0)
MCV: 96.2 fL (ref 80.0–100.0)
Monocytes Absolute: 0.7 10*3/uL (ref 0.1–1.0)
Monocytes Relative: 11 %
Neutro Abs: 2.9 10*3/uL (ref 1.7–7.7)
Neutrophils Relative %: 45 %
Platelets: 231 10*3/uL (ref 150–400)
RBC: 3.97 MIL/uL (ref 3.87–5.11)
RDW: 14.7 % (ref 11.5–15.5)
WBC: 6.3 10*3/uL (ref 4.0–10.5)
nRBC: 0 % (ref 0.0–0.2)

## 2019-02-03 LAB — COMPREHENSIVE METABOLIC PANEL
ALT: 19 U/L (ref 0–44)
AST: 18 U/L (ref 15–41)
Albumin: 3.7 g/dL (ref 3.5–5.0)
Alkaline Phosphatase: 30 U/L — ABNORMAL LOW (ref 38–126)
Anion gap: 12 (ref 5–15)
BUN: 16 mg/dL (ref 8–23)
CO2: 22 mmol/L (ref 22–32)
Calcium: 8.5 mg/dL — ABNORMAL LOW (ref 8.9–10.3)
Chloride: 106 mmol/L (ref 98–111)
Creatinine, Ser: 0.94 mg/dL (ref 0.44–1.00)
GFR calc Af Amer: >60 mL/min (ref >60)
GFR calc non Af Amer: 57 mL/min — ABNORMAL LOW (ref >60)
Glucose, Bld: 111 mg/dL — ABNORMAL HIGH (ref 70–99)
Potassium: 3.9 mmol/L (ref 3.5–5.1)
Sodium: 140 mmol/L (ref 135–145)
Total Bilirubin: 0.6 mg/dL (ref 0.3–1.2)
Total Protein: 6.5 g/dL (ref 6.5–8.1)

## 2019-02-03 MED ORDER — ACETAMINOPHEN 325 MG PO TABS
650.0000 mg | ORAL_TABLET | Freq: Once | ORAL | Status: AC
  Administered 2019-02-03: 650 mg via ORAL

## 2019-02-03 MED ORDER — ANASTROZOLE 1 MG PO TABS: 1.0000 mg | ORAL_TABLET | Freq: Every day | ORAL | 6 refills | Status: DC

## 2019-02-03 MED ORDER — DIPHENHYDRAMINE HCL 25 MG PO CAPS: 50.0000 mg | ORAL_CAPSULE | Freq: Once | ORAL | Status: DC

## 2019-02-03 MED ORDER — SODIUM CHLORIDE 0.9% FLUSH
10.0000 mL | INTRAVENOUS | Status: DC | PRN
  Administered 2019-02-03: 10 mL
  Filled 2019-02-03: qty 10

## 2019-02-03 MED ORDER — TRASTUZUMAB CHEMO 150 MG IV SOLR
450.0000 mg | Freq: Once | INTRAVENOUS | Status: AC
  Administered 2019-02-03: 11:00:00 450 mg via INTRAVENOUS
  Filled 2019-02-03: qty 21.43

## 2019-02-03 MED ORDER — ACETAMINOPHEN 325 MG PO TABS
ORAL_TABLET | ORAL | Status: AC
  Filled 2019-02-03: qty 2

## 2019-02-03 MED ORDER — LETROZOLE 2.5 MG PO TABS: 2.5000 mg | ORAL_TABLET | Freq: Every day | ORAL | 6 refills | Status: DC

## 2019-02-03 MED ORDER — HEPARIN SOD (PORK) LOCK FLUSH 100 UNIT/ML IV SOLN
500.0000 [IU] | Freq: Once | INTRAVENOUS | Status: AC | PRN
  Administered 2019-02-03: 500 [IU]

## 2019-02-03 MED ORDER — SODIUM CHLORIDE 0.9 % IV SOLN
Freq: Once | INTRAVENOUS | Status: AC
  Administered 2019-02-03: 10:00:00 via INTRAVENOUS

## 2019-02-03 NOTE — Patient Instructions (Signed)
[facility] Discharge Instructions for Patients Receiving Chemotherapy   Beginning January 23rd 2017 lab work for the [facility] will be done in the  Main lab at [facility] on 1st floor. If you have a lab appointment with the [facility] please come in thru the  Main Entrance and check in at the main information desk   Today you received the following chemotherapy agents Herceptin. Follow-up as scheduled. Call clinic for any questions or concerns  To help prevent nausea and vomiting after your treatment, we encourage you to take your nausea medication.   If you develop nausea and vomiting, or diarrhea that is not controlled by your medication, call the clinic.  The clinic phone number is [phone]. Office hours are Monday-Friday 8:30am-5:00pm.  BELOW ARE SYMPTOMS THAT SHOULD BE REPORTED IMMEDIATELY:  *FEVER GREATER THAN 101.0 F  *CHILLS WITH OR WITHOUT FEVER  NAUSEA AND VOMITING THAT IS NOT CONTROLLED WITH YOUR NAUSEA MEDICATION  *UNUSUAL SHORTNESS OF BREATH  *UNUSUAL BRUISING OR BLEEDING  TENDERNESS IN MOUTH AND THROAT WITH OR WITHOUT PRESENCE OF ULCERS  *URINARY PROBLEMS  *BOWEL PROBLEMS  UNUSUAL RASH Items with * indicate a potential emergency and should be followed up as soon as possible. If you have an emergency after office hours please contact your primary care physician or go to the nearest emergency department.  Please call the clinic during office hours if you have any questions or concerns.   You may also contact the Patient Navigator at [phone] should you have any questions or need assistance in obtaining follow up care.      Resources For Cancer Patients and their Caregivers ? American Cancer Society: Can assist with transportation, wigs, general needs, runs Look Good Feel Better.        858-758-1482 ? Cancer Care: Provides financial assistance, online support groups, medication/co-pay assistance.  1-800-813-HOPE  806-298-8094) ? [facility] Assists [place] cancer patients and their families through emotional , educational and financial support.  613-761-0586 ? [place] DSS Where to apply for food stamps, Medicaid and utility assistance. (647)255-9099 ? [facility]: Transportation to medical appointments. (770) 524-8809 ? Social Security Administration: May apply for disability if have a Stage IV cancer. 380-529-8585 813-131-5226 ? [employer], Disability and Transit Services: Assists with nutrition, care and transit needs. 210-780-1103

## 2019-02-03 NOTE — Progress Notes (Signed)
[place], [place] [address]   CLINIC:  Medical Oncology/Hematology  PCP:  Octavio Graves, DO [address] [place] [address] [phone]   REASON FOR VISIT:  Follow-up for left breast cancer    BRIEF ONCOLOGIC HISTORY:  Oncology History  Breast cancer of upper-inner quadrant of left female breast ([facility])  07/21/2018 Initial Diagnosis   Breast cancer of upper-inner quadrant of left female breast ([place])   10/22/2018 -  Chemotherapy   The patient had trastuzumab (HERCEPTIN) 300 mg in sodium chloride 0.9 % 250 mL chemo infusion, 315 mg, Intravenous,  Once, 4 of 16 cycles Administration: 300 mg (10/22/2018), 150 mg (10/29/2018), 150 mg (11/19/2018), 450 mg (02/03/2019), 150 mg (11/05/2018), 150 mg (11/12/2018), 150 mg (12/01/2018), 150 mg (12/10/2018), 150 mg (12/17/2018), 150 mg (12/24/2018), 150 mg (12/31/2018), 150 mg (01/07/2019), 150 mg (01/14/2019) PACLitaxel (TAXOL) 72 mg in sodium chloride 0.9 % 150 mL chemo infusion (</= 11m/m2), 40 mg/m2 = 72 mg (50 % of original dose 80 mg/m2), Intravenous,  Once, 3 of 3 cycles Dose modification: 40 mg/m2 (50 % of original dose 80 mg/m2, Cycle 1, Reason: Provider Judgment) Administration: 72 mg (10/22/2018), 72 mg (10/29/2018), 72 mg (11/19/2018), 72 mg (11/05/2018), 72 mg (11/12/2018), 72 mg (12/01/2018), 72 mg (12/10/2018), 72 mg (12/17/2018), 72 mg (12/24/2018), 72 mg (12/31/2018), 72 mg (01/07/2019), 72 mg (01/14/2019)  for chemotherapy treatment.       CANCER STAGING: Cancer Staging No matching staging information was found for the patient.   INTERVAL HISTORY:  Ms. WManner89y.o. female here for follow-up of breast cancer.  She received her last cycle of chemotherapy on 01/14/2019.  She reported that she could not tolerate anastrozole because of confusion and forgetfulness.  She has tried it for couple of weeks in December 2019.  Appetite is 100%.  Energy levels are 75%.  Denies any nausea vomiting or diarrhea.  Reports  some leg swellings.  Denies any ER visits or hospitalizations.  She had echocardiogram done.   REVIEW OF SYSTEMS:  Review of Systems  Cardiovascular: Positive for leg swelling.  All other systems reviewed and are negative.    PAST MEDICAL/SURGICAL HISTORY:  Past Medical History:  Diagnosis Date  . Adult idiopathic generalized osteoporosis   . Arthritis    "minor" (08/18/2018)  . Breast cancer, left breast ([place] [place] [place]   lumpectomy  . Breast cancer, right breast ([place] 2009   lumpectomy; mastectomy  . DVT (deep venous thrombosis) ([place] "early 2000's"   RLE  . DVT (deep venous thrombosis) ([place]   . Hemorrhoids, external   . History of blood transfusion    "related to ulcerative colitis"  . Lower abdominal pain   . Occult blood in stools   . Peripheral edema   . Seizures ([place]    one very slight seizure with the stroke; somewhere between 2005-2009  . Stroke ([facility] 2005-2009   "very light;" ; denies residual on 08/19/2018  . UC (ulcerative colitis) [place] [address]  Past Surgical History:  Procedure Laterality Date  . APPENDECTOMY    . bone density  12/11/10  . BREAST BIOPSY Left 1998; 2019 X 2  . BREAST BIOPSY Right 2009  . BREAST LUMPECTOMY Left 1998  . CATARACT EXTRACTION W/ INTRAOCULAR LENS  IMPLANT, BILATERAL Bilateral   . COLONOSCOPY  02/21/2011  . COLONOSCOPY  10/06/2008  . COLONOSCOPY  12/27/01  . COLONOSCOPY  05/09/05  . COLONOSCOPY N/A 07/02/2016   Procedure:  COLONOSCOPY;  Surgeon: [name], MD;  Location: [facility]ITE;  Service: Endoscopy;  Laterality: N/A;  1055  . [place]   "I was having rectal bleeding"  . FRACTURE SURGERY    . MASTECTOMY Right 2009  . MASTECTOMY COMPLETE / SIMPLE Left 08/19/2018  . PORTACATH PLACEMENT Right 10/15/2018   Procedure: INSERTION PORT-A-CATH RIGHT SUBCLAVIN;  Surgeon: [name], MD;  Location: [facility] ORS;  Service: General;  Laterality: Right;  pt knows to arrive at 7:30  . SIMPLE MASTECTOMY WITH  AXILLARY SENTINEL NODE BIOPSY Left 08/19/2018   Procedure: LEFT SIMPLE MASTECTOMY;  Surgeon: [name], MD;  Location: [facility];  Service: General;  Laterality: Left;  . TONSILLECTOMY    . VENA CAVA FILTER PLACEMENT Right   . WRIST FRACTURE SURGERY Right      SOCIAL HISTORY:  Social History   Socioeconomic History  . Marital status: Widowed    Spouse name: Not on file  . Number of children: 0  . Years of education: Not on file  . Highest education level: Not on file  Occupational History  . Occupation: Optometrist: [employer]  Social Needs  . Financial resource strain: Not hard at all  . Food insecurity    Worry: Never true    Inability: Never true  . Transportation needs    Medical: No    Non-medical: No  Tobacco Use  . Smoking status: Former Smoker    Packs/day: 2.00    Years: 40.00    Pack years: 80.00    Types: Cigarettes    Quit date: 05/05/2001    Years since quitting: 17.7  . Smokeless tobacco: Never Used  Substance and Sexual Activity  . Alcohol use: Yes    Comment: 08/18/2018 "couple drinks/year"  . Drug use: Never  . Sexual activity: Not Currently  Lifestyle  . Physical activity    Days per week: 0 days    Minutes per session: 0 min  . Stress: Not at all  Relationships  . Social connections    Talks on phone: More than three times a week    Gets together: More than three times a week    Attends religious service: More than 4 times per year    Active member of club or organization: Yes    Attends meetings of clubs or organizations: More than 4 times per year    Relationship status: Widowed  . Intimate partner violence    Fear of current or ex partner: No    Emotionally abused: No    Physically abused: No    Forced sexual activity: No  Other Topics Concern  . Not on file  Social History Narrative  . Not on file    FAMILY HISTORY:  Family History  Problem Relation Age of Onset  . Prostate cancer Father   .  Colon cancer Neg Hx     CURRENT MEDICATIONS:  Outpatient Encounter Medications as of 02/03/2019  Medication Sig Note  . acetaminophen (TYLENOL) 500 MG tablet Take 500 mg by mouth every 6 (six) hours as needed for moderate pain or headache.    Marland Kitchen apixaban (ELIQUIS) 5 MG TABS tablet Take 2 tablets (10 mg total) by mouth 2 (two) times daily. Then 1 tablet (5 mg) two times daily starting 03/16/18 (Patient taking differently: Take 2.5 mg by mouth 2 (two) times daily. )   . Ascorbic Acid (VITAMIN C) 1000 MG tablet Take 1,000 mg by  mouth daily.   . Calcium Carb-Cholecalciferol (CALCIUM 500 + D3) 500-600 MG-UNIT TABS Take 1 tablet by mouth daily.    Marland Kitchen denosumab (PROLIA) 60 MG/ML SOLN injection Inject 60 mg into the skin every 6 (six) months. Administer in upper arm, thigh, or abdomen 10/07/2018: LAST DOSE:09/2018  . HUMIRA PEN 40 MG/0.8ML PNKT INJECT 40 MG SUBQ EVERY 14 DAYS. (Patient taking differently: Inject 40 mg as directed every 14 (fourteen) days. SATURDAYS.)   . HUMIRA PEN 40 MG/0.8ML PNKT    . lidocaine-prilocaine (EMLA) cream Apply to port site 1 hour prior to appointment 10/07/2018: NEW MEDICATION (PATIENT HAS NOT CHEMOTHERAPY TO DATE)  . Magnesium 500 MG TABS Take 500 mg by mouth daily.   . mesalamine (APRISO) 0.375 g 24 hr capsule Take 4 capsules by mouth each morning.   . Naphazoline-Pheniramine (OPCON-A) 0.027-0.315 % SOLN Place 1 drop into both eyes 3 (three) times daily as needed (dry eyes).  09/21/2018: As needed , rare per patient  . PACLitaxel (TAXOL IV) Inject into the vein once a week. Weekly x 12 cycles starting 10/22/2018 10/07/2018: NEW MEDICATION (PATIENT HAS NOT CHEMOTHERAPY TO DATE)   . polyethylene glycol powder (GLYCOLAX/MIRALAX) powder Take 8.5 g by mouth daily as needed. (Patient taking differently: Take 8.5 g by mouth daily as needed (constipation.). )   . PRESCRIPTION MEDICATION Apply 1 application topically 2 (two) times daily as needed (eczema). Ketoconazole-fluticasone 1:1  compounded cream  08/03/2018: Compounded at [facility]  . prochlorperazine (COMPAZINE) 10 MG tablet Take 1 tablet (10 mg total) by mouth every 6 (six) hours as needed (Nausea or vomiting). 10/07/2018: NEW MEDICATION (PATIENT HAS NOT CHEMOTHERAPY TO DATE)   . traMADol (ULTRAM) 50 MG tablet Take 1 tablet (50 mg total) by mouth every 6 (six) hours as needed.   . Trastuzumab (HERCEPTIN IV) Inject into the vein every 21 ( twenty-one) days. Every 3 weeks x 1 year starting 10/22/2018 10/07/2018: NEW THERAPY PATIENT HAS NOT STARTED YET  . letrozole (FEMARA) 2.5 MG tablet Take 1 tablet (2.5 mg total) by mouth daily.   . [DISCONTINUED] anastrozole (ARIMIDEX) 1 MG tablet Take 1 tablet (1 mg total) by mouth daily.   . [DISCONTINUED] ELIQUIS 2.5 MG TABS tablet    . [DISCONTINUED] ELIQUIS 2.5 MG TABS tablet     No facility-administered encounter medications on file as of 02/03/2019.     ALLERGIES:  Allergies  Allergen Reactions  . Mercaptopurine Other (See Comments)    Dropped WBC  . Sulfa Antibiotics Other (See Comments)    Extreme Weakness     PHYSICAL EXAM:  ECOG Performance status: 1  Vitals:   02/03/19 0842  BP: (!) 155/81  Pulse: 91  Resp: 18  Temp: 97.9 F (36.6 C)  SpO2: 99%   Filed Weights   02/03/19 0842  Weight: 180 lb (81.6 kg)    Physical Exam Vitals signs reviewed.  Constitutional:      Appearance: Normal appearance.  Cardiovascular:     Rate and Rhythm: Normal rate and regular rhythm.     Heart sounds: Normal heart sounds.  Pulmonary:     Effort: Pulmonary effort is normal.     Breath sounds: Normal breath sounds.  Abdominal:     General: There is no distension.     Palpations: Abdomen is soft. There is no mass.  Musculoskeletal:        General: No swelling.  Skin:    General: Skin is warm.  Neurological:     General: No focal  deficit present.     Mental Status: She is alert and oriented to person, place, and time.  Psychiatric:        Mood and Affect: Mood  normal.        Behavior: Behavior normal.    Very mild edema in the legs.  LABORATORY DATA:  I have reviewed the labs as listed.  CBC    Component Value Date/Time   WBC 6.3 02/03/2019 0840   RBC 3.97 02/03/2019 0840   HGB 12.0 02/03/2019 0840   HGB 14.4 03/10/2017 1254   HCT 38.2 02/03/2019 0840   HCT 41.5 03/10/2017 1254   PLT 231 02/03/2019 0840   PLT 249 03/10/2017 1254   MCV 96.2 02/03/2019 0840   MCV 91 03/10/2017 1254   MCH 30.2 02/03/2019 0840   MCHC 31.4 02/03/2019 0840   RDW 14.7 02/03/2019 0840   RDW 13.3 03/10/2017 1254   LYMPHSABS 2.5 02/03/2019 0840   LYMPHSABS 2.7 03/10/2017 1254   MONOABS 0.7 02/03/2019 0840   EOSABS 0.2 02/03/2019 0840   EOSABS 0.4 03/10/2017 1254   BASOSABS 0.0 02/03/2019 0840   BASOSABS 0.1 03/10/2017 1254   CMP Latest Ref Rng & Units 02/03/2019 01/14/2019 01/07/2019  Glucose 70 - 99 mg/dL 111(H) 110(H) 107(H)  BUN 8 - 23 mg/dL _0 Creatinine 0.44 - 1.00 mg/dL 0.94 0.89 0.87  Sodium 135 - 145 mmol/L 140 140 137  Potassium 3.5 - 5.1 mmol/L 3.9 3.9 4.1  Chloride 98 - 111 mmol/L 106 106 105  CO2 22 - 32 mmol/L _1 Calcium 8.9 - 10.3 mg/dL 8.5(L) 8.5(L) 8.6(L)  Total Protein 6.5 - 8.1 g/dL 6.5 6.3(L) 6.0(L)  Total Bilirubin 0.3 - 1.2 mg/dL 0.6 0.7 0.4  Alkaline Phos 38 - 126 U/L 30(L) 31(L) 28(L)  AST 15 - 41 U/L _2 ALT 0 - 44 U/L _3 DIAGNOSTIC IMAGING:  I have independently reviewed the scans and discussed with the patient.     ASSESSMENT & PLAN:   Breast cancer of upper-inner quadrant of left female breast ([place]) 1.  Stage I (PT1CNX) HER-2 positive left breast cancer: - Left mastectomy on 08/19/2018, 1.1 cm IDC, grade 1, margins negative.  No lymph nodes were identified.  ER was 100% positive, PR 7% positive and HER-2 3+. - Weekly paclitaxel and Herceptin from 10/22/2018 through 01/14/2019 with Taxol dose reduced at 40 mg per metered square. - Echocardiogram on 01/25/2019 shows ejection fraction of 60  to 65%.  Some degree of aortic stenosis with left ventricular wall thickness was noted. - We talked about starting her on Herceptin every 3 weeks.  We talked about side effects in detail.  She will receive for total of 9 months. - She has tried anastrozole in December 2019.  It made her confused and forgetful. -We talked about starting her on letrozole 2.5 mg daily for at least 5 years.  We talked about side effects in detail.  We will send a prescription to her pharmacy. - She will proceed with her Herceptin today.  She will be seen back in 3 weeks and see how she is tolerating letrozole.  2.  Osteopenia: -DEXA scan on 02/08/2018 shows T score of -1.2. -She is receiving Prolia every 6 months with Dr. Melina Copa. -She will continue calcium and vitamin D supplements.  3.  Recurrent DVT: -She had multiple episodes of DVTs.  She is on Eliquis and is tolerating it  well.  4.  Ulcerative colitis: - She is on mesalamine and Humira.  Total time spent is 40 minutes with more than 50% of the time spent face-to-face discussing new treatment plan, side effects and coordination of care.    Orders placed this encounter:  No orders of the defined types were placed in this encounter.     Derek Jack, MD [place] [phone]

## 2019-02-03 NOTE — Progress Notes (Signed)
1000 Labs reviewed with and pt seen by Dr. Delton Coombes and pt approved for Herceptin infusion today per MD              [place] Client tolerated Herceptin infusion well without complaints or incident. VSS upon discharge. Pt discharged self ambulatory in satisfactory condition

## 2019-02-03 NOTE — Patient Instructions (Signed)
[place] at [facility] Discharge Instructions  You were seen today by Dr. Delton Coombes. He went over your recent lab results. He will see you   for labs and follow up.   Thank you for choosing [place] at [facility] to provide your oncology and hematology care.  To afford each patient quality time with our provider, please arrive at least 15 minutes before your scheduled appointment time.   If you have a lab appointment with the [facility] please come in thru the  Main Entrance and check in at the main information desk  You need to re-schedule your appointment should you arrive 10 or more minutes late.  We strive to give you quality time with our providers, and arriving late affects you and other patients whose appointments are after yours.  Also, if you no show three or more times for appointments you may be dismissed from the clinic at the providers discretion.     Again, thank you for choosing [facility].  Our hope is that these requests will decrease the amount of time that you wait before being seen by our physicians.       _____________________________________________________________  Should you have questions after your visit to [facility], please contact our office at [phone] between the hours of 8:00 a.m. and 4:30 p.m.  Voicemails left after 4:00 p.m. will not be returned until the following business day.  For prescription refill requests, have your pharmacy contact our office and allow 72 hours.    Cancer Center Support Programs:   > Cancer Support Group  2nd Tuesday of the month 1pm-2pm, Journey Room

## 2019-02-03 NOTE — Assessment & Plan Note (Signed)
1.  Stage I (PT1CNX) HER-2 positive left breast cancer: - Left mastectomy on 08/19/2018, 1.1 cm IDC, grade 1, margins negative.  No lymph nodes were identified.  ER was 100% positive, PR 7% positive and HER-2 3+. - Weekly paclitaxel and Herceptin from 10/22/2018 through 01/14/2019 with Taxol dose reduced at 40 mg per metered square. - Echocardiogram on 01/25/2019 shows ejection fraction of 60 to 65%.  Some degree of aortic stenosis with left ventricular wall thickness was noted. - We talked about starting her on Herceptin every 3 weeks.  We talked about side effects in detail.  She will receive for total of 9 months. - She has tried anastrozole in December 2019.  It made her confused and forgetful. -We talked about starting her on letrozole 2.5 mg daily for at least 5 years.  We talked about side effects in detail.  We will send a prescription to her pharmacy. - She will proceed with her Herceptin today.  She will be seen back in 3 weeks and see how she is tolerating letrozole.  2.  Osteopenia: -DEXA scan on 02/08/2018 shows T score of -1.2. -She is receiving Prolia every 6 months with Dr. Melina Copa. -She will continue calcium and vitamin D supplements.  3.  Recurrent DVT: -She had multiple episodes of DVTs.  She is on Eliquis and is tolerating it well.  4.  Ulcerative colitis: - She is on mesalamine and Humira.

## 2019-02-10 ENCOUNTER — Other Ambulatory Visit (HOSPITAL_COMMUNITY): Payer: Medicare Other

## 2019-02-10 ENCOUNTER — Ambulatory Visit (HOSPITAL_COMMUNITY): Payer: Medicare Other | Admitting: Hematology

## 2019-02-10 ENCOUNTER — Ambulatory Visit (HOSPITAL_COMMUNITY): Payer: Medicare Other

## 2019-02-24 ENCOUNTER — Encounter (HOSPITAL_COMMUNITY): Payer: Self-pay | Admitting: Hematology

## 2019-02-24 ENCOUNTER — Inpatient Hospital Stay (HOSPITAL_COMMUNITY): Payer: Medicare Other

## 2019-02-24 ENCOUNTER — Other Ambulatory Visit: Payer: Self-pay

## 2019-02-24 ENCOUNTER — Inpatient Hospital Stay (HOSPITAL_COMMUNITY): Payer: Medicare Other | Attending: Hematology

## 2019-02-24 ENCOUNTER — Inpatient Hospital Stay (HOSPITAL_COMMUNITY): Payer: Medicare Other | Admitting: Hematology

## 2019-02-24 VITALS — BP 146/56 | HR 62 | Temp 97.9°F | Resp 17

## 2019-02-24 DIAGNOSIS — Z86718 Personal history of other venous thrombosis and embolism: Secondary | ICD-10-CM

## 2019-02-24 DIAGNOSIS — M791 Myalgia, unspecified site: Secondary | ICD-10-CM | POA: Diagnosis not present

## 2019-02-24 DIAGNOSIS — C50212 Malignant neoplasm of upper-inner quadrant of left female breast: Secondary | ICD-10-CM

## 2019-02-24 DIAGNOSIS — M8580 Other specified disorders of bone density and structure, unspecified site: Secondary | ICD-10-CM

## 2019-02-24 DIAGNOSIS — Z79811 Long term (current) use of aromatase inhibitors: Secondary | ICD-10-CM

## 2019-02-24 DIAGNOSIS — Z170 Estrogen receptor positive status [ER+]: Secondary | ICD-10-CM | POA: Diagnosis not present

## 2019-02-24 DIAGNOSIS — Z79899 Other long term (current) drug therapy: Secondary | ICD-10-CM | POA: Insufficient documentation

## 2019-02-24 DIAGNOSIS — M2550 Pain in unspecified joint: Secondary | ICD-10-CM | POA: Insufficient documentation

## 2019-02-24 DIAGNOSIS — Z87891 Personal history of nicotine dependence: Secondary | ICD-10-CM | POA: Insufficient documentation

## 2019-02-24 DIAGNOSIS — R200 Anesthesia of skin: Secondary | ICD-10-CM

## 2019-02-24 DIAGNOSIS — M25472 Effusion, left ankle: Secondary | ICD-10-CM

## 2019-02-24 DIAGNOSIS — Z7901 Long term (current) use of anticoagulants: Secondary | ICD-10-CM

## 2019-02-24 DIAGNOSIS — K51911 Ulcerative colitis, unspecified with rectal bleeding: Secondary | ICD-10-CM | POA: Diagnosis not present

## 2019-02-24 DIAGNOSIS — Z5112 Encounter for antineoplastic immunotherapy: Secondary | ICD-10-CM | POA: Diagnosis present

## 2019-02-24 LAB — CBC WITH DIFFERENTIAL/PLATELET
Abs Immature Granulocytes: 0.02 10*3/uL (ref 0.00–0.07)
Basophils Absolute: 0 10*3/uL (ref 0.0–0.1)
Basophils Relative: 0 %
Eosinophils Absolute: 0.3 10*3/uL (ref 0.0–0.5)
Eosinophils Relative: 4 %
HCT: 37.8 % (ref 36.0–46.0)
Hemoglobin: 12.2 g/dL (ref 12.0–15.0)
Immature Granulocytes: 0 %
Lymphocytes Relative: 36 %
Lymphs Abs: 2.6 10*3/uL (ref 0.7–4.0)
MCH: 30.8 pg (ref 26.0–34.0)
MCHC: 32.3 g/dL (ref 30.0–36.0)
MCV: 95.5 fL (ref 80.0–100.0)
Monocytes Absolute: 0.8 10*3/uL (ref 0.1–1.0)
Monocytes Relative: 11 %
Neutro Abs: 3.6 10*3/uL (ref 1.7–7.7)
Neutrophils Relative %: 49 %
Platelets: 230 10*3/uL (ref 150–400)
RBC: 3.96 MIL/uL (ref 3.87–5.11)
RDW: 14.1 % (ref 11.5–15.5)
WBC: 7.3 10*3/uL (ref 4.0–10.5)
nRBC: 0 % (ref 0.0–0.2)

## 2019-02-24 LAB — COMPREHENSIVE METABOLIC PANEL
ALT: 17 U/L (ref 0–44)
AST: 20 U/L (ref 15–41)
Albumin: 3.9 g/dL (ref 3.5–5.0)
Alkaline Phosphatase: 30 U/L — ABNORMAL LOW (ref 38–126)
Anion gap: 9 (ref 5–15)
BUN: 21 mg/dL (ref 8–23)
CO2: 26 mmol/L (ref 22–32)
Calcium: 8.9 mg/dL (ref 8.9–10.3)
Chloride: 104 mmol/L (ref 98–111)
Creatinine, Ser: 1.05 mg/dL — ABNORMAL HIGH (ref 0.44–1.00)
GFR calc Af Amer: 58 mL/min — ABNORMAL LOW (ref >60)
GFR calc non Af Amer: 50 mL/min — ABNORMAL LOW (ref >60)
Glucose, Bld: 100 mg/dL — ABNORMAL HIGH (ref 70–99)
Potassium: 4 mmol/L (ref 3.5–5.1)
Sodium: 139 mmol/L (ref 135–145)
Total Bilirubin: 0.8 mg/dL (ref 0.3–1.2)
Total Protein: 7 g/dL (ref 6.5–8.1)

## 2019-02-24 MED ORDER — SODIUM CHLORIDE 0.9 % IV SOLN
Freq: Once | INTRAVENOUS | Status: AC
  Administered 2019-02-24: 09:00:00 via INTRAVENOUS

## 2019-02-24 MED ORDER — TRASTUZUMAB CHEMO 150 MG IV SOLR
450.0000 mg | Freq: Once | INTRAVENOUS | Status: AC
  Administered 2019-02-24: 450 mg via INTRAVENOUS
  Filled 2019-02-24: qty 21.43

## 2019-02-24 MED ORDER — DIPHENHYDRAMINE HCL 25 MG PO CAPS: 50.0000 mg | ORAL_CAPSULE | Freq: Once | ORAL | Status: DC

## 2019-02-24 MED ORDER — ACETAMINOPHEN 325 MG PO TABS: 650.0000 mg | ORAL_TABLET | Freq: Once | ORAL | Status: DC

## 2019-02-24 MED ORDER — HEPARIN SOD (PORK) LOCK FLUSH 100 UNIT/ML IV SOLN
500.0000 [IU] | Freq: Once | INTRAVENOUS | Status: AC | PRN
  Administered 2019-02-24: 500 [IU]

## 2019-02-24 MED ORDER — SODIUM CHLORIDE 0.9% FLUSH
10.0000 mL | INTRAVENOUS | Status: DC | PRN
  Administered 2019-02-24: 10 mL
  Filled 2019-02-24: qty 10

## 2019-02-24 NOTE — Progress Notes (Signed)
Tolerated infusion w/o adverse reaction.  Alert, in no distress.  VSS.  Discharged ambulatory.  

## 2019-02-24 NOTE — Assessment & Plan Note (Addendum)
1.  Stage I (PT1CNX) HER-2 positive left breast cancer: -Left mastectomy on 08/19/2018, 1.1 cm IDC, grade 1, margins negative.  No lymph nodes were identified.  ER was 100% positive, PR 7% positive and HER-2 3+. - 12 doses of weekly paclitaxel and Herceptin from 10/22/2018 through 01/14/2019. - Last echocardiogram on 01/25/2019 shows EF of 60 to 65%.  Some degree of left ear with left ventricular wall thickness. - Started on Herceptin every 3 weeks on 02/03/2019. - She has tried anastrozole in December 2019 made her confused and forgetful. -She is started on Femara 2.5 mg daily on 02/03/2019.  She has tolerated Femara so far very well.  She did not have any confusion episodes. - She denies any symptoms of PND or orthopnea.  She may proceed with her Herceptin today.  I have reviewed her labs.  2.  Osteopenia: -DEXA scan on 02/08/2018 shows T score of -1.2. -She is receiving Prolia every 6 months with Dr. Melina Copa. -She was counseled to continue calcium and vitamin D supplements.  3.  Recurrent DVTs: -She has multiple episodes of DVTs and is on Eliquis.  She is tolerating it very well.  4.  Ulcerative colitis: -She is on mesalamine and Humira. -She has noticed some rectal bleeding in the last week.  She was told to call Dr. Olevia Perches office.

## 2019-02-24 NOTE — Patient Instructions (Addendum)
[place] at [facility] Discharge Instructions  You were seen today by Dr. Delton Coombes. He went over your recent lab results. He will see you back in 3 weeks for labs and follow up.   Thank you for choosing [place] at [facility] to provide your oncology and hematology care.  To afford each patient quality time with our provider, please arrive at least 15 minutes before your scheduled appointment time.   If you have a lab appointment with the [facility] please come in thru the  Main Entrance and check in at the main information desk  You need to re-schedule your appointment should you arrive 10 or more minutes late.  We strive to give you quality time with our providers, and arriving late affects you and other patients whose appointments are after yours.  Also, if you no show three or more times for appointments you may be dismissed from the clinic at the providers discretion.     Again, thank you for choosing [facility].  Our hope is that these requests will decrease the amount of time that you wait before being seen by our physicians.       _____________________________________________________________  Should you have questions after your visit to [facility], please contact our office at [phone] between the hours of 8:00 a.m. and 4:30 p.m.  Voicemails left after 4:00 p.m. will not be returned until the following business day.  For prescription refill requests, have your pharmacy contact our office and allow 72 hours.    Cancer Center Support Programs:   > Cancer Support Group  2nd Tuesday of the month 1pm-2pm, Journey Room

## 2019-02-24 NOTE — Progress Notes (Signed)
[place], [place] [address]   CLINIC:  Medical Oncology/Hematology  PCP:  Monique Graves, DO [address] [place] [address] [phone]   REASON FOR VISIT:  Follow-up for left breast cancer    BRIEF ONCOLOGIC HISTORY:  Oncology History  Breast cancer of upper-inner quadrant of left female breast ([facility])  07/21/2018 Initial Diagnosis   Breast cancer of upper-inner quadrant of left female breast ([facility])   10/22/2018 -  Chemotherapy   The patient had trastuzumab (HERCEPTIN) 300 mg in sodium chloride 0.9 % 250 mL chemo infusion, 315 mg, Intravenous,  Once, 5 of 16 cycles Administration: 300 mg (10/22/2018), 150 mg (10/29/2018), 150 mg (11/19/2018), 450 mg (02/03/2019), 150 mg (11/05/2018), 150 mg (11/12/2018), 150 mg (12/01/2018), 150 mg (12/10/2018), 150 mg (12/17/2018), 150 mg (12/24/2018), 150 mg (12/31/2018), 150 mg (01/07/2019), 150 mg (01/14/2019) PACLitaxel (TAXOL) 72 mg in sodium chloride 0.9 % 150 mL chemo infusion (</= 42m/m2), 40 mg/m2 = 72 mg (50 % of original dose 80 mg/m2), Intravenous,  Once, 3 of 3 cycles Dose modification: 40 mg/m2 (50 % of original dose 80 mg/m2, Cycle 1, Reason: Provider Judgment) Administration: 72 mg (10/22/2018), 72 mg (10/29/2018), 72 mg (11/19/2018), 72 mg (11/05/2018), 72 mg (11/12/2018), 72 mg (12/01/2018), 72 mg (12/10/2018), 72 mg (12/17/2018), 72 mg (12/24/2018), 72 mg (12/31/2018), 72 mg (01/07/2019), 72 mg (01/14/2019)  for chemotherapy treatment.       CANCER STAGING: Cancer Staging No matching staging information was found for the patient.   INTERVAL HISTORY:  Ms. [id number]. female seen for follow-up of breast cancer.  She is on every 3 weeks Herceptin regimen at this time.  She completed last dose of Taxol on 01/14/2019.  Appetite is 100%.  Energy levels are 75%.  She reports some blood in the stool over last week.  She has a history of ulcerative colitis.  She is standing in touch with Dr. MLorie Apleyoffice.  She has  some numbness in the left hand digits 4 and 5.  No real numbness in the feet reported.  Left ankle swelling is also stable.  Denies any fevers or infections.  Denies any symptoms of PND or orthopnea.   REVIEW OF SYSTEMS:  Review of Systems  Cardiovascular: Positive for leg swelling.  Neurological: Positive for numbness.  All other systems reviewed and are negative.    PAST MEDICAL/SURGICAL HISTORY:  Past Medical History:  Diagnosis Date  . Adult idiopathic generalized osteoporosis   . Arthritis    "minor" (08/18/2018)  . Breast cancer, [place] breast ([facility] [place]   lumpectomy  . Breast cancer, right breast ([facility] 2009   lumpectomy; mastectomy  . DVT (deep venous thrombosis) ([facility] "early 2000's"   RLE  . [place]deep [place]) ([facility]   . Hemorrhoids, external   . History of blood transfusion    "related to ulcerative colitis"  . Lower abdominal pain   . Occult blood in stools   . Peripheral edema   . Seizures ([facility]    one very slight seizure with the stroke; somewhere between 2005-2009  . Stroke ([facility] 2005-2009   "very light;" ; denies residual on 08/19/2018  . UC (ulcerative colitis) ([facility] [place] [address]  Past Surgical History:  Procedure Laterality Date  . APPENDECTOMY    . bone density  12/11/10  . BREAST BIOPSY Left 1998; 2019 X 2  . BREAST BIOPSY Right 2009  . BREAST LUMPECTOMY Left 1998  . CATARACT  EXTRACTION W/ INTRAOCULAR LENS  IMPLANT, BILATERAL Bilateral   . COLONOSCOPY  02/21/2011  . COLONOSCOPY  10/06/2008  . COLONOSCOPY  12/27/01  . COLONOSCOPY  05/09/05  . COLONOSCOPY N/A 07/02/2016   Procedure: COLONOSCOPY;  Surgeon: [name], MD;  Location: [facility];  Service: Endoscopy;  Laterality: N/A;  1055  . [place]   "I was having rectal bleeding"  . FRACTURE SURGERY    . MASTECTOMY Right 2009  . MASTECTOMY COMPLETE / SIMPLE Left 08/19/2018  . PORTACATH PLACEMENT Right 10/15/2018   Procedure: INSERTION PORT-A-CATH RIGHT SUBCLAVIN;   Surgeon: [name], MD;  Location: [facility] ORS;  Service: General;  Laterality: Right;  pt knows to arrive at 7:30  . SIMPLE MASTECTOMY WITH AXILLARY SENTINEL NODE BIOPSY Left 08/19/2018   Procedure: LEFT SIMPLE MASTECTOMY;  Surgeon: [name], MD;  Location: [facility];  Service: General;  Laterality: Left;  . TONSILLECTOMY    . VENA CAVA FILTER PLACEMENT Right   . WRIST FRACTURE SURGERY Right      SOCIAL HISTORY:  Social History   Socioeconomic History  . Marital status: Widowed    Spouse name: Not on file  . Number of children: 0  . Years of education: Not on file  . Highest education level: Not on file  Occupational History  . Occupation: Optometrist: [employer]  Social Needs  . Financial resource strain: Not hard at all  . Food insecurity    Worry: Never true    Inability: Never true  . Transportation needs    Medical: No    Non-medical: No  Tobacco Use  . Smoking status: Former Smoker    Packs/day: 2.00    Years: 40.00    Pack years: 80.00    Types: Cigarettes    Quit date: 05/05/2001    Years since quitting: 17.8  . Smokeless tobacco: Never Used  Substance and Sexual Activity  . Alcohol use: Yes    Comment: 08/18/2018 "couple drinks/year"  . Drug use: Never  . Sexual activity: Not Currently  Lifestyle  . Physical activity    Days per week: 0 days    Minutes per session: 0 min  . Stress: Not at all  Relationships  . Social connections    Talks on phone: More than three times a week    Gets together: More than three times a week    Attends religious service: More than 4 times per year    Active member of club or organization: Yes    Attends meetings of clubs or organizations: More than 4 times per year    Relationship status: Widowed  . Intimate partner violence    Fear of current or ex partner: No    Emotionally abused: No    Physically abused: No    Forced sexual activity: No  Other Topics Concern  . Not on file   Social History Narrative  . Not on file    FAMILY HISTORY:  Family History  Problem Relation Age of Onset  . Prostate cancer Father   . Colon cancer Neg Hx     CURRENT MEDICATIONS:  Outpatient Encounter Medications as of 02/24/2019  Medication Sig Note  . acetaminophen (TYLENOL) 500 MG tablet Take 500 mg by mouth every 6 (six) hours as needed for moderate pain or headache.    . Ascorbic Acid (VITAMIN C) 1000 MG tablet Take 1,000 mg by mouth daily.   . Calcium Carb-Cholecalciferol (  CALCIUM 500 + D3) 500-600 MG-UNIT TABS Take 1 tablet by mouth daily.    Marland Kitchen denosumab (PROLIA) 60 MG/ML SOLN injection Inject 60 mg into the skin every 6 (six) months. Administer in upper arm, thigh, or abdomen 10/07/2018: LAST DOSE:09/2018  . ELIQUIS 2.5 MG TABS tablet Take 2.5 mg by mouth 2 (two) times daily.    Marland Kitchen HUMIRA PEN 40 MG/0.8ML PNKT INJECT 40 MG SUBQ EVERY 14 DAYS. (Patient taking differently: Inject 40 mg as directed every 14 (fourteen) days. SATURDAYS.)   . letrozole (FEMARA) 2.5 MG tablet Take 1 tablet (2.5 mg total) by mouth daily.   Marland Kitchen lidocaine-prilocaine (EMLA) cream Apply to port site 1 hour prior to appointment 10/07/2018: NEW MEDICATION (PATIENT HAS NOT CHEMOTHERAPY TO DATE)  . Magnesium 500 MG TABS Take 500 mg by mouth daily.   . mesalamine (APRISO) 0.375 g 24 hr capsule Take 4 capsules by mouth each morning.   . Naphazoline-Pheniramine (OPCON-A) 0.027-0.315 % SOLN Place 1 drop into both eyes 3 (three) times daily as needed (dry eyes).  09/21/2018: As needed , rare per patient  . PACLitaxel (TAXOL IV) Inject into the vein once a week. Weekly x 12 cycles starting 10/22/2018 10/07/2018: NEW MEDICATION (PATIENT HAS NOT CHEMOTHERAPY TO DATE)   . polyethylene glycol powder (GLYCOLAX/MIRALAX) powder Take 8.5 g by mouth daily as needed. (Patient taking differently: Take 8.5 g by mouth daily as needed (constipation.). )   . PRESCRIPTION MEDICATION Apply 1 application topically 2 (two) times daily as needed  (eczema). Ketoconazole-fluticasone 1:1 compounded cream  08/03/2018: Compounded at [facility]  . prochlorperazine (COMPAZINE) 10 MG tablet Take 1 tablet (10 mg total) by mouth every 6 (six) hours as needed (Nausea or vomiting). 10/07/2018: NEW MEDICATION (PATIENT HAS NOT CHEMOTHERAPY TO DATE)   . traMADol (ULTRAM) 50 MG tablet Take 1 tablet (50 mg total) by mouth every 6 (six) hours as needed.   . Trastuzumab (HERCEPTIN IV) Inject into the vein every 21 ( twenty-one) days. Every 3 weeks x 1 year starting 10/22/2018 10/07/2018: NEW THERAPY PATIENT HAS NOT STARTED YET  . [DISCONTINUED] apixaban (ELIQUIS) 5 MG TABS tablet Take 2 tablets (10 mg total) by mouth 2 (two) times daily. Then 1 tablet (5 mg) two times daily starting 03/16/18 (Patient not taking: Reported on 02/24/2019)   . [DISCONTINUED] HUMIRA PEN 40 MG/0.8ML PNKT     No facility-administered encounter medications on file as of 02/24/2019.     ALLERGIES:  Allergies  Allergen Reactions  . Mercaptopurine Other (See Comments)    Dropped WBC  . Sulfa Antibiotics Other (See Comments)    Extreme Weakness     PHYSICAL EXAM:  ECOG Performance status: 1  Vitals:   02/24/19 0803  BP: 140/65  Pulse: 70  Resp: 16  Temp: (!) 97.1 F (36.2 C)  SpO2: 98%   Filed Weights   02/24/19 0803  Weight: 178 lb 11.2 oz (81.1 kg)    Physical Exam Vitals signs reviewed.  Constitutional:      Appearance: Normal appearance.  Cardiovascular:     Rate and Rhythm: Normal rate and regular rhythm.     Heart sounds: Normal heart sounds.  Pulmonary:     Effort: Pulmonary effort is normal.     Breath sounds: Normal breath sounds.  Abdominal:     General: There is no distension.     Palpations: Abdomen is soft. There is no mass.  Musculoskeletal:        General: No swelling.  Skin:  General: Skin is warm.  Neurological:     General: No focal deficit present.     Mental Status: She is alert and oriented to person, place, and time.  Psychiatric:         Mood and Affect: Mood normal.        Behavior: Behavior normal.    Very mild edema in the legs.  LABORATORY DATA:  I have reviewed the labs as listed.  CBC    Component Value Date/Time   WBC 7.3 02/24/2019 0810   RBC 3.96 02/24/2019 0810   HGB 12.2 02/24/2019 0810   HGB 14.4 03/10/2017 1254   HCT 37.8 02/24/2019 0810   HCT 41.5 03/10/2017 1254   PLT 230 02/24/2019 0810   PLT 249 03/10/2017 1254   MCV 95.5 02/24/2019 0810   MCV 91 03/10/2017 1254   MCH 30.8 02/24/2019 0810   MCHC 32.3 02/24/2019 0810   RDW 14.1 02/24/2019 0810   RDW 13.3 03/10/2017 1254   LYMPHSABS 2.6 02/24/2019 0810   LYMPHSABS 2.7 03/10/2017 1254   MONOABS 0.8 02/24/2019 0810   EOSABS 0.3 02/24/2019 0810   EOSABS 0.4 03/10/2017 1254   BASOSABS 0.0 02/24/2019 0810   BASOSABS 0.1 03/10/2017 1254   CMP Latest Ref Rng & Units 02/24/2019 02/03/2019 01/14/2019  Glucose 70 - 99 mg/dL 100(H) 111(H) 110(H)  BUN 8 - 23 mg/dL 21 16 20   Creatinine 0.44 - 1.00 mg/dL 1.05(H) 0.94 0.89  Sodium 135 - 145 mmol/L 139 140 140  Potassium 3.5 - 5.1 mmol/L 4.0 3.9 3.9  Chloride 98 - 111 mmol/L 104 106 106  CO2 22 - 32 mmol/L 26 22 23   Calcium 8.9 - 10.3 mg/dL 8.9 8.5(L) 8.5(L)  Total Protein 6.5 - 8.1 g/dL 7.0 6.5 6.3(L)  Total Bilirubin 0.3 - 1.2 mg/dL 0.8 0.6 0.7  Alkaline Phos 38 - 126 U/L 30(L) 30(L) 31(L)  AST 15 - 41 U/L 20 18 16   ALT 0 - 44 U/L 17 19 17        DIAGNOSTIC IMAGING:  I have independently reviewed the scans and discussed with the patient.     ASSESSMENT & PLAN:   Breast cancer of upper-inner quadrant of left female breast ([place]) 1.  Stage I (PT1CNX) HER-2 positive left breast cancer: -Left mastectomy on 08/19/2018, 1.1 cm IDC, grade 1, margins negative.  No lymph nodes were identified.  ER was 100% positive, PR 7% positive and HER-2 3+. - 12 doses of weekly paclitaxel and Herceptin from 10/22/2018 through 01/14/2019. - Last echocardiogram on 01/25/2019 shows EF of 60 to 65%.  Some degree of left  ear with left ventricular wall thickness. - Started on Herceptin every 3 weeks on 02/03/2019. - She has tried anastrozole in December 2019 made her confused and forgetful. -She is started on Femara 2.5 mg daily on 02/03/2019.  She has tolerated Femara so far very well.  She did not have any confusion episodes. - She denies any symptoms of PND or orthopnea.  She may proceed with her Herceptin today.  I have reviewed her labs.  2.  Osteopenia: -DEXA scan on 02/08/2018 shows T score of -1.2. -She is receiving Prolia every 6 months with Dr. Melina Copa. -She was counseled to continue calcium and vitamin D supplements.  3.  Recurrent DVTs: -She has multiple episodes of DVTs and is on Eliquis.  She is tolerating it very well.  4.  Ulcerative colitis: -She is on mesalamine and Humira. -She has noticed some rectal bleeding in the last  week.  She was told to call Dr. Olevia Perches office.  Total time spent is 25 minutes with more than 50% of the time spent face-to-face discussing treatment plan, surveillance and coordination of care.    Orders placed this encounter:  No orders of the defined types were placed in this encounter.     Derek Jack, MD [facility] [phone]

## 2019-03-18 ENCOUNTER — Other Ambulatory Visit (HOSPITAL_COMMUNITY): Payer: Medicare Other

## 2019-03-18 ENCOUNTER — Inpatient Hospital Stay (HOSPITAL_COMMUNITY): Payer: Medicare Other

## 2019-03-18 ENCOUNTER — Inpatient Hospital Stay (HOSPITAL_BASED_OUTPATIENT_CLINIC_OR_DEPARTMENT_OTHER): Payer: Medicare Other | Admitting: Hematology

## 2019-03-18 ENCOUNTER — Other Ambulatory Visit: Payer: Self-pay

## 2019-03-18 VITALS — BP 145/60 | HR 63 | Temp 97.7°F | Resp 18

## 2019-03-18 DIAGNOSIS — Z87891 Personal history of nicotine dependence: Secondary | ICD-10-CM

## 2019-03-18 DIAGNOSIS — M25472 Effusion, left ankle: Secondary | ICD-10-CM

## 2019-03-18 DIAGNOSIS — Z7901 Long term (current) use of anticoagulants: Secondary | ICD-10-CM

## 2019-03-18 DIAGNOSIS — Z170 Estrogen receptor positive status [ER+]: Secondary | ICD-10-CM

## 2019-03-18 DIAGNOSIS — R200 Anesthesia of skin: Secondary | ICD-10-CM

## 2019-03-18 DIAGNOSIS — C50212 Malignant neoplasm of upper-inner quadrant of left female breast: Secondary | ICD-10-CM

## 2019-03-18 DIAGNOSIS — M8580 Other specified disorders of bone density and structure, unspecified site: Secondary | ICD-10-CM

## 2019-03-18 DIAGNOSIS — Z86718 Personal history of other venous thrombosis and embolism: Secondary | ICD-10-CM

## 2019-03-18 DIAGNOSIS — M791 Myalgia, unspecified site: Secondary | ICD-10-CM

## 2019-03-18 DIAGNOSIS — M2550 Pain in unspecified joint: Secondary | ICD-10-CM

## 2019-03-18 DIAGNOSIS — Z5112 Encounter for antineoplastic immunotherapy: Secondary | ICD-10-CM | POA: Diagnosis not present

## 2019-03-18 DIAGNOSIS — Z79811 Long term (current) use of aromatase inhibitors: Secondary | ICD-10-CM

## 2019-03-18 DIAGNOSIS — K51911 Ulcerative colitis, unspecified with rectal bleeding: Secondary | ICD-10-CM

## 2019-03-18 DIAGNOSIS — Z79899 Other long term (current) drug therapy: Secondary | ICD-10-CM

## 2019-03-18 MED ORDER — ACETAMINOPHEN 325 MG PO TABS
650.0000 mg | ORAL_TABLET | Freq: Once | ORAL | Status: DC
  Filled 2019-03-18: qty 2

## 2019-03-18 MED ORDER — TRASTUZUMAB CHEMO 150 MG IV SOLR
450.0000 mg | Freq: Once | INTRAVENOUS | Status: AC
  Administered 2019-03-18: 450 mg via INTRAVENOUS
  Filled 2019-03-18: qty 21.43

## 2019-03-18 MED ORDER — SODIUM CHLORIDE 0.9 % IV SOLN
Freq: Once | INTRAVENOUS | Status: AC
  Administered 2019-03-18: 10:00:00 via INTRAVENOUS

## 2019-03-18 MED ORDER — DIPHENHYDRAMINE HCL 25 MG PO CAPS
50.0000 mg | ORAL_CAPSULE | Freq: Once | ORAL | Status: DC
  Filled 2019-03-18: qty 2

## 2019-03-18 MED ORDER — SODIUM CHLORIDE 0.9% FLUSH
10.0000 mL | INTRAVENOUS | Status: DC | PRN
  Administered 2019-03-18: 10 mL
  Filled 2019-03-18: qty 10

## 2019-03-18 MED ORDER — HEPARIN SOD (PORK) LOCK FLUSH 100 UNIT/ML IV SOLN
500.0000 [IU] | Freq: Once | INTRAVENOUS | Status: AC | PRN
  Administered 2019-03-18: 500 [IU]

## 2019-03-18 NOTE — Assessment & Plan Note (Signed)
1.  Stage I (PT1CNX) HER-2 positive left breast cancer: -Left mastectomy on 08/19/2018, 1.1 cm IDC, grade 1, margins negative.  No lymph nodes were identified.  ER was 100% positive, PR 7% positive and HER-2 3+. - 12 doses of weekly paclitaxel and Herceptin from 10/22/2018 through 01/14/2019. - Last echocardiogram on 01/25/2019 shows EF of 60 to 65%.  Some degree of left ear with left ventricular wall thickness. - Started on Herceptin every 3 weeks on 02/03/2019. - She has tried anastrozole in December 2019 made her confused and forgetful. -She is started on Femara 2.5 mg daily on 02/03/2019.  She has tolerated Femara so far very well.  She did not have any confusion episodes. - Labs are acceptable.  She may proceed with Herceptin today. -She will return to clinic in 3 weeks for follow-up.  2.  Osteopenia: -DEXA scan on 02/08/2018 shows T score of -1.2. -She is receiving Prolia every 6 months with Dr. Melina Copa. -She was counseled to continue calcium and vitamin D supplements.  3.  Recurrent DVTs: -She has multiple episodes of DVTs and is on Eliquis.  She is tolerating it very well.  4.  Ulcerative colitis: -She is on mesalamine and Humira. -She has noticed some rectal bleeding in the last week.  She was told to call Dr. Olevia Perches office.

## 2019-03-18 NOTE — Progress Notes (Signed)
Patient seen by Reynolds Bowl, NP, with verbal order ok to treat today.    Patient tolerated chemotherapy with no complaints voiced.  Port site clean and dry with no bruising or swelling noted at site.  Good blood return noted before and after administration of chemotherapy.  Band aid applied.  Patient left ambulatory with VSS and no s/s of distress noted.

## 2019-03-18 NOTE — Progress Notes (Signed)
[place], [place] [address]   CLINIC:  Medical Oncology/Hematology  PCP:  Octavio Graves, DO (Inactive) [place] [phone]   REASON FOR VISIT:  Follow-up for Breast Cancer   CURRENT THERAPY: Herceptin   BRIEF ONCOLOGIC HISTORY:  Oncology History  Breast cancer of upper-inner quadrant of left female breast ([facility])  07/21/2018 Initial Diagnosis   Breast cancer of upper-inner quadrant of left female breast ([facility])   10/22/2018 -  Chemotherapy   The patient had trastuzumab (HERCEPTIN) 300 mg in sodium chloride 0.9 % 250 mL chemo infusion, 315 mg, Intravenous,  Once, 6 of 16 cycles Dose modification: 0.06 mg/kg (1 % of original dose 6 mg/kg, Cycle 6, Reason: Other (see comments), Comment: need to resign), 6 mg/kg (original dose 6 mg/kg, Cycle 6, Reason: Other (see comments), Comment: need to reenter to sign ) Administration: 300 mg (10/22/2018), 150 mg (10/29/2018), 150 mg (11/19/2018), 450 mg (02/03/2019), 450 mg (02/24/2019), 150 mg (11/05/2018), 150 mg (11/12/2018), 150 mg (12/01/2018), 150 mg (12/10/2018), 150 mg (12/17/2018), 150 mg (12/24/2018), 150 mg (12/31/2018), 150 mg (01/07/2019), 150 mg (01/14/2019) PACLitaxel (TAXOL) 72 mg in sodium chloride 0.9 % 150 mL chemo infusion (</= 75m/m2), 40 mg/m2 = 72 mg (50 % of original dose 80 mg/m2), Intravenous,  Once, 3 of 3 cycles Dose modification: 40 mg/m2 (50 % of original dose 80 mg/m2, Cycle 1, Reason: Provider Judgment) Administration: 72 mg (10/22/2018), 72 mg (10/29/2018), 72 mg (11/19/2018), 72 mg (11/05/2018), 72 mg (11/12/2018), 72 mg (12/01/2018), 72 mg (12/10/2018), 72 mg (12/17/2018), 72 mg (12/24/2018), 72 mg (12/31/2018), 72 mg (01/07/2019), 72 mg (01/14/2019)  for chemotherapy treatment.         INTERVAL HISTORY:  Ms. [id number]. female    REVIEW OF SYSTEMS:  Review of Systems  Constitutional: Negative.   Cardiovascular: Positive for leg swelling.  Musculoskeletal: Positive  for arthralgias and myalgias.     PAST MEDICAL/SURGICAL HISTORY:  Past Medical History:  Diagnosis Date  . Adult idiopathic generalized osteoporosis   . Arthritis    "minor" (08/18/2018)  . Breast cancer, [place] breast ([facility]   lumpectomy  . Breast [place]   lumpectomy; mastectomy  . DVT (deep venous thrombosis) ([facility] "early 2000's"   RLE  . DVT (deep venous thrombosis) ([facility]   . Hemorrhoids, external   . History of blood transfusion    "related to ulcerative colitis"  . Lower abdominal pain   . Occult blood in stools   . Peripheral edema   . Seizures ([facility] [place]    one very slight seizure with the stroke; somewhere between 2005-2009  . Stroke ([facility] 2005-2009   "very light;" ; denies residual on 08/19/2018  . UC (ulcerative colitis) ([place] 11583  Past Surgical History:  Procedure Laterality Date  . APPENDECTOMY    . bone density  12/11/10  . BREAST BIOPSY Left 1998; 2019 X 2  . BREAST BIOPSY Right 2009  . BREAST LUMPECTOMY Left 1998  . CATARACT EXTRACTION W/ INTRAOCULAR LENS  IMPLANT, BILATERAL Bilateral   . COLONOSCOPY  02/21/2011  . COLONOSCOPY  10/06/2008  . COLONOSCOPY  12/27/01  . COLONOSCOPY  05/09/05  . COLONOSCOPY N/A 07/02/2016   Procedure: COLONOSCOPY;  Surgeon: [name] MD;  Location: [facility];  Service: Endoscopy;  Laterality: N/A;  1055  . [place]  "I was having rectal bleeding"  . FRACTURE SURGERY    .  MASTECTOMY Right 2009  . MASTECTOMY COMPLETE / SIMPLE Left 08/19/2018  . PORTACATH PLACEMENT Right 10/15/2018   Procedure: INSERTION PORT-A-CATH RIGHT SUBCLAVIN;  Surgeon: [name], MD;  Location: [facility] ORS;  Service: General;  Laterality: Right;  pt knows to arrive at 7:30  . SIMPLE MASTECTOMY WITH AXILLARY SENTINEL NODE BIOPSY Left 08/19/2018   Procedure: LEFT SIMPLE MASTECTOMY;  Surgeon: [name], MD;  Location: [facility];  Service: General;  Laterality: Left;  . TONSILLECTOMY    . VENA CAVA  FILTER PLACEMENT Right   . WRIST FRACTURE SURGERY Right      SOCIAL HISTORY:  Social History   Socioeconomic History  . Marital status: Widowed    Spouse name: Not on file  . Number of children: 0  . Years of education: Not on file  . Highest education level: Not on file  Occupational History  . Occupation: Optometrist: [place]  Social Needs  . Financial resource strain: Not hard at all  . Food insecurity    Worry: Never true    Inability: Never true  . Transportation needs    Medical: No    Non-medical: No  Tobacco Use  . Smoking status: Former Smoker    Packs/day: 2.00    Years: 40.00    Pack years: 80.00    Types: Cigarettes    Quit date: 05/05/2001    Years since quitting: 17.8  . Smokeless tobacco: Never Used  Substance and Sexual Activity  . Alcohol use: Yes    Comment: 08/18/2018 "couple drinks/year"  . Drug use: Never  . Sexual activity: Not Currently  Lifestyle  . Physical activity    Days per week: 0 days    Minutes per session: 0 min  . Stress: Not at all  Relationships  . Social connections    Talks on phone: More than three times a week    Gets together: More than three times a week    Attends religious service: More than 4 times per year    Active member of club or organization: Yes    Attends meetings of clubs or organizations: More than 4 times per year    Relationship status: Widowed  . Intimate partner violence    Fear of current or ex partner: No    Emotionally abused: No    Physically abused: No    Forced sexual activity: No  Other Topics Concern  . Not on file  Social History Narrative  . Not on file    FAMILY HISTORY:  Family History  Problem Relation Age of Onset  . Prostate cancer Father   . Colon cancer Neg Hx     CURRENT MEDICATIONS:  Outpatient Encounter Medications as of 03/18/2019  Medication Sig Note  . acetaminophen (TYLENOL) 500 MG tablet Take 500 mg by mouth every 6 (six) hours  as needed for moderate pain or headache.    . Ascorbic Acid (VITAMIN C) 1000 MG tablet Take 1,000 mg by mouth daily.   . Calcium Carb-Cholecalciferol (CALCIUM 500 + D3) 500-600 MG-UNIT TABS Take 1 tablet by mouth daily.    Marland Kitchen denosumab (PROLIA) 60 MG/ML SOLN injection Inject 60 mg into the skin every 6 (six) months. Administer in upper arm, thigh, or abdomen 10/07/2018: LAST DOSE:09/2018  . ELIQUIS 2.5 MG TABS tablet Take 2.5 mg by mouth 2 (two) times daily.    Marland Kitchen HUMIRA PEN 40 MG/0.8ML PNKT INJECT 40 MG SUBQ EVERY 14 DAYS. (Patient taking differently:  Inject 40 mg as directed every 14 (fourteen) days. SATURDAYS.)   . letrozole (FEMARA) 2.5 MG tablet Take 1 tablet (2.5 mg total) by mouth daily.   Marland Kitchen lidocaine-prilocaine (EMLA) cream Apply to port site 1 hour prior to appointment 10/07/2018: NEW MEDICATION (PATIENT HAS NOT CHEMOTHERAPY TO DATE)  . Magnesium 500 MG TABS Take 500 mg by mouth daily.   . mesalamine (APRISO) 0.375 g 24 hr capsule Take 4 capsules by mouth each morning.   . Naphazoline-Pheniramine (OPCON-A) 0.027-0.315 % SOLN Place 1 drop into both eyes 3 (three) times daily as needed (dry eyes).  09/21/2018: As needed , rare per patient  . PACLitaxel (TAXOL IV) Inject into the vein once a week. Weekly x 12 cycles starting 10/22/2018 10/07/2018: NEW MEDICATION (PATIENT HAS NOT CHEMOTHERAPY TO DATE)   . polyethylene glycol powder (GLYCOLAX/MIRALAX) powder Take 8.5 g by mouth daily as needed. (Patient taking differently: Take 8.5 g by mouth daily as needed (constipation.). )   . PRESCRIPTION MEDICATION Apply 1 application topically 2 (two) times daily as needed (eczema). Ketoconazole-fluticasone 1:1 compounded cream  08/03/2018: Compounded at [facility]  . prochlorperazine (COMPAZINE) 10 MG tablet Take 1 tablet (10 mg total) by mouth every 6 (six) hours as needed (Nausea or vomiting). (Patient not taking: Reported on 03/18/2019) 10/07/2018: NEW MEDICATION (PATIENT HAS NOT CHEMOTHERAPY TO DATE)   . traMADol  (ULTRAM) 50 MG tablet Take 1 tablet (50 mg total) by mouth every 6 (six) hours as needed. (Patient not taking: Reported on 03/18/2019)   . Trastuzumab (HERCEPTIN IV) Inject into the vein every 21 ( twenty-one) days. Every 3 weeks x 1 year starting 10/22/2018 10/07/2018: NEW THERAPY PATIENT HAS NOT STARTED YET   No facility-administered encounter medications on file as of 03/18/2019.     ALLERGIES:  Allergies  Allergen Reactions  . Mercaptopurine Other (See Comments)    Dropped WBC  . Sulfa Antibiotics Other (See Comments)    Extreme Weakness     PHYSICAL EXAM:  ECOG Performance status: 1  Vitals:   03/18/19 0857  BP: (!) 142/69  Pulse: 80  Resp: 18  Temp: 97.9 F (36.6 C)  SpO2: 97%   Filed Weights   03/18/19 0857  Weight: 178 lb 9.6 oz (81 kg)    Physical Exam Constitutional:      Appearance: Normal appearance. She is obese.  HENT:     Head: Normocephalic.     Nose: Nose normal.     Mouth/Throat:     Mouth: Mucous membranes are moist.     Pharynx: Oropharynx is clear.  Eyes:     Extraocular Movements: Extraocular movements intact.     Conjunctiva/sclera: Conjunctivae normal.  Cardiovascular:     Rate and Rhythm: Normal rate and regular rhythm.     Pulses: Normal pulses.     Heart sounds: Normal heart sounds.  Pulmonary:     Effort: Pulmonary effort is normal.     Breath sounds: Normal breath sounds.  Abdominal:     General: Bowel sounds are normal.     Palpations: Abdomen is soft.  Musculoskeletal: Normal range of motion.  Skin:    General: Skin is warm.  Neurological:     General: No focal deficit present.     Mental Status: She is alert and oriented to person, place, and time.  Psychiatric:        Mood and Affect: Mood normal.        Behavior: Behavior normal.  Thought Content: Thought content normal.        Judgment: Judgment normal.      LABORATORY DATA:  I have reviewed the labs as listed.  CBC    Component Value Date/Time   WBC 7.3  02/24/2019 0810   RBC 3.96 02/24/2019 0810   HGB 12.2 02/24/2019 0810   HGB 14.4 03/10/2017 1254   HCT 37.8 02/24/2019 0810   HCT 41.5 03/10/2017 1254   PLT 230 02/24/2019 0810   PLT 249 03/10/2017 1254   MCV 95.5 02/24/2019 0810   MCV 91 03/10/2017 1254   MCH 30.8 02/24/2019 0810   MCHC 32.3 02/24/2019 0810   RDW 14.1 02/24/2019 0810   RDW 13.3 03/10/2017 1254   LYMPHSABS 2.6 02/24/2019 0810   LYMPHSABS 2.7 03/10/2017 1254   MONOABS 0.8 02/24/2019 0810   EOSABS 0.3 02/24/2019 0810   EOSABS 0.4 03/10/2017 1254   BASOSABS 0.0 02/24/2019 0810   BASOSABS 0.1 03/10/2017 1254   CMP Latest Ref Rng & Units 02/24/2019 02/03/2019 01/14/2019  Glucose 70 - 99 mg/dL 100(H) 111(H) 110(H)  BUN 8 - 23 mg/dL 21 16 20   Creatinine 0.44 - 1.00 mg/dL 1.05(H) 0.94 0.89  Sodium 135 - 145 mmol/L 139 140 140  Potassium 3.5 - 5.1 mmol/L 4.0 3.9 3.9  Chloride 98 - 111 mmol/L 104 106 106  CO2 22 - 32 mmol/L 26 22 23   Calcium 8.9 - 10.3 mg/dL 8.9 8.5(L) 8.5(L)  Total Protein 6.5 - 8.1 g/dL 7.0 6.5 6.3(L)  Total Bilirubin 0.3 - 1.2 mg/dL 0.8 0.6 0.7  Alkaline Phos 38 - 126 U/L 30(L) 30(L) 31(L)  AST 15 - 41 U/L 20 18 16   ALT 0 - 44 U/L 17 19 17        ASSESSMENT & PLAN:   Breast cancer of upper-inner quadrant of left female breast (HCC) 1.  Stage I (PT1CNX) HER-2 positive left breast cancer: -Left mastectomy on 08/19/2018, 1.1 cm IDC, grade 1, margins negative.  No lymph nodes were identified.  ER was 100% positive, PR 7% positive and HER-2 3+. - 12 doses of weekly paclitaxel and Herceptin from 10/22/2018 through 01/14/2019. - Last echocardiogram on 01/25/2019 shows EF of 60 to 65%.  Some degree of left ear with left ventricular wall thickness. - Started on Herceptin every 3 weeks on 02/03/2019. - She has tried anastrozole in December 2019 made her confused and forgetful. -She is started on Femara 2.5 mg daily on 02/03/2019.  She has tolerated Femara so far very well.  She did not have any confusion  episodes. - Labs are acceptable.  She may proceed with Herceptin today. -She will return to clinic in 3 weeks for follow-up.  2.  Osteopenia: -DEXA scan on 02/08/2018 shows T score of -1.2. -She is receiving Prolia every 6 months with Dr. Melina Copa. -She was counseled to continue calcium and vitamin D supplements.  3.  Recurrent DVTs: -She has multiple episodes of DVTs and is on Eliquis.  She is tolerating it very well.  4.  Ulcerative colitis: -She is on mesalamine and Humira. -She has noticed some rectal bleeding in the last week.  She was told to call Dr. Olevia Perches office.      Orders placed this encounter:  Orders Placed This Encounter  Procedures  . CBC with Differential  . Comprehensive metabolic panel  . Vitamin D 25 hydroxy     Roger Shelter, [place] [phone]

## 2019-03-22 ENCOUNTER — Encounter (INDEPENDENT_AMBULATORY_CARE_PROVIDER_SITE_OTHER): Payer: Self-pay | Admitting: Internal Medicine

## 2019-03-22 ENCOUNTER — Other Ambulatory Visit: Payer: Self-pay

## 2019-03-22 ENCOUNTER — Ambulatory Visit (INDEPENDENT_AMBULATORY_CARE_PROVIDER_SITE_OTHER): Payer: Medicare Other | Admitting: Internal Medicine

## 2019-03-22 DIAGNOSIS — K51011 Ulcerative (chronic) pancolitis with rectal bleeding: Secondary | ICD-10-CM | POA: Diagnosis not present

## 2019-03-22 DIAGNOSIS — K5100 Ulcerative (chronic) pancolitis without complications: Secondary | ICD-10-CM | POA: Insufficient documentation

## 2019-03-22 DIAGNOSIS — K5190 Ulcerative colitis, unspecified, without complications: Secondary | ICD-10-CM | POA: Insufficient documentation

## 2019-03-22 MED ORDER — HYDROCORTISONE (PERIANAL) 2.5 % EX CREA: 1.0000 "application " | TOPICAL_CREAM | Freq: Every day | CUTANEOUS | 1 refills | Status: DC

## 2019-03-22 NOTE — Patient Instructions (Signed)
Please keep symptom diary as to frequency of bleeding episodes and call with progress report in 2 to 3 weeks. Increase intake of fiber rich foods as tolerated.

## 2019-03-22 NOTE — Progress Notes (Signed)
Presenting complaint;  Follow-up for ulcerative colitis.  Database and subjective:  Patient is 83 year old Caucasian female who has over 50-year history of ulcerative colitis who is here for scheduled visit. She was last seen 6 months ago and she was doing well. She states she has few months of Herceptin which she receives every 3 weeks.  She will finish treatment in December. Patient reports change in bowel habits since her mesalamine was switched to Apriso as this was preferred medication on her plan.  She has noted rectal bleeding.  Bleeding first occurred on 02/16/2019 when she was constipated and had strain.  Since then she has noted lower abdominal pain and sees blood virtually every day with her bowel movement.  It is fresh and small in amount.  She usually has 3 bowel movements per day.  Each time she passes small amount of stool.  She has noted change in her caliber.  Her appetite is good and she has gained 6 pounds.  She has developed lower extremity edema which is felt to be due to Herceptin. Last colonoscopy was in November 2017 when she was felt to be in remission.  Current Medications: Outpatient Encounter Medications as of 03/22/2019  Medication Sig  . acetaminophen (TYLENOL) 500 MG tablet Take 500 mg by mouth every 6 (six) hours as needed for moderate pain or headache.   . Ascorbic Acid (VITAMIN C) 1000 MG tablet Take 1,000 mg by mouth daily.  . Calcium Carb-Cholecalciferol (CALCIUM 500 + D3) 500-600 MG-UNIT TABS Take 1 tablet by mouth daily.   Marland Kitchen denosumab (PROLIA) 60 MG/ML SOLN injection Inject 60 mg into the skin every 6 (six) months. Administer in upper arm, thigh, or abdomen  . ELIQUIS 2.5 MG TABS tablet Take 2.5 mg by mouth 2 (two) times daily.   Marland Kitchen HUMIRA PEN 40 MG/0.8ML PNKT INJECT 40 MG SUBQ EVERY 14 DAYS. (Patient taking differently: Inject 40 mg as directed every 14 (fourteen) days. SATURDAYS.)  . letrozole (FEMARA) 2.5 MG tablet Take 1 tablet (2.5 mg total) by mouth daily.   Marland Kitchen lidocaine-prilocaine (EMLA) cream Apply to port site 1 hour prior to appointment  . Magnesium 500 MG TABS Take 500 mg by mouth daily.  . mesalamine (APRISO) 0.375 g 24 hr capsule Take 4 capsules by mouth each morning.  . Naphazoline-Pheniramine (OPCON-A) 0.027-0.315 % SOLN Place 1 drop into both eyes 3 (three) times daily as needed (dry eyes).   . polyethylene glycol powder (GLYCOLAX/MIRALAX) powder Take 8.5 g by mouth daily as needed. (Patient taking differently: Take 8.5 g by mouth daily as needed (constipation.). )  . PRESCRIPTION MEDICATION Apply 1 application topically 2 (two) times daily as needed (eczema). Ketoconazole-fluticasone 1:1 compounded cream   . Trastuzumab (HERCEPTIN IV) Inject into the vein every 21 ( twenty-one) days. Every 3 weeks x 1 year starting 10/22/2018  . PACLitaxel (TAXOL IV) Inject into the vein once a week. Weekly x 12 cycles starting 10/22/2018  . prochlorperazine (COMPAZINE) 10 MG tablet Take 1 tablet (10 mg total) by mouth every 6 (six) hours as needed (Nausea or vomiting). (Patient not taking: Reported on 03/18/2019)  . traMADol (ULTRAM) 50 MG tablet Take 1 tablet (50 mg total) by mouth every 6 (six) hours as needed. (Patient not taking: Reported on 03/18/2019)   No facility-administered encounter medications on file as of 03/22/2019.      Objective: Blood pressure (!) 149/84, pulse 76, temperature 98.3 F (36.8 C), temperature source Oral, resp. rate 18, height 5' 3"  (1.6 m),  weight 177 lb 14.4 oz (80.7 kg). Patient is alert and in no acute distress. Conjunctiva is pink. Sclera is nonicteric Oropharyngeal mucosa is normal. No neck masses or thyromegaly noted. Cardiac exam with regular rhythm normal S1 and S2. No murmur or gallop noted. Lungs are clear to auscultation. Abdomen is symmetrical soft and nontender with organomegaly or masses. No LE edema or clubbing noted.  Labs/studies Results:  CBC Latest Ref Rng & Units 02/24/2019 02/03/2019 01/14/2019  WBC 4.0  - 10.5 K/uL 7.3 6.3 6.2  Hemoglobin 12.0 - 15.0 g/dL 12.2 12.0 11.6(L)  Hematocrit 36.0 - 46.0 % 37.8 38.2 36.4  Platelets 150 - 400 K/uL 230 231 218    CMP Latest Ref Rng & Units 02/24/2019 02/03/2019 01/14/2019  Glucose 70 - 99 mg/dL 100(H) 111(H) 110(H)  BUN 8 - 23 mg/dL 21 16 20   Creatinine 0.44 - 1.00 mg/dL 1.05(H) 0.94 0.89  Sodium 135 - 145 mmol/L 139 140 140  Potassium 3.5 - 5.1 mmol/L 4.0 3.9 3.9  Chloride 98 - 111 mmol/L 104 106 106  CO2 22 - 32 mmol/L 26 22 23   Calcium 8.9 - 10.3 mg/dL 8.9 8.5(L) 8.5(L)  Total Protein 6.5 - 8.1 g/dL 7.0 6.5 6.3(L)  Total Bilirubin 0.3 - 1.2 mg/dL 0.8 0.6 0.7  Alkaline Phos 38 - 126 U/L 30(L) 30(L) 31(L)  AST 15 - 41 U/L 20 18 16   ALT 0 - 44 U/L 17 19 17     Hepatic Function Latest Ref Rng & Units 02/24/2019 02/03/2019 01/14/2019  Total Protein 6.5 - 8.1 g/dL 7.0 6.5 6.3(L)  Albumin 3.5 - 5.0 g/dL 3.9 3.7 3.4(L)  AST 15 - 41 U/L 20 18 16   ALT 0 - 44 U/L 17 19 17   Alk Phosphatase 38 - 126 U/L 30(L) 30(L) 31(L)  Total Bilirubin 0.3 - 1.2 mg/dL 0.8 0.6 0.7     Assessment:  #1.  Hematochezia.  Hematochezia started about a month ago when she was constipated but has not stopped even though she is not having to strain anymore.  Last colonoscopy did reveal hemorrhoids.  Suspect bleeding from hemorrhoids in the setting of anticoagulation.  If she does not respond to topical therapy she may need further evaluation.  #2.  Chronic ulcerative colitis.  She will continue adalimumab and Apriso.  She may need flexible sigmoidoscopy if hematochezia continues.   Plan:  Patient advised to increase intake of fiber rich foods as tolerated. Anusol HC cream to be applied to anal canal daily at bedtime for 2 weeks. Patient will call with progress report in 2 to 3 weeks. Office visit in 6 months.

## 2019-04-04 ENCOUNTER — Telehealth (INDEPENDENT_AMBULATORY_CARE_PROVIDER_SITE_OTHER): Payer: Self-pay | Admitting: *Deleted

## 2019-04-04 NOTE — Telephone Encounter (Signed)
Patient left a message that she had been keeping a 2 week diary. She is bleeding and this has been consistent, getting worse every day.. She states that she is looking forward to the next step.

## 2019-04-04 NOTE — Telephone Encounter (Signed)
Talked with Dr.Rehman - he ask that we arrange for a Flex Sigmoidoscopy under conscious sedation be arranged. Please contact the physician who handles her Eliquis 2.5 mg twice a day, to get approval to hold this medication.  Marland Kitchen

## 2019-04-05 ENCOUNTER — Other Ambulatory Visit (INDEPENDENT_AMBULATORY_CARE_PROVIDER_SITE_OTHER): Payer: Self-pay | Admitting: Internal Medicine

## 2019-04-05 ENCOUNTER — Encounter (INDEPENDENT_AMBULATORY_CARE_PROVIDER_SITE_OTHER): Payer: Self-pay | Admitting: *Deleted

## 2019-04-05 ENCOUNTER — Telehealth (INDEPENDENT_AMBULATORY_CARE_PROVIDER_SITE_OTHER): Payer: Self-pay | Admitting: *Deleted

## 2019-04-05 DIAGNOSIS — K625 Hemorrhage of anus and rectum: Secondary | ICD-10-CM

## 2019-04-05 NOTE — Telephone Encounter (Signed)
Per Barnett Applebaum with [facility], it is ok for patient to stop Eliquis 2 days before FS sch'd 04/14/19, patient aware

## 2019-04-05 NOTE — Telephone Encounter (Signed)
Flex sig sch'd 04/14/19, patient aware, verbal instructions given to patient

## 2019-04-07 NOTE — Progress Notes (Signed)
04/07/19  .The following biosimilar trastuzumab-anns has been selected for use in this patient to begin in September 2020.  Thank you for the opportunity to participate in [name]'s care.  Monique Day, PharmD

## 2019-04-08 ENCOUNTER — Inpatient Hospital Stay (HOSPITAL_COMMUNITY): Payer: Medicare Other

## 2019-04-08 ENCOUNTER — Inpatient Hospital Stay (HOSPITAL_COMMUNITY): Payer: Medicare Other | Attending: Hematology | Admitting: Hematology

## 2019-04-08 ENCOUNTER — Other Ambulatory Visit: Payer: Self-pay

## 2019-04-08 VITALS — BP 135/66 | HR 62 | Temp 96.9°F | Resp 18

## 2019-04-08 DIAGNOSIS — Z86718 Personal history of other venous thrombosis and embolism: Secondary | ICD-10-CM | POA: Insufficient documentation

## 2019-04-08 DIAGNOSIS — Z8042 Family history of malignant neoplasm of prostate: Secondary | ICD-10-CM | POA: Insufficient documentation

## 2019-04-08 DIAGNOSIS — Z87891 Personal history of nicotine dependence: Secondary | ICD-10-CM | POA: Diagnosis not present

## 2019-04-08 DIAGNOSIS — M2550 Pain in unspecified joint: Secondary | ICD-10-CM | POA: Insufficient documentation

## 2019-04-08 DIAGNOSIS — M8580 Other specified disorders of bone density and structure, unspecified site: Secondary | ICD-10-CM | POA: Insufficient documentation

## 2019-04-08 DIAGNOSIS — Z79811 Long term (current) use of aromatase inhibitors: Secondary | ICD-10-CM | POA: Insufficient documentation

## 2019-04-08 DIAGNOSIS — Z170 Estrogen receptor positive status [ER+]: Secondary | ICD-10-CM

## 2019-04-08 DIAGNOSIS — C50212 Malignant neoplasm of upper-inner quadrant of left female breast: Secondary | ICD-10-CM

## 2019-04-08 DIAGNOSIS — K625 Hemorrhage of anus and rectum: Secondary | ICD-10-CM | POA: Insufficient documentation

## 2019-04-08 DIAGNOSIS — Z7901 Long term (current) use of anticoagulants: Secondary | ICD-10-CM | POA: Diagnosis not present

## 2019-04-08 DIAGNOSIS — K5190 Ulcerative colitis, unspecified, without complications: Secondary | ICD-10-CM | POA: Insufficient documentation

## 2019-04-08 DIAGNOSIS — Z8719 Personal history of other diseases of the digestive system: Secondary | ICD-10-CM | POA: Diagnosis not present

## 2019-04-08 DIAGNOSIS — R197 Diarrhea, unspecified: Secondary | ICD-10-CM | POA: Insufficient documentation

## 2019-04-08 DIAGNOSIS — M1990 Unspecified osteoarthritis, unspecified site: Secondary | ICD-10-CM | POA: Insufficient documentation

## 2019-04-08 DIAGNOSIS — Z5112 Encounter for antineoplastic immunotherapy: Secondary | ICD-10-CM | POA: Insufficient documentation

## 2019-04-08 DIAGNOSIS — R232 Flushing: Secondary | ICD-10-CM | POA: Diagnosis not present

## 2019-04-08 DIAGNOSIS — Z79899 Other long term (current) drug therapy: Secondary | ICD-10-CM | POA: Insufficient documentation

## 2019-04-08 DIAGNOSIS — Z9013 Acquired absence of bilateral breasts and nipples: Secondary | ICD-10-CM | POA: Insufficient documentation

## 2019-04-08 DIAGNOSIS — Z882 Allergy status to sulfonamides status: Secondary | ICD-10-CM | POA: Insufficient documentation

## 2019-04-08 LAB — COMPREHENSIVE METABOLIC PANEL
ALT: 16 U/L (ref 0–44)
AST: 18 U/L (ref 15–41)
Albumin: 3.7 g/dL (ref 3.5–5.0)
Alkaline Phosphatase: 34 U/L — ABNORMAL LOW (ref 38–126)
Anion gap: 8 (ref 5–15)
BUN: 18 mg/dL (ref 8–23)
CO2: 26 mmol/L (ref 22–32)
Calcium: 8.5 mg/dL — ABNORMAL LOW (ref 8.9–10.3)
Chloride: 105 mmol/L (ref 98–111)
Creatinine, Ser: 0.92 mg/dL (ref 0.44–1.00)
GFR calc Af Amer: >60 mL/min (ref >60)
GFR calc non Af Amer: 58 mL/min — ABNORMAL LOW (ref >60)
Glucose, Bld: 114 mg/dL — ABNORMAL HIGH (ref 70–99)
Potassium: 3.7 mmol/L (ref 3.5–5.1)
Sodium: 139 mmol/L (ref 135–145)
Total Bilirubin: 0.6 mg/dL (ref 0.3–1.2)
Total Protein: 6.8 g/dL (ref 6.5–8.1)

## 2019-04-08 LAB — CBC WITH DIFFERENTIAL/PLATELET
Abs Immature Granulocytes: 0.02 10*3/uL (ref 0.00–0.07)
Basophils Absolute: 0 10*3/uL (ref 0.0–0.1)
Basophils Relative: 0 %
Eosinophils Absolute: 0.3 10*3/uL (ref 0.0–0.5)
Eosinophils Relative: 5 %
HCT: 39 % (ref 36.0–46.0)
Hemoglobin: 12.6 g/dL (ref 12.0–15.0)
Immature Granulocytes: 0 %
Lymphocytes Relative: 37 %
Lymphs Abs: 2.6 10*3/uL (ref 0.7–4.0)
MCH: 30.6 pg (ref 26.0–34.0)
MCHC: 32.3 g/dL (ref 30.0–36.0)
MCV: 94.7 fL (ref 80.0–100.0)
Monocytes Absolute: 0.7 10*3/uL (ref 0.1–1.0)
Monocytes Relative: 11 %
Neutro Abs: 3.3 10*3/uL (ref 1.7–7.7)
Neutrophils Relative %: 47 %
Platelets: 222 10*3/uL (ref 150–400)
RBC: 4.12 MIL/uL (ref 3.87–5.11)
RDW: 13.2 % (ref 11.5–15.5)
WBC: 7 10*3/uL (ref 4.0–10.5)
nRBC: 0 % (ref 0.0–0.2)

## 2019-04-08 MED ORDER — SODIUM CHLORIDE 0.9% FLUSH
10.0000 mL | INTRAVENOUS | Status: DC | PRN
  Administered 2019-04-08 (×2): 10 mL
  Filled 2019-04-08 (×2): qty 10

## 2019-04-08 MED ORDER — ACETAMINOPHEN 325 MG PO TABS: 650.0000 mg | ORAL_TABLET | Freq: Once | ORAL | Status: DC

## 2019-04-08 MED ORDER — SODIUM CHLORIDE 0.9 % IV SOLN
Freq: Once | INTRAVENOUS | Status: AC
  Administered 2019-04-08: 10:00:00 via INTRAVENOUS

## 2019-04-08 MED ORDER — TRASTUZUMAB CHEMO 150 MG IV SOLR
450.0000 mg | Freq: Once | INTRAVENOUS | Status: AC
  Administered 2019-04-08: 450 mg via INTRAVENOUS
  Filled 2019-04-08: qty 21.43

## 2019-04-08 MED ORDER — DIPHENHYDRAMINE HCL 25 MG PO CAPS: 50.0000 mg | ORAL_CAPSULE | Freq: Once | ORAL | Status: DC

## 2019-04-08 MED ORDER — HEPARIN SOD (PORK) LOCK FLUSH 100 UNIT/ML IV SOLN
500.0000 [IU] | Freq: Once | INTRAVENOUS | Status: AC | PRN
  Administered 2019-04-08: 500 [IU]

## 2019-04-08 NOTE — Progress Notes (Signed)
[place], [place] [address]   CLINIC:  Medical Oncology/Hematology  PCP:  [name], NP [address] [place] [address] [phone]   REASON FOR VISIT:  Follow-up for Breast Cancer   CURRENT THERAPY: Herceptin and Letrozole  BRIEF ONCOLOGIC HISTORY:  Oncology History  Breast cancer of upper-inner quadrant of left female breast ([place])  07/21/2018 Initial Diagnosis   Breast cancer of upper-inner quadrant of left female breast ([place])   10/22/2018 -  Chemotherapy   The patient had trastuzumab (HERCEPTIN) 300 mg in sodium chloride 0.9 % 250 mL chemo infusion, 315 mg, Intravenous,  Once, 7 of 7 cycles Dose modification: 0.06 mg/kg (1 % of original dose 6 mg/kg, Cycle 6, Reason: Other (see comments), Comment: need to resign), 6 mg/kg (original dose 6 mg/kg, Cycle 6, Reason: Other (see comments), Comment: need to reenter to sign ), 2 mg/kg (original dose 6 mg/kg, Cycle 7, Reason: Other (see comments)), 6 mg/kg (original dose 6 mg/kg, Cycle 7, Reason: Other (see comments)) Administration: 300 mg (10/22/2018), 150 mg (10/29/2018), 150 mg (11/19/2018), 450 mg (02/03/2019), 450 mg (02/24/2019), 150 mg (11/05/2018), 150 mg (11/12/2018), 150 mg (12/01/2018), 150 mg (12/10/2018), 150 mg (12/17/2018), 150 mg (12/24/2018), 150 mg (12/31/2018), 150 mg (01/07/2019), 150 mg (01/14/2019), 450 mg (03/18/2019) PACLitaxel (TAXOL) 72 mg in sodium chloride 0.9 % 150 mL chemo infusion (</= 34m/m2), 40 mg/m2 = 72 mg (50 % of original dose 80 mg/m2), Intravenous,  Once, 3 of 3 cycles Dose modification: 40 mg/m2 (50 % of original dose 80 mg/m2, Cycle 1, Reason: Provider Judgment) Administration: 72 mg (10/22/2018), 72 mg (10/29/2018), 72 mg (11/19/2018), 72 mg (11/05/2018), 72 mg (11/12/2018), 72 mg (12/01/2018), 72 mg (12/10/2018), 72 mg (12/17/2018), 72 mg (12/24/2018), 72 mg (12/31/2018), 72 mg (01/07/2019), 72 mg (01/14/2019) trastuzumab-anns (KANJINTI) 462 mg in sodium chloride 0.9 % 250 mL  chemo infusion, 6 mg/kg = 462 mg (original dose ), Intravenous,  Once, 0 of 9 cycles Dose modification: 6 mg/kg (Cycle 8, Reason: Provider Judgment, Comment: change to biosimilar)  for chemotherapy treatment.         INTERVAL HISTORY:  Ms. [id number]. female presents today for follow up. Reports overall doing well. Denies any significant fatigue. She is currently on Letrozole, tolerating well. She does report tolerable hot flashes. Denies any changes in her breast. No new lumps, masses, nipple inversion or discharge. Reports bright red blood per rectum, which she attributes to her hx of ulcerative colitis. She is scheduled to see gastro next week for colonoscopy.  She states she is ready to proceed with treatment today.    REVIEW OF SYSTEMS:  Review of Systems  Constitutional: Negative.   HENT:  Negative.   Eyes: Negative.   Respiratory: Negative.   Cardiovascular: Negative.   Gastrointestinal: Positive for blood in stool and diarrhea.  Endocrine: Negative.   Genitourinary: Negative.    Musculoskeletal: Positive for arthralgias.  Skin: Negative.   Neurological: Negative.   Hematological: Negative.   Psychiatric/Behavioral: Negative.      PAST MEDICAL/SURGICAL HISTORY:  Past Medical History:  Diagnosis Date  . Adult idiopathic generalized osteoporosis   . Arthritis    "minor" (08/18/2018)  . Breast cancer, left breast ([facility] [place]   lumpectomy  . Breast cancer, right breast ([facility] 2009   lumpectomy; mastectomy  . DVT (deep venous thrombosis) ([facility] "early 2000's"   RLE  . D[place]deep venous [place]) ([facility]   . Hemorrhoids, external   .  History of blood transfusion    "related to ulcerative colitis"  . Lower abdominal pain   . Occult blood in stools   . Peripheral edema   . Seizures ([place])    one very slight seizure with the stroke; somewhere between 2005-2009  . Stroke [facility]) 2005-2009   "very light;" ; denies residual on 08/19/2018  . UC (ulcerative colitis) ([place])  dx'd [phone]   Past Surgical History:  Procedure Laterality Date  . APPENDECTOMY    . bone density  12/11/10  . BREAST BIOPSY Left 1998; 2019 X 2  . BREAST BIOPSY Right 2009  . BREAST LUMPECTOMY Left 1998  . CATARACT EXTRACTION W/ INTRAOCULAR LENS  IMPLANT, BILATERAL Bilateral   . COLONOSCOPY  02/21/2011  . COLONOSCOPY  10/06/2008  . COLONOSCOPY  12/27/01  . COLONOSCOPY  05/09/05  . COLONOSCOPY N/A 07/02/2016   Procedure: COLONOSCOPY;  Surgeon: [name], MD;  Location: [facility];  Service: Endoscopy;  Laterality: N/A;  1055  . [place]   "I was having rectal bleeding"  . FRACTURE SURGERY    . MASTECTOMY Right 2009  . MASTECTOMY COMPLETE / SIMPLE Left 08/19/2018  . PORTACATH PLACEMENT Right 10/15/2018   Procedure: INSERTION PORT-A-CATH RIGHT SUBCLAVIN;  Surgeon: [name], MD;  Location: [facility] ORS;  Service: General;  Laterality: Right;  pt knows to arrive at 7:30  . SIMPLE MASTECTOMY WITH AXILLARY SENTINEL NODE BIOPSY Left 08/19/2018   Procedure: LEFT SIMPLE MASTECTOMY;  Surgeon: [name], MD;  Location: [facility];  Service: General;  Laterality: Left;  . TONSILLECTOMY    . VENA CAVA FILTER PLACEMENT Right   . WRIST FRACTURE SURGERY Right      SOCIAL HISTORY:  Social History   Socioeconomic History  . Marital status: Widowed    Spouse name: Not on file  . Number of children: 0  . Years of education: Not on file  . Highest education level: Not on file  Occupational History  . Occupation: Optometrist: [employer]  Social Needs  . Financial resource strain: Not hard at all  . Food insecurity    Worry: Never true    Inability: Never true  . Transportation needs    Medical: No    Non-medical: No  Tobacco Use  . Smoking status: Former Smoker    Packs/day: 2.00    Years: 40.00    Pack years: 80.00    Types: Cigarettes    Quit date: 05/05/2001    Years since quitting: 17.9  . Smokeless tobacco: Never Used   Substance and Sexual Activity  . Alcohol use: Yes    Comment: 08/18/2018 "couple drinks/year"  . Drug use: Never  . Sexual activity: Not Currently  Lifestyle  . Physical activity    Days per week: 0 days    Minutes per session: 0 min  . Stress: Not at all  Relationships  . Social connections    Talks on phone: More than three times a week    Gets together: More than three times a week    Attends religious service: More than 4 times per year    Active member of club or organization: Yes    Attends meetings of clubs or organizations: More than 4 times per year    Relationship status: Widowed  . Intimate partner violence    Fear of current or ex partner: No    Emotionally abused: No    Physically abused: No  Forced sexual activity: No  Other Topics Concern  . Not on file  Social History Narrative  . Not on file    FAMILY HISTORY:  Family History  Problem Relation Age of Onset  . Prostate cancer Father   . Colon cancer Neg Hx     CURRENT MEDICATIONS:  Outpatient Encounter Medications as of 04/08/2019  Medication Sig Note  . acetaminophen (TYLENOL) 500 MG tablet Take 500 mg by mouth every 6 (six) hours as needed for moderate pain or headache.    . Ascorbic Acid (VITAMIN C) 1000 MG tablet Take 1,000 mg by mouth daily.   . Calcium Carbonate-Vitamin D (CALCIUM 600+D) 600-400 MG-UNIT tablet Take 1 tablet by mouth daily.   . cholecalciferol (VITAMIN D3) 25 MCG (1000 UT) tablet Take 1,000 Units by mouth daily.   Marland Kitchen denosumab (PROLIA) 60 MG/ML SOLN injection Inject 60 mg into the skin every 6 (six) months. Administer in upper arm, thigh, or abdomen 10/07/2018: LAST DOSE:09/2018  . ELIQUIS 2.5 MG TABS tablet Take 2.5 mg by mouth 2 (two) times daily.    Marland Kitchen HUMIRA PEN 40 MG/0.8ML PNKT INJECT 40 MG SUBQ EVERY 14 DAYS. (Patient taking differently: Inject 40 mg as directed every 14 (fourteen) days. SATURDAYS.)   . hydrocortisone (ANUSOL-HC) 2.5 % rectal cream Place 1 application rectally at  bedtime. (Patient not taking: Reported on 04/07/2019)   . letrozole (FEMARA) 2.5 MG tablet Take 1 tablet (2.5 mg total) by mouth daily.   Marland Kitchen lidocaine-prilocaine (EMLA) cream Apply to port site 1 hour prior to appointment   . Magnesium 500 MG TABS Take 500 mg by mouth daily.   . Naphazoline-Pheniramine (OPCON-A) 0.027-0.315 % SOLN Place 1 drop into both eyes 3 (three) times daily as needed (dry eyes).  09/21/2018: As needed , rare per patient  . polyethylene glycol powder (GLYCOLAX/MIRALAX) powder Take 8.5 g by mouth daily as needed. (Patient taking differently: Take 8.5 g by mouth daily as needed (constipation.). )   . PRESCRIPTION MEDICATION Apply 1 application topically 2 (two) times daily as needed (eczema). Ketoconazole-fluticasone 1:1 compounded cream  08/03/2018: Compounded at [facility]  . prochlorperazine (COMPAZINE) 10 MG tablet Take 1 tablet (10 mg total) by mouth every 6 (six) hours as needed (Nausea or vomiting). (Patient not taking: Reported on 03/18/2019) 10/07/2018: NEW MEDICATION (PATIENT HAS NOT CHEMOTHERAPY TO DATE)   . Trastuzumab (HERCEPTIN IV) Inject into the vein every 21 ( twenty-one) days. Every 3 weeks x 1 year starting 10/22/2018    Facility-Administered Encounter Medications as of 04/08/2019  Medication  . [COMPLETED] 0.9 %  sodium chloride infusion  . acetaminophen (TYLENOL) tablet 650 mg  . diphenhydrAMINE (BENADRYL) capsule 50 mg  . heparin lock flush 100 unit/mL  . sodium chloride flush (NS) 0.9 % injection 10 mL  . trastuzumab (HERCEPTIN) 450 mg in sodium chloride 0.9 % 250 mL chemo infusion    ALLERGIES:  Allergies  Allergen Reactions  . Mercaptopurine Other (See Comments)    Dropped WBC  . Sulfa Antibiotics Other (See Comments)    Extreme Weakness     PHYSICAL EXAM:  ECOG Performance status: 2  There were no vitals filed for this visit. There were no vitals filed for this visit.  Physical Exam Constitutional:      Appearance: Normal appearance. She is  obese.  HENT:     Head: Normocephalic.     Right Ear: External ear normal.     Left Ear: External ear normal.     Nose: Nose  normal.     Mouth/Throat:     Mouth: Mucous membranes are moist.     Pharynx: Oropharynx is clear.  Eyes:     Extraocular Movements: Extraocular movements intact.     Conjunctiva/sclera: Conjunctivae normal.  Neck:     Musculoskeletal: Normal range of motion.  Cardiovascular:     Rate and Rhythm: Normal rate and regular rhythm.     Pulses: Normal pulses.     Heart sounds: Normal heart sounds.  Pulmonary:     Effort: Pulmonary effort is normal.     Breath sounds: Normal breath sounds.  Abdominal:     General: Bowel sounds are normal.  Musculoskeletal: Normal range of motion.  Skin:    General: Skin is warm.  Neurological:     General: No focal deficit present.     Mental Status: She is alert and oriented to person, place, and time.  Psychiatric:        Mood and Affect: Mood normal.        Behavior: Behavior normal.        Thought Content: Thought content normal.        Judgment: Judgment normal.      LABORATORY DATA:  I have reviewed the labs as listed.  CBC    Component Value Date/Time   WBC 7.0 04/08/2019 0801   RBC 4.12 04/08/2019 0801   HGB 12.6 04/08/2019 0801   HGB 14.4 03/10/2017 1254   HCT 39.0 04/08/2019 0801   HCT 41.5 03/10/2017 1254   PLT 222 04/08/2019 0801   PLT 249 03/10/2017 1254   MCV 94.7 04/08/2019 0801   MCV 91 03/10/2017 1254   MCH 30.6 04/08/2019 0801   MCHC 32.3 04/08/2019 0801   RDW 13.2 04/08/2019 0801   RDW 13.3 03/10/2017 1254   LYMPHSABS 2.6 04/08/2019 0801   LYMPHSABS 2.7 03/10/2017 1254   MONOABS 0.7 04/08/2019 0801   EOSABS 0.3 04/08/2019 0801   EOSABS 0.4 03/10/2017 1254   BASOSABS 0.0 04/08/2019 0801   BASOSABS 0.1 03/10/2017 1254   CMP Latest Ref Rng & Units 04/08/2019 02/24/2019 02/03/2019  Glucose 70 - 99 mg/dL 114(H) 100(H) 111(H)  BUN 8 - 23 mg/dL 18 21 16   Creatinine 0.44 - 1.00 mg/dL 0.92  1.05(H) 0.94  Sodium 135 - 145 mmol/L 139 139 140  Potassium 3.5 - 5.1 mmol/L 3.7 4.0 3.9  Chloride 98 - 111 mmol/L 105 104 106  CO2 22 - 32 mmol/L 26 26 22   Calcium 8.9 - 10.3 mg/dL 8.5(L) 8.9 8.5(L)  Total Protein 6.5 - 8.1 g/dL 6.8 7.0 6.5  Total Bilirubin 0.3 - 1.2 mg/dL 0.6 0.8 0.6  Alkaline Phos 38 - 126 U/L 34(L) 30(L) 30(L)  AST 15 - 41 U/L 18 20 18   ALT 0 - 44 U/L 16 17 19        ASSESSMENT & PLAN:   Breast cancer of upper-inner quadrant of left female breast (H[facility]) 1.  Stage I (PT1CNX) HER-2 positive left breast cancer: -Left mastectomy on 08/19/2018, 1.1 cm IDC, grade 1, margins negative.  No lymph nodes were identified.  ER was 100% positive, PR 7% positive and HER-2 3+. - 12 doses of weekly paclitaxel and Herceptin from 10/22/2018 through 01/14/2019. - Last echocardiogram on 01/25/2019 shows EF of 60 to 65%.  Some degree of left ear with left ventricular wall thickness. - Started on Herceptin every 3 weeks on 02/03/2019. - She has tried anastrozole in December 2019 made her confused and forgetful. -She is started  on Femara 2.5 mg daily on 02/03/2019.  She has tolerated Femara so far very well.  She did not have any confusion episodes. - Labs are acceptable.  She may proceed with Herceptin today. -She will return to clinic in 3 weeks for follow-up. She will need a repeat ECHO in September.   2.  Osteopenia: -DEXA scan on 02/08/2018 shows T score of -1.2. -She is receiving Prolia every 6 months with Dr. Melina Copa. -She was counseled to continue calcium and vitamin D supplements.  3.  Recurrent DVTs: -She has multiple episodes of DVTs and is on Eliquis.  She is tolerating it very well.  4.  Ulcerative colitis: -She is on mesalamine and Humira. -She has noticed some rectal bleeding in the last week.  She is scheduled to see Dr. Olevia Perches, gastroenterologist, next week.       Orders placed this encounter:  Orders Placed This Encounter  Procedures  . CBC with Differential  .  Comprehensive metabolic panel      Roger Shelter, [place] [phone]

## 2019-04-08 NOTE — Assessment & Plan Note (Addendum)
1.  Stage I (PT1CNX) HER-2 positive left breast cancer: -Left mastectomy on 08/19/2018, 1.1 cm IDC, grade 1, margins negative.  No lymph nodes were identified.  ER was 100% positive, PR 7% positive and HER-2 3+. - 12 doses of weekly paclitaxel and Herceptin from 10/22/2018 through 01/14/2019. - Last echocardiogram on 01/25/2019 shows EF of 60 to 65%.  Some degree of left ear with left ventricular wall thickness. - Started on Herceptin every 3 weeks on 02/03/2019. - She has tried anastrozole in December 2019 made her confused and forgetful. -She is started on Femara 2.5 mg daily on 02/03/2019.  She has tolerated Femara so far very well.  She did not have any confusion episodes. - Labs are acceptable.  She may proceed with Herceptin today. -She will return to clinic in 3 weeks for follow-up. She will need a repeat ECHO in September.   2.  Osteopenia: -DEXA scan on 02/08/2018 shows T score of -1.2. -She is receiving Prolia every 6 months with Dr. Melina Copa. -She was counseled to continue calcium and vitamin D supplements.  3.  Recurrent DVTs: -She has multiple episodes of DVTs and is on Eliquis.  She is tolerating it very well.  4.  Ulcerative colitis: -She is on mesalamine and Humira. -She has noticed some rectal bleeding in the last week.  She is scheduled to see Dr. Olevia Perches, gastroenterologist, next week.

## 2019-04-08 NOTE — Progress Notes (Signed)
Patient seen by Reynolds Bowl, NP, with verbal order ok to treat today. Labs reviewed.    Patient tolerated chemotherapy with no complaints voiced.  Port site clean and dry with no bruising or swelling noted at site.  Good blood return noted before and after administration of chemotherapy.  Band aid applied.  Patient left ambulatory with VSS and no s/s of distress noted.

## 2019-04-12 ENCOUNTER — Other Ambulatory Visit (HOSPITAL_COMMUNITY)
Admission: RE | Admit: 2019-04-12 | Discharge: 2019-04-12 | Disposition: A | Discharge status: Home / Self Care | Payer: Medicare Other | Source: Ambulatory Visit | Attending: Internal Medicine | Admitting: Internal Medicine

## 2019-04-12 ENCOUNTER — Other Ambulatory Visit: Payer: Self-pay

## 2019-04-12 DIAGNOSIS — Z01812 Encounter for preprocedural laboratory examination: Secondary | ICD-10-CM | POA: Diagnosis present

## 2019-04-12 DIAGNOSIS — Z20828 Contact with and (suspected) exposure to other viral communicable diseases: Secondary | ICD-10-CM | POA: Insufficient documentation

## 2019-04-12 LAB — SARS CORONAVIRUS 2 (TAT 6-24 HRS): SARS Coronavirus 2: NEGATIVE

## 2019-04-14 ENCOUNTER — Encounter (HOSPITAL_COMMUNITY)
Admission: RE | Disposition: A | Discharge status: Home / Self Care | Payer: Self-pay | Source: Home / Self Care | Attending: Internal Medicine

## 2019-04-14 ENCOUNTER — Ambulatory Visit (HOSPITAL_COMMUNITY)
Admission: RE | Admit: 2019-04-14 | Discharge: 2019-04-14 | Disposition: A | Discharge status: Home / Self Care | Payer: Medicare Other | Attending: Internal Medicine | Admitting: Internal Medicine

## 2019-04-14 ENCOUNTER — Other Ambulatory Visit: Payer: Self-pay

## 2019-04-14 ENCOUNTER — Other Ambulatory Visit (HOSPITAL_COMMUNITY)
Admission: RE | Admit: 2019-04-14 | Discharge: 2019-04-14 | Disposition: A | Discharge status: Home / Self Care | Payer: Medicare Other | Source: Ambulatory Visit | Attending: Internal Medicine | Admitting: Internal Medicine

## 2019-04-14 DIAGNOSIS — K51211 Ulcerative (chronic) proctitis with rectal bleeding: Secondary | ICD-10-CM

## 2019-04-14 DIAGNOSIS — Z79811 Long term (current) use of aromatase inhibitors: Secondary | ICD-10-CM | POA: Insufficient documentation

## 2019-04-14 DIAGNOSIS — C50911 Malignant neoplasm of unspecified site of right female breast: Secondary | ICD-10-CM | POA: Insufficient documentation

## 2019-04-14 DIAGNOSIS — Z7901 Long term (current) use of anticoagulants: Secondary | ICD-10-CM | POA: Insufficient documentation

## 2019-04-14 DIAGNOSIS — K5190 Ulcerative colitis, unspecified, without complications: Secondary | ICD-10-CM | POA: Diagnosis not present

## 2019-04-14 DIAGNOSIS — K625 Hemorrhage of anus and rectum: Secondary | ICD-10-CM | POA: Insufficient documentation

## 2019-04-14 DIAGNOSIS — K51218 Ulcerative (chronic) proctitis with other complication: Secondary | ICD-10-CM | POA: Diagnosis not present

## 2019-04-14 DIAGNOSIS — Z8673 Personal history of transient ischemic attack (TIA), and cerebral infarction without residual deficits: Secondary | ICD-10-CM | POA: Diagnosis not present

## 2019-04-14 DIAGNOSIS — K921 Melena: Secondary | ICD-10-CM | POA: Diagnosis present

## 2019-04-14 DIAGNOSIS — Z87891 Personal history of nicotine dependence: Secondary | ICD-10-CM | POA: Insufficient documentation

## 2019-04-14 DIAGNOSIS — Z79899 Other long term (current) drug therapy: Secondary | ICD-10-CM | POA: Diagnosis not present

## 2019-04-14 DIAGNOSIS — Z86718 Personal history of other venous thrombosis and embolism: Secondary | ICD-10-CM | POA: Insufficient documentation

## 2019-04-14 HISTORY — PX: BIOPSY: SHX5522

## 2019-04-14 HISTORY — PX: FLEXIBLE SIGMOIDOSCOPY: SHX5431

## 2019-04-14 SURGERY — SIGMOIDOSCOPY, FLEXIBLE
Anesthesia: Moderate Sedation

## 2019-04-14 MED ORDER — MIDAZOLAM HCL 5 MG/5ML IJ SOLN
INTRAMUSCULAR | Status: AC
  Filled 2019-04-14: qty 10

## 2019-04-14 MED ORDER — MIDAZOLAM HCL 5 MG/5ML IJ SOLN
INTRAMUSCULAR | Status: DC | PRN
  Administered 2019-04-14 (×2): 1 mg via INTRAVENOUS
  Administered 2019-04-14: 2 mg via INTRAVENOUS

## 2019-04-14 MED ORDER — MEPERIDINE HCL 25 MG/ML IJ SOLN
INTRAMUSCULAR | Status: DC | PRN
  Administered 2019-04-14 (×2): 25 mg via INTRAVENOUS

## 2019-04-14 MED ORDER — SODIUM CHLORIDE 0.9 % IV SOLN
INTRAVENOUS | Status: DC
  Administered 2019-04-14: 10:00:00 via INTRAVENOUS

## 2019-04-14 MED ORDER — MEPERIDINE HCL 50 MG/ML IJ SOLN
INTRAMUSCULAR | Status: AC
  Filled 2019-04-14: qty 1

## 2019-04-14 NOTE — Op Note (Signed)
[facility] Patient Name: Monique Day Procedure Date: 04/14/2019 10:45 AM MRN: [id number] Date of Birth: Oct 25, 1935 Attending MD: Hildred Laser , MD CSN: [id number] Age: 83 Admit Type: Outpatient Procedure:                Flexible Sigmoidoscopy Indications:              Hematochezia, Ulcerative colitis Providers:                Hildred Laser, MD, Janeece Riggers, RN, Raphael Gibney,                            Technician Referring MD:             Hessie Diener. Georgina Quint, MD Medicines:                Meperidine 50 mg IV, Midazolam 4 mg IV Complications:            No immediate complications. Estimated Blood Loss:     Estimated blood loss was minimal. Procedure:                Pre-Anesthesia Assessment:                           - Prior to the procedure, a History and Physical                            was performed, and patient medications and                            allergies were reviewed. The patient's tolerance of                            previous anesthesia was also reviewed. The risks                            and benefits of the procedure and the sedation                            options and risks were discussed with the patient.                            All questions were answered, and informed consent                            was obtained. Prior Anticoagulants: The patient                            last took Eliquis (apixaban) 3 days prior to the                            procedure. ASA Grade Assessment: III - A patient                            with severe systemic disease. After reviewing the  risks and benefits, the patient was deemed in                            satisfactory condition to undergo the procedure.                           After obtaining informed consent, the scope was                            passed under direct vision. The PCF-[id number]                            ([id number]) was introduced through the anus and                     advanced to the the sigmoid colon. The flexible                            sigmoidoscopy was accomplished without difficulty.                            The patient tolerated the procedure well. The                            quality of the bowel preparation was adequate. Scope In: 11:11:33 AM Scope Out: 11:21:13 AM Total Procedure Duration: 0 hours 9 minutes 40 seconds  Findings:      The perianal and digital rectal examinations were normal.      The mid sigmoid colon and distal sigmoid colon appeared normal. Biopsies       were taken with a cold forceps for histology. The pathology specimen was       placed into Bottle Number 1.      Inflammation characterized by adherent blood, altered vascularity,       congestion (edema), erosions, friability and linear erosions was found.       The rectum was spared. This was moderate in severity, and when compared       to previous examinations, the findings are worsened. Biopsies were taken       with a cold forceps for histology. The pathology specimen was placed       into Bottle Number 1. Impression:               - The mid sigmoid colon and distal sigmoid colon                            are normal. Biopsied.                           - Ulcerative colitis. Inflammation was found. This                            was moderate in severity, worsened compared to                            previous examinations. Biopsied. Moderate Sedation:      Moderate (conscious) sedation was administered by the endoscopy  nurse       and supervised by the endoscopist. The following parameters were       monitored: oxygen saturation, heart rate, blood pressure, CO2       capnography and response to care. Total physician intraservice time was       16 minutes. Recommendation:           - Discharge patient to home (with escort).                           - Resume previous diet today.                           - Continue present medications.                            - Resume Eliquis (apixaban) at prior dose tomorrow.                           - Await pathology results.                           - Check Adalimumab drug and antibody levels. Procedure Code(s):        --- Professional ---                           [id number], Sigmoidoscopy, flexible; with biopsy, single                            or multiple                           G0500, Moderate sedation services provided by the                            same physician or other qualified health care                            professional performing a gastrointestinal                            endoscopic service that sedation supports,                            requiring the presence of an independent trained                            observer to assist in the monitoring of the                            patient's level of consciousness and physiological                            status; initial 15 minutes of intra-service time;                            patient age 69  years or older (additional time may                            be reported with [phone], as appropriate) Diagnosis Code(s):        --- Professional ---                           K51.90, Ulcerative colitis, unspecified, without                            complications                           K92.1, Melena (includes Hematochezia) CPT copyright 2019 American Medical Association. All rights reserved. The codes documented in this report are preliminary and upon coder review may  be revised to meet current compliance requirements. Hildred Laser, MD Hildred Laser, MD 04/14/2019 11:32:51 AM This report has been signed electronically. Number of Addenda: 0

## 2019-04-14 NOTE — Discharge Instructions (Signed)
Resume Eliquis tomorrow evening at usual dose. Resume other medications as before. Resume usual diet. No driving for 24 hours. Patient will call with results of biopsy and blood test and further recommendations.   Flexible Sigmoidoscopy, Care After This sheet gives you information about how to care for yourself after your procedure. Your health care provider may also give you more specific instructions. If you have problems or questions, contact your health care provider.   Dr Laural Golden:  [phone] What can I expect after the procedure? After the procedure, it is common to have:  Abdominal cramping or pain.  Bloating.  A small amount of rectal bleeding if you had a biopsy. Follow these instructions at home:  Take over-the-counter and prescription medicines only as told by your health care provider.  Do not drive for 24 hours if you received a medicine to help you relax (sedative).  Keep all follow-up visits as told by your health care provider. This is important. Contact a health care provider if:  You have abdominal pain or cramping that gets worse or is not helped with medicine.  You continue to have small amounts of rectal bleeding after 24 hours.  You have nausea or vomiting.  You feel weak or dizzy.  You have a fever. Get help right away if:  You pass large blood clots or see a large amount of blood in the toilet after having a bowel movement.  You have nausea or vomiting for more than 24 hours after the procedure. This information is not intended to replace advice given to you by your health care provider. Make sure you discuss any questions you have with your health care provider. Document Released: 08/09/2013 Document Revised: 03/27/2016 Document Reviewed: 11/03/2015 Elsevier Patient Education  2020 [employer].

## 2019-04-14 NOTE — H&P (Addendum)
Monique Day is an 83 y.o. female.   Chief Complaint: Patient is here for flexible sigmoidoscopy. HPI: Patient is 82 year old Caucasian female who has a history of ulcerative colitis whose last colonoscopy was in November 2017 when she was in clinical and endoscopic remission.  He has been maintained on Adaluzimab and oral mesalamine.  She now presents with rectal bleeding and she has noted change in consistency of her stools.  She has noted fleeting pain under the left rib cage.  Eliquis is on hold.  Last dose was the evening of 04/11/2019.  Past Medical History:  Diagnosis Date  . Adult idiopathic generalized osteoporosis   . Arthritis    "minor" (08/18/2018)  . Breast cancer, left breast ([place]) 1998   lumpectomy  . Breast cancer, right breast ([place]) 2009   lumpectomy; mastectomy  . DVT (deep venous thrombosis) ([place]) "early 2000's"   RLE  . DVT (deep venous thrombosis) ([place])   . Hemorrhoids, external   . History of blood transfusion    "related to ulcerative colitis"  . Lower abdominal pain   . Occult blood in stools   . Peripheral edema   . Seizures ([place])    one very slight seizure with the stroke; somewhere between 2005-2009  . Stroke [facility]) 2005-2009   "very light;" ; denies residual on 08/19/2018  . UC (ulcerative colitis) ([place]) dx'd 4008    Past Surgical History:  Procedure Laterality Date  . APPENDECTOMY    . bone density  12/11/10  . BREAST BIOPSY Left 1998; 2019 X 2  . BREAST BIOPSY Right 2009  . BREAST LUMPECTOMY Left 1998  . CATARACT EXTRACTION W/ INTRAOCULAR LENS  IMPLANT, BILATERAL Bilateral   . COLONOSCOPY  02/21/2011  . COLONOSCOPY  10/06/2008  . COLONOSCOPY  12/27/01  . COLONOSCOPY  05/09/05  . COLONOSCOPY N/A 07/02/2016   Procedure: COLONOSCOPY;  Surgeon: [name], MD;  Location: [facility];  Service: Endoscopy;  Laterality: N/A;  1055  . No Name   "I was having rectal bleeding"  . FRACTURE SURGERY    . MASTECTOMY Right 2009   . MASTECTOMY COMPLETE / SIMPLE Left 08/19/2018  . PORTACATH PLACEMENT Right 10/15/2018   Procedure: INSERTION PORT-A-CATH RIGHT SUBCLAVIN;  Surgeon: [name], MD;  Location: [facility] ORS;  Service: General;  Laterality: Right;  pt knows to arrive at 7:30  . SIMPLE MASTECTOMY WITH AXILLARY SENTINEL NODE BIOPSY Left 08/19/2018   Procedure: LEFT SIMPLE MASTECTOMY;  Surgeon: [name], MD;  Location: [place];  Service: General;  Laterality: Left;  . TONSILLECTOMY    . VENA CAVA FILTER PLACEMENT Right   . WRIST FRACTURE SURGERY Right     Family History  Problem Relation Age of Onset  . Prostate cancer Father   . Colon cancer Neg Hx    Social History:  reports that she quit smoking about 17 years ago. Her smoking use included cigarettes. She has a 80.00 pack-year smoking history. She has never used smokeless tobacco. She reports current alcohol use. She reports that she does not use drugs.  Allergies:  Allergies  Allergen Reactions  . Mercaptopurine Other (See Comments)    Dropped WBC  . Sulfa Antibiotics Other (See Comments)    Extreme Weakness    Medications Prior to Admission  Medication Sig Dispense Refill  . acetaminophen (TYLENOL) 500 MG tablet Take 500 mg by mouth every 6 (six) hours as needed for moderate pain or headache.     . Ascorbic Acid (  VITAMIN C) 1000 MG tablet Take 1,000 mg by mouth daily.    . Calcium Carbonate-Vitamin D (CALCIUM 600+D) 600-400 MG-UNIT tablet Take 1 tablet by mouth daily.    . cholecalciferol (VITAMIN D3) 25 MCG (1000 UT) tablet Take 1,000 Units by mouth daily.    Marland Kitchen denosumab (PROLIA) 60 MG/ML SOLN injection Inject 60 mg into the skin every 6 (six) months. Administer in upper arm, thigh, or abdomen    . ELIQUIS 2.5 MG TABS tablet Take 2.5 mg by mouth 2 (two) times daily.     Marland Kitchen HUMIRA PEN 40 MG/0.8ML PNKT INJECT 40 MG SUBQ EVERY 14 DAYS. (Patient taking differently: Inject 40 mg as directed every 14 (fourteen) days. SATURDAYS.) 2 each 11  . letrozole  (FEMARA) 2.5 MG tablet Take 1 tablet (2.5 mg total) by mouth daily. 30 tablet 6  . lidocaine-prilocaine (EMLA) cream Apply to port site 1 hour prior to appointment 30 g 3  . Magnesium 500 MG TABS Take 500 mg by mouth daily.    . Naphazoline-Pheniramine (OPCON-A) 0.027-0.315 % SOLN Place 1 drop into both eyes 3 (three) times daily as needed (dry eyes).     . polyethylene glycol powder (GLYCOLAX/MIRALAX) powder Take 8.5 g by mouth daily as needed. (Patient taking differently: Take 8.5 g by mouth daily as needed (constipation.). ) 255 g 0  . PRESCRIPTION MEDICATION Apply 1 application topically 2 (two) times daily as needed (eczema). Ketoconazole-fluticasone 1:1 compounded cream     . Trastuzumab (HERCEPTIN IV) Inject into the vein every 21 ( twenty-one) days. Every 3 weeks x 1 year starting 10/22/2018    . hydrocortisone (ANUSOL-HC) 2.5 % rectal cream Place 1 application rectally at bedtime. (Patient not taking: Reported on 04/07/2019) 30 g 1  . prochlorperazine (COMPAZINE) 10 MG tablet Take 1 tablet (10 mg total) by mouth every 6 (six) hours as needed (Nausea or vomiting). (Patient not taking: Reported on 03/18/2019) 30 tablet 1    No results found for this or any previous visit (from the past 48 hour(s)). No results found.  ROS  Blood pressure (!) 175/97, pulse 87, temperature 98.1 F (36.7 C), temperature source Oral, resp. rate 16, SpO2 96 %. Physical Exam  Constitutional: She appears well-developed and well-nourished.  HENT:  Mouth/Throat: Oropharynx is clear and moist.  Eyes: Conjunctivae are normal. No scleral icterus.  Neck: No thyromegaly present.  Cardiovascular: Normal rate, regular rhythm and normal heart sounds.  No murmur heard. Respiratory: Effort normal and breath sounds normal.  GI:  Abdomen is symmetrical.  She has left lower paramedian scar.  Abdomen is soft.  Mild hypogastric tenderness noted.  No organomegaly or masses.  Musculoskeletal:        General: No edema.   Lymphadenopathy:    She has no cervical adenopathy.  Neurological: She is alert.  Skin: Skin is warm and dry.     Assessment/Plan Rectal bleeding in a patient with history of ulcerative colitis requiring anticoagulant. Diagnostic flexible sigmoidoscopy.  Hildred Laser, MD 04/14/2019, 10:58 AM

## 2019-04-17 ENCOUNTER — Other Ambulatory Visit (INDEPENDENT_AMBULATORY_CARE_PROVIDER_SITE_OTHER): Payer: Self-pay | Admitting: Internal Medicine

## 2019-04-17 MED ORDER — HYDROCORTISONE 100 MG/60ML RE ENEM: 1.0000 | ENEMA | Freq: Every day | RECTAL | 0 refills | Status: DC

## 2019-04-18 ENCOUNTER — Encounter (HOSPITAL_COMMUNITY): Payer: Self-pay | Admitting: Internal Medicine

## 2019-04-18 LAB — MISC LABCORP TEST (SEND OUT): Labcorp test code: 504575

## 2019-04-29 ENCOUNTER — Encounter (HOSPITAL_COMMUNITY): Payer: Self-pay | Admitting: Hematology

## 2019-04-29 ENCOUNTER — Inpatient Hospital Stay (HOSPITAL_COMMUNITY): Payer: Medicare Other | Attending: Hematology

## 2019-04-29 ENCOUNTER — Other Ambulatory Visit: Payer: Self-pay

## 2019-04-29 ENCOUNTER — Inpatient Hospital Stay (HOSPITAL_COMMUNITY): Payer: Medicare Other

## 2019-04-29 ENCOUNTER — Inpatient Hospital Stay (HOSPITAL_COMMUNITY): Payer: Medicare Other | Admitting: Hematology

## 2019-04-29 VITALS — BP 143/62 | HR 71 | Temp 97.5°F | Resp 16 | Wt 178.2 lb

## 2019-04-29 VITALS — BP 145/65 | HR 62 | Temp 97.5°F | Resp 18

## 2019-04-29 DIAGNOSIS — Z170 Estrogen receptor positive status [ER+]: Secondary | ICD-10-CM

## 2019-04-29 DIAGNOSIS — Z8042 Family history of malignant neoplasm of prostate: Secondary | ICD-10-CM | POA: Diagnosis not present

## 2019-04-29 DIAGNOSIS — Z882 Allergy status to sulfonamides status: Secondary | ICD-10-CM | POA: Diagnosis not present

## 2019-04-29 DIAGNOSIS — Z853 Personal history of malignant neoplasm of breast: Secondary | ICD-10-CM | POA: Diagnosis not present

## 2019-04-29 DIAGNOSIS — Z7901 Long term (current) use of anticoagulants: Secondary | ICD-10-CM | POA: Insufficient documentation

## 2019-04-29 DIAGNOSIS — R197 Diarrhea, unspecified: Secondary | ICD-10-CM | POA: Insufficient documentation

## 2019-04-29 DIAGNOSIS — Z5112 Encounter for antineoplastic immunotherapy: Secondary | ICD-10-CM | POA: Insufficient documentation

## 2019-04-29 DIAGNOSIS — C50212 Malignant neoplasm of upper-inner quadrant of left female breast: Secondary | ICD-10-CM | POA: Diagnosis present

## 2019-04-29 DIAGNOSIS — K5190 Ulcerative colitis, unspecified, without complications: Secondary | ICD-10-CM | POA: Diagnosis not present

## 2019-04-29 DIAGNOSIS — Z79899 Other long term (current) drug therapy: Secondary | ICD-10-CM | POA: Insufficient documentation

## 2019-04-29 DIAGNOSIS — Z79811 Long term (current) use of aromatase inhibitors: Secondary | ICD-10-CM | POA: Diagnosis not present

## 2019-04-29 DIAGNOSIS — Z86718 Personal history of other venous thrombosis and embolism: Secondary | ICD-10-CM | POA: Diagnosis not present

## 2019-04-29 DIAGNOSIS — M8580 Other specified disorders of bone density and structure, unspecified site: Secondary | ICD-10-CM | POA: Diagnosis not present

## 2019-04-29 DIAGNOSIS — Z8719 Personal history of other diseases of the digestive system: Secondary | ICD-10-CM | POA: Diagnosis not present

## 2019-04-29 DIAGNOSIS — Z87891 Personal history of nicotine dependence: Secondary | ICD-10-CM | POA: Insufficient documentation

## 2019-04-29 DIAGNOSIS — K921 Melena: Secondary | ICD-10-CM | POA: Insufficient documentation

## 2019-04-29 MED ORDER — SODIUM CHLORIDE 0.9 % IV SOLN
Freq: Once | INTRAVENOUS | Status: AC
  Administered 2019-04-29: 10:00:00 via INTRAVENOUS

## 2019-04-29 MED ORDER — SODIUM CHLORIDE 0.9% FLUSH
10.0000 mL | INTRAVENOUS | Status: DC | PRN
  Administered 2019-04-29: 10 mL
  Filled 2019-04-29: qty 10

## 2019-04-29 MED ORDER — ACETAMINOPHEN 325 MG PO TABS: 650.0000 mg | ORAL_TABLET | Freq: Once | ORAL | Status: DC

## 2019-04-29 MED ORDER — HEPARIN SOD (PORK) LOCK FLUSH 100 UNIT/ML IV SOLN
500.0000 [IU] | Freq: Once | INTRAVENOUS | Status: AC | PRN
  Administered 2019-04-29: 500 [IU]

## 2019-04-29 MED ORDER — DIPHENHYDRAMINE HCL 25 MG PO CAPS: 50.0000 mg | ORAL_CAPSULE | Freq: Once | ORAL | Status: DC

## 2019-04-29 MED ORDER — TRASTUZUMAB-ANNS CHEMO 150 MG IV SOLR
450.0000 mg | Freq: Once | INTRAVENOUS | Status: AC
  Administered 2019-04-29: 450 mg via INTRAVENOUS
  Filled 2019-04-29: qty 21.43

## 2019-04-29 NOTE — Patient Instructions (Signed)
[place] at [facility]  Discharge Instructions:  You saw Monique Pho, NP, today. _______________________________________________________________  Thank you for choosing [place] at [facility] to provide your oncology and hematology care.  To afford each patient quality time with our providers, please arrive at least 15 minutes before your scheduled appointment.  You need to re-schedule your appointment if you arrive 10 or more minutes late.  We strive to give you quality time with our providers, and arriving late affects you and other patients whose appointments are after yours.  Also, if you no show three or more times for appointments you may be dismissed from the clinic.  Again, thank you for choosing [place] at [place] hope is that these requests will allow you access to exceptional care and in a timely manner. _______________________________________________________________  If you have questions after your visit, please contact our office at [phone] between the hours of 8:30 a.m. and 5:00 p.m. Voicemails left after 4:30 p.m. will not be returned until the following business day. _______________________________________________________________  For prescription refill requests, have your pharmacy contact our office. _______________________________________________________________  Recommendations made by the consultant and any test results will be sent to your referring physician. _______________________________________________________________

## 2019-04-29 NOTE — Assessment & Plan Note (Signed)
1.  Stage I (PT1CNX) HER-2 positive left breast cancer: -Left mastectomy on 08/19/2018, 1.1 cm IDC, grade 1, margins negative.  No lymph nodes were identified.  ER was 100% positive, PR 7% positive and HER-2 3+. - 12 doses of weekly paclitaxel and Herceptin from 10/22/2018 through 01/14/2019. - Last echocardiogram on 01/25/2019 shows EF of 60 to 65%.  Some degree of left ear with left ventricular wall thickness. - Started on Herceptin every 3 weeks on 02/03/2019. - She has tried anastrozole in December 2019 made her confused and forgetful. -She is started on Femara 2.5 mg daily on 02/03/2019.  She has tolerated Femara so far very well.  She did not have any confusion episodes. - She will proceed with Herceptin today. Recommend continue Letrozole daily.  - We will schedule her for Echocardiogram. - Return to clinic in 3 weeks.   2.  Osteopenia: -DEXA scan on 02/08/2018 shows T score of -1.2. -She is receiving Prolia every 6 months with Dr. Melina Copa. -She was counseled to continue calcium and vitamin D supplements.  3.  Recurrent DVTs: -She has multiple episodes of DVTs and is on Eliquis.  She is tolerating it very well.  4.  Ulcerative colitis: -She is on mesalamine and Humira. - She is followed by Gastroenterology.

## 2019-04-29 NOTE — Progress Notes (Signed)
Monique Day, [place] [id number]   CLINIC:  Medical Oncology/Hematology  PCP:  [name], NP [address] [place] [address] [phone]   REASON FOR VISIT:  Follow-up for Breast Cancer   CURRENT THERAPY: Herceptin and Letrozole.   BRIEF ONCOLOGIC HISTORY:  Oncology History  Breast cancer of upper-inner quadrant of left female breast ([place])  07/21/2018 Initial Diagnosis   Breast cancer of upper-inner quadrant of left female breast ([place])   10/22/2018 -  Chemotherapy   The patient had trastuzumab (HERCEPTIN) 300 mg in sodium chloride 0.9 % 250 mL chemo infusion, 315 mg, Intravenous,  Once, 7 of 7 cycles Dose modification: 0.06 mg/kg (1 % of original dose 6 mg/kg, Cycle 6, Reason: Other (see comments), Comment: need to resign), 6 mg/kg (original dose 6 mg/kg, Cycle 6, Reason: Other (see comments), Comment: need to reenter to sign ), 2 mg/kg (original dose 6 mg/kg, Cycle 7, Reason: Other (see comments)), 6 mg/kg (original dose 6 mg/kg, Cycle 7, Reason: Other (see comments)) Administration: 300 mg (10/22/2018), 150 mg (10/29/2018), 150 mg (11/19/2018), 450 mg (02/03/2019), 450 mg (02/24/2019), 150 mg (11/05/2018), 150 mg (11/12/2018), 150 mg (12/01/2018), 150 mg (12/10/2018), 150 mg (12/17/2018), 150 mg (12/24/2018), 150 mg (12/31/2018), 150 mg (01/07/2019), 150 mg (01/14/2019), 450 mg (03/18/2019), 450 mg (04/08/2019) PACLitaxel (TAXOL) 72 mg in sodium chloride 0.9 % 150 mL chemo infusion (</= 78m/m2), 40 mg/m2 = 72 mg (50 % of original dose 80 mg/m2), Intravenous,  Once, 3 of 3 cycles Dose modification: 40 mg/m2 (50 % of original dose 80 mg/m2, Cycle 1, Reason: Provider Judgment) Administration: 72 mg (10/22/2018), 72 mg (10/29/2018), 72 mg (11/19/2018), 72 mg (11/05/2018), 72 mg (11/12/2018), 72 mg (12/01/2018), 72 mg (12/10/2018), 72 mg (12/17/2018), 72 mg (12/24/2018), 72 mg (12/31/2018), 72 mg (01/07/2019), 72 mg (01/14/2019) trastuzumab-anns (KANJINTI) 450 mg in sodium  chloride 0.9 % 250 mL chemo infusion, 462 mg (100 % of original dose 6 mg/kg), Intravenous,  Once, 1 of 9 cycles Dose modification: 6 mg/kg (original dose 6 mg/kg, Cycle 8, Reason: Provider Judgment, Comment: change to biosimilar) Administration: 450 mg (04/29/2019)  for chemotherapy treatment.        INTERVAL HISTORY:  Ms. [id number].[email or web address]. female presents today for follow-up.  Reports overall doing well.  Denies any significant fatigue.  She states she had been experiencing BRBPR and had a sigmoidoscopy due to her ulcerative colitis flare.  Inflammation was found and was noted to be moderate in severity. She is now doing hydrocortisone enemas nightly. States she has some improvement. She continues Letrozole, tolerating well. She presents today for Cycle 8 of Herceptin.   REVIEW OF SYSTEMS:  Review of Systems  Constitutional: Negative.   HENT:  Negative.   Eyes: Negative.   Respiratory: Negative.   Cardiovascular: Negative.   Gastrointestinal: Positive for blood in stool and diarrhea.  Endocrine: Negative.   Genitourinary: Negative.    Musculoskeletal: Negative.   Skin: Negative.   Neurological: Negative.   Hematological: Negative.   Psychiatric/Behavioral: Negative.      PAST MEDICAL/SURGICAL HISTORY:  Past Medical History:  Diagnosis Date  . Adult idiopathic generalized osteoporosis   . Arthritis    "minor" (08/18/2018)  . Breast [place] breast [place]   lumpectomy  . Breast cancer, right breast ([place] 2009   lumpectomy; mastectomy  . DVT (deep venous thrombosis) ([place] "early 2000's"   RLE  . DVT (deep venous thrombosis) ([place]   .  Hemorrhoids, external   . History of blood transfusion    "related to ulcerative colitis"  . Lower abdominal pain   . Occult blood in stools   . Peripheral edema   . Seizures ([place])    one very slight seizure with the stroke; somewhere between 2005-2009  . Stroke [facility]) 2005-2009   "very light;" ; denies residual on 08/19/2018  . UC  (ulcerative colitis) ([place]) dx'd 3785   Past Surgical History:  Procedure Laterality Date  . APPENDECTOMY    . BIOPSY  04/14/2019   Procedure: BIOPSY;  Surgeon: [name], MD;  Location: [facility]O SU[facility];  Service: Endoscopy;;  . bone density  12/11/10  . BREAST BIOPSY Left 1998; 2019 X 2  . BREAST BIOPSY Right 2009  . BREAST LUMPECTOMY Left 1998  . CATARACT EXTRACTION W/ INTRAOCULAR LENS  IMPLANT, BILATERAL Bilateral   . COLONOSCOPY  02/21/2011  . COLONOSCOPY  10/06/2008  . COLONOSCOPY  12/27/01  . COLONOSCOPY  05/09/05  . COLONOSCOPY N/A 07/02/2016   Procedure: COLONOSCOPY;  Surgeon: [name], MD;  Location: [facility];  Service: Endoscopy;  Laterality: N/A;  1055  . [place]   "I was having rectal bleeding"  . FLEXIBLE SIGMOIDOSCOPY N/A 04/14/2019   Procedure: FLEXIBLE SIGMOIDOSCOPY;  Surgeon: [name], MD;  Location: [facility] SU[facility];  Service: Endoscopy;  Laterality: N/A;  1:00  . FRACTURE SURGERY    . MASTECTOMY Right 2009  . MASTECTOMY COMPLETE / SIMPLE Left 08/19/2018  . PORTACATH PLACEMENT Right 10/15/2018   Procedure: INSERTION PORT-A-CATH RIGHT SUBCLAVIN;  Surgeon: [name], MD;  Location: [facility] ORS;  Service: General;  Laterality: Right;  pt knows to arrive at 7:30  . SIMPLE MASTECTOMY WITH AXILLARY SENTINEL NODE BIOPSY Left 08/19/2018   Procedure: LEFT SIMPLE MASTECTOMY;  Surgeon: [name], MD;  Location: [place];  Service: General;  Laterality: Left;  . TONSILLECTOMY    . VENA CAVA FILTER PLACEMENT Right   . WRIST FRACTURE SURGERY Right      SOCIAL HISTORY:  Social History   Socioeconomic History  . Marital status: Widowed    Spouse name: Not on file  . Number of children: 0  . Years of education: Not on file  . Highest education level: Not on file  Occupational History  . Occupation: Optometrist: [employer]  Social Needs  . Financial resource strain: Not hard at all  . Food  insecurity    Worry: Never true    Inability: Never true  . Transportation needs    Medical: No    Non-medical: No  Tobacco Use  . Smoking status: Former Smoker    Packs/day: 2.00    Years: 40.00    Pack years: 80.00    Types: Cigarettes    Quit date: 05/05/2001    Years since quitting: 17.9  . Smokeless tobacco: Never Used  Substance and Sexual Activity  . Alcohol use: Yes    Comment: 08/18/2018 "couple drinks/year"  . Drug use: Never  . Sexual activity: Not Currently  Lifestyle  . Physical activity    Days per week: 0 days    Minutes per session: 0 min  . Stress: Not at all  Relationships  . Social connections    Talks on phone: More than three times a week    Gets together: More than three times a week    Attends religious service: More than 4 times per year  Active member of club or organization: Yes    Attends meetings of clubs or organizations: More than 4 times per year    Relationship status: Widowed  . Intimate partner violence    Fear of current or ex partner: No    Emotionally abused: No    Physically abused: No    Forced sexual activity: No  Other Topics Concern  . Not on file  Social History Narrative  . Not on file    FAMILY HISTORY:  Family History  Problem Relation Age of Onset  . Prostate cancer Father   . Colon cancer Neg Hx     CURRENT MEDICATIONS:  Outpatient Encounter Medications as of 04/29/2019  Medication Sig Note  . acetaminophen (TYLENOL) 500 MG tablet Take 500 mg by mouth every 6 (six) hours as needed for moderate pain or headache.    . Ascorbic Acid (VITAMIN C) 1000 MG tablet Take 1,000 mg by mouth daily.   . Calcium Carbonate-Vitamin D (CALCIUM 600+D) 600-400 MG-UNIT tablet Take 1 tablet by mouth daily.   . cholecalciferol (VITAMIN D3) 25 MCG (1000 UT) tablet Take 1,000 Units by mouth daily.   Marland Kitchen denosumab (PROLIA) 60 MG/ML SOLN injection Inject 60 mg into the skin every 6 (six) months. Administer in upper arm, thigh, or abdomen  10/07/2018: LAST DOSE:09/2018  . ELIQUIS 2.5 MG TABS tablet Take 1 tablet (2.5 mg total) by mouth 2 (two) times daily.   Marland Kitchen HUMIRA PEN 40 MG/0.8ML PNKT INJECT 40 MG SUBQ EVERY 14 DAYS. (Patient taking differently: Inject 40 mg as directed every 14 (fourteen) days. SATURDAYS.)   . hydrocortisone (CORTENEMA) 100 MG/60ML enema Place 1 enema (100 mg total) rectally at bedtime.   Marland Kitchen letrozole (FEMARA) 2.5 MG tablet Take 1 tablet (2.5 mg total) by mouth daily.   Marland Kitchen lidocaine-prilocaine (EMLA) cream Apply to port site 1 hour prior to appointment   . Magnesium 500 MG TABS Take 500 mg by mouth daily.   . Naphazoline-Pheniramine (OPCON-A) 0.027-0.315 % SOLN Place 1 drop into both eyes 3 (three) times daily as needed (dry eyes).  09/21/2018: As needed , rare per patient  . polyethylene glycol powder (GLYCOLAX/MIRALAX) powder Take 8.5 g by mouth daily as needed. (Patient taking differently: Take 8.5 g by mouth daily as needed (constipation.). )   . PRESCRIPTION MEDICATION Apply 1 application topically 2 (two) times daily as needed (eczema). Ketoconazole-fluticasone 1:1 compounded cream  08/03/2018: Compounded at [facility]  . prochlorperazine (COMPAZINE) 10 MG tablet Take 1 tablet (10 mg total) by mouth every 6 (six) hours as needed (Nausea or vomiting). (Patient not taking: Reported on 03/18/2019) 10/07/2018: NEW MEDICATION (PATIENT HAS NOT CHEMOTHERAPY TO DATE)   . Trastuzumab (HERCEPTIN IV) Inject into the vein every 21 ( twenty-one) days. Every 3 weeks x 1 year starting 10/22/2018    No facility-administered encounter medications on file as of 04/29/2019.     ALLERGIES:  Allergies  Allergen Reactions  . Mercaptopurine Other (See Comments)    Dropped WBC  . Sulfa Antibiotics Other (See Comments)    Extreme Weakness     PHYSICAL EXAM:  ECOG Performance status: 1  Vitals:   04/29/19 0814  BP: (!) 143/62  Pulse: 71  Resp: 16  Temp: (!) 97.5 F (36.4 C)  SpO2: 98%   Filed Weights   04/29/19 0814   Weight: 178 lb 3.2 oz (80.8 kg)    Physical Exam Constitutional:      Appearance: Normal appearance. She is obese.  HENT:  Head: Normocephalic.     Right Ear: External ear normal.     Left Ear: External ear normal.     Nose: Nose normal.  Eyes:     Extraocular Movements: Extraocular movements intact.     Conjunctiva/sclera: Conjunctivae normal.  Neck:     Musculoskeletal: Normal range of motion.  Cardiovascular:     Rate and Rhythm: Normal rate and regular rhythm.     Pulses: Normal pulses.     Heart sounds: Normal heart sounds.  Pulmonary:     Effort: Pulmonary effort is normal.     Breath sounds: Normal breath sounds.  Abdominal:     General: Bowel sounds are normal.  Musculoskeletal: Normal range of motion.  Skin:    General: Skin is warm.  Neurological:     General: No focal deficit present.     Mental Status: She is alert and oriented to person, place, and time.  Psychiatric:        Mood and Affect: Mood normal.        Behavior: Behavior normal.        Thought Content: Thought content normal.        Judgment: Judgment normal.      LABORATORY DATA:  I have reviewed the labs as listed.  CBC    Component Value Date/Time   WBC 7.0 04/08/2019 0801   RBC 4.12 04/08/2019 0801   HGB 12.6 04/08/2019 0801   HGB 14.4 03/10/2017 1254   HCT 39.0 04/08/2019 0801   HCT 41.5 03/10/2017 1254   PLT 222 04/08/2019 0801   PLT 249 03/10/2017 1254   MCV 94.7 04/08/2019 0801   MCV 91 03/10/2017 1254   MCH 30.6 04/08/2019 0801   MCHC 32.3 04/08/2019 0801   RDW 13.2 04/08/2019 0801   RDW 13.3 03/10/2017 1254   LYMPHSABS 2.6 04/08/2019 0801   LYMPHSABS 2.7 03/10/2017 1254   MONOABS 0.7 04/08/2019 0801   EOSABS 0.3 04/08/2019 0801   EOSABS 0.4 03/10/2017 1254   BASOSABS 0.0 04/08/2019 0801   BASOSABS 0.1 03/10/2017 1254   CMP Latest Ref Rng & Units 04/08/2019 02/24/2019 02/03/2019  Glucose 70 - 99 mg/dL 114(H) 100(H) 111(H)  BUN 8 - 23 mg/dL _0 Creatinine 0.44  - 1.00 mg/dL 0.92 1.05(H) 0.94  Sodium 135 - 145 mmol/L 139 139 140  Potassium 3.5 - 5.1 mmol/L 3.7 4.0 3.9  Chloride 98 - 111 mmol/L 105 104 106  CO2 22 - 32 mmol/L _1 Calcium 8.9 - 10.3 mg/dL 8.5(L) 8.9 8.5(L)  Total Protein 6.5 - 8.1 g/dL 6.8 7.0 6.5  Total Bilirubin 0.3 - 1.2 mg/dL 0.6 0.8 0.6  Alkaline Phos 38 - 126 U/L 34(L) 30(L) 30(L)  AST 15 - 41 U/L _2 ALT 0 - 44 U/L _3 DIAGNOSTIC IMAGING:  I have independently reviewed the scans and discussed with the patient.   I have reviewed Francene Finders, NP's note and agree with the documentation.  I personally performed a face-to-face visit, made revisions and my assessment and plan is as follows.    ASSESSMENT & PLAN:   Breast cancer of upper-inner quadrant of left female breast ([place]) 1.  Stage I (PT1CNX) HER-2 positive left breast cancer: -Left mastectomy on 08/19/2018, 1.1 cm IDC, grade 1, margins negative.  No lymph nodes were identified.  ER was 100% positive, PR 7% positive and HER-2 3+. - 12 doses of weekly paclitaxel and  Herceptin from 10/22/2018 through 01/14/2019. - Last echocardiogram on 01/25/2019 shows EF of 60 to 65%.  Some degree of left ear with left ventricular wall thickness. - Started on Herceptin every 3 weeks on 02/03/2019. - She has tried anastrozole in December 2019 made her confused and forgetful. -She is started on Femara 2.5 mg daily on 02/03/2019.  She has tolerated Femara so far very well.  She did not have any confusion episodes. - She will proceed with Herceptin today. Recommend continue Letrozole daily.  - We will schedule her for Echocardiogram. - Return to clinic in 3 weeks.   2.  Osteopenia: -DEXA scan on 02/08/2018 shows T score of -1.2. -She is receiving Prolia every 6 months with Dr. Melina Copa. -She was counseled to continue calcium and vitamin D supplements.  3.  Recurrent DVTs: -She has multiple episodes of DVTs and is on Eliquis.  She is tolerating it very well.  4.   Ulcerative colitis: -She is on mesalamine and Humira. - She is followed by Gastroenterology.       Orders placed this encounter:  Orders Placed This Encounter  Procedures  . ECHOCARDIOGRAM COMPLETE      Roger Shelter, [place] [phone]

## 2019-04-29 NOTE — Progress Notes (Signed)
Labs drawn 04/08/2019 with last treatment.  Labs not drawn today and lab notified to cancel the orders.

## 2019-04-29 NOTE — Progress Notes (Signed)
Patient seen by Harriet Pho, NP, with lab review and oncology follow up visit.  Verbal order Ok to treat today.    Patient tolerated therapy with no complaints voiced.  Port site clean and dry with no bruising or swelling noted at site.  Good blood return noted before and after administration of therapy.  Band aid applied.  Patient left ambulatory with VSS and no s/s of distress noted.

## 2019-05-10 ENCOUNTER — Ambulatory Visit (INDEPENDENT_AMBULATORY_CARE_PROVIDER_SITE_OTHER): Payer: Medicare Other | Admitting: Internal Medicine

## 2019-05-10 ENCOUNTER — Other Ambulatory Visit: Payer: Self-pay

## 2019-05-10 ENCOUNTER — Encounter (INDEPENDENT_AMBULATORY_CARE_PROVIDER_SITE_OTHER): Payer: Self-pay | Admitting: Internal Medicine

## 2019-05-10 VITALS — BP 146/77 | HR 81 | Temp 98.3°F | Ht 63.0 in | Wt 177.1 lb

## 2019-05-10 DIAGNOSIS — K51011 Ulcerative (chronic) pancolitis with rectal bleeding: Secondary | ICD-10-CM

## 2019-05-10 MED ORDER — MESALAMINE 1000 MG RE SUPP: 1000.0000 mg | Freq: Every day | RECTAL | 2 refills | Status: DC

## 2019-05-10 NOTE — Patient Instructions (Signed)
Please call office with progress report in 2 weeks.

## 2019-05-10 NOTE — Progress Notes (Signed)
Presenting complaint;  Follow-up for ulcerative colitis.  Database and subjective:  Monique Day is a 83 year old Caucasian female who has over 50-year history of ulcerative colitis and you had significant flareups few years ago and has been doing well on adalimumab and oral mesalamine until few weeks ago when she developed rectal bleeding.  She was treated with hydrocortisone suppositories but without symptomatic relief.  She underwent flexible sigmoidoscopy on 04/14/2019 and noted to have active disease in the rectum but mucosa sigmoid colon was normal. Adalimumab drug and antibody levels were checked.  Drug level was was 5.5 and antibody level was undetectable. Patient was treated with 2 weeks of Cort enemas.  Last dose was 1 week ago. Patient states she is still passing blood every day with her bowel movements but the amount has decreased.  She denies abdominal or anorectal pain.  She is having issues with constipation.  She has started Benefiber tablets 6 daily and she also has been using MiraLAX.  She did have a normal bowel movement this morning.  She remains with good appetite.  She says Herceptin will be stopped in December 2020. She is due for blood work next week at oncology clinic.  Current Medications: Outpatient Encounter Medications as of 05/10/2019  Medication Sig  . acetaminophen (TYLENOL) 500 MG tablet Take 500 mg by mouth. Patient states that she takes 2 by mouth prior to treatment.  . Ascorbic Acid (VITAMIN C) 1000 MG tablet Take 1,000 mg by mouth daily.  . Calcium Carbonate-Vitamin D (CALCIUM 600+D) 600-400 MG-UNIT tablet Take 1 tablet by mouth daily.  . cholecalciferol (VITAMIN D3) 25 MCG (1000 UT) tablet Take 1,000 Units by mouth daily.  Marland Kitchen denosumab (PROLIA) 60 MG/ML SOLN injection Inject 60 mg into the skin every 6 (six) months. Administer in upper arm, thigh, or abdomen  . ELIQUIS 2.5 MG TABS tablet Take 1 tablet (2.5 mg total) by mouth 2 (two) times daily.  Marland Kitchen HUMIRA PEN 40  MG/0.8ML PNKT INJECT 40 MG SUBQ EVERY 14 DAYS. (Patient taking differently: Inject 40 mg as directed every 14 (fourteen) days. SATURDAYS.)  . letrozole (FEMARA) 2.5 MG tablet Take 1 tablet (2.5 mg total) by mouth daily.  Marland Kitchen lidocaine-prilocaine (EMLA) cream Apply to port site 1 hour prior to appointment  . Magnesium 500 MG TABS Take 500 mg by mouth daily.  . Naphazoline-Pheniramine (OPCON-A) 0.027-0.315 % SOLN Place 1 drop into both eyes 3 (three) times daily as needed (dry eyes).   Marland Kitchen PRESCRIPTION MEDICATION Apply 1 application topically 2 (two) times daily as needed (eczema). Ketoconazole-fluticasone 1:1 compounded cream   . Trastuzumab (HERCEPTIN IV) Inject into the vein every 21 ( twenty-one) days. Every 3 weeks x 1 year starting 10/22/2018  . Wheat Dextrin (BENEFIBER) CHEW Chew by mouth. Patient states that she started yesterday. She took 6.  . hydrocortisone (CORTENEMA) 100 MG/60ML enema Place 1 enema (100 mg total) rectally at bedtime. (Patient not taking: Reported on 05/10/2019)  . polyethylene glycol powder (GLYCOLAX/MIRALAX) powder Take 8.5 g by mouth daily as needed. (Patient not taking: Reported on 05/10/2019)  . prochlorperazine (COMPAZINE) 10 MG tablet Take 1 tablet (10 mg total) by mouth every 6 (six) hours as needed (Nausea or vomiting). (Patient not taking: Reported on 03/18/2019)  . [DISCONTINUED] acetaminophen (TYLENOL) 500 MG tablet Take 500 mg by mouth every 6 (six) hours as needed for moderate pain or headache.    No facility-administered encounter medications on file as of 05/10/2019.      Objective: Blood pressure Marland Kitchen)  146/77, pulse 81, temperature 98.3 F (36.8 C), temperature source Oral, height _0  (1.6 m), weight 177 lb 1.6 oz (80.3 kg). Patient is alert and in no acute distress. Conjunctiva is pink. Sclera is nonicteric Oropharyngeal mucosa is normal. No neck masses or thyromegaly noted. Cardiac exam with regular rhythm normal S1 and S2. No murmur or gallop noted. Lungs  are clear to auscultation. Abdomen abdomen is full but soft with mild tenderness at LLQ.  No organomegaly or masses. No LE edema or clubbing noted.  Labs/studies Results:  CBC Latest Ref Rng & Units 04/08/2019 02/24/2019 02/03/2019  WBC 4.0 - 10.5 K/uL 7.0 7.3 6.3  Hemoglobin 12.0 - 15.0 g/dL 12.6 12.2 12.0  Hematocrit 36.0 - 46.0 % 39.0 37.8 38.2  Platelets 150 - 400 K/uL 222 230 231    CMP Latest Ref Rng & Units 04/08/2019 02/24/2019 02/03/2019  Glucose 70 - 99 mg/dL 114(H) 100(H) 111(H)  BUN 8 - 23 mg/dL _1 Creatinine 0.44 - 1.00 mg/dL 0.92 1.05(H) 0.94  Sodium 135 - 145 mmol/L 139 139 140  Potassium 3.5 - 5.1 mmol/L 3.7 4.0 3.9  Chloride 98 - 111 mmol/L 105 104 106  CO2 22 - 32 mmol/L _2 Calcium 8.9 - 10.3 mg/dL 8.5(L) 8.9 8.5(L)  Total Protein 6.5 - 8.1 g/dL 6.8 7.0 6.5  Total Bilirubin 0.3 - 1.2 mg/dL 0.6 0.8 0.6  Alkaline Phos 38 - 126 U/L 34(L) 30(L) 30(L)  AST 15 - 41 U/L _3 ALT 0 - 44 U/L _4 Hepatic Function Latest Ref Rng & Units 04/08/2019 02/24/2019 02/03/2019  Total Protein 6.5 - 8.1 g/dL 6.8 7.0 6.5  Albumin 3.5 - 5.0 g/dL 3.7 3.9 3.7  AST 15 - 41 U/L _5 ALT 0 - 44 U/L _6 Alk Phosphatase 38 - 126 U/L 34(L) 30(L) 30(L)  Total Bilirubin 0.3 - 1.2 mg/dL 0.6 0.8 0.6       Assessment:  #1.  History of pan ulcerative colitis now with active disease in the rectum.  She is on adalimumab and oral mesalamine and adalimumab drug level appears to be appropriate but not optimal but she does not have antibodies.  She must be getting benefit from this drug as her disease has not relapsed and sigmoid colon or proximally.  She may benefit from topical mesalamine and if this therapy does not work may consider changing her biologic or changing dose to every week if she does not bleed in week 1 and please in week 2 after each dose.   Plan:  Continue adalimumab at a dose of 40 mg subcu every 14 days. Continue Apriso at a dose of 1500 mg p.o.  daily. Mesalamine suppositories 4 g p.o. nightly. Patient will call with progress report in 2 weeks.  And return for office visit in 2 months.  Will review She is to have blood work next week which I will review to be sure that she is not dropping H&H.

## 2019-05-11 ENCOUNTER — Other Ambulatory Visit (INDEPENDENT_AMBULATORY_CARE_PROVIDER_SITE_OTHER): Payer: Self-pay | Admitting: Internal Medicine

## 2019-05-18 ENCOUNTER — Other Ambulatory Visit: Payer: Self-pay

## 2019-05-18 ENCOUNTER — Ambulatory Visit (HOSPITAL_COMMUNITY)
Admission: RE | Admit: 2019-05-18 | Discharge: 2019-05-18 | Disposition: A | Discharge status: Home / Self Care | Payer: Medicare Other | Source: Ambulatory Visit | Attending: Hematology | Admitting: Hematology

## 2019-05-18 DIAGNOSIS — I4510 Unspecified right bundle-branch block: Secondary | ICD-10-CM | POA: Diagnosis not present

## 2019-05-18 DIAGNOSIS — Z170 Estrogen receptor positive status [ER+]: Secondary | ICD-10-CM | POA: Insufficient documentation

## 2019-05-18 DIAGNOSIS — Z86718 Personal history of other venous thrombosis and embolism: Secondary | ICD-10-CM | POA: Diagnosis not present

## 2019-05-18 DIAGNOSIS — C50212 Malignant neoplasm of upper-inner quadrant of left female breast: Secondary | ICD-10-CM | POA: Insufficient documentation

## 2019-05-18 NOTE — Progress Notes (Signed)
*  PRELIMINARY RESULTS* Echocardiogram 2D Echocardiogram has been performed.  Monique Day 05/18/2019, 11:28 AM

## 2019-05-20 ENCOUNTER — Inpatient Hospital Stay (HOSPITAL_BASED_OUTPATIENT_CLINIC_OR_DEPARTMENT_OTHER): Payer: Medicare Other | Admitting: Hematology

## 2019-05-20 ENCOUNTER — Inpatient Hospital Stay (HOSPITAL_COMMUNITY): Payer: Medicare Other | Attending: Hematology

## 2019-05-20 ENCOUNTER — Inpatient Hospital Stay (HOSPITAL_COMMUNITY): Payer: Medicare Other

## 2019-05-20 ENCOUNTER — Other Ambulatory Visit: Payer: Self-pay

## 2019-05-20 VITALS — BP 149/62 | HR 66 | Temp 98.2°F | Resp 18

## 2019-05-20 VITALS — BP 143/65 | HR 75 | Temp 97.1°F | Resp 18 | Wt 179.0 lb

## 2019-05-20 DIAGNOSIS — C50212 Malignant neoplasm of upper-inner quadrant of left female breast: Secondary | ICD-10-CM

## 2019-05-20 DIAGNOSIS — Z882 Allergy status to sulfonamides status: Secondary | ICD-10-CM | POA: Insufficient documentation

## 2019-05-20 DIAGNOSIS — M8580 Other specified disorders of bone density and structure, unspecified site: Secondary | ICD-10-CM | POA: Insufficient documentation

## 2019-05-20 DIAGNOSIS — K921 Melena: Secondary | ICD-10-CM | POA: Diagnosis not present

## 2019-05-20 DIAGNOSIS — C50912 Malignant neoplasm of unspecified site of left female breast: Secondary | ICD-10-CM

## 2019-05-20 DIAGNOSIS — Z0000 Encounter for general adult medical examination without abnormal findings: Secondary | ICD-10-CM

## 2019-05-20 DIAGNOSIS — Z79899 Other long term (current) drug therapy: Secondary | ICD-10-CM | POA: Diagnosis not present

## 2019-05-20 DIAGNOSIS — Z87891 Personal history of nicotine dependence: Secondary | ICD-10-CM | POA: Diagnosis not present

## 2019-05-20 DIAGNOSIS — K5190 Ulcerative colitis, unspecified, without complications: Secondary | ICD-10-CM | POA: Diagnosis not present

## 2019-05-20 DIAGNOSIS — M1990 Unspecified osteoarthritis, unspecified site: Secondary | ICD-10-CM | POA: Diagnosis not present

## 2019-05-20 DIAGNOSIS — Z7901 Long term (current) use of anticoagulants: Secondary | ICD-10-CM | POA: Insufficient documentation

## 2019-05-20 DIAGNOSIS — R410 Disorientation, unspecified: Secondary | ICD-10-CM | POA: Insufficient documentation

## 2019-05-20 DIAGNOSIS — K529 Noninfective gastroenteritis and colitis, unspecified: Secondary | ICD-10-CM | POA: Insufficient documentation

## 2019-05-20 DIAGNOSIS — Z79811 Long term (current) use of aromatase inhibitors: Secondary | ICD-10-CM | POA: Insufficient documentation

## 2019-05-20 DIAGNOSIS — Z8719 Personal history of other diseases of the digestive system: Secondary | ICD-10-CM | POA: Insufficient documentation

## 2019-05-20 DIAGNOSIS — Z23 Encounter for immunization: Secondary | ICD-10-CM | POA: Diagnosis not present

## 2019-05-20 DIAGNOSIS — Z86718 Personal history of other venous thrombosis and embolism: Secondary | ICD-10-CM | POA: Diagnosis not present

## 2019-05-20 DIAGNOSIS — Z5112 Encounter for antineoplastic immunotherapy: Secondary | ICD-10-CM | POA: Diagnosis not present

## 2019-05-20 DIAGNOSIS — Z170 Estrogen receptor positive status [ER+]: Secondary | ICD-10-CM | POA: Insufficient documentation

## 2019-05-20 DIAGNOSIS — R5383 Other fatigue: Secondary | ICD-10-CM | POA: Insufficient documentation

## 2019-05-20 DIAGNOSIS — Z8042 Family history of malignant neoplasm of prostate: Secondary | ICD-10-CM | POA: Diagnosis not present

## 2019-05-20 DIAGNOSIS — Z8673 Personal history of transient ischemic attack (TIA), and cerebral infarction without residual deficits: Secondary | ICD-10-CM | POA: Insufficient documentation

## 2019-05-20 DIAGNOSIS — Z9013 Acquired absence of bilateral breasts and nipples: Secondary | ICD-10-CM | POA: Insufficient documentation

## 2019-05-20 LAB — CBC WITH DIFFERENTIAL/PLATELET
Abs Immature Granulocytes: 0.02 10*3/uL (ref 0.00–0.07)
Basophils Absolute: 0 10*3/uL (ref 0.0–0.1)
Basophils Relative: 1 %
Eosinophils Absolute: 0.2 10*3/uL (ref 0.0–0.5)
Eosinophils Relative: 3 %
HCT: 39 % (ref 36.0–46.0)
Hemoglobin: 12.3 g/dL (ref 12.0–15.0)
Immature Granulocytes: 0 %
Lymphocytes Relative: 51 %
Lymphs Abs: 3 10*3/uL (ref 0.7–4.0)
MCH: 30.1 pg (ref 26.0–34.0)
MCHC: 31.5 g/dL (ref 30.0–36.0)
MCV: 95.4 fL (ref 80.0–100.0)
Monocytes Absolute: 0.7 10*3/uL (ref 0.1–1.0)
Monocytes Relative: 13 %
Neutro Abs: 1.8 10*3/uL (ref 1.7–7.7)
Neutrophils Relative %: 32 %
Platelets: 270 10*3/uL (ref 150–400)
RBC: 4.09 MIL/uL (ref 3.87–5.11)
RDW: 13.2 % (ref 11.5–15.5)
WBC: 5.8 10*3/uL (ref 4.0–10.5)
nRBC: 0 % (ref 0.0–0.2)

## 2019-05-20 LAB — COMPREHENSIVE METABOLIC PANEL
ALT: 15 U/L (ref 0–44)
AST: 17 U/L (ref 15–41)
Albumin: 3.4 g/dL — ABNORMAL LOW (ref 3.5–5.0)
Alkaline Phosphatase: 34 U/L — ABNORMAL LOW (ref 38–126)
Anion gap: 9 (ref 5–15)
BUN: 17 mg/dL (ref 8–23)
CO2: 26 mmol/L (ref 22–32)
Calcium: 8.6 mg/dL — ABNORMAL LOW (ref 8.9–10.3)
Chloride: 102 mmol/L (ref 98–111)
Creatinine, Ser: 0.98 mg/dL (ref 0.44–1.00)
GFR calc Af Amer: >60 mL/min (ref >60)
GFR calc non Af Amer: 54 mL/min — ABNORMAL LOW (ref >60)
Glucose, Bld: 107 mg/dL — ABNORMAL HIGH (ref 70–99)
Potassium: 3.9 mmol/L (ref 3.5–5.1)
Sodium: 137 mmol/L (ref 135–145)
Total Bilirubin: 0.6 mg/dL (ref 0.3–1.2)
Total Protein: 6.8 g/dL (ref 6.5–8.1)

## 2019-05-20 MED ORDER — SODIUM CHLORIDE 0.9% FLUSH
10.0000 mL | INTRAVENOUS | Status: DC | PRN
  Administered 2019-05-20: 10 mL
  Filled 2019-05-20: qty 10

## 2019-05-20 MED ORDER — HEPARIN SOD (PORK) LOCK FLUSH 100 UNIT/ML IV SOLN
500.0000 [IU] | Freq: Once | INTRAVENOUS | Status: AC | PRN
  Administered 2019-05-20: 11:00:00 500 [IU]

## 2019-05-20 MED ORDER — SODIUM CHLORIDE 0.9 % IV SOLN
Freq: Once | INTRAVENOUS | Status: AC
  Administered 2019-05-20: 10:00:00 via INTRAVENOUS

## 2019-05-20 MED ORDER — TRASTUZUMAB-ANNS CHEMO 150 MG IV SOLR
450.0000 mg | Freq: Once | INTRAVENOUS | Status: AC
  Administered 2019-05-20: 450 mg via INTRAVENOUS
  Filled 2019-05-20: qty 21.43

## 2019-05-20 MED ORDER — DIPHENHYDRAMINE HCL 25 MG PO CAPS: 50.0000 mg | ORAL_CAPSULE | Freq: Once | ORAL | Status: DC

## 2019-05-20 MED ORDER — ACETAMINOPHEN 325 MG PO TABS: 650.0000 mg | ORAL_TABLET | Freq: Once | ORAL | Status: DC

## 2019-05-20 MED ORDER — INFLUENZA VAC A&B SA ADJ QUAD 0.5 ML IM PRSY: 0.5000 mL | PREFILLED_SYRINGE | Freq: Once | INTRAMUSCULAR | Status: DC

## 2019-05-20 NOTE — Patient Instructions (Signed)
[facility] Discharge Instructions for Patients Receiving Chemotherapy   Beginning January 23rd 2017 lab work for the [facility] will be done in the  Main lab at [facility] on 1st floor. If you have a lab appointment with the [facility] please come in thru the  Main Entrance and check in at the main information desk   Today you received the following chemotherapy agents Herceptin. Follow-up as scheduled. Call clinic for any questions or concerns  To help prevent nausea and vomiting after your treatment, we encourage you to take your nausea medication   If you develop nausea and vomiting, or diarrhea that is not controlled by your medication, call the clinic.  The clinic phone number is [phone]. Office hours are Monday-Friday 8:30am-5:00pm.  BELOW ARE SYMPTOMS THAT SHOULD BE REPORTED IMMEDIATELY:  *FEVER GREATER THAN 101.0 F  *CHILLS WITH OR WITHOUT FEVER  NAUSEA AND VOMITING THAT IS NOT CONTROLLED WITH YOUR NAUSEA MEDICATION  *UNUSUAL SHORTNESS OF BREATH  *UNUSUAL BRUISING OR BLEEDING  TENDERNESS IN MOUTH AND THROAT WITH OR WITHOUT PRESENCE OF ULCERS  *URINARY PROBLEMS  *BOWEL PROBLEMS  UNUSUAL RASH Items with * indicate a potential emergency and should be followed up as soon as possible. If you have an emergency after office hours please contact your primary care physician or go to the nearest emergency department.  Please call the clinic during office hours if you have any questions or concerns.   You may also contact the Patient Navigator at [phone] should you have any questions or need assistance in obtaining follow up care.      Resources For Cancer Patients and their Caregivers ? American Cancer Society: Can assist with transportation, wigs, general needs, runs Look Good Feel Better.        442 240 6835 ? Cancer Care: Provides financial assistance, online support groups, medication/co-pay assistance.  1-800-813-HOPE  720-642-8440) ? [place] Assists [place] cancer patients and their families through emotional , educational and financial support.  (646)675-8177 ? [place] DSS Where to apply for food stamps, Medicaid and utility assistance. 917-342-6857 ? [facility]: Transportation to medical appointments. 615-558-3311 ? Social Security Administration: May apply for disability if have a Stage IV cancer. 308-322-7611 (303) 124-7367 ? [employer], Disability and Transit Services: Assists with nutrition, care and transit needs. [phone]

## 2019-05-20 NOTE — Progress Notes (Signed)
[place], [place] [address]   CLINIC:  Medical Oncology/Hematology  PCP:  [name], NP [address] [place] [address] [phone]   REASON FOR VISIT:  Follow-up for Breast cancer   CURRENT THERAPY: Herceptin  BRIEF ONCOLOGIC HISTORY:  Oncology History  Breast cancer of upper-inner quadrant of left female breast ([place])  07/21/2018 Initial Diagnosis   Breast cancer of upper-inner quadrant of left female breast ([place])   10/22/2018 -  Chemotherapy   The patient had trastuzumab (HERCEPTIN) 300 mg in sodium chloride 0.9 % 250 mL chemo infusion, 315 mg, Intravenous,  Once, 7 of 7 cycles Dose modification: 0.06 mg/kg (1 % of original dose 6 mg/kg, Cycle 6, Reason: Other (see comments), Comment: need to resign), 6 mg/kg (original dose 6 mg/kg, Cycle 6, Reason: Other (see comments), Comment: need to reenter to sign ), 2 mg/kg (original dose 6 mg/kg, Cycle 7, Reason: Other (see comments)), 6 mg/kg (original dose 6 mg/kg, Cycle 7, Reason: Other (see comments)) Administration: 300 mg (10/22/2018), 150 mg (10/29/2018), 150 mg (11/19/2018), 450 mg (02/03/2019), 450 mg (02/24/2019), 150 mg (11/05/2018), 150 mg (11/12/2018), 150 mg (12/01/2018), 150 mg (12/10/2018), 150 mg (12/17/2018), 150 mg (12/24/2018), 150 mg (12/31/2018), 150 mg (01/07/2019), 150 mg (01/14/2019), 450 mg (03/18/2019), 450 mg (04/08/2019) PACLitaxel (TAXOL) 72 mg in sodium chloride 0.9 % 150 mL chemo infusion (</= 52m/m2), 40 mg/m2 = 72 mg (50 % of original dose 80 mg/m2), Intravenous,  Once, 3 of 3 cycles Dose modification: 40 mg/m2 (50 % of original dose 80 mg/m2, Cycle 1, Reason: Provider Judgment) Administration: 72 mg (10/22/2018), 72 mg (10/29/2018), 72 mg (11/19/2018), 72 mg (11/05/2018), 72 mg (11/12/2018), 72 mg (12/01/2018), 72 mg (12/10/2018), 72 mg (12/17/2018), 72 mg (12/24/2018), 72 mg (12/31/2018), 72 mg (01/07/2019), 72 mg (01/14/2019) trastuzumab-anns (KANJINTI) 450 mg in sodium chloride 0.9 % 250  mL chemo infusion, 462 mg (100 % of original dose 6 mg/kg), Intravenous,  Once, 2 of 9 cycles Dose modification: 6 mg/kg (original dose 6 mg/kg, Cycle 8, Reason: Provider Judgment, Comment: change to biosimilar) Administration: 450 mg (04/29/2019)  for chemotherapy treatment.        INTERVAL HISTORY:  Ms. [id number]. female presents today for follow up. She reports overall doing well. Denies any significant fatigue. Her main concern is colitis. She states it is improving with  [place] suppository. She continues on every 3 week Herceptin, tolerating well. She denies any CP, SOB, light headedness or dizziness. Denies any changes in her breast. No new lumps, masses, nipple inversion or discharge.    REVIEW OF SYSTEMS:  Review of Systems  Constitutional: Positive for fatigue.  HENT:  Negative.   Eyes: Negative.   Respiratory: Negative.   Cardiovascular: Negative.   Gastrointestinal: Positive for blood in stool.  Endocrine: Negative.   Genitourinary: Negative.    Musculoskeletal: Negative.   Skin: Negative.   Neurological: Negative.   Hematological: Negative.   Psychiatric/Behavioral: Negative.      PAST MEDICAL/SURGICAL HISTORY:  Past Medical History:  Diagnosis Date  . Adult idiopathic generalized osteoporosis   . Arthritis    "minor" (08/18/2018)  . [place]   lumpectomy  . [place] breast [place]   lumpectomy; mastectomy  . DVT (deep venous thrombosis) ([facility] "early 2000's"   RLE  . [place] (deep venous [place]) ([facility]   . Hemorrhoids, external   . History of blood transfusion    "  related to ulcerative colitis"  . Lower abdominal pain   . Occult blood in stools   . Peripheral edema   . Seizures ([place])    one very slight seizure with the stroke; somewhere between 2005-2009  . Stroke [facility]) 2005-2009   "very light;" ; denies residual on 08/19/2018  . UC (ulcerative colitis) ([place]) dx'd 3976   Past Surgical History:  Procedure  Laterality Date  . APPENDECTOMY    . BIOPSY  04/14/2019   Procedure: BIOPSY;  Surgeon: [name], MD;  Location: [facility] ENDO SUITE;  Service: Endoscopy;;  . bone density  12/11/10  . BREAST BIOPSY Left 1998; 2019 X 2  . BREAST BIOPSY Right 2009  . BREAST LUMPECTOMY Left 1998  . CATARACT EXTRACTION W/ INTRAOCULAR LENS  IMPLANT, BILATERAL Bilateral   . COLONOSCOPY  02/21/2011  . COLONOSCOPY  10/06/2008  . COLONOSCOPY  12/27/01  . COLONOSCOPY  05/09/05  . COLONOSCOPY N/A 07/02/2016   Procedure: COLONOSCOPY;  Surgeon: [name], MD;  Location: [facility] ENDO SUITE;  Service: Endoscopy;  Laterality: N/A;  1055  . [place]   "I was having rectal bleeding"  . FLEXIBLE SIGMOIDOSCOPY N/A 04/14/2019   Procedure: FLEXIBLE SIGMOIDOSCOPY;  Surgeon: [name], MD;  Location: [facility] ENDO SUITE;  Service: Endoscopy;  Laterality: N/A;  1:00  . FRACTURE SURGERY    . MASTECTOMY Right 2009  . MASTECTOMY COMPLETE / SIMPLE Left 08/19/2018  . PORTACATH PLACEMENT Right 10/15/2018   Procedure: INSERTION PORT-A-CATH RIGHT SUBCLAVIN;  Surgeon: [name], MD;  Location: [facility] ORS;  Service: General;  Laterality: Right;  pt knows to arrive at 7:30  . SIMPLE MASTECTOMY WITH AXILLARY SENTINEL NODE BIOPSY Left 08/19/2018   Procedure: LEFT SIMPLE MASTECTOMY;  Surgeon: [name], MD;  Location: [facility];  Service: General;  Laterality: Left;  . TONSILLECTOMY    . VENA CAVA FILTER PLACEMENT Right   . WRIST FRACTURE SURGERY Right      SOCIAL HISTORY:  Social History   Socioeconomic History  . Marital status: Widowed    Spouse name: Not on file  . Number of children: 0  . Years of education: Not on file  . Highest education level: Not on file  Occupational History  . Occupation: Optometrist: [employer]  Social Needs  . Financial resource strain: Not hard at all  . Food insecurity    Worry: Never true    Inability: Never true  . Transportation  needs    Medical: No    Non-medical: No  Tobacco Use  . Smoking status: Former Smoker    Packs/day: 2.00    Years: 40.00    Pack years: 80.00    Types: Cigarettes    Quit date: 05/05/2001    Years since quitting: 18.0  . Smokeless tobacco: Never Used  Substance and Sexual Activity  . Alcohol use: Yes    Comment: 08/18/2018 "couple drinks/year"  . Drug use: Never  . Sexual activity: Not Currently  Lifestyle  . Physical activity    Days per week: 0 days    Minutes per session: 0 min  . Stress: Not at all  Relationships  . Social connections    Talks on phone: More than three times a week    Gets together: More than three times a week    Attends religious service: More than 4 times per year    Active member of club or organization: Yes  Attends meetings of clubs or organizations: More than 4 times per year    Relationship status: Widowed  . Intimate partner violence    Fear of current or ex partner: No    Emotionally abused: No    Physically abused: No    Forced sexual activity: No  Other Topics Concern  . Not on file  Social History Narrative  . Not on file    FAMILY HISTORY:  Family History  Problem Relation Age of Onset  . Prostate cancer Father   . Colon cancer Neg Hx     CURRENT MEDICATIONS:  Outpatient Encounter Medications as of 05/20/2019  Medication Sig Note  . acetaminophen (TYLENOL) 500 MG tablet Take 500 mg by mouth. Patient states that she takes 2 by mouth prior to treatment.   . Adalimumab (HUMIRA PEN) 40 MG/0.8ML PNKT Inject 40 mg as directed every 14 (fourteen) days. SATURDAYS.   Marland Kitchen Ascorbic Acid (VITAMIN C) 1000 MG tablet Take 1,000 mg by mouth daily.   . Calcium Carbonate-Vitamin D (CALCIUM 600+D) 600-400 MG-UNIT tablet Take 1 tablet by mouth daily.   . cholecalciferol (VITAMIN D3) 25 MCG (1000 UT) tablet Take 1,000 Units by mouth daily.   Marland Kitchen denosumab (PROLIA) 60 MG/ML SOLN injection Inject 60 mg into the skin every 6 (six) months. Administer in  upper arm, thigh, or abdomen 10/07/2018: LAST DOSE:09/2018  . ELIQUIS 2.5 MG TABS tablet Take 1 tablet (2.5 mg total) by mouth 2 (two) times daily.   Marland Kitchen letrozole (FEMARA) 2.5 MG tablet Take 1 tablet (2.5 mg total) by mouth daily.   Marland Kitchen lidocaine-prilocaine (EMLA) cream Apply to port site 1 hour prior to appointment   . Magnesium 500 MG TABS Take 500 mg by mouth daily.   . mesalamine (CANASA) 1000 MG suppository Place 1 suppository (1,000 mg total) rectally at bedtime.   Marland Kitchen PRESCRIPTION MEDICATION Apply 1 application topically 2 (two) times daily as needed (eczema). Ketoconazole-fluticasone 1:1 compounded cream  08/03/2018: Compounded at [facility]  . Wheat Dextrin (BENEFIBER) CHEW Chew by mouth. Patient states that she started yesterday. She took 6.   . Naphazoline-Pheniramine (OPCON-A) 0.027-0.315 % SOLN Place 1 drop into both eyes 3 (three) times daily as needed (dry eyes).  09/21/2018: As needed , rare per patient  . polyethylene glycol powder (GLYCOLAX/MIRALAX) powder Take 8.5 g by mouth daily as needed. (Patient not taking: Reported on 05/10/2019)   . prochlorperazine (COMPAZINE) 10 MG tablet Take 1 tablet (10 mg total) by mouth every 6 (six) hours as needed (Nausea or vomiting). (Patient not taking: Reported on 03/18/2019) 10/07/2018: NEW MEDICATION (PATIENT HAS NOT CHEMOTHERAPY TO DATE)   . Trastuzumab (HERCEPTIN IV) Inject into the vein every 21 ( twenty-one) days. Every 3 weeks x 1 year starting 10/22/2018   . [DISCONTINUED] hydrocortisone (CORTENEMA) 100 MG/60ML enema Place 1 enema (100 mg total) rectally at bedtime. (Patient not taking: Reported on 05/20/2019)    Facility-Administered Encounter Medications as of 05/20/2019  Medication  . influenza vaccine adjuvanted (FLUAD) injection 0.5 mL    ALLERGIES:  Allergies  Allergen Reactions  . Mercaptopurine Other (See Comments)    Dropped WBC  . Sulfa Antibiotics Other (See Comments)    Extreme Weakness     PHYSICAL EXAM:  ECOG Performance  status: 1  Vitals:   05/20/19 0936  BP: (!) 143/65  Pulse: 75  Resp: 18  Temp: (!) 97.1 F (36.2 C)  SpO2: 100%   Filed Weights   05/20/19 0936  Weight: 179 lb (81.2  kg)    Physical Exam Constitutional:      Appearance: Normal appearance.  HENT:     Head: Normocephalic.     Right Ear: External ear normal.     Left Ear: External ear normal.     Nose: Nose normal.     Mouth/Throat:     Mouth: Mucous membranes are moist.     Pharynx: Oropharynx is clear.  Eyes:     Conjunctiva/sclera: Conjunctivae normal.  Neck:     Musculoskeletal: Normal range of motion.  Cardiovascular:     Rate and Rhythm: Normal rate and regular rhythm.     Pulses: Normal pulses.     Heart sounds: Normal heart sounds.  Pulmonary:     Effort: Pulmonary effort is normal.     Breath sounds: Normal breath sounds.  Abdominal:     General: Bowel sounds are normal.  Musculoskeletal: Normal range of motion.  Skin:    General: Skin is warm and dry.  Neurological:     General: No focal deficit present.     Mental Status: She is alert and oriented to person, place, and time.  Psychiatric:        Mood and Affect: Mood normal.        Behavior: Behavior normal.        Thought Content: Thought content normal.        Judgment: Judgment normal.      LABORATORY DATA:  I have reviewed the labs as listed.  CBC    Component Value Date/Time   WBC 5.8 05/20/2019 0924   RBC 4.09 05/20/2019 0924   HGB 12.3 05/20/2019 0924   HGB 14.4 03/10/2017 1254   HCT 39.0 05/20/2019 0924   HCT 41.5 03/10/2017 1254   PLT 270 05/20/2019 0924   PLT 249 03/10/2017 1254   MCV 95.4 05/20/2019 0924   MCV 91 03/10/2017 1254   MCH 30.1 05/20/2019 0924   MCHC 31.5 05/20/2019 0924   RDW 13.2 05/20/2019 0924   RDW 13.3 03/10/2017 1254   LYMPHSABS 3.0 05/20/2019 0924   LYMPHSABS 2.7 03/10/2017 1254   MONOABS 0.7 05/20/2019 0924   EOSABS 0.2 05/20/2019 0924   EOSABS 0.4 03/10/2017 1254   BASOSABS 0.0 05/20/2019 0924    BASOSABS 0.1 03/10/2017 1254   CMP Latest Ref Rng & Units 05/20/2019 04/08/2019 02/24/2019  Glucose 70 - 99 mg/dL 107(H) 114(H) 100(H)  BUN 8 - 23 mg/dL 17 18 21   Creatinine 0.44 - 1.00 mg/dL 0.98 0.92 1.05(H)  Sodium 135 - 145 mmol/L 137 139 139  Potassium 3.5 - 5.1 mmol/L 3.9 3.7 4.0  Chloride 98 - 111 mmol/L 102 105 104  CO2 22 - 32 mmol/L 26 26 26   Calcium 8.9 - 10.3 mg/dL 8.6(L) 8.5(L) 8.9  Total Protein 6.5 - 8.1 g/dL 6.8 6.8 7.0  Total Bilirubin 0.3 - 1.2 mg/dL 0.6 0.6 0.8  Alkaline Phos 38 - 126 U/L 34(L) 34(L) 30(L)  AST 15 - 41 U/L 17 18 20   ALT 0 - 44 U/L 15 16 17           ASSESSMENT & PLAN:   Breast cancer, stage 1, left (HCC) 1.  Stage I (PT1CNX) HER-2 positive left breast cancer: -Left mastectomy on 08/19/2018, 1.1 cm IDC, grade 1, margins negative.  No lymph nodes were identified.  ER was 100% positive, PR 7% positive and HER-2 3+. - 12 doses of weekly paclitaxel and Herceptin from 10/22/2018 through 01/14/2019. - Last echocardiogram on 01/25/2019 shows EF of  60 to 65%.  Some degree of left ear with left ventricular wall thickness. - Started on Herceptin every 3 weeks on 02/03/2019. - She has tried anastrozole in December 2019 made her confused and forgetful. -She is started on Femara 2.5 mg daily on 02/03/2019.  She has tolerated Femara so far very well.  She did not have any confusion episodes. - Repeat echocardiogram from September 2020 suggests EF of 60 to 65%. -Labs are acceptable to proceed with Herceptin today. -Patient will return to clinic in 3 weeks.   2.  Osteopenia: -DEXA scan on 02/08/2018 shows T score of -1.2. -She is receiving Prolia every 6 months with Dr. Melina Copa. -She was counseled to continue calcium and vitamin D supplements.  3.  Recurrent DVTs: -She has multiple episodes of DVTs and is on Eliquis.  She is tolerating it very well.  4.  Ulcerative colitis: -She is on mesalamine and Humira. - She is followed by Gastroenterology.       Orders  placed this encounter:  Orders Placed This Encounter  Procedures  . CBC with Differential  . Comprehensive metabolic panel      [place] [phone]

## 2019-05-20 NOTE — Progress Notes (Signed)
Gordon reviewed with and pt seen by RNester NP and pt approved for Herceptin infusion today per NP                     Alfredia Client tolerated Herceptin infusion well without complaints or incident. VSS upon discharge. Pt discharged self ambulatory in satisfactory condition

## 2019-05-20 NOTE — Assessment & Plan Note (Signed)
1.  Stage I (PT1CNX) HER-2 positive left breast cancer: -Left mastectomy on 08/19/2018, 1.1 cm IDC, grade 1, margins negative.  No lymph nodes were identified.  ER was 100% positive, PR 7% positive and HER-2 3+. - 12 doses of weekly paclitaxel and Herceptin from 10/22/2018 through 01/14/2019. - Last echocardiogram on 01/25/2019 shows EF of 60 to 65%.  Some degree of left ear with left ventricular wall thickness. - Started on Herceptin every 3 weeks on 02/03/2019. - She has tried anastrozole in December 2019 made her confused and forgetful. -She is started on Femara 2.5 mg daily on 02/03/2019.  She has tolerated Femara so far very well.  She did not have any confusion episodes. - Repeat echocardiogram from September 2020 suggests EF of 60 to 65%. -Labs are acceptable to proceed with Herceptin today. -Patient will return to clinic in 3 weeks.   2.  Osteopenia: -DEXA scan on 02/08/2018 shows T score of -1.2. -She is receiving Prolia every 6 months with Dr. Melina Copa. -She was counseled to continue calcium and vitamin D supplements.  3.  Recurrent DVTs: -She has multiple episodes of DVTs and is on Eliquis.  She is tolerating it very well.  4.  Ulcerative colitis: -She is on mesalamine and Humira. - She is followed by Gastroenterology.

## 2019-05-20 NOTE — Patient Instructions (Signed)
[facility] at [facility] Discharge Instructions  Labs drawn from portacath today   Thank you for choosing [facility] at [facility] to provide your oncology and hematology care.  To afford each patient quality time with our provider, please arrive at least 15 minutes before your scheduled appointment time.   If you have a lab appointment with the Cancer Center please come in thru the Main Entrance and check in at the main information desk.  You need to re-schedule your appointment should you arrive 10 or more minutes late.  We strive to give you quality time with our providers, and arriving late affects you and other patients whose appointments are after yours.  Also, if you no show three or more times for appointments you may be dismissed from the clinic at the providers discretion.     Again, thank you for choosing [facility].  Our hope is that these requests will decrease the amount of time that you wait before being seen by our physicians.       _____________________________________________________________  Should you have questions after your visit to [facility], please contact our office at [phone] between the hours of 8:00 a.m. and 4:30 p.m.  Voicemails left after 4:00 p.m. will not be returned until the following business day.  For prescription refill requests, have your pharmacy contact our office and allow 72 hours.    Due to Covid, you will need to wear a mask upon entering the hospital. If you do not have a mask, a mask will be given to you at the Main Entrance upon arrival. For doctor visits, patients may have 1 support person with them. For treatment visits, patients can not have anyone with them due to social distancing guidelines and our immunocompromised population.     

## 2019-06-09 ENCOUNTER — Other Ambulatory Visit: Payer: Self-pay

## 2019-06-10 ENCOUNTER — Encounter (HOSPITAL_COMMUNITY): Payer: Self-pay | Admitting: Hematology

## 2019-06-10 ENCOUNTER — Inpatient Hospital Stay (HOSPITAL_COMMUNITY): Payer: Medicare Other

## 2019-06-10 ENCOUNTER — Inpatient Hospital Stay (HOSPITAL_COMMUNITY): Payer: Medicare Other | Admitting: Hematology

## 2019-06-10 ENCOUNTER — Encounter (HOSPITAL_COMMUNITY): Payer: Self-pay

## 2019-06-10 VITALS — BP 154/81 | HR 88 | Temp 97.9°F | Resp 18 | Wt 176.8 lb

## 2019-06-10 DIAGNOSIS — Z170 Estrogen receptor positive status [ER+]: Secondary | ICD-10-CM

## 2019-06-10 DIAGNOSIS — C50212 Malignant neoplasm of upper-inner quadrant of left female breast: Secondary | ICD-10-CM

## 2019-06-10 DIAGNOSIS — Z5112 Encounter for antineoplastic immunotherapy: Secondary | ICD-10-CM | POA: Diagnosis not present

## 2019-06-10 DIAGNOSIS — C50912 Malignant neoplasm of unspecified site of left female breast: Secondary | ICD-10-CM

## 2019-06-10 LAB — CBC WITH DIFFERENTIAL/PLATELET
Abs Immature Granulocytes: 0.03 10*3/uL (ref 0.00–0.07)
Basophils Absolute: 0 10*3/uL (ref 0.0–0.1)
Basophils Relative: 0 %
Eosinophils Absolute: 0.4 10*3/uL (ref 0.0–0.5)
Eosinophils Relative: 5 %
HCT: 37.7 % (ref 36.0–46.0)
Hemoglobin: 12 g/dL (ref 12.0–15.0)
Immature Granulocytes: 0 %
Lymphocytes Relative: 38 %
Lymphs Abs: 3.4 10*3/uL (ref 0.7–4.0)
MCH: 30.2 pg (ref 26.0–34.0)
MCHC: 31.8 g/dL (ref 30.0–36.0)
MCV: 95 fL (ref 80.0–100.0)
Monocytes Absolute: 0.9 10*3/uL (ref 0.1–1.0)
Monocytes Relative: 10 %
Neutro Abs: 4.2 10*3/uL (ref 1.7–7.7)
Neutrophils Relative %: 47 %
Platelets: 294 10*3/uL (ref 150–400)
RBC: 3.97 MIL/uL (ref 3.87–5.11)
RDW: 13.2 % (ref 11.5–15.5)
WBC: 9 10*3/uL (ref 4.0–10.5)
nRBC: 0 % (ref 0.0–0.2)

## 2019-06-10 LAB — COMPREHENSIVE METABOLIC PANEL
ALT: 15 U/L (ref 0–44)
AST: 15 U/L (ref 15–41)
Albumin: 3.4 g/dL — ABNORMAL LOW (ref 3.5–5.0)
Alkaline Phosphatase: 35 U/L — ABNORMAL LOW (ref 38–126)
Anion gap: 7 (ref 5–15)
BUN: 19 mg/dL (ref 8–23)
CO2: 25 mmol/L (ref 22–32)
Calcium: 8.3 mg/dL — ABNORMAL LOW (ref 8.9–10.3)
Chloride: 105 mmol/L (ref 98–111)
Creatinine, Ser: 0.91 mg/dL (ref 0.44–1.00)
GFR calc Af Amer: >60 mL/min (ref >60)
GFR calc non Af Amer: 59 mL/min — ABNORMAL LOW (ref >60)
Glucose, Bld: 108 mg/dL — ABNORMAL HIGH (ref 70–99)
Potassium: 4 mmol/L (ref 3.5–5.1)
Sodium: 137 mmol/L (ref 135–145)
Total Bilirubin: 0.6 mg/dL (ref 0.3–1.2)
Total Protein: 6.9 g/dL (ref 6.5–8.1)

## 2019-06-10 MED ORDER — SODIUM CHLORIDE 0.9 % IV SOLN
Freq: Once | INTRAVENOUS | Status: AC
  Administered 2019-06-10: 10:00:00 via INTRAVENOUS

## 2019-06-10 MED ORDER — SODIUM CHLORIDE 0.9% FLUSH
10.0000 mL | INTRAVENOUS | Status: DC | PRN
  Administered 2019-06-10: 10 mL
  Filled 2019-06-10: qty 10

## 2019-06-10 MED ORDER — ACETAMINOPHEN 325 MG PO TABS: 650.0000 mg | ORAL_TABLET | Freq: Once | ORAL | Status: DC

## 2019-06-10 MED ORDER — DIPHENHYDRAMINE HCL 25 MG PO CAPS: 50.0000 mg | ORAL_CAPSULE | Freq: Once | ORAL | Status: DC

## 2019-06-10 MED ORDER — TRASTUZUMAB-ANNS CHEMO 150 MG IV SOLR
450.0000 mg | Freq: Once | INTRAVENOUS | Status: AC
  Administered 2019-06-10: 450 mg via INTRAVENOUS
  Filled 2019-06-10: qty 21.43

## 2019-06-10 MED ORDER — HEPARIN SOD (PORK) LOCK FLUSH 100 UNIT/ML IV SOLN
500.0000 [IU] | Freq: Once | INTRAVENOUS | Status: AC | PRN
  Administered 2019-06-10: 500 [IU]

## 2019-06-10 MED ORDER — INFLUENZA VAC A&B SA ADJ QUAD 0.5 ML IM PRSY
0.5000 mL | PREFILLED_SYRINGE | Freq: Once | INTRAMUSCULAR | Status: AC
  Administered 2019-06-10: 0.5 mL via INTRAMUSCULAR
  Filled 2019-06-10: qty 0.5

## 2019-06-10 NOTE — Progress Notes (Signed)
Monique Day, [place] [address]   CLINIC:  Medical Oncology/Hematology  PCP:  [name], NP [address] [place] [address] [phone]   REASON FOR VISIT:  Follow-up for left breast cancer    BRIEF ONCOLOGIC HISTORY:  Oncology History  Breast cancer of upper-inner quadrant of left female breast ([place])  07/21/2018 Initial Diagnosis   Breast cancer of upper-inner quadrant of left female breast ([place])   10/22/2018 -  Chemotherapy   The patient had trastuzumab (HERCEPTIN) 300 mg in sodium chloride 0.9 % 250 mL chemo infusion, 315 mg, Intravenous,  Once, 7 of 7 cycles Dose modification: 0.06 mg/kg (1 % of original dose 6 mg/kg, Cycle 6, Reason: Other (see comments), Comment: need to resign), 6 mg/kg (original dose 6 mg/kg, Cycle 6, Reason: Other (see comments), Comment: need to reenter to sign ), 2 mg/kg (original dose 6 mg/kg, Cycle 7, Reason: Other (see comments)), 6 mg/kg (original dose 6 mg/kg, Cycle 7, Reason: Other (see comments)) Administration: 300 mg (10/22/2018), 150 mg (10/29/2018), 150 mg (11/19/2018), 450 mg (02/03/2019), 450 mg (02/24/2019), 150 mg (11/05/2018), 150 mg (11/12/2018), 150 mg (12/01/2018), 150 mg (12/10/2018), 150 mg (12/17/2018), 150 mg (12/24/2018), 150 mg (12/31/2018), 150 mg (01/07/2019), 150 mg (01/14/2019), 450 mg (03/18/2019), 450 mg (04/08/2019) PACLitaxel (TAXOL) 72 mg in sodium chloride 0.9 % 150 mL chemo infusion (</= 37m/m2), 40 mg/m2 = 72 mg (50 % of original dose 80 mg/m2), Intravenous,  Once, 3 of 3 cycles Dose modification: 40 mg/m2 (50 % of original dose 80 mg/m2, Cycle 1, Reason: Provider Judgment) Administration: 72 mg (10/22/2018), 72 mg (10/29/2018), 72 mg (11/19/2018), 72 mg (11/05/2018), 72 mg (11/12/2018), 72 mg (12/01/2018), 72 mg (12/10/2018), 72 mg (12/17/2018), 72 mg (12/24/2018), 72 mg (12/31/2018), 72 mg (01/07/2019), 72 mg (01/14/2019) trastuzumab-anns (KANJINTI) 450 mg in sodium chloride 0.9 % 250 mL chemo infusion, 462 mg  (100 % of original dose 6 mg/kg), Intravenous,  Once, 3 of 9 cycles Dose modification: 6 mg/kg (original dose 6 mg/kg, Cycle 8, Reason: Provider Judgment, Comment: change to biosimilar) Administration: 450 mg (04/29/2019), 450 mg (05/20/2019), 450 mg (06/10/2019)  for chemotherapy treatment.       CANCER STAGING: Cancer Staging No matching staging information was found for the patient.   INTERVAL HISTORY:  Ms. [id number]. female seen for follow-up of left breast cancer.  She is continuing to tolerate Herceptin every 3 weeks very well.  She is also tolerating letrozole very well.  Lately having blood in the stools almost daily.  She tried taking mesalamine suppository which did not help.  Appetite is 100%.  Energy levels are 75%.  Mild fatigue is stable.  Denies any symptoms of PND or orthopnea.  Continuing to tolerate Prolia very well.  Denies any musculoskeletal pains.   REVIEW OF SYSTEMS:  Review of Systems  Gastrointestinal: Positive for blood in stool.  All other systems reviewed and are negative.    PAST MEDICAL/SURGICAL HISTORY:  Past Medical History:  Diagnosis Date  . Adult idiopathic generalized osteoporosis   . Arthritis    "minor" (08/18/2018)  . Breast cancer, left breast ([place] 1998   lumpectomy  . Breast cancer, right breast ([place] 2009   lumpectomy; mastectomy  . [place]VT [place]deep [place] thrombosis) ([place]early 2000's"   RLE  . DVT (deep venous thrombosis) ([place]   . Hemorrhoids, external   . History of blood transfusion    "related to ulcerative colitis"  .  Lower abdominal pain   . Occult blood in stools   . Peripheral edema   . Seizures ([place])    one very slight seizure with the stroke; somewhere between 2005-2009  . Stroke [facility]) 2005-2009   "very light;" ; denies residual on 08/19/2018  . UC (ulcerative colitis) ([place]) dx'd 1610   Past Surgical History:  Procedure Laterality Date  . APPENDECTOMY    . BIOPSY  04/14/2019   Procedure: BIOPSY;  Surgeon: [name], MD;  Location: [facility] END[facility] SU[facility];  Service: Endoscopy;;  . bone density  12/11/10  . BREAST BIOPSY Left 1998; 2019 X 2  . BREAST BIOPSY Right 2009  . BREAST LUMPECTOMY Left 1998  . CATARACT EXTRACTION W/ INTRAOCULAR LENS  IMPLANT, BILATERAL Bilateral   . COLONOSCOPY  02/21/2011  . COLONOSCOPY  10/06/2008  . COLONOSCOPY  12/27/01  . COLONOSCOPY  05/09/05  . COLONOSCOPY N/A 07/02/2016   Procedure: COLONOSCOPY;  Surgeon: [name], MD;  Location: [facility] ENDO SUITE;  Service: Endoscopy;  Laterality: N/A;  1055  . [place]   "I was having rectal bleeding"  . FLEXIBLE SIGMOIDOSCOPY N/A 04/14/2019   Procedure: FLEXIBLE SIGMOIDOSCOPY;  Surgeon: [name], MD;  Location: [facility] ENDO SUITE;  Service: Endoscopy;  Laterality: N/A;  1:00  . FRACTURE SURGERY    . MASTECTOMY Right 2009  . MASTECTOMY COMPLETE / SIMPLE Left 08/19/2018  . PORTACATH PLACEMENT Right 10/15/2018   Procedure: INSERTION PORT-A-CATH RIGHT SUBCLAVIN;  Surgeon: [name], MD;  Location: [facility] ORS;  Service: General;  Laterality: Right;  pt knows to arrive at 7:30  . SIMPLE MASTECTOMY WITH AXILLARY SENTINEL NODE BIOPSY Left 08/19/2018   Procedure: LEFT SIMPLE MASTECTOMY;  Surgeon: [name], MD;  Location: [place];  Service: General;  Laterality: Left;  . TONSILLECTOMY    . VENA CAVA FILTER PLACEMENT Right   . WRIST FRACTURE SURGERY Right      SOCIAL HISTORY:  Social History   Socioeconomic History  . Marital status: Widowed    Spouse name: Not on file  . Number of children: 0  . Years of education: Not on file  . Highest education level: Not on file  Occupational History  . Occupation: Optometrist: [employer]  Social Needs  . Financial resource strain: Not hard at all  . Food insecurity    Worry: Never true    Inability: Never true  . Transportation needs    Medical: No    Non-medical: No  Tobacco Use  . Smoking status: Former Smoker     Packs/day: 2.00    Years: 40.00    Pack years: 80.00    Types: Cigarettes    Quit date: 05/05/2001    Years since quitting: 18.1  . Smokeless tobacco: Never Used  Substance and Sexual Activity  . Alcohol use: Yes    Comment: 08/18/2018 "couple drinks/year"  . Drug use: Never  . Sexual activity: Not Currently  Lifestyle  . Physical activity    Days per week: 0 days    Minutes per session: 0 min  . Stress: Not at all  Relationships  . Social connections    Talks on phone: More than three times a week    Gets together: More than three times a week    Attends religious service: More than 4 times per year    Active member of club or organization: Yes    Attends meetings of clubs or organizations:  More than 4 times per year    Relationship status: Widowed  . Intimate partner violence    Fear of current or ex partner: No    Emotionally abused: No    Physically abused: No    Forced sexual activity: No  Other Topics Concern  . Not on file  Social History Narrative  . Not on file    FAMILY HISTORY:  Family History  Problem Relation Age of Onset  . Prostate cancer Father   . Colon cancer Neg Hx     CURRENT MEDICATIONS:  Outpatient Encounter Medications as of 06/10/2019  Medication Sig Note  . acetaminophen (TYLENOL) 500 MG tablet Take 500 mg by mouth. Patient states that she takes 2 by mouth prior to treatment.   . Adalimumab (HUMIRA PEN) 40 MG/0.8ML PNKT Inject 40 mg as directed every 14 (fourteen) days. SATURDAYS.   Marland Kitchen Ascorbic Acid (VITAMIN C) 1000 MG tablet Take 1,000 mg by mouth daily.   . Calcium Carbonate-Vitamin D (CALCIUM 600+D) 600-400 MG-UNIT tablet Take 1 tablet by mouth daily.   . cholecalciferol (VITAMIN D3) 25 MCG (1000 UT) tablet Take 1,000 Units by mouth daily.   Marland Kitchen denosumab (PROLIA) 60 MG/ML SOLN injection Inject 60 mg into the skin every 6 (six) months. Administer in upper arm, thigh, or abdomen 10/07/2018: LAST DOSE:09/2018  . ELIQUIS 2.5 MG TABS tablet Take 1  tablet (2.5 mg total) by mouth 2 (two) times daily.   Marland Kitchen letrozole (FEMARA) 2.5 MG tablet Take 1 tablet (2.5 mg total) by mouth daily.   Marland Kitchen lidocaine-prilocaine (EMLA) cream Apply to port site 1 hour prior to appointment   . Magnesium 500 MG TABS Take 500 mg by mouth daily.   . mesalamine (CANASA) 1000 MG suppository Place 1 suppository (1,000 mg total) rectally at bedtime.   . Naphazoline-Pheniramine (OPCON-A) 0.027-0.315 % SOLN Place 1 drop into both eyes 3 (three) times daily as needed (dry eyes).  09/21/2018: As needed , rare per patient  . polyethylene glycol powder (GLYCOLAX/MIRALAX) powder Take 8.5 g by mouth daily as needed. (Patient not taking: Reported on 05/10/2019)   . PRESCRIPTION MEDICATION Apply 1 application topically 2 (two) times daily as needed (eczema). Ketoconazole-fluticasone 1:1 compounded cream  08/03/2018: Compounded at [facility]  . prochlorperazine (COMPAZINE) 10 MG tablet Take 1 tablet (10 mg total) by mouth every 6 (six) hours as needed (Nausea or vomiting). (Patient not taking: Reported on 03/18/2019) 10/07/2018: NEW MEDICATION (PATIENT HAS NOT CHEMOTHERAPY TO DATE)   . Trastuzumab (HERCEPTIN IV) Inject into the vein every 21 ( twenty-one) days. Every 3 weeks x 1 year starting 10/22/2018   . Wheat Dextrin (BENEFIBER) CHEW Chew by mouth. Patient states that she started yesterday. She took 6.   . [EXPIRED] 0.9 %  sodium chloride infusion    . [EXPIRED] heparin lock flush 100 unit/mL    . [EXPIRED] influenza vaccine adjuvanted (FLUAD) injection 0.5 mL    . [EXPIRED] trastuzumab-anns (KANJINTI) 450 mg in sodium chloride 0.9 % 250 mL chemo infusion    . [DISCONTINUED] acetaminophen (TYLENOL) tablet 650 mg    . [DISCONTINUED] diphenhydrAMINE (BENADRYL) capsule 50 mg    . [DISCONTINUED] sodium chloride flush (NS) 0.9 % injection 10 mL     No facility-administered encounter medications on file as of 06/10/2019.     ALLERGIES:  Allergies  Allergen Reactions  . Mercaptopurine Other  (See Comments)    Dropped WBC  . Sulfa Antibiotics Other (See Comments)    Extreme Weakness  PHYSICAL EXAM:  ECOG Performance status: 1  Vitals:   06/10/19 0917  BP: (!) 154/81  Pulse: 88  Resp: 18  Temp: 97.9 F (36.6 C)  SpO2: 100%   Filed Weights   06/10/19 0917  Weight: 176 lb 12.8 oz (80.2 kg)    Physical Exam Vitals signs reviewed.  Constitutional:      Appearance: Normal appearance.  Cardiovascular:     Rate and Rhythm: Normal rate and regular rhythm.     Heart sounds: Normal heart sounds.  Pulmonary:     Effort: Pulmonary effort is normal.     Breath sounds: Normal breath sounds.  Abdominal:     General: There is no distension.     Palpations: Abdomen is soft. There is no mass.  Musculoskeletal:        General: No swelling.  Skin:    General: Skin is warm.  Neurological:     General: No focal deficit present.     Mental Status: She is alert and oriented to person, place, and time.  Psychiatric:        Mood and Affect: Mood normal.        Behavior: Behavior normal.      LABORATORY DATA:  I have reviewed the labs as listed.  CBC    Component Value Date/Time   WBC 9.0 06/10/2019 0915   RBC 3.97 06/10/2019 0915   HGB 12.0 06/10/2019 0915   HGB 14.4 03/10/2017 1254   HCT 37.7 06/10/2019 0915   HCT 41.5 03/10/2017 1254   PLT 294 06/10/2019 0915   PLT 249 03/10/2017 1254   MCV 95.0 06/10/2019 0915   MCV 91 03/10/2017 1254   MCH 30.2 06/10/2019 0915   MCHC 31.8 06/10/2019 0915   RDW 13.2 06/10/2019 0915   RDW 13.3 03/10/2017 1254   LYMPHSABS 3.4 06/10/2019 0915   LYMPHSABS 2.7 03/10/2017 1254   MONOABS 0.9 06/10/2019 0915   EOSABS 0.4 06/10/2019 0915   EOSABS 0.4 03/10/2017 1254   BASOSABS 0.0 06/10/2019 0915   BASOSABS 0.1 03/10/2017 1254   CMP Latest Ref Rng & Units 06/10/2019 05/20/2019 04/08/2019  Glucose 70 - 99 mg/dL 108(H) 107(H) 114(H)  BUN 8 - 23 mg/dL 19 17 18   Creatinine 0.44 - 1.00 mg/dL 0.91 0.98 0.92  Sodium 135 - 145  mmol/L 137 137 139  Potassium 3.5 - 5.1 mmol/L 4.0 3.9 3.7  Chloride 98 - 111 mmol/L 105 102 105  CO2 22 - 32 mmol/L 25 26 26   Calcium 8.9 - 10.3 mg/dL 8.3(L) 8.6(L) 8.5(L)  Total Protein 6.5 - 8.1 g/dL 6.9 6.8 6.8  Total Bilirubin 0.3 - 1.2 mg/dL 0.6 0.6 0.6  Alkaline Phos 38 - 126 U/L 35(L) 34(L) 34(L)  AST 15 - 41 U/L 15 17 18   ALT 0 - 44 U/L 15 15 16        DIAGNOSTIC IMAGING:  I have independently reviewed the scans and discussed with the patient.     ASSESSMENT & PLAN:   Breast cancer of upper-inner quadrant of left female breast ([place]) 1.  Stage I (PT1CNX) HER-2 positive left breast cancer: -Left mastectomy on 08/19/2018, 1.1 cm IDC, grade 1, margins negative.  No lymph nodes were identified.  ER positive, PR positive, HER-2 3+. -12 doses of weekly paclitaxel and Herceptin from 10/22/2018 through 01/14/2019. -Herceptin every 3 weeks started on 02/03/2019. -Anastrozole started in December 2019, made her confused and forgetful.  Switched to Femara on 02/03/2019, which she is tolerating very well without any  musculoskeletal symptoms or hot flashes. -05/18/2019 shows EF of 60-65%. -We reviewed her labs.  She may proceed with her Herceptin today.  She will come back in 6 weeks for follow-up.  2.  Osteopenia: Last DEXA scan on 02/08/2018 shows T score of -1.2. -She is continuing Prolia every 6 months with Dr. Melina Copa. -She will continue calcium and vitamin D supplements.  3.  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  She denies any bleeding episodes.  4.  Ulcerative colitis: -She is on Humira for the past few years.  She also takes oral mesalamine. -Recently she has been having blood in the stools almost every day.  She tried mesalamine suppositories which did not help.  She is following up with Dr. Laural Golden.  Total time spent is 25 minutes with more than 50% of the time spent face-to-face discussing treatment plan, surveillance and coordination  of care.    Orders placed this encounter:  Orders Placed This Encounter  Procedures  . CBC with Differential/Platelet  . Comprehensive metabolic panel      Derek Jack, MD [place] [phone]

## 2019-06-10 NOTE — Progress Notes (Signed)
Monique Day tolerated Trastuzumab infusion and Influenza vaccine well without complaints or incident. Labs reviewed with and pt seen by Dr. Delton Coombes today as well. VSS upon discharge. Pt discharged via wheelchair in satisfactory condition

## 2019-06-10 NOTE — Patient Instructions (Addendum)
[facility] Discharge Instructions for Patients Receiving Chemotherapy   Beginning January 23rd 2017 lab work for the [facility] will be done in the  Main lab at [facility] on 1st floor. If you have a lab appointment with the [place] please come in thru the  Main Entrance and check in at the main information desk   [place] at [facility] Discharge Instructions  Labs drawn from portacath today   Thank you for choosing [place] at [facility] to provide your oncology and hematology care.  To afford each patient quality time with our provider, please arrive at least 15 minutes before your scheduled appointment time.   If you have a lab appointment with the [place] please come in thru the Main Entrance and check in at the main information desk.  You need to re-schedule your appointment should you arrive 10 or more minutes late.  We strive to give you quality time with our providers, and arriving late affects you and other patients whose appointments are after yours.  Also, if you no show three or more times for appointments you may be dismissed from the clinic at the providers discretion.     Again, thank you for choosing [facility].  Our hope is that these requests will decrease the amount of time that you wait before being seen by our physicians.       _____________________________________________________________  Should you have questions after your visit to [facility], please contact our office at [phone] between the hours of 8:00 a.m. and 4:30 p.m.  Voicemails left after 4:00 p.m. will not be returned until the following business day.  For prescription refill requests, have your pharmacy contact our office and allow 72 hours.    Due to Covid, you will need to wear a mask upon entering the hospital. If you do not have a mask, a mask will be given to you at the Main Entrance upon  arrival. For doctor visits, patients may have 1 support person with them. For treatment visits, patients can not have anyone with them due to social distancing guidelines and our immunocompromised population.    Labs drawn from portacath today    The clinic phone number is [phone]. Office hours are Monday-Friday 8:30am-5:00pm.  BELOW ARE SYMPTOMS THAT SHOULD BE REPORTED IMMEDIATELY:  *FEVER GREATER THAN 101.0 F  *CHILLS WITH OR WITHOUT FEVER  NAUSEA AND VOMITING THAT IS NOT CONTROLLED WITH YOUR NAUSEA MEDICATION  *UNUSUAL SHORTNESS OF BREATH  *UNUSUAL BRUISING OR BLEEDING  TENDERNESS IN MOUTH AND THROAT WITH OR WITHOUT PRESENCE OF ULCERS  *URINARY PROBLEMS  *BOWEL PROBLEMS  UNUSUAL RASH Items with * indicate a potential emergency and should be followed up as soon as possible. If you have an emergency after office hours please contact your primary care physician or go to the nearest emergency department.  Please call the clinic during office hours if you have any questions or concerns.   You may also contact the Patient Navigator at [phone] should you have any questions or need assistance in obtaining follow up care.      Resources For Cancer Patients and their Caregivers ? American Cancer Society: Can assist with transportation, wigs, general needs, runs Look Good Feel Better.        253-216-5250 ? Cancer Care: Provides financial assistance, online support groups, medication/co-pay assistance.  1-800-813-HOPE (732) 752-3233) ? [place]  Assists [place] cancer patients and their families through emotional , educational and financial support.  515-742-7729 ? [place] DSS Where to apply for food stamps, Medicaid and utility assistance. (785)011-5182 ? RCATS: Transportation to medical appointments. (361) 404-7439 ? Social Security Administration: May apply for disability if have a Stage IV cancer. 808 450 5637  617-716-4336 ? [employer], Disability and Transit Services: Assists with nutrition, care and transit needs. [phone]

## 2019-06-10 NOTE — Patient Instructions (Addendum)
[place] Discharge Instructions for Patients Receiving Chemotherapy   Beginning January 23rd 2017 lab work for the [facility] will be done in the  Main lab at [facility] on 1st floor. If you have a lab appointment with the [place] please come in thru the  Main Entrance and check in at the main information desk   Today you received the following chemotherapy agents Trastuzumab as well as Influenza vaccine. Follow-up as scheduled. Call clinic for any questions or concerns  To help prevent nausea and vomiting after your treatment, we encourage you to take your nausea medication   If you develop nausea and vomiting, or diarrhea that is not controlled by your medication, call the clinic.  The clinic phone number is [phone]. Office hours are Monday-Friday 8:30am-5:00pm.  BELOW ARE SYMPTOMS THAT SHOULD BE REPORTED IMMEDIATELY:  *FEVER GREATER THAN 101.0 F  *CHILLS WITH OR WITHOUT FEVER  NAUSEA AND VOMITING THAT IS NOT CONTROLLED WITH YOUR NAUSEA MEDICATION  *UNUSUAL SHORTNESS OF BREATH  *UNUSUAL BRUISING OR BLEEDING  TENDERNESS IN MOUTH AND THROAT WITH OR WITHOUT PRESENCE OF ULCERS  *URINARY PROBLEMS  *BOWEL PROBLEMS  UNUSUAL RASH Items with * indicate a potential emergency and should be followed up as soon as possible. If you have an emergency after office hours please contact your primary care physician or go to the nearest emergency department.  Please call the clinic during office hours if you have any questions or concerns.   You may also contact the Patient Navigator at [phone] should you have any questions or need assistance in obtaining follow up care.      Resources For Cancer Patients and their Caregivers ? American Cancer Society: Can assist with transportation, wigs, general needs, runs Look Good Feel Better.        6808058994 ? Cancer Care: Provides financial assistance, online support groups, medication/co-pay  assistance.  1-800-813-HOPE (256)788-6697) ? [place] Assists [place] cancer patients and their families through emotional , educational and financial support.  340-163-1879 ? [place] DSS Where to apply for food stamps, Medicaid and utility assistance. (713) 591-2899 ? RCATS: Transportation to medical appointments. 626-496-6183 ? Social Security Administration: May apply for disability if have a Stage IV cancer. (248)324-2536 [phone] ? [employer], Disability and Transit Services: Assists with nutrition, care and transit needs. ([phone] 771-2362

## 2019-06-10 NOTE — Patient Instructions (Signed)
[place] at [facility] Discharge Instructions  You were seen today by Dr. Delton Coombes. He went over your recent lab results. Continue treatments every 3 weeks. He will see you back in 6 weeks for labs and follow up.   Thank you for choosing [place] at [facility] to provide your oncology and hematology care.  To afford each patient quality time with our provider, please arrive at least 15 minutes before your scheduled appointment time.   If you have a lab appointment with the [facility] please come in thru the  Main Entrance and check in at the main information desk  You need to re-schedule your appointment should you arrive 10 or more minutes late.  We strive to give you quality time with our providers, and arriving late affects you and other patients whose appointments are after yours.  Also, if you no show three or more times for appointments you may be dismissed from the clinic at the providers discretion.     Again, thank you for choosing [facility].  Our hope is that these requests will decrease the amount of time that you wait before being seen by our physicians.       _____________________________________________________________  Should you have questions after your visit to [facility], please contact our office at [phone] between the hours of 8:00 a.m. and 4:30 p.m.  Voicemails left after 4:00 p.m. will not be returned until the following business day.  For prescription refill requests, have your pharmacy contact our office and allow 72 hours.    Cancer Center Support Programs:   > Cancer Support Group  2nd Tuesday of the month 1pm-2pm, Journey Room

## 2019-06-11 NOTE — Assessment & Plan Note (Signed)
1.  Stage I (PT1CNX) HER-2 positive left breast cancer: -Left mastectomy on 08/19/2018, 1.1 cm IDC, grade 1, margins negative.  No lymph nodes were identified.  ER positive, PR positive, HER-2 3+. -12 doses of weekly paclitaxel and Herceptin from 10/22/2018 through 01/14/2019. -Herceptin every 3 weeks started on 02/03/2019. -Anastrozole started in December 2019, made her confused and forgetful.  Switched to Femara on 02/03/2019, which she is tolerating very well without any musculoskeletal symptoms or hot flashes. -05/18/2019 shows EF of 60-65%. -We reviewed her labs.  She may proceed with her Herceptin today.  She will come back in 6 weeks for follow-up.  2.  Osteopenia: Last DEXA scan on 02/08/2018 shows T score of -1.2. -She is continuing Prolia every 6 months with Dr. Melina Copa. -She will continue calcium and vitamin D supplements.  3.  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  She denies any bleeding episodes.  4.  Ulcerative colitis: -She is on Humira for the past few years.  She also takes oral mesalamine. -Recently she has been having blood in the stools almost every day.  She tried mesalamine suppositories which did not help.  She is following up with Dr. Laural Golden.

## 2019-07-01 ENCOUNTER — Other Ambulatory Visit: Payer: Self-pay

## 2019-07-01 ENCOUNTER — Inpatient Hospital Stay (HOSPITAL_COMMUNITY): Payer: Medicare Other

## 2019-07-01 ENCOUNTER — Inpatient Hospital Stay (HOSPITAL_COMMUNITY): Payer: Medicare Other | Attending: Hematology

## 2019-07-01 ENCOUNTER — Encounter (HOSPITAL_COMMUNITY): Payer: Self-pay

## 2019-07-01 VITALS — BP 148/65 | HR 62 | Temp 96.8°F | Resp 18 | Wt 178.3 lb

## 2019-07-01 DIAGNOSIS — Z853 Personal history of malignant neoplasm of breast: Secondary | ICD-10-CM | POA: Insufficient documentation

## 2019-07-01 DIAGNOSIS — Z7901 Long term (current) use of anticoagulants: Secondary | ICD-10-CM | POA: Diagnosis not present

## 2019-07-01 DIAGNOSIS — Z8042 Family history of malignant neoplasm of prostate: Secondary | ICD-10-CM | POA: Diagnosis not present

## 2019-07-01 DIAGNOSIS — Z87891 Personal history of nicotine dependence: Secondary | ICD-10-CM | POA: Diagnosis not present

## 2019-07-01 DIAGNOSIS — C50212 Malignant neoplasm of upper-inner quadrant of left female breast: Secondary | ICD-10-CM | POA: Insufficient documentation

## 2019-07-01 DIAGNOSIS — Z5112 Encounter for antineoplastic immunotherapy: Secondary | ICD-10-CM | POA: Insufficient documentation

## 2019-07-01 DIAGNOSIS — Z79899 Other long term (current) drug therapy: Secondary | ICD-10-CM | POA: Diagnosis not present

## 2019-07-01 DIAGNOSIS — C50912 Malignant neoplasm of unspecified site of left female breast: Secondary | ICD-10-CM

## 2019-07-01 DIAGNOSIS — K921 Melena: Secondary | ICD-10-CM | POA: Insufficient documentation

## 2019-07-01 DIAGNOSIS — Z86718 Personal history of other venous thrombosis and embolism: Secondary | ICD-10-CM | POA: Insufficient documentation

## 2019-07-01 DIAGNOSIS — M8580 Other specified disorders of bone density and structure, unspecified site: Secondary | ICD-10-CM | POA: Diagnosis not present

## 2019-07-01 DIAGNOSIS — Z170 Estrogen receptor positive status [ER+]: Secondary | ICD-10-CM

## 2019-07-01 LAB — COMPREHENSIVE METABOLIC PANEL
ALT: 17 U/L (ref 0–44)
AST: 18 U/L (ref 15–41)
Albumin: 3.5 g/dL (ref 3.5–5.0)
Alkaline Phosphatase: 31 U/L — ABNORMAL LOW (ref 38–126)
Anion gap: 8 (ref 5–15)
BUN: 19 mg/dL (ref 8–23)
CO2: 26 mmol/L (ref 22–32)
Calcium: 8.7 mg/dL — ABNORMAL LOW (ref 8.9–10.3)
Chloride: 103 mmol/L (ref 98–111)
Creatinine, Ser: 0.98 mg/dL (ref 0.44–1.00)
GFR calc Af Amer: >60 mL/min (ref >60)
GFR calc non Af Amer: 54 mL/min — ABNORMAL LOW (ref >60)
Glucose, Bld: 98 mg/dL (ref 70–99)
Potassium: 4 mmol/L (ref 3.5–5.1)
Sodium: 137 mmol/L (ref 135–145)
Total Bilirubin: 0.7 mg/dL (ref 0.3–1.2)
Total Protein: 7.1 g/dL (ref 6.5–8.1)

## 2019-07-01 LAB — CBC WITH DIFFERENTIAL/PLATELET
Abs Immature Granulocytes: 0.02 10*3/uL (ref 0.00–0.07)
Basophils Absolute: 0 10*3/uL (ref 0.0–0.1)
Basophils Relative: 1 %
Eosinophils Absolute: 0.4 10*3/uL (ref 0.0–0.5)
Eosinophils Relative: 7 %
HCT: 38.6 % (ref 36.0–46.0)
Hemoglobin: 12.1 g/dL (ref 12.0–15.0)
Immature Granulocytes: 0 %
Lymphocytes Relative: 38 %
Lymphs Abs: 2.6 10*3/uL (ref 0.7–4.0)
MCH: 30 pg (ref 26.0–34.0)
MCHC: 31.3 g/dL (ref 30.0–36.0)
MCV: 95.5 fL (ref 80.0–100.0)
Monocytes Absolute: 0.7 10*3/uL (ref 0.1–1.0)
Monocytes Relative: 11 %
Neutro Abs: 3 10*3/uL (ref 1.7–7.7)
Neutrophils Relative %: 43 %
Platelets: 225 10*3/uL (ref 150–400)
RBC: 4.04 MIL/uL (ref 3.87–5.11)
RDW: 13.4 % (ref 11.5–15.5)
WBC: 6.8 10*3/uL (ref 4.0–10.5)
nRBC: 0 % (ref 0.0–0.2)

## 2019-07-01 MED ORDER — TRASTUZUMAB-ANNS CHEMO 150 MG IV SOLR
450.0000 mg | Freq: Once | INTRAVENOUS | Status: AC
  Administered 2019-07-01: 11:00:00 450 mg via INTRAVENOUS
  Filled 2019-07-01: qty 21.43

## 2019-07-01 MED ORDER — HEPARIN SOD (PORK) LOCK FLUSH 100 UNIT/ML IV SOLN
500.0000 [IU] | Freq: Once | INTRAVENOUS | Status: AC | PRN
  Administered 2019-07-01: 11:00:00 500 [IU]

## 2019-07-01 MED ORDER — SODIUM CHLORIDE 0.9% FLUSH
10.0000 mL | INTRAVENOUS | Status: DC | PRN
  Administered 2019-07-01: 10 mL
  Filled 2019-07-01: qty 10

## 2019-07-01 MED ORDER — SODIUM CHLORIDE 0.9 % IV SOLN
Freq: Once | INTRAVENOUS | Status: AC
  Administered 2019-07-01: 10:00:00 via INTRAVENOUS

## 2019-07-01 MED ORDER — ACETAMINOPHEN 325 MG PO TABS: 650.0000 mg | ORAL_TABLET | Freq: Once | ORAL | Status: DC

## 2019-07-01 MED ORDER — DIPHENHYDRAMINE HCL 25 MG PO CAPS: 50.0000 mg | ORAL_CAPSULE | Freq: Once | ORAL | Status: DC

## 2019-07-01 NOTE — Progress Notes (Signed)
Pt presents today for Herceptin . Labs drawn from port today. MAR reviewed and updated. Pt has no complaints of any changes since the lat visit.   Treatment given today per MD orders. Tolerated infusion without adverse affects. Vital signs stable. No complaints at this time. Discharged from clinic ambulatory. F/U with [facility] as scheduled.

## 2019-07-01 NOTE — Patient Instructions (Signed)
[facility] Discharge Instructions for Patients Receiving Chemotherapy  Today you received the following chemotherapy agents   To help prevent nausea and vomiting after your treatment, we encourage you to take your nausea medication   If you develop nausea and vomiting that is not controlled by your nausea medication, call the clinic.   BELOW ARE SYMPTOMS THAT SHOULD BE REPORTED IMMEDIATELY:  *FEVER GREATER THAN 100.5 F  *CHILLS WITH OR WITHOUT FEVER  NAUSEA AND VOMITING THAT IS NOT CONTROLLED WITH YOUR NAUSEA MEDICATION  *UNUSUAL SHORTNESS OF BREATH  *UNUSUAL BRUISING OR BLEEDING  TENDERNESS IN MOUTH AND THROAT WITH OR WITHOUT PRESENCE OF ULCERS  *URINARY PROBLEMS  *BOWEL PROBLEMS  UNUSUAL RASH Items with * indicate a potential emergency and should be followed up as soon as possible.  Feel free to call the clinic should you have any questions or concerns. The clinic phone number is [phone].  Please show the CHEMO ALERT CARD at check-in to the Emergency Department and triage nurse.   

## 2019-07-22 ENCOUNTER — Inpatient Hospital Stay (HOSPITAL_COMMUNITY): Payer: Medicare Other | Admitting: Hematology

## 2019-07-22 ENCOUNTER — Inpatient Hospital Stay (HOSPITAL_COMMUNITY): Payer: Medicare Other | Attending: Hematology

## 2019-07-22 ENCOUNTER — Encounter (HOSPITAL_COMMUNITY): Payer: Self-pay | Admitting: Hematology

## 2019-07-22 ENCOUNTER — Other Ambulatory Visit: Payer: Self-pay

## 2019-07-22 ENCOUNTER — Inpatient Hospital Stay (HOSPITAL_COMMUNITY): Payer: Medicare Other

## 2019-07-22 VITALS — BP 147/75 | HR 93 | Temp 96.9°F | Resp 20 | Wt 176.2 lb

## 2019-07-22 VITALS — BP 131/68 | HR 67 | Temp 97.5°F | Resp 20

## 2019-07-22 DIAGNOSIS — K5190 Ulcerative colitis, unspecified, without complications: Secondary | ICD-10-CM | POA: Insufficient documentation

## 2019-07-22 DIAGNOSIS — Z8042 Family history of malignant neoplasm of prostate: Secondary | ICD-10-CM | POA: Diagnosis not present

## 2019-07-22 DIAGNOSIS — Z86718 Personal history of other venous thrombosis and embolism: Secondary | ICD-10-CM | POA: Diagnosis not present

## 2019-07-22 DIAGNOSIS — M1990 Unspecified osteoarthritis, unspecified site: Secondary | ICD-10-CM | POA: Insufficient documentation

## 2019-07-22 DIAGNOSIS — Z882 Allergy status to sulfonamides status: Secondary | ICD-10-CM | POA: Insufficient documentation

## 2019-07-22 DIAGNOSIS — Z87891 Personal history of nicotine dependence: Secondary | ICD-10-CM | POA: Insufficient documentation

## 2019-07-22 DIAGNOSIS — R197 Diarrhea, unspecified: Secondary | ICD-10-CM | POA: Diagnosis not present

## 2019-07-22 DIAGNOSIS — Z5112 Encounter for antineoplastic immunotherapy: Secondary | ICD-10-CM | POA: Insufficient documentation

## 2019-07-22 DIAGNOSIS — M8580 Other specified disorders of bone density and structure, unspecified site: Secondary | ICD-10-CM | POA: Insufficient documentation

## 2019-07-22 DIAGNOSIS — Z8719 Personal history of other diseases of the digestive system: Secondary | ICD-10-CM | POA: Diagnosis not present

## 2019-07-22 DIAGNOSIS — C50212 Malignant neoplasm of upper-inner quadrant of left female breast: Secondary | ICD-10-CM

## 2019-07-22 DIAGNOSIS — Z170 Estrogen receptor positive status [ER+]: Secondary | ICD-10-CM

## 2019-07-22 DIAGNOSIS — M2550 Pain in unspecified joint: Secondary | ICD-10-CM | POA: Diagnosis not present

## 2019-07-22 DIAGNOSIS — Z79899 Other long term (current) drug therapy: Secondary | ICD-10-CM | POA: Insufficient documentation

## 2019-07-22 DIAGNOSIS — K625 Hemorrhage of anus and rectum: Secondary | ICD-10-CM | POA: Diagnosis not present

## 2019-07-22 DIAGNOSIS — Z7901 Long term (current) use of anticoagulants: Secondary | ICD-10-CM | POA: Diagnosis not present

## 2019-07-22 DIAGNOSIS — C50912 Malignant neoplasm of unspecified site of left female breast: Secondary | ICD-10-CM

## 2019-07-22 DIAGNOSIS — R410 Disorientation, unspecified: Secondary | ICD-10-CM | POA: Insufficient documentation

## 2019-07-22 LAB — COMPREHENSIVE METABOLIC PANEL
ALT: 17 U/L (ref 0–44)
AST: 18 U/L (ref 15–41)
Albumin: 3.3 g/dL — ABNORMAL LOW (ref 3.5–5.0)
Alkaline Phosphatase: 30 U/L — ABNORMAL LOW (ref 38–126)
Anion gap: 6 (ref 5–15)
BUN: 23 mg/dL (ref 8–23)
CO2: 26 mmol/L (ref 22–32)
Calcium: 8.6 mg/dL — ABNORMAL LOW (ref 8.9–10.3)
Chloride: 107 mmol/L (ref 98–111)
Creatinine, Ser: 0.94 mg/dL (ref 0.44–1.00)
GFR calc Af Amer: >60 mL/min (ref >60)
GFR calc non Af Amer: 57 mL/min — ABNORMAL LOW (ref >60)
Glucose, Bld: 103 mg/dL — ABNORMAL HIGH (ref 70–99)
Potassium: 4.2 mmol/L (ref 3.5–5.1)
Sodium: 139 mmol/L (ref 135–145)
Total Bilirubin: 0.6 mg/dL (ref 0.3–1.2)
Total Protein: 6.7 g/dL (ref 6.5–8.1)

## 2019-07-22 LAB — CBC WITH DIFFERENTIAL/PLATELET
Abs Immature Granulocytes: 0.02 10*3/uL (ref 0.00–0.07)
Basophils Absolute: 0 10*3/uL (ref 0.0–0.1)
Basophils Relative: 0 %
Eosinophils Absolute: 0.5 10*3/uL (ref 0.0–0.5)
Eosinophils Relative: 7 %
HCT: 36.6 % (ref 36.0–46.0)
Hemoglobin: 11.6 g/dL — ABNORMAL LOW (ref 12.0–15.0)
Immature Granulocytes: 0 %
Lymphocytes Relative: 38 %
Lymphs Abs: 2.6 10*3/uL (ref 0.7–4.0)
MCH: 30.4 pg (ref 26.0–34.0)
MCHC: 31.7 g/dL (ref 30.0–36.0)
MCV: 96.1 fL (ref 80.0–100.0)
Monocytes Absolute: 0.7 10*3/uL (ref 0.1–1.0)
Monocytes Relative: 11 %
Neutro Abs: 3 10*3/uL (ref 1.7–7.7)
Neutrophils Relative %: 44 %
Platelets: 224 10*3/uL (ref 150–400)
RBC: 3.81 MIL/uL — ABNORMAL LOW (ref 3.87–5.11)
RDW: 13.7 % (ref 11.5–15.5)
WBC: 6.8 10*3/uL (ref 4.0–10.5)
nRBC: 0 % (ref 0.0–0.2)

## 2019-07-22 MED ORDER — SODIUM CHLORIDE 0.9 % IV SOLN
Freq: Once | INTRAVENOUS | Status: AC
  Administered 2019-07-22: 10:00:00 via INTRAVENOUS

## 2019-07-22 MED ORDER — SODIUM CHLORIDE 0.9% FLUSH
10.0000 mL | Freq: Once | INTRAVENOUS | Status: AC
  Administered 2019-07-22: 10 mL

## 2019-07-22 MED ORDER — TRASTUZUMAB-ANNS CHEMO 150 MG IV SOLR
450.0000 mg | Freq: Once | INTRAVENOUS | Status: AC
  Administered 2019-07-22: 450 mg via INTRAVENOUS
  Filled 2019-07-22: qty 21.43

## 2019-07-22 MED ORDER — DIPHENHYDRAMINE HCL 25 MG PO CAPS: 50.0000 mg | ORAL_CAPSULE | Freq: Once | ORAL | Status: DC

## 2019-07-22 MED ORDER — HEPARIN SOD (PORK) LOCK FLUSH 100 UNIT/ML IV SOLN
500.0000 [IU] | Freq: Once | INTRAVENOUS | Status: AC
  Administered 2019-07-22: 500 [IU] via INTRAVENOUS

## 2019-07-22 MED ORDER — ACETAMINOPHEN 325 MG PO TABS: 650.0000 mg | ORAL_TABLET | Freq: Once | ORAL | Status: DC

## 2019-07-22 NOTE — Patient Instructions (Signed)
[facility] Discharge Instructions for Patients Receiving Chemotherapy  Today you received the following chemotherapy agents   To help prevent nausea and vomiting after your treatment, we encourage you to take your nausea medication   If you develop nausea and vomiting that is not controlled by your nausea medication, call the clinic.   BELOW ARE SYMPTOMS THAT SHOULD BE REPORTED IMMEDIATELY:  *FEVER GREATER THAN 100.5 F  *CHILLS WITH OR WITHOUT FEVER  NAUSEA AND VOMITING THAT IS NOT CONTROLLED WITH YOUR NAUSEA MEDICATION  *UNUSUAL SHORTNESS OF BREATH  *UNUSUAL BRUISING OR BLEEDING  TENDERNESS IN MOUTH AND THROAT WITH OR WITHOUT PRESENCE OF ULCERS  *URINARY PROBLEMS  *BOWEL PROBLEMS  UNUSUAL RASH Items with * indicate a potential emergency and should be followed up as soon as possible.  Feel free to call the clinic should you have any questions or concerns. The clinic phone number is [phone].  Please show the CHEMO ALERT CARD at check-in to the Emergency Department and triage nurse.   

## 2019-07-22 NOTE — Progress Notes (Signed)
Patient presents today for treatment and follow up visit with RNester NP. MAR reviewed and updated. Patient has no complaints of any pain or changes since the last visit. Labs reviewed by RNester NP.   Treatment given today per MD orders. Tolerated infusion without adverse affects. Vital signs stable. No complaints at this time. Discharged from clinic ambulatory. F/U with [facility] as scheduled.

## 2019-07-22 NOTE — Assessment & Plan Note (Signed)
1.  Stage I (PT1CNX) HER-2 positive left breast cancer: -Left mastectomy on 08/19/2018, 1.1 cm IDC, grade 1, margins negative.  No lymph nodes were identified.  ER positive, PR positive, HER-2 3+. -12 doses of weekly paclitaxel and Herceptin from 10/22/2018 through 01/14/2019. -Herceptin every 3 weeks started on 02/03/2019. -Anastrozole started in December 2019, made her confused and forgetful.  Switched to Femara on 02/03/2019, which she is tolerating very well without any musculoskeletal symptoms or hot flashes. -05/18/2019 shows EF of 60-65%. -We reviewed her labs.  She may proceed with her Herceptin today.   -Plan to repeat echocardiogram at the end of December. -Return to clinic in 6 weeks.  2.  Osteopenia: Last DEXA scan on 02/08/2018 shows T score of -1.2. -She is continuing Prolia every 6 months with Dr. Melina Copa. -She will continue calcium and vitamin D supplements.  3.  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  She denies any bleeding episodes.  4.  Ulcerative colitis: -She is on Humira for the past few years.  She also takes oral mesalamine. -She is following up with Dr. Laural Golden.

## 2019-07-22 NOTE — Progress Notes (Signed)
[place], [place] [address]   CLINIC:  Medical Oncology/Hematology  PCP:  [name], NP [address] [place] [address] [phone]   REASON FOR VISIT:  Follow-up for breast cancer  CURRENT THERAPY: Herceptin  BRIEF ONCOLOGIC HISTORY:  Oncology History  Breast cancer of upper-inner quadrant of left female breast ([place])  07/21/2018 Initial Diagnosis   Breast cancer of upper-inner quadrant of left female breast ([place])   10/22/2018 -  Chemotherapy   The patient had trastuzumab (HERCEPTIN) 300 mg in sodium chloride 0.9 % 250 mL chemo infusion, 315 mg, Intravenous,  Once, 7 of 7 cycles Dose modification: 0.06 mg/kg (1 % of original dose 6 mg/kg, Cycle 6, Reason: Other (see comments), Comment: need to resign), 6 mg/kg (original dose 6 mg/kg, Cycle 6, Reason: Other (see comments), Comment: need to reenter to sign ), 2 mg/kg (original dose 6 mg/kg, Cycle 7, Reason: Other (see comments)), 6 mg/kg (original dose 6 mg/kg, Cycle 7, Reason: Other (see comments)) Administration: 300 mg (10/22/2018), 150 mg (10/29/2018), 150 mg (11/19/2018), 450 mg (02/03/2019), 450 mg (02/24/2019), 150 mg (11/05/2018), 150 mg (11/12/2018), 150 mg (12/01/2018), 150 mg (12/10/2018), 150 mg (12/17/2018), 150 mg (12/24/2018), 150 mg (12/31/2018), 150 mg (01/07/2019), 150 mg (01/14/2019), 450 mg (03/18/2019), 450 mg (04/08/2019) PACLitaxel (TAXOL) 72 mg in sodium chloride 0.9 % 150 mL chemo infusion (</= 63m/m2), 40 mg/m2 = 72 mg (50 % of original dose 80 mg/m2), Intravenous,  Once, 3 of 3 cycles Dose modification: 40 mg/m2 (50 % of original dose 80 mg/m2, Cycle 1, Reason: Provider Judgment) Administration: 72 mg (10/22/2018), 72 mg (10/29/2018), 72 mg (11/19/2018), 72 mg (11/05/2018), 72 mg (11/12/2018), 72 mg (12/01/2018), 72 mg (12/10/2018), 72 mg (12/17/2018), 72 mg (12/24/2018), 72 mg (12/31/2018), 72 mg (01/07/2019), 72 mg (01/14/2019) trastuzumab-anns (KANJINTI) 450 mg in sodium chloride 0.9 % 250 mL  chemo infusion, 462 mg (100 % of original dose 6 mg/kg), Intravenous,  Once, 5 of 9 cycles Dose modification: 6 mg/kg (original dose 6 mg/kg, Cycle 8, Reason: Provider Judgment, Comment: change to biosimilar) Administration: 450 mg (04/29/2019), 450 mg (05/20/2019), 450 mg (07/22/2019), 450 mg (06/10/2019), 450 mg (07/01/2019)  for chemotherapy treatment.         INTERVAL HISTORY:  Ms. [id number]. female presents today for follow-up.  She reports overall doing well.  She denies any significant fatigue.  Appetite is stable.  No change in bowel habits.  Denies any fevers, chills, night sweats.  No weight loss.  She is currently on Herceptin tolerating well.  REVIEW OF SYSTEMS:  Review of Systems  All other systems reviewed and are negative.    PAST MEDICAL/SURGICAL HISTORY:  Past Medical History:  Diagnosis Date  . Adult idiopathic generalized osteoporosis   . Arthritis    "minor" (08/18/2018)  . Breast cancer, left breast ([place] 1998   lumpectomy  . Breast cancer, right breast ([place] 2009   lumpectomy; mastectomy  . DVT (deep venous thrombosis) ([place] "early 2000's"   RLE  . DVT (deep venous thrombosis) ([place]   . Hemorrhoids, external   . History of blood transfusion    "related to ulcerative colitis"  . Lower abdominal pain   . Occult blood in stools   . Peripheral edema   . Seizures ([place]    one very slight seizure with the stroke; somewhere between 2005-2009  . Stroke ([facility] 2005-2009   "very light;" ; denies residual on 08/19/2018  .  UC (ulcerative colitis) ([place]) dx'd 3825   Past Surgical History:  Procedure Laterality Date  . APPENDECTOMY    . BIOPSY  04/14/2019   Procedure: BIOPSY;  Surgeon: [name], MD;  Location: [facility] ENDO SUITE;  Service: Endoscopy;;  . bone density  12/11/10  . BREAST BIOPSY Left 1998; 2019 X 2  . BREAST BIOPSY Right 2009  . BREAST LUMPECTOMY Left 1998  . CATARACT EXTRACTION W/ INTRAOCULAR LENS  IMPLANT, BILATERAL Bilateral   .  COLONOSCOPY  02/21/2011  . COLONOSCOPY  10/06/2008  . COLONOSCOPY  12/27/01  . COLONOSCOPY  05/09/05  . COLONOSCOPY N/A 07/02/2016   Procedure: COLONOSCOPY;  Surgeon: [name], MD;  Location: [facility] ENDO SUITE;  Service: Endoscopy;  Laterality: N/A;  1055  . [place]   "I was having rectal bleeding"  . FLEXIBLE SIGMOIDOSCOPY N/A 04/14/2019   Procedure: FLEXIBLE SIGMOIDOSCOPY;  Surgeon: [name], MD;  Location: [facility] ENDO SUITE;  Service: Endoscopy;  Laterality: N/A;  1:00  . FRACTURE SURGERY    . MASTECTOMY Right 2009  . MASTECTOMY COMPLETE / SIMPLE Left 08/19/2018  . PORTACATH PLACEMENT Right 10/15/2018   Procedure: INSERTION PORT-A-CATH RIGHT SUBCLAVIN;  Surgeon: [name], MD;  Location: [facility] ORS;  Service: General;  Laterality: Right;  pt knows to arrive at 7:30  . SIMPLE MASTECTOMY WITH AXILLARY SENTINEL NODE BIOPSY Left 08/19/2018   Procedure: LEFT SIMPLE MASTECTOMY;  Surgeon: [name], MD;  Location: [facility];  Service: General;  Laterality: Left;  . TONSILLECTOMY    . VENA CAVA FILTER PLACEMENT Right   . WRIST FRACTURE SURGERY Right      SOCIAL HISTORY:  Social History   Socioeconomic History  . Marital status: Widowed    Spouse name: Not on file  . Number of children: 0  . Years of education: Not on file  . Highest education level: Not on file  Occupational History  . Occupation: Optometrist: [employer]  Social Needs  . Financial resource strain: Not hard at all  . Food insecurity    Worry: Never true    Inability: Never true  . Transportation needs    Medical: No    Non-medical: No  Tobacco Use  . Smoking status: Former Smoker    Packs/day: 2.00    Years: 40.00    Pack years: 80.00    Types: Cigarettes    Quit date: 05/05/2001    Years since quitting: 18.2  . Smokeless tobacco: Never Used  Substance and Sexual Activity  . Alcohol use: Yes    Comment: 08/18/2018 "couple drinks/year"  . Drug  use: Never  . Sexual activity: Not Currently  Lifestyle  . Physical activity    Days per week: 0 days    Minutes per session: 0 min  . Stress: Not at all  Relationships  . Social connections    Talks on phone: More than three times a week    Gets together: More than three times a week    Attends religious service: More than 4 times per year    Active member of club or organization: Yes    Attends meetings of clubs or organizations: More than 4 times per year    Relationship status: Widowed  . Intimate partner violence    Fear of current or ex partner: No    Emotionally abused: No    Physically abused: No    Forced sexual activity: No  Other Topics  Concern  . Not on file  Social History Narrative  . Not on file    FAMILY HISTORY:  Family History  Problem Relation Age of Onset  . Prostate cancer Father   . Colon cancer Neg Hx     CURRENT MEDICATIONS:  Outpatient Encounter Medications as of 07/22/2019  Medication Sig Note  . Adalimumab (HUMIRA PEN) 40 MG/0.8ML PNKT Inject 40 mg as directed every 14 (fourteen) days. SATURDAYS.   [place] Ascorbic Acid (VITAMIN C) 1000 MG tablet Take 1,000 mg by mouth daily.   . Calcium Carbonate-Vitamin D (CALCIUM 600+D) 600-400 MG-UNIT tablet Take 1 tablet by mouth daily.   . cholecalciferol (VITAMIN D3) 25 MCG (1000 UT) tablet Take 1,000 Units by mouth daily.   [place] Kitchen denosumab (PROLIA) 60 MG/ML SOLN injection Inject 60 mg into the skin every 6 (six) months. Administer in upper arm, thigh, or abdomen 10/07/2018: LAST DOSE:09/2018  . ELIQUIS 2.5 MG TABS tablet Take 1 tablet (2.5 mg total) by mouth 2 (two) times daily.   [place] letrozole (FEMARA) 2.5 MG tablet Take 1 tablet (2.5 mg total) by mouth daily.   . Magnesium 500 MG TABS Take 500 mg by mouth daily.   . Trastuzumab (HERCEPTIN IV) Inject into the vein every 21 ( twenty-one) days. Every 3 weeks x 1 year starting 10/22/2018   . Wheat Dextrin (BENEFIBER) CHEW Chew by mouth. Patient states that she started  yesterday. She took 6.   [place] acetaminophen (TYLENOL) 500 MG tablet Take 500 mg by mouth. Patient states that she takes 2 by mouth prior to treatment.   . lidocaine-prilocaine (EMLA) cream Apply to port site 1 hour prior to appointment (Patient not taking: Reported on 07/22/2019)   . Naphazoline-Pheniramine (OPCON-A) 0.027-0.315 % SOLN Place 1 drop into both eyes 3 (three) times daily as needed (dry eyes).  09/21/2018: As needed , rare per patient  . polyethylene glycol powder (GLYCOLAX/MIRALAX) powder Take 8.5 g by mouth daily as needed. (Patient not taking: Reported on 07/22/2019)   . PRESCRIPTION MEDICATION Apply 1 application topically 2 (two) times daily as needed (eczema). Ketoconazole-fluticasone 1:1 compounded cream  08/03/2018: Compounded at [facility]  . [DISCONTINUED] mesalamine (CANASA) 1000 MG suppository Place 1 suppository (1,000 mg total) rectally at bedtime.   . [DISCONTINUED] prochlorperazine (COMPAZINE) 10 MG tablet Take 1 tablet (10 mg total) by mouth every 6 (six) hours as needed (Nausea or vomiting). (Patient not taking: Reported on 07/22/2019) 10/07/2018: NEW MEDICATION (PATIENT HAS NOT CHEMOTHERAPY TO DATE)    Facility-Administered Encounter Medications as of 07/22/2019  Medication  . acetaminophen (TYLENOL) tablet 650 mg  . diphenhydrAMINE (BENADRYL) capsule 50 mg  . [COMPLETED] trastuzumab-anns (KANJINTI) 450 mg in sodium chloride 0.9 % 250 mL chemo infusion    ALLERGIES:  Allergies  Allergen Reactions  . Mercaptopurine Other (See Comments)    Dropped WBC  . Sulfa Antibiotics Other (See Comments)    Extreme Weakness     PHYSICAL EXAM:  ECOG Performance status: 1  Vitals:   07/22/19 0909  BP: (!) 147/75  Pulse: 93  Resp: 20  Temp: (!) 96.9 F (36.1 C)  SpO2: 96%   Filed Weights   07/22/19 0909  Weight: 176 lb 3.2 oz (79.9 kg)    Physical Exam Constitutional:      Appearance: Normal appearance.  HENT:     Head: Normocephalic.     Right Ear: External ear  normal.     Left Ear: External ear normal.     Nose: Nose normal.  Mouth/Throat:     Pharynx: Oropharynx is clear.  Eyes:     Conjunctiva/sclera: Conjunctivae normal.  Neck:     Musculoskeletal: Normal range of motion.  Cardiovascular:     Rate and Rhythm: Normal rate and regular rhythm.     Pulses: Normal pulses.     Heart sounds: Normal heart sounds.  Pulmonary:     Effort: Pulmonary effort is normal.     Breath sounds: Normal breath sounds.  Abdominal:     General: Bowel sounds are normal.  Musculoskeletal:     Comments: Decreased range of motion  Skin:    General: Skin is warm.  Neurological:     General: No focal deficit present.     Mental Status: She is alert and oriented to person, place, and time.  Psychiatric:        Mood and Affect: Mood normal.        Behavior: Behavior normal.      LABORATORY DATA:  I have reviewed the labs as listed.  CBC    Component Value Date/Time   WBC 6.8 07/22/2019 0930   RBC 3.81 (L) 07/22/2019 0930   HGB 11.6 (L) 07/22/2019 0930   HGB 14.4 03/10/2017 1254   HCT 36.6 07/22/2019 0930   HCT 41.5 03/10/2017 1254   PLT 224 07/22/2019 0930   PLT 249 03/10/2017 1254   MCV 96.1 07/22/2019 0930   MCV 91 03/10/2017 1254   MCH 30.4 07/22/2019 0930   MCHC 31.7 07/22/2019 0930   RDW 13.7 07/22/2019 0930   RDW 13.3 03/10/2017 1254   LYMPHSABS 2.6 07/22/2019 0930   LYMPHSABS 2.7 03/10/2017 1254   MONOABS 0.7 07/22/2019 0930   EOSABS 0.5 07/22/2019 0930   EOSABS 0.4 03/10/2017 1254   BASOSABS 0.0 07/22/2019 0930   BASOSABS 0.1 03/10/2017 1254   CMP Latest Ref Rng & Units 07/22/2019 07/01/2019 06/10/2019  Glucose 70 - 99 mg/dL 103(H) 98 108(H)  BUN 8 - 23 mg/dL 23 19 19   Creatinine 0.44 - 1.00 mg/dL 0.94 0.98 0.91  Sodium 135 - 145 mmol/L 139 137 137  Potassium 3.5 - 5.1 mmol/L 4.2 4.0 4.0  Chloride 98 - 111 mmol/L 107 103 105  CO2 22 - 32 mmol/L 26 26 25   Calcium 8.9 - 10.3 mg/dL 8.6(L) 8.7(L) 8.3(L)  Total Protein 6.5 - 8.1  g/dL 6.7 7.1 6.9  Total Bilirubin 0.3 - 1.2 mg/dL 0.6 0.7 0.6  Alkaline Phos 38 - 126 U/L 30(L) 31(L) 35(L)  AST 15 - 41 U/L 18 18 15   ALT 0 - 44 U/L 17 17 15          ASSESSMENT & PLAN:   Breast cancer of upper-inner quadrant of left female breast ([facility]) 1.  Stage I (PT1CNX) HER-2 positive left breast cancer: -Left mastectomy on 08/19/2018, 1.1 cm IDC, grade 1, margins negative.  No lymph nodes were identified.  ER positive, PR positive, HER-2 3+. -12 doses of weekly paclitaxel and Herceptin from 10/22/2018 through 01/14/2019. -Herceptin every 3 weeks started on 02/03/2019. -Anastrozole started in December 2019, made her confused and forgetful.  Switched to Femara on 02/03/2019, which she is tolerating very well without any musculoskeletal symptoms or hot flashes. -05/18/2019 shows EF of 60-65%. -We reviewed her labs.  She may proceed with her Herceptin today.   -Plan to repeat echocardiogram at the end of December. -Return to clinic in 6 weeks.  2.  Osteopenia: Last DEXA scan on 02/08/2018 shows T score of -1.2. -She is continuing Prolia  every 6 months with Dr. Melina Copa. -She will continue calcium and vitamin D supplements.  3.  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  She denies any bleeding episodes.  4.  Ulcerative colitis: -She is on Humira for the past few years.  She also takes oral mesalamine. -She is following up with Dr. Laural Golden.      Orders placed this encounter:  Orders Placed This Encounter  Procedures  . ECHOCARDIOGRAM COMPLETE      [place] [phone]

## 2019-07-25 ENCOUNTER — Other Ambulatory Visit: Payer: Self-pay

## 2019-07-25 ENCOUNTER — Encounter (INDEPENDENT_AMBULATORY_CARE_PROVIDER_SITE_OTHER): Payer: Self-pay | Admitting: Internal Medicine

## 2019-07-25 ENCOUNTER — Ambulatory Visit (INDEPENDENT_AMBULATORY_CARE_PROVIDER_SITE_OTHER): Payer: Medicare Other | Admitting: Internal Medicine

## 2019-07-25 VITALS — Wt 176.0 lb

## 2019-07-25 DIAGNOSIS — K51211 Ulcerative (chronic) proctitis with rectal bleeding: Secondary | ICD-10-CM | POA: Diagnosis not present

## 2019-07-25 NOTE — Patient Instructions (Signed)
Patient will call if rectal bleeding becomes more frequent or if volume increases.

## 2019-07-25 NOTE — Progress Notes (Signed)
Virtual Visit via Telephone Note  Patient had face-to-face scheduled for this afternoon.  Because of Covid-19 pandemic it was decided to change her visit to virtual and she agreed. I connected with  on 07/25/19 at  2:13 PM EST by telephone and verified that I am speaking with the correct person using two identifiers.  Location: Patient: home Provider: home   I discussed the limitations, risks, security and privacy concerns of performing an evaluation and management service by telephone and the availability of in person appointments. I also discussed with the patient that there may be a patient responsible charge related to this service. The patient expressed understanding and agreed to proceed.   History of Present Illness:  Patient is 83 year old Caucasian female with over 50-year history of pan ulcerative colitis who had been in remission on adalimumab and oral mesalamine/Lialda.  She had surveillance colonoscopy in November 2011 revealing 4 small polyps which were polypoidal colonic mucosa and biopsy from sigmoid colon revealed quiescent colitis.  Patient reported change in her bowel habits with constipation when oral mesalamine was changed to Apriso.  Patient is chronically anticoagulated because of history of DVT.  In July she noted passing small amount of blood per rectum when she had to strain.  Then she began to have mucus discharge and last less bleeding.  Apriso was discontinued.  She had flexible sigmoidoscopy on 04/14/2019 which revealed very active proctitis with distal rectal sparing.  Biopsy confirmed very active disease but no evidence of CMV.  Biopsy from sigmoid colon revealed healed colitis. Adalimumab drug and antibody levels were checked.  Drug level was adequate and antibody was undetectable. Patient was treated with 2 weeks of hydrocortisone enemas with decreasing severity of bleeding but it did not stop completely.  Patient was then begun on mesalamine suppositories which she  was able to use every night for 2 weeks.  She then developed diarrhea with loss of control and therefore stopped using this medication.  She says within few days of stopping mesalamine her incontinence resolved and she is back to having normal stools.  She generally has 1 stool per day.  At times she has urge but no defecation.  While she was in mesalamine suppository she was having to get up 4 times every night but not anymore.  She is continuing to see blood with some of her bowel movements but not daily.  She describes amount to be very small.  She denies abdominal cramping nausea or vomiting.  She feels she is 80 to 85% better.  She has 4 more doses of Herceptin.  She gets a dose every 3 weeks.  She has been watching her symptoms in reference to Humira dosing and she may notice a big difference for the first 2 to 3 days but not much change between week 1 and week 2. Patient tells me that her insurance will change from January 2021 and she is hoping a new plan will cover for Lialda.   Observations/Objective: Patient reported her weight to be 176 pounds.  She weighed 177 pounds on 05/10/2019  Lab data from 07/22/2019 WBC 6.8 H&H 11.6 and 36.6 and platelet count 224K. Serum sodium 139, potassium 4.2, chloride 107, CO2 26 Glucose 103 BUN 23 and creatinine 0.94 Serum calcium 8.6 Bilirubin 0.6, AP 30, AST 18, ALT 17, total protein 6.7 and albumin 3.3.   Assessment and Plan:  #1.  Ulcerative colitis.  Patient has history of pan ulcerative colitis of over 50 years duration.  She has  been in remission for a number of years well-documented on colonoscopy of November 2017.  Flexible sigmoidoscopy on 04/14/2019 revealed active disease in rectum.  Biopsy revealed severely active proctitis.  Adalimumab drug level was within therapeutic range and antibody undetectable.  She did not respond to 2 weeks of hydrocortisone enemas and did not do well with mesalamine suppository.  Based on her symptoms she does not  appear to be back in remission.  It remains to be seen if things will improve when she finishes Herceptin.  I am hoping that changing her insurance plan would allow her to go back on Lialda/oral mesalamine.  If she remains with active disease she may have to be switched to another biologic which would have to be other than TNF antibodies.  She is also not a candidate for 6-MP which resulted in neutropenia within 2 weeks of therapy even though her TPMT activity was normal. We will continue to monitor her symptoms for now.  Follow Up Instructions:  Patient will return for office visit on 09/27/2019. Patient advised to call office should she develop diarrhea or bleeding accelerates. I discussed the assessment and treatment plan with the patient. The patient was provided an opportunity to ask questions and all were answered. The patient agreed with the plan and demonstrated an understanding of the instructions.   The patient was advised to call back or seek an in-person evaluation if the symptoms worsen or if the condition fails to improve as anticipated.  I provided 18 minutes of non-face-to-face time during this encounter.   Hildred Laser, MD

## 2019-08-08 ENCOUNTER — Other Ambulatory Visit: Payer: Self-pay

## 2019-08-08 ENCOUNTER — Ambulatory Visit (HOSPITAL_COMMUNITY)
Admission: RE | Admit: 2019-08-08 | Discharge: 2019-08-08 | Disposition: A | Discharge status: Home / Self Care | Payer: Medicare Other | Source: Ambulatory Visit | Attending: Hematology | Admitting: Hematology

## 2019-08-08 DIAGNOSIS — Z8673 Personal history of transient ischemic attack (TIA), and cerebral infarction without residual deficits: Secondary | ICD-10-CM | POA: Diagnosis not present

## 2019-08-08 DIAGNOSIS — I080 Rheumatic disorders of both mitral and aortic valves: Secondary | ICD-10-CM | POA: Diagnosis not present

## 2019-08-08 DIAGNOSIS — Z86718 Personal history of other venous thrombosis and embolism: Secondary | ICD-10-CM | POA: Insufficient documentation

## 2019-08-08 DIAGNOSIS — C50912 Malignant neoplasm of unspecified site of left female breast: Secondary | ICD-10-CM | POA: Diagnosis not present

## 2019-08-08 DIAGNOSIS — Z5181 Encounter for therapeutic drug level monitoring: Secondary | ICD-10-CM | POA: Diagnosis not present

## 2019-08-08 DIAGNOSIS — Z79899 Other long term (current) drug therapy: Secondary | ICD-10-CM | POA: Insufficient documentation

## 2019-08-08 NOTE — Progress Notes (Signed)
*  PRELIMINARY RESULTS* Echocardiogram 2D Echocardiogram has been performed.  Monique Day 08/08/2019, 11:34 AM

## 2019-08-15 ENCOUNTER — Inpatient Hospital Stay (HOSPITAL_COMMUNITY): Payer: Medicare Other

## 2019-08-15 ENCOUNTER — Encounter (HOSPITAL_COMMUNITY): Payer: Self-pay

## 2019-08-15 ENCOUNTER — Other Ambulatory Visit: Payer: Self-pay

## 2019-08-15 ENCOUNTER — Inpatient Hospital Stay (HOSPITAL_COMMUNITY): Payer: Medicare Other | Admitting: Hematology

## 2019-08-15 VITALS — BP 139/60 | HR 63 | Temp 97.8°F | Resp 18 | Wt 179.0 lb

## 2019-08-15 DIAGNOSIS — C50212 Malignant neoplasm of upper-inner quadrant of left female breast: Secondary | ICD-10-CM

## 2019-08-15 DIAGNOSIS — Z170 Estrogen receptor positive status [ER+]: Secondary | ICD-10-CM | POA: Diagnosis not present

## 2019-08-15 DIAGNOSIS — Z5112 Encounter for antineoplastic immunotherapy: Secondary | ICD-10-CM | POA: Diagnosis not present

## 2019-08-15 LAB — CBC WITH DIFFERENTIAL/PLATELET
Abs Immature Granulocytes: 0.01 10*3/uL (ref 0.00–0.07)
Basophils Absolute: 0 10*3/uL (ref 0.0–0.1)
Basophils Relative: 1 %
Eosinophils Absolute: 0.3 10*3/uL (ref 0.0–0.5)
Eosinophils Relative: 4 %
HCT: 39.3 % (ref 36.0–46.0)
Hemoglobin: 12.6 g/dL (ref 12.0–15.0)
Immature Granulocytes: 0 %
Lymphocytes Relative: 47 %
Lymphs Abs: 3.3 10*3/uL (ref 0.7–4.0)
MCH: 30.7 pg (ref 26.0–34.0)
MCHC: 32.1 g/dL (ref 30.0–36.0)
MCV: 95.6 fL (ref 80.0–100.0)
Monocytes Absolute: 0.7 10*3/uL (ref 0.1–1.0)
Monocytes Relative: 10 %
Neutro Abs: 2.7 10*3/uL (ref 1.7–7.7)
Neutrophils Relative %: 38 %
Platelets: 215 10*3/uL (ref 150–400)
RBC: 4.11 MIL/uL (ref 3.87–5.11)
RDW: 13.4 % (ref 11.5–15.5)
WBC: 6.9 10*3/uL (ref 4.0–10.5)
nRBC: 0 % (ref 0.0–0.2)

## 2019-08-15 LAB — COMPREHENSIVE METABOLIC PANEL
ALT: 18 U/L (ref 0–44)
AST: 21 U/L (ref 15–41)
Albumin: 3.6 g/dL (ref 3.5–5.0)
Alkaline Phosphatase: 31 U/L — ABNORMAL LOW (ref 38–126)
Anion gap: 10 (ref 5–15)
BUN: 21 mg/dL (ref 8–23)
CO2: 26 mmol/L (ref 22–32)
Calcium: 9 mg/dL (ref 8.9–10.3)
Chloride: 102 mmol/L (ref 98–111)
Creatinine, Ser: 0.92 mg/dL (ref 0.44–1.00)
GFR calc Af Amer: >60 mL/min (ref >60)
GFR calc non Af Amer: 58 mL/min — ABNORMAL LOW (ref >60)
Glucose, Bld: 109 mg/dL — ABNORMAL HIGH (ref 70–99)
Potassium: 3.8 mmol/L (ref 3.5–5.1)
Sodium: 138 mmol/L (ref 135–145)
Total Bilirubin: 0.7 mg/dL (ref 0.3–1.2)
Total Protein: 7.4 g/dL (ref 6.5–8.1)

## 2019-08-15 MED ORDER — HEPARIN SOD (PORK) LOCK FLUSH 100 UNIT/ML IV SOLN
500.0000 [IU] | Freq: Once | INTRAVENOUS | Status: AC | PRN
  Administered 2019-08-15: 500 [IU]

## 2019-08-15 MED ORDER — DIPHENHYDRAMINE HCL 25 MG PO CAPS: 50.0000 mg | ORAL_CAPSULE | Freq: Once | ORAL | Status: DC

## 2019-08-15 MED ORDER — SODIUM CHLORIDE 0.9 % IV SOLN: Freq: Once | INTRAVENOUS | Status: AC

## 2019-08-15 MED ORDER — SODIUM CHLORIDE 0.9% FLUSH
10.0000 mL | INTRAVENOUS | Status: DC | PRN
  Administered 2019-08-15: 08:00:00 10 mL

## 2019-08-15 MED ORDER — TRASTUZUMAB-ANNS CHEMO 150 MG IV SOLR
450.0000 mg | Freq: Once | INTRAVENOUS | Status: AC
  Administered 2019-08-15: 450 mg via INTRAVENOUS
  Filled 2019-08-15: qty 21.43

## 2019-08-15 MED ORDER — ACETAMINOPHEN 325 MG PO TABS: 650.0000 mg | ORAL_TABLET | Freq: Once | ORAL | Status: DC

## 2019-08-15 NOTE — Assessment & Plan Note (Signed)
1.  Stage I (PT1CNX) HER-2 positive left breast cancer: -Left mastectomy on 08/19/2018, 1.1 cm IDC, grade 1, margins negative.  No lymph nodes were identified.  ER positive, PR positive, HER-2 3+. -12 doses of weekly paclitaxel and Herceptin from 10/22/2018 through 01/14/2019. -Herceptin every 3 weeks started on 02/03/2019. -Anastrozole started in December 2019, made her confused and forgetful.  Switched to Femara on 02/03/2019, which she is tolerating very well without any musculoskeletal symptoms or hot flashes. -08/08/19: ECHO: EF 65-70% -We reviewed her labs.  She may proceed with her Herceptin today.   -Continue every 3-week Herceptin. -Return to clinic in 6 weeks.  2.  Osteopenia: Last DEXA scan on 02/08/2018 shows T score of -1.2. -She is continuing Prolia every 6 months with Dr. Melina Copa. -She will continue calcium and vitamin D supplements.  3.  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  She denies any bleeding episodes.  4.  Ulcerative colitis: -She is on Humira for the past few years.  She also takes oral mesalamine. -She is following up with Dr. Laural Golden.

## 2019-08-15 NOTE — Progress Notes (Signed)
Ok to release treatment plan verbal order Reynolds Bowl, NP.    Patient tolerated therapy with no complaints voiced.  Port site clean and dry with no bruising or swelling noted at site.  Good blood return noted before and after administration of therapy.  Band aid applied.  Patient left ambulatory with VSS and no s/s of distress noted.

## 2019-08-15 NOTE — Progress Notes (Signed)
[place], [place] [address]   CLINIC:  Medical Oncology/Hematology  PCP:  [name], NP [address] [place] [address] [phone]   REASON FOR VISIT:  Follow-up for Breast Cancer   CURRENT THERAPY: Herceptin   BRIEF ONCOLOGIC HISTORY:  Oncology History  Breast cancer of upper-inner quadrant of left female breast ([place])  07/21/2018 Initial Diagnosis   Breast cancer of upper-inner quadrant of left female breast ([place])   10/22/2018 -  Chemotherapy   The patient had trastuzumab (HERCEPTIN) 300 mg in sodium chloride 0.9 % 250 mL chemo infusion, 315 mg, Intravenous,  Once, 7 of 7 cycles Dose modification: 0.06 mg/kg (1 % of original dose 6 mg/kg, Cycle 6, Reason: Other (see comments), Comment: need to resign), 6 mg/kg (original dose 6 mg/kg, Cycle 6, Reason: Other (see comments), Comment: need to reenter to sign ), 2 mg/kg (original dose 6 mg/kg, Cycle 7, Reason: Other (see comments)), 6 mg/kg (original dose 6 mg/kg, Cycle 7, Reason: Other (see comments)) Administration: 300 mg (10/22/2018), 150 mg (10/29/2018), 150 mg (11/19/2018), 450 mg (02/03/2019), 450 mg (02/24/2019), 150 mg (11/05/2018), 150 mg (11/12/2018), 150 mg (12/01/2018), 150 mg (12/10/2018), 150 mg (12/17/2018), 150 mg (12/24/2018), 150 mg (12/31/2018), 150 mg (01/07/2019), 150 mg (01/14/2019), 450 mg (03/18/2019), 450 mg (04/08/2019) PACLitaxel (TAXOL) 72 mg in sodium chloride 0.9 % 150 mL chemo infusion (</= 74m/m2), 40 mg/m2 = 72 mg (50 % of original dose 80 mg/m2), Intravenous,  Once, 3 of 3 cycles Dose modification: 40 mg/m2 (50 % of original dose 80 mg/m2, Cycle 1, Reason: Provider Judgment) Administration: 72 mg (10/22/2018), 72 mg (10/29/2018), 72 mg (11/19/2018), 72 mg (11/05/2018), 72 mg (11/12/2018), 72 mg (12/01/2018), 72 mg (12/10/2018), 72 mg (12/17/2018), 72 mg (12/24/2018), 72 mg (12/31/2018), 72 mg (01/07/2019), 72 mg (01/14/2019) trastuzumab-anns (KANJINTI) 450 mg in sodium chloride 0.9 % 250  mL chemo infusion, 462 mg (100 % of original dose 6 mg/kg), Intravenous,  Once, 6 of 9 cycles Dose modification: 6 mg/kg (original dose 6 mg/kg, Cycle 8, Reason: Provider Judgment, Comment: change to biosimilar) Administration: 450 mg (04/29/2019), 450 mg (05/20/2019), 450 mg (07/22/2019), 450 mg (08/15/2019), 450 mg (06/10/2019), 450 mg (07/01/2019)  for chemotherapy treatment.       CANCER STAGING: Cancer Staging No matching staging information was found for the patient.   INTERVAL HISTORY:  Ms. [id number].o. female presents today for follow-up.  She reports overall doing well.  She denies any significant fatigue.  She continues on Herceptin every 3 weeks, tolerating well.  She states her ulcerative colitis has improved.  However, she does still experience rectal bleeding.  She does have a follow-up scheduled with Dr. RCecille Rubinnext month.  She denies any changes in her breasts.  No new lumps, masses, nipple inversion or discharge.    REVIEW OF SYSTEMS:  Review of Systems  Constitutional: Negative.   HENT:  Negative.   Eyes: Negative.   Respiratory: Negative.   Cardiovascular: Negative.   Gastrointestinal: Positive for blood in stool and diarrhea.  Endocrine: Negative.   Genitourinary: Negative.    Musculoskeletal: Positive for arthralgias.  Skin: Negative.   Neurological: Negative.   Hematological: Negative.   Psychiatric/Behavioral: Negative.      PAST MEDICAL/SURGICAL HISTORY:  Past Medical History:  Diagnosis Date  . Adult idiopathic generalized osteoporosis   . Arthritis    "minor" (08/18/2018)  . Breast [place], [place] breast [place]   lumpectomy  .  Breast cancer, right breast ([place]) 2009   lumpectomy; mastectomy  . DVT (deep venous thrombosis) ([place]) "early 2000's"   RLE  . DVT (deep venous thrombosis) ([place])   . Hemorrhoids, external   . History of blood transfusion    "related to ulcerative colitis"  . Lower abdominal pain   . Occult blood in stools   .  Peripheral edema   . Seizures ([place])    one very slight seizure with the stroke; somewhere between 2005-2009  . Stroke [facility]) 2005-2009   "very light;" ; denies residual on 08/19/2018  . UC (ulcerative colitis) ([place]) dx'd 9562   Past Surgical History:  Procedure Laterality Date  . APPENDECTOMY    . BIOPSY  04/14/2019   Procedure: BIOPSY;  Surgeon: [name], MD;  Location: [facility] END[facility];  Service: Endoscopy;;  . bone density  12/11/10  . BREAST BIOPSY Left 1998; 2019 X 2  . BREAST BIOPSY Right 2009  . BREAST LUMPECTOMY Left 1998  . CATARACT EXTRACTION W/ INTRAOCULAR LENS  IMPLANT, BILATERAL Bilateral   . COLONOSCOPY  02/21/2011  . COLONOSCOPY  10/06/2008  . COLONOSCOPY  12/27/01  . COLONOSCOPY  05/09/05  . COLONOSCOPY N/A 07/02/2016   Procedure: COLONOSCOPY;  Surgeon: [name], MD;  Location: [facility] END[facility];  Service: Endoscopy;  Laterality: N/A;  1055  . [place]   "I was having rectal bleeding"  . FLEXIBLE SIGMOIDOSCOPY N/A 04/14/2019   Procedure: FLEXIBLE SIGMOIDOSCOPY;  Surgeon: [name], MD;  Location: [facility];  Service: Endoscopy;  Laterality: N/A;  1:00  . FRACTURE SURGERY    . MASTECTOMY Right 2009  . MASTECTOMY COMPLETE / SIMPLE Left 08/19/2018  . PORTACATH PLACEMENT Right 10/15/2018   Procedure: INSERTION PORT-A-CATH RIGHT SUBCLAVIN;  Surgeon: [name], MD;  Location: [facility] ORS;  Service: General;  Laterality: Right;  pt knows to arrive at 7:30  . SIMPLE MASTECTOMY WITH AXILLARY SENTINEL NODE BIOPSY Left 08/19/2018   Procedure: LEFT SIMPLE MASTECTOMY;  Surgeon: [name], MD;  Location: [address];  Service: General;  Laterality: Left;  . TONSILLECTOMY    . VENA CAVA FILTER PLACEMENT Right   . WRIST FRACTURE SURGERY Right      SOCIAL HISTORY:  Social History   Socioeconomic History  . Marital status: Widowed    Spouse name: Not on file  . Number of children: 0  . Years of education: Not on file  . Highest education  level: Not on file  Occupational History  . Occupation: Optometrist: [employer]  Tobacco Use  . Smoking status: Former Smoker    Packs/day: 2.00    Years: 40.00    Pack years: 80.00    Types: Cigarettes    Quit date: 05/05/2001    Years since quitting: 18.2  . Smokeless tobacco: Never Used  Substance and Sexual Activity  . Alcohol use: Yes    Comment: 08/18/2018 "couple drinks/year"  . Drug use: Never  . Sexual activity: Not Currently  Other Topics Concern  . Not on file  Social History Narrative  . Not on file   Social Determinants of Health   Financial Resource Strain:   . Difficulty of Paying Living Expenses: Not on file  Food Insecurity:   . Worried About Charity fundraiser in the Last Year: Not on file  . Ran Out of Food in the Last Year: Not on file  Transportation Needs:   . Lack of  Transportation (Medical): Not on file  . Lack of Transportation (Non-Medical): Not on file  Physical Activity:   . Days of Exercise per Week: Not on file  . Minutes of Exercise per Session: Not on file  Stress:   . Feeling of Stress : Not on file  Social Connections:   . Frequency of Communication with Friends and Family: Not on file  . Frequency of Social Gatherings with Friends and Family: Not on file  . Attends Religious Services: Not on file  . Active Member of Clubs or Organizations: Not on file  . Attends Archivist Meetings: Not on file  . Marital Status: Not on file  Intimate Partner Violence:   . Fear of Current or Ex-Partner: Not on file  . Emotionally Abused: Not on file  . Physically Abused: Not on file  . Sexually Abused: Not on file    FAMILY HISTORY:  Family History  Problem Relation Age of Onset  . Prostate cancer Father   . Colon cancer Neg Hx     CURRENT MEDICATIONS:  Outpatient Encounter Medications as of 08/15/2019  Medication Sig Note  . acetaminophen (TYLENOL) 500 MG tablet Take 500 mg by mouth. Patient  states that she takes 2 by mouth prior to treatment.   . Adalimumab (HUMIRA PEN) 40 MG/0.8ML PNKT Inject 40 mg as directed every 14 (fourteen) days. SATURDAYS.   Marland Kitchen Ascorbic Acid (VITAMIN C) 1000 MG tablet Take 1,000 mg by mouth daily.   . Calcium Carbonate-Vitamin D (CALCIUM 600+D) 600-400 MG-UNIT tablet Take 1 tablet by mouth daily.   Marland Kitchen denosumab (PROLIA) 60 MG/ML SOLN injection Inject 60 mg into the skin every 6 (six) months. Administer in upper arm, thigh, or abdomen 07/25/2019: Last dose 03/2019  . ELIQUIS 2.5 MG TABS tablet Take 1 tablet (2.5 mg total) by mouth 2 (two) times daily.   Marland Kitchen letrozole (FEMARA) 2.5 MG tablet Take 1 tablet (2.5 mg total) by mouth daily.   Marland Kitchen lidocaine-prilocaine (EMLA) cream Apply to port site 1 hour prior to appointment   . Magnesium 500 MG TABS Take 500 mg by mouth daily.   . Naphazoline-Pheniramine (OPCON-A) 0.027-0.315 % SOLN Place 1 drop into both eyes 3 (three) times daily as needed (dry eyes).  09/21/2018: As needed , rare per patient  . polyethylene glycol powder (GLYCOLAX/MIRALAX) powder Take 8.5 g by mouth daily as needed.   Marland Kitchen PRESCRIPTION MEDICATION Apply 1 application topically 2 (two) times daily as needed (eczema). Ketoconazole-fluticasone 1:1 compounded cream  08/03/2018: Compounded at [facility]  . Trastuzumab (HERCEPTIN IV) Inject into the vein every 21 ( twenty-one) days. Every 3 weeks x 1 year starting 10/22/2018   . Wheat Dextrin (BENEFIBER) CHEW Chew by mouth. Patient states that she started yesterday. She took 6.    Facility-Administered Encounter Medications as of 08/15/2019  Medication  . [COMPLETED] 0.9 %  sodium chloride infusion  . acetaminophen (TYLENOL) tablet 650 mg  . diphenhydrAMINE (BENADRYL) capsule 50 mg  . [COMPLETED] heparin lock flush 100 unit/mL  . sodium chloride flush (NS) 0.9 % injection 10 mL  . [COMPLETED] trastuzumab-anns (KANJINTI) 450 mg in sodium chloride 0.9 % 250 mL chemo infusion    ALLERGIES:  Allergies  Allergen  Reactions  . Mercaptopurine Other (See Comments)    Dropped WBC  . Sulfa Antibiotics Other (See Comments)    Extreme Weakness     PHYSICAL EXAM:  ECOG Performance status: 1  There were no vitals filed for this visit. There were no  vitals filed for this visit.  Physical Exam Constitutional:      Appearance: Normal appearance.  HENT:     Head: Normocephalic.     Right Ear: External ear normal.     Left Ear: External ear normal.     Nose: Nose normal.  Eyes:     Conjunctiva/sclera: Conjunctivae normal.  Cardiovascular:     Rate and Rhythm: Normal rate and regular rhythm.     Pulses: Normal pulses.     Heart sounds: Normal heart sounds.  Pulmonary:     Effort: Pulmonary effort is normal.     Breath sounds: Normal breath sounds.  Abdominal:     General: Bowel sounds are normal.  Musculoskeletal:     Cervical back: Normal range of motion.     Comments: Decreased range of motion  Skin:    General: Skin is warm.  Neurological:     General: No focal deficit present.     Mental Status: She is alert and oriented to person, place, and time.  Psychiatric:        Mood and Affect: Mood normal.      LABORATORY DATA:  I have reviewed the labs as listed.  CBC    Component Value Date/Time   WBC 6.9 08/15/2019 0802   RBC 4.11 08/15/2019 0802   HGB 12.6 08/15/2019 0802   HGB 14.4 03/10/2017 1254   HCT 39.3 08/15/2019 0802   HCT 41.5 03/10/2017 1254   PLT 215 08/15/2019 0802   PLT 249 03/10/2017 1254   MCV 95.6 08/15/2019 0802   MCV 91 03/10/2017 1254   MCH 30.7 08/15/2019 0802   MCHC 32.1 08/15/2019 0802   RDW 13.4 08/15/2019 0802   RDW 13.3 03/10/2017 1254   LYMPHSABS 3.3 08/15/2019 0802   LYMPHSABS 2.7 03/10/2017 1254   MONOABS 0.7 08/15/2019 0802   EOSABS 0.3 08/15/2019 0802   EOSABS 0.4 03/10/2017 1254   BASOSABS 0.0 08/15/2019 0802   BASOSABS 0.1 03/10/2017 1254   CMP Latest Ref Rng & Units 08/15/2019 07/22/2019 07/01/2019  Glucose 70 - 99 mg/dL 109(H) 103(H)  98  BUN 8 - 23 mg/dL 21 23 19   Creatinine 0.44 - 1.00 mg/dL 0.92 0.94 0.98  Sodium 135 - 145 mmol/L 138 139 137  Potassium 3.5 - 5.1 mmol/L 3.8 4.2 4.0  Chloride 98 - 111 mmol/L 102 107 103  CO2 22 - 32 mmol/L 26 26 26   Calcium 8.9 - 10.3 mg/dL 9.0 8.6(L) 8.7(L)  Total Protein 6.5 - 8.1 g/dL 7.4 6.7 7.1  Total Bilirubin 0.3 - 1.2 mg/dL 0.7 0.6 0.7  Alkaline Phos 38 - 126 U/L 31(L) 30(L) 31(L)  AST 15 - 41 U/L 21 18 18   ALT 0 - 44 U/L 18 17 17        DIAGNOSTIC IMAGING:  I have independently reviewed the scans and discussed with the patient.   I have reviewed Francene Finders, NP's note and agree with the documentation.  I personally performed a face-to-face visit, made revisions and my assessment and plan is as follows.    ASSESSMENT & PLAN:   No problem-specific Assessment & Plan notes found for this encounter.      Orders placed this encounter:  No orders of the defined types were placed in this encounter.     Derek Jack, MD [place] [phone]

## 2019-08-31 ENCOUNTER — Other Ambulatory Visit (HOSPITAL_COMMUNITY): Payer: Self-pay | Admitting: Hematology

## 2019-08-31 DIAGNOSIS — Z170 Estrogen receptor positive status [ER+]: Secondary | ICD-10-CM

## 2019-08-31 DIAGNOSIS — C50212 Malignant neoplasm of upper-inner quadrant of left female breast: Secondary | ICD-10-CM

## 2019-09-09 ENCOUNTER — Inpatient Hospital Stay (HOSPITAL_COMMUNITY): Payer: Medicare PPO | Attending: Nurse Practitioner

## 2019-09-09 ENCOUNTER — Other Ambulatory Visit: Payer: Self-pay

## 2019-09-09 ENCOUNTER — Inpatient Hospital Stay (HOSPITAL_COMMUNITY): Payer: Medicare PPO

## 2019-09-09 VITALS — BP 143/64 | HR 62 | Temp 96.9°F | Resp 18 | Wt 177.0 lb

## 2019-09-09 DIAGNOSIS — Z853 Personal history of malignant neoplasm of breast: Secondary | ICD-10-CM | POA: Diagnosis not present

## 2019-09-09 DIAGNOSIS — K921 Melena: Secondary | ICD-10-CM | POA: Insufficient documentation

## 2019-09-09 DIAGNOSIS — Z79899 Other long term (current) drug therapy: Secondary | ICD-10-CM | POA: Insufficient documentation

## 2019-09-09 DIAGNOSIS — Z87891 Personal history of nicotine dependence: Secondary | ICD-10-CM | POA: Diagnosis not present

## 2019-09-09 DIAGNOSIS — Z8042 Family history of malignant neoplasm of prostate: Secondary | ICD-10-CM | POA: Diagnosis not present

## 2019-09-09 DIAGNOSIS — Z9013 Acquired absence of bilateral breasts and nipples: Secondary | ICD-10-CM | POA: Diagnosis not present

## 2019-09-09 DIAGNOSIS — M2550 Pain in unspecified joint: Secondary | ICD-10-CM | POA: Insufficient documentation

## 2019-09-09 DIAGNOSIS — Z86718 Personal history of other venous thrombosis and embolism: Secondary | ICD-10-CM | POA: Insufficient documentation

## 2019-09-09 DIAGNOSIS — Z86010 Personal history of colonic polyps: Secondary | ICD-10-CM | POA: Insufficient documentation

## 2019-09-09 DIAGNOSIS — Z8673 Personal history of transient ischemic attack (TIA), and cerebral infarction without residual deficits: Secondary | ICD-10-CM | POA: Diagnosis not present

## 2019-09-09 DIAGNOSIS — C50212 Malignant neoplasm of upper-inner quadrant of left female breast: Secondary | ICD-10-CM

## 2019-09-09 LAB — COMPREHENSIVE METABOLIC PANEL
ALT: 18 U/L (ref 0–44)
AST: 21 U/L (ref 15–41)
Albumin: 3.7 g/dL (ref 3.5–5.0)
Alkaline Phosphatase: 34 U/L — ABNORMAL LOW (ref 38–126)
Anion gap: 9 (ref 5–15)
BUN: 19 mg/dL (ref 8–23)
CO2: 26 mmol/L (ref 22–32)
Calcium: 9.4 mg/dL (ref 8.9–10.3)
Chloride: 103 mmol/L (ref 98–111)
Creatinine, Ser: 0.96 mg/dL (ref 0.44–1.00)
GFR calc Af Amer: >60 mL/min (ref >60)
GFR calc non Af Amer: 55 mL/min — ABNORMAL LOW (ref >60)
Glucose, Bld: 97 mg/dL (ref 70–99)
Potassium: 3.9 mmol/L (ref 3.5–5.1)
Sodium: 138 mmol/L (ref 135–145)
Total Bilirubin: 0.6 mg/dL (ref 0.3–1.2)
Total Protein: 7.3 g/dL (ref 6.5–8.1)

## 2019-09-09 LAB — CBC WITH DIFFERENTIAL/PLATELET
Abs Immature Granulocytes: 0.01 10*3/uL (ref 0.00–0.07)
Basophils Absolute: 0 10*3/uL (ref 0.0–0.1)
Basophils Relative: 1 %
Eosinophils Absolute: 0.2 10*3/uL (ref 0.0–0.5)
Eosinophils Relative: 4 %
HCT: 39.9 % (ref 36.0–46.0)
Hemoglobin: 12.8 g/dL (ref 12.0–15.0)
Immature Granulocytes: 0 %
Lymphocytes Relative: 49 %
Lymphs Abs: 3.2 10*3/uL (ref 0.7–4.0)
MCH: 30.1 pg (ref 26.0–34.0)
MCHC: 32.1 g/dL (ref 30.0–36.0)
MCV: 93.9 fL (ref 80.0–100.0)
Monocytes Absolute: 0.6 10*3/uL (ref 0.1–1.0)
Monocytes Relative: 9 %
Neutro Abs: 2.4 10*3/uL (ref 1.7–7.7)
Neutrophils Relative %: 37 %
Platelets: 205 10*3/uL (ref 150–400)
RBC: 4.25 MIL/uL (ref 3.87–5.11)
RDW: 12.9 % (ref 11.5–15.5)
WBC: 6.5 10*3/uL (ref 4.0–10.5)
nRBC: 0 % (ref 0.0–0.2)

## 2019-09-09 MED ORDER — DIPHENHYDRAMINE HCL 25 MG PO CAPS: 50.0000 mg | ORAL_CAPSULE | Freq: Once | ORAL | Status: DC

## 2019-09-09 MED ORDER — TRASTUZUMAB-ANNS CHEMO 150 MG IV SOLR
450.0000 mg | Freq: Once | INTRAVENOUS | Status: AC
  Administered 2019-09-09: 11:00:00 450 mg via INTRAVENOUS
  Filled 2019-09-09: qty 21.43

## 2019-09-09 MED ORDER — SODIUM CHLORIDE 0.9 % IV SOLN: Freq: Once | INTRAVENOUS | Status: AC

## 2019-09-09 MED ORDER — SODIUM CHLORIDE 0.9% FLUSH
10.0000 mL | INTRAVENOUS | Status: DC | PRN
  Administered 2019-09-09: 12:00:00 10 mL

## 2019-09-09 MED ORDER — ACETAMINOPHEN 325 MG PO TABS: 650.0000 mg | ORAL_TABLET | Freq: Once | ORAL | Status: DC

## 2019-09-09 MED ORDER — HEPARIN SOD (PORK) LOCK FLUSH 100 UNIT/ML IV SOLN
500.0000 [IU] | Freq: Once | INTRAVENOUS | Status: AC | PRN
  Administered 2019-09-09: 500 [IU]

## 2019-09-09 NOTE — Progress Notes (Signed)
Patient presents today for treatment. Vital signs within parameters for treatment. MAR reviewed and updated. Labs drawn today. Patient has no complaints of any changes since her last visit. New consent obtained today. Last consent on 10/07/18.   Treatment given today per MD orders. Tolerated infusion without adverse affects. Vital signs stable. No complaints at this time. Discharged from clinic ambulatory. F/U with [facility] as scheduled.

## 2019-09-09 NOTE — Patient Instructions (Signed)
[facility] Discharge Instructions for Patients Receiving Chemotherapy  Today you received the following chemotherapy agents   To help prevent nausea and vomiting after your treatment, we encourage you to take your nausea medication   If you develop nausea and vomiting that is not controlled by your nausea medication, call the clinic.   BELOW ARE SYMPTOMS THAT SHOULD BE REPORTED IMMEDIATELY:  *FEVER GREATER THAN 100.5 F  *CHILLS WITH OR WITHOUT FEVER  NAUSEA AND VOMITING THAT IS NOT CONTROLLED WITH YOUR NAUSEA MEDICATION  *UNUSUAL SHORTNESS OF BREATH  *UNUSUAL BRUISING OR BLEEDING  TENDERNESS IN MOUTH AND THROAT WITH OR WITHOUT PRESENCE OF ULCERS  *URINARY PROBLEMS  *BOWEL PROBLEMS  UNUSUAL RASH Items with * indicate a potential emergency and should be followed up as soon as possible.  Feel free to call the clinic should you have any questions or concerns. The clinic phone number is [phone].  Please show the CHEMO ALERT CARD at check-in to the Emergency Department and triage nurse.   

## 2019-09-21 ENCOUNTER — Encounter (INDEPENDENT_AMBULATORY_CARE_PROVIDER_SITE_OTHER): Payer: Self-pay

## 2019-09-27 ENCOUNTER — Ambulatory Visit (INDEPENDENT_AMBULATORY_CARE_PROVIDER_SITE_OTHER): Payer: Medicare Other | Admitting: Internal Medicine

## 2019-09-27 ENCOUNTER — Encounter (INDEPENDENT_AMBULATORY_CARE_PROVIDER_SITE_OTHER): Payer: Self-pay | Admitting: Internal Medicine

## 2019-09-27 ENCOUNTER — Other Ambulatory Visit: Payer: Self-pay

## 2019-09-27 VITALS — BP 149/80 | HR 73 | Temp 97.1°F | Ht 62.5 in | Wt 177.1 lb

## 2019-09-27 DIAGNOSIS — K5100 Ulcerative (chronic) pancolitis without complications: Secondary | ICD-10-CM

## 2019-09-27 NOTE — Patient Instructions (Signed)
Notify if you have rectal bleeding. Consider pelvic exam to check for rectocele if having to strain at defecation persists.

## 2019-09-27 NOTE — Progress Notes (Signed)
Presenting complaint;  Follow-up for ulcerative colitis.  Database and subjective:  Patient is 84 year old Caucasian female who has more than 50-year history of ulcerative colitis.  She has pancolonic disease.  She has done well on Humira/adalimumab.  She began to have rectal bleeding about 6 months ago.  She underwent flexible sigmoidoscopy in August 2020 revealing active disease in the rectum.  She had partial response with hydrocortisone enemas for 2 weeks.  She was not able to tolerate mesalamine suppositories.  She was doing better on her last visit 2 months ago but she was still passing some blood per rectum.  She says she is doing much better.  She has not passed blood per rectum in the last 1 month.  She is also not passing any mucus.  She denies abdominal pain.  She is using polyethylene glycol.  She is having to strain at times.  She has noted decrease in the caliber of her stools.  She has not had a pelvic exam in a long time.  Her appetite is good and her weight has been stable. She states she would stop her Herceptin after 2 more doses.   Current Medications: Outpatient Encounter Medications as of 09/27/2019  Medication Sig  . acetaminophen (TYLENOL) 500 MG tablet Take 500 mg by mouth. Patient states that she takes 2 by mouth prior to treatment.  . Adalimumab (HUMIRA PEN) 40 MG/0.8ML PNKT Inject 40 mg as directed every 14 (fourteen) days. SATURDAYS.  Marland Kitchen Ascorbic Acid (VITAMIN C) 1000 MG tablet Take 1,000 mg by mouth daily.  . Calcium Carbonate-Vitamin D (CALCIUM 600+D) 600-400 MG-UNIT tablet Take 1 tablet by mouth daily.  Marland Kitchen denosumab (PROLIA) 60 MG/ML SOLN injection Inject 60 mg into the skin every 6 (six) months. Administer in upper arm, thigh, or abdomen  . ELIQUIS 2.5 MG TABS tablet Take 1 tablet (2.5 mg total) by mouth 2 (two) times daily.  Marland Kitchen letrozole (FEMARA) 2.5 MG tablet TAKE 1 TABLET ONCE DAILY.  Marland Kitchen lidocaine-prilocaine (EMLA) cream Apply to port site 1 hour prior to  appointment  . Magnesium 500 MG TABS Take 500 mg by mouth daily.  . Naphazoline-Pheniramine (OPCON-A) 0.027-0.315 % SOLN Place 1 drop into both eyes 3 (three) times daily as needed (dry eyes).   . polyethylene glycol powder (GLYCOLAX/MIRALAX) powder Take 8.5 g by mouth daily as needed.  Marland Kitchen PRESCRIPTION MEDICATION Apply 1 application topically 2 (two) times daily as needed (eczema). Ketoconazole-fluticasone 1:1 compounded cream   . Trastuzumab (HERCEPTIN IV) Inject into the vein every 21 ( twenty-one) days. Every 3 weeks x 1 year starting 10/22/2018  . Wheat Dextrin (BENEFIBER) CHEW Chew by mouth. Patient states that she started yesterday. She took 6.   No facility-administered encounter medications on file as of 09/27/2019.     Objective: Blood pressure (!) 149/80, pulse 73, temperature (!) 97.1 F (36.2 C), temperature source Temporal, height 5' 2.5" (1.588 m), weight 177 lb 1.6 oz (80.3 kg). Patient is alert and in no acute distress. She is wearing facial mask. Conjunctiva is pink. Sclera is nonicteric Oropharyngeal mucosa is normal. No neck masses or thyromegaly noted. Cardiac exam with regular rhythm normal S1 and S2. No murmur or gallop noted. Lungs are clear to auscultation. Abdomen is symmetrical.  She has left lower paramedial scar.  On palpation abdomen is soft and nontender with organomegaly or masses. No LE edema or clubbing noted.  Labs/studies Results:  CBC Latest Ref Rng & Units 09/09/2019 08/15/2019 07/22/2019  WBC 4.0 - 10.5 K/uL  6.5 6.9 6.8  Hemoglobin 12.0 - 15.0 g/dL 12.8 12.6 11.6(L)  Hematocrit 36.0 - 46.0 % 39.9 39.3 36.6  Platelets 150 - 400 K/uL 205 215 224    CMP Latest Ref Rng & Units 09/09/2019 08/15/2019 07/22/2019  Glucose 70 - 99 mg/dL 97 109(H) 103(H)  BUN 8 - 23 mg/dL _0 Creatinine 0.44 - 1.00 mg/dL 0.96 0.92 0.94  Sodium 135 - 145 mmol/L 138 138 139  Potassium 3.5 - 5.1 mmol/L 3.9 3.8 4.2  Chloride 98 - 111 mmol/L 103 102 107  CO2 22 - 32 mmol/L  _1 Calcium 8.9 - 10.3 mg/dL 9.4 9.0 8.6(L)  Total Protein 6.5 - 8.1 g/dL 7.3 7.4 6.7  Total Bilirubin 0.3 - 1.2 mg/dL 0.6 0.7 0.6  Alkaline Phos 38 - 126 U/L 34(L) 31(L) 30(L)  AST 15 - 41 U/L _2 ALT 0 - 44 U/L _3 Hepatic Function Latest Ref Rng & Units 09/09/2019 08/15/2019 07/22/2019  Total Protein 6.5 - 8.1 g/dL 7.3 7.4 6.7  Albumin 3.5 - 5.0 g/dL 3.7 3.6 3.3(L)  AST 15 - 41 U/L _4 ALT 0 - 44 U/L _5 Alk Phosphatase 38 - 126 U/L 34(L) 31(L) 30(L)  Total Bilirubin 0.3 - 1.2 mg/dL 0.6 0.7 0.6    Lab Results  Component Value Date   CRP <0.5 06/19/2014      Assessment:  #1.  Chronic pan ulcerative colitis.  She has been maintained on Humira/adalimumab.  She was documented to have relapse of her disease limited to rectum requiring topical therapy with steroid.  She did not tolerate topical mesalamine.  She has not passed any blood per rectum in 1 month.  It appears she is back in remission.  I am still not sure what triggered relapse.  #2.  History of constipation.  She is using polyethylene glycol on as-needed basis.  At times she has to strain even though stool is not hard.  Therefore she needs to be checked for rectocele.   Plan:  Patient will continue Humira/adalimumab 40 mg SQ every 14 days. Consider pelvic exam by PCP to determine whether or not she have rectocele. Office visit in 6 months.

## 2019-09-30 ENCOUNTER — Inpatient Hospital Stay (HOSPITAL_COMMUNITY): Payer: Medicare PPO | Attending: Hematology

## 2019-09-30 ENCOUNTER — Other Ambulatory Visit: Payer: Self-pay

## 2019-09-30 ENCOUNTER — Inpatient Hospital Stay (HOSPITAL_COMMUNITY): Payer: Medicare PPO | Admitting: Nurse Practitioner

## 2019-09-30 ENCOUNTER — Inpatient Hospital Stay (HOSPITAL_COMMUNITY): Payer: Medicare PPO

## 2019-09-30 VITALS — BP 150/59 | HR 63 | Temp 97.1°F | Resp 18

## 2019-09-30 DIAGNOSIS — Z170 Estrogen receptor positive status [ER+]: Secondary | ICD-10-CM | POA: Diagnosis not present

## 2019-09-30 DIAGNOSIS — C50212 Malignant neoplasm of upper-inner quadrant of left female breast: Secondary | ICD-10-CM

## 2019-09-30 DIAGNOSIS — Z79899 Other long term (current) drug therapy: Secondary | ICD-10-CM | POA: Insufficient documentation

## 2019-09-30 DIAGNOSIS — Z8042 Family history of malignant neoplasm of prostate: Secondary | ICD-10-CM | POA: Insufficient documentation

## 2019-09-30 DIAGNOSIS — M8580 Other specified disorders of bone density and structure, unspecified site: Secondary | ICD-10-CM | POA: Diagnosis not present

## 2019-09-30 DIAGNOSIS — Z7901 Long term (current) use of anticoagulants: Secondary | ICD-10-CM | POA: Diagnosis not present

## 2019-09-30 DIAGNOSIS — Z86718 Personal history of other venous thrombosis and embolism: Secondary | ICD-10-CM | POA: Insufficient documentation

## 2019-09-30 DIAGNOSIS — Z8719 Personal history of other diseases of the digestive system: Secondary | ICD-10-CM | POA: Insufficient documentation

## 2019-09-30 DIAGNOSIS — Z5112 Encounter for antineoplastic immunotherapy: Secondary | ICD-10-CM | POA: Diagnosis present

## 2019-09-30 DIAGNOSIS — Z87891 Personal history of nicotine dependence: Secondary | ICD-10-CM | POA: Diagnosis not present

## 2019-09-30 DIAGNOSIS — K5190 Ulcerative colitis, unspecified, without complications: Secondary | ICD-10-CM | POA: Diagnosis not present

## 2019-09-30 DIAGNOSIS — Z882 Allergy status to sulfonamides status: Secondary | ICD-10-CM | POA: Insufficient documentation

## 2019-09-30 DIAGNOSIS — M1990 Unspecified osteoarthritis, unspecified site: Secondary | ICD-10-CM | POA: Diagnosis not present

## 2019-09-30 LAB — COMPREHENSIVE METABOLIC PANEL
ALT: 19 U/L (ref 0–44)
AST: 19 U/L (ref 15–41)
Albumin: 3.6 g/dL (ref 3.5–5.0)
Alkaline Phosphatase: 38 U/L (ref 38–126)
Anion gap: 6 (ref 5–15)
BUN: 21 mg/dL (ref 8–23)
CO2: 26 mmol/L (ref 22–32)
Calcium: 8.8 mg/dL — ABNORMAL LOW (ref 8.9–10.3)
Chloride: 105 mmol/L (ref 98–111)
Creatinine, Ser: 0.99 mg/dL (ref 0.44–1.00)
GFR calc Af Amer: >60 mL/min (ref >60)
GFR calc non Af Amer: 53 mL/min — ABNORMAL LOW (ref >60)
Glucose, Bld: 116 mg/dL — ABNORMAL HIGH (ref 70–99)
Potassium: 3.9 mmol/L (ref 3.5–5.1)
Sodium: 137 mmol/L (ref 135–145)
Total Bilirubin: 0.6 mg/dL (ref 0.3–1.2)
Total Protein: 7.2 g/dL (ref 6.5–8.1)

## 2019-09-30 LAB — CBC WITH DIFFERENTIAL/PLATELET
Abs Immature Granulocytes: 0.01 10*3/uL (ref 0.00–0.07)
Basophils Absolute: 0 10*3/uL (ref 0.0–0.1)
Basophils Relative: 1 %
Eosinophils Absolute: 0.2 10*3/uL (ref 0.0–0.5)
Eosinophils Relative: 4 %
HCT: 38.6 % (ref 36.0–46.0)
Hemoglobin: 12.6 g/dL (ref 12.0–15.0)
Immature Granulocytes: 0 %
Lymphocytes Relative: 45 %
Lymphs Abs: 2.7 10*3/uL (ref 0.7–4.0)
MCH: 31.1 pg (ref 26.0–34.0)
MCHC: 32.6 g/dL (ref 30.0–36.0)
MCV: 95.3 fL (ref 80.0–100.0)
Monocytes Absolute: 0.6 10*3/uL (ref 0.1–1.0)
Monocytes Relative: 10 %
Neutro Abs: 2.4 10*3/uL (ref 1.7–7.7)
Neutrophils Relative %: 40 %
Platelets: 208 10*3/uL (ref 150–400)
RBC: 4.05 MIL/uL (ref 3.87–5.11)
RDW: 13 % (ref 11.5–15.5)
WBC: 5.9 10*3/uL (ref 4.0–10.5)
nRBC: 0 % (ref 0.0–0.2)

## 2019-09-30 MED ORDER — SODIUM CHLORIDE 0.9 % IV SOLN: Freq: Once | INTRAVENOUS | Status: AC

## 2019-09-30 MED ORDER — SODIUM CHLORIDE 0.9% FLUSH
10.0000 mL | INTRAVENOUS | Status: DC | PRN
  Administered 2019-09-30: 10 mL

## 2019-09-30 MED ORDER — DIPHENHYDRAMINE HCL 25 MG PO CAPS: 50.0000 mg | ORAL_CAPSULE | Freq: Once | ORAL | Status: DC

## 2019-09-30 MED ORDER — ACETAMINOPHEN 325 MG PO TABS: 650.0000 mg | ORAL_TABLET | Freq: Once | ORAL | Status: DC

## 2019-09-30 MED ORDER — TRASTUZUMAB-ANNS CHEMO 150 MG IV SOLR
450.0000 mg | Freq: Once | INTRAVENOUS | Status: AC
  Administered 2019-09-30: 450 mg via INTRAVENOUS
  Filled 2019-09-30: qty 21.43

## 2019-09-30 MED ORDER — HEPARIN SOD (PORK) LOCK FLUSH 100 UNIT/ML IV SOLN
500.0000 [IU] | Freq: Once | INTRAVENOUS | Status: AC | PRN
  Administered 2019-09-30: 500 [IU]

## 2019-09-30 NOTE — Patient Instructions (Signed)
[place] at [facility] Discharge Instructions  Follow up in 3 weeks for last treatment with labs    Thank you for choosing [facility] at [facility] to provide your oncology and hematology care.  To afford each patient quality time with our provider, please arrive at least 15 minutes before your scheduled appointment time.   If you have a lab appointment with the [facility] please come in thru the Main Entrance and check in at the main information desk.  You need to re-schedule your appointment should you arrive 10 or more minutes late.  We strive to give you quality time with our providers, and arriving late affects you and other patients whose appointments are after yours.  Also, if you no show three or more times for appointments you may be dismissed from the clinic at the providers discretion.     Again, thank you for choosing [facility].  Our hope is that these requests will decrease the amount of time that you wait before being seen by our physicians.       _____________________________________________________________  Should you have questions after your visit to [facility], please contact our office at [phone] between the hours of 8:00 a.m. and 4:30 p.m.  Voicemails left after 4:00 p.m. will not be returned until the following business day.  For prescription refill requests, have your pharmacy contact our office and allow 72 hours.    Due to Covid, you will need to wear a mask upon entering the hospital. If you do not have a mask, a mask will be given to you at the Main Entrance upon arrival. For doctor visits, patients may have 1 support person with them. For treatment visits, patients can not have anyone with them due to social distancing guidelines and our immunocompromised population.

## 2019-09-30 NOTE — Progress Notes (Signed)
Patient tolerated therapy with no complaints voiced.  Port site clean and dry with no bruising or swelling noted at site.  Good blood return noted before and after administration of therapy.  Band aid applied.  Patient left ambulatory with VSS and no s/s of distress noted.

## 2019-09-30 NOTE — Assessment & Plan Note (Addendum)
1.  Stage I HER-2 positive left breast cancer: -Left mastectomy on 08/19/2018, 1.1 cm IDC, grade 1, margins negative.  No lymph nodes were identified.  ER positive/PR positive/HER 2 3+. -12 doses of weekly paclitaxel and Herceptin from 10/22/2018 through 01/14/2019. -Herceptin every 3 weeks started on 02/03/2019. -Anastrozole started in December 2019, made her confused and forgetful.  Switched to Femara on 02/03/2019, which she is tolerating very well without any musculoskeletal symptoms or hot flashes. -Last echocardiogram on 08/08/2019 showed a EF of 65 to 70% -Labs done on 09/30/2019 were all within normal limits. -She may proceed with her Herceptin today. -She will return to clinic in 6 weeks. -After she is completed therapy she will be set up with a survivorship appointment.  2.  Osteopenia: -Last DEXA scan on 02/08/2018 showed a T score of -1.2. -She is continuing Prolia every 6 months with Dr. Melina Copa. -She will continue calcium and vitamin D supplements daily.  3.  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 -She denies any bleeding episodes.  4.  Ulcerative colitis: -She is on Humira for the past few years.  She is also taking oral mesalamine. -She is following up with Dr. Laural Golden

## 2019-09-30 NOTE — Progress Notes (Signed)
[place], [place] [address]   CLINIC:  Medical Oncology/Hematology  PCP:  [name], NP [address] [place] [address] [phone]   REASON FOR VISIT: Follow-up for breast cancer  CURRENT THERAPY: Herceptin every 3 weeks  BRIEF ONCOLOGIC HISTORY:  Oncology History  Breast cancer of upper-inner quadrant of left female breast ([place])  07/21/2018 Initial Diagnosis   Breast cancer of upper-inner quadrant of left female breast ([place])   10/22/2018 -  Chemotherapy   The patient had trastuzumab (HERCEPTIN) 300 mg in sodium chloride 0.9 % 250 mL chemo infusion, 315 mg, Intravenous,  Once, 7 of 7 cycles Dose modification: 0.06 mg/kg (1 % of original dose 6 mg/kg, Cycle 6, Reason: Other (see comments), Comment: need to resign), 6 mg/kg (original dose 6 mg/kg, Cycle 6, Reason: Other (see comments), Comment: need to reenter to sign ), 2 mg/kg (original dose 6 mg/kg, Cycle 7, Reason: Other (see comments)), 6 mg/kg (original dose 6 mg/kg, Cycle 7, Reason: Other (see comments)) Administration: 300 mg (10/22/2018), 150 mg (10/29/2018), 150 mg (11/19/2018), 450 mg (02/03/2019), 450 mg (02/24/2019), 150 mg (11/05/2018), 150 mg (11/12/2018), 150 mg (12/01/2018), 150 mg (12/10/2018), 150 mg (12/17/2018), 150 mg (12/24/2018), 150 mg (12/31/2018), 150 mg (01/07/2019), 150 mg (01/14/2019), 450 mg (03/18/2019), 450 mg (04/08/2019) PACLitaxel (TAXOL) 72 mg in sodium chloride 0.9 % 150 mL chemo infusion (</= 100m/m2), 40 mg/m2 = 72 mg (50 % of original dose 80 mg/m2), Intravenous,  Once, 3 of 3 cycles Dose modification: 40 mg/m2 (50 % of original dose 80 mg/m2, Cycle 1, Reason: Provider Judgment) Administration: 72 mg (10/22/2018), 72 mg (10/29/2018), 72 mg (11/19/2018), 72 mg (11/05/2018), 72 mg (11/12/2018), 72 mg (12/01/2018), 72 mg (12/10/2018), 72 mg (12/17/2018), 72 mg (12/24/2018), 72 mg (12/31/2018), 72 mg (01/07/2019), 72 mg (01/14/2019) trastuzumab-anns (KANJINTI) 450 mg in sodium chloride  0.9 % 250 mL chemo infusion, 462 mg (100 % of original dose 6 mg/kg), Intravenous,  Once, 8 of 9 cycles Dose modification: 6 mg/kg (original dose 6 mg/kg, Cycle 8, Reason: Provider Judgment, Comment: change to biosimilar) Administration: 450 mg (04/29/2019), 450 mg (05/20/2019), 450 mg (07/22/2019), 450 mg (08/15/2019), 450 mg (06/10/2019), 450 mg (07/01/2019), 450 mg (09/09/2019)  for chemotherapy treatment.       INTERVAL HISTORY:  Ms. WAlvarenga89y.o. female returns for routine follow-up for breast cancer.  Patient reports she has been doing well with treatments.  She has no complaints or no problems.  She is still taking her Femara daily without any problems.  She denies any hot flashes or musculoskeletal problems. Denies any nausea, vomiting, or diarrhea. Denies any new pains. Had not noticed any recent bleeding such as epistaxis, hematuria or hematochezia. Denies recent chest pain on exertion, shortness of breath on minimal exertion, pre-syncopal episodes, or palpitations. Denies any numbness or tingling in hands or feet. Denies any recent fevers, infections, or recent hospitalizations. Patient reports appetite at 100% and energy level at 100%.  She is eating well maintain her weight at this time.     REVIEW OF SYSTEMS:  Review of Systems  All other systems reviewed and are negative.    PAST MEDICAL/SURGICAL HISTORY:  Past Medical History:  Diagnosis Date   Adult idiopathic generalized osteoporosis    Arthritis    "minor" (08/18/2018)   Breast cancer, left breast ([place] 1998   lumpectomy   Breast cancer, right breast ([facility] 2009   lumpectomy; mastectomy   DVT (deep  venous thrombosis) ([place]) "early 2000's"   RLE   DVT (deep venous thrombosis) ([place])    Hemorrhoids, external    History of blood transfusion    "related to ulcerative colitis"   Lower abdominal pain    Occult blood in stools    Peripheral edema    Seizures ([place])    one very slight seizure with the stroke;  somewhere between 2005-2009   Stroke [facility]) 2005-2009   "very light;" ; denies residual on 08/19/2018   UC (ulcerative colitis) ([place]) dx'd 1448   Past Surgical History:  Procedure Laterality Date   APPENDECTOMY     BIOPSY  04/14/2019   Procedure: BIOPSY;  Surgeon: [name], MD;  Location: [facility] END[facility] SU[facility];  Service: Endoscopy;;   bone density  12/11/10   BREAST BIOPSY Left 1998; 2019 X 2   BREAST BIOPSY Right 2009   BREAST LUMPECTOMY Left 1998   CATARACT EXTRACTION W/ INTRAOCULAR LENS  IMPLANT, BILATERAL Bilateral    COLONOSCOPY  02/21/2011   COLONOSCOPY  10/06/2008   COLONOSCOPY  12/27/01   COLONOSCOPY  05/09/05   COLONOSCOPY N/A 07/02/2016   Procedure: COLONOSCOPY;  Surgeon: [name], MD;  Location: [facility] ENDO SUITE;  Service: Endoscopy;  Laterality: N/A;  [place]   "I was having rectal bleeding"   FLEXIBLE SIGMOIDOSCOPY N/A 04/14/2019   Procedure: FLEXIBLE SIGMOIDOSCOPY;  Surgeon: [name], MD;  Location: [facility] ENDO SU[facility];  Service: Endoscopy;  Laterality: N/A;  1:00   FRACTURE SURGERY     MASTECTOMY Right 2009   MASTECTOMY COMPLETE / SIMPLE Left 08/19/2018   PORTACATH PLACEMENT Right 10/15/2018   Procedure: INSERTION PORT-A-CATH RIGHT SUBCLAVIN;  Surgeon: [name], MD;  Location: [facility] ORS;  Service: General;  Laterality: Right;  pt knows to arrive at 7:30   Spray Left 08/19/2018   Procedure: LEFT SIMPLE MASTECTOMY;  Surgeon: [name], MD;  Location: [facility];  Service: General;  Laterality: Left;   TONSILLECTOMY     VENA CAVA FILTER PLACEMENT Right    WRIST FRACTURE SURGERY Right      SOCIAL HISTORY:  Social History   Socioeconomic History   Marital status: Widowed    Spouse name: Not on file   Number of children: 0   Years of education: Not on file   Highest education level: Not on file  Occupational History   Occupation: Psychologist, prison and probation services     Employer: [employer]  Tobacco Use   Smoking status: Former Smoker    Packs/day: 2.00    Years: 40.00    Pack years: 80.00    Types: Cigarettes    Quit date: 05/05/2001    Years since quitting: 18.4   Smokeless tobacco: Never Used  Substance and Sexual Activity   Alcohol use: Yes    Comment: 08/18/2018 "couple drinks/year"   Drug use: Never   Sexual activity: Not Currently  Other Topics Concern   Not on file  Social History Narrative   Not on file   Social Determinants of Health   Financial Resource Strain:    Difficulty of Paying Living Expenses: Not on file  Food Insecurity:    Worried About Charity fundraiser in the Last Year: Not on file   [employer] of Food in the Last Year: Not on file  Transportation Needs:    Lack of Transportation (Medical): Not on file   Lack of Transportation (Non-Medical): Not on file  Physical Activity:    Days of Exercise per Week: Not on file   Minutes of Exercise per Session: Not on file  Stress:    Feeling of Stress : Not on file  Social Connections:    Frequency of Communication with Friends and Family: Not on file   Frequency of Social Gatherings with Friends and Family: Not on file   Attends Religious Services: Not on file   Active Member of Clubs or Organizations: Not on file   Attends Archivist Meetings: Not on file   Marital Status: Not on file  Intimate Partner Violence:    Fear of Current or Ex-Partner: Not on file   Emotionally Abused: Not on file   Physically Abused: Not on file   Sexually Abused: Not on file    FAMILY HISTORY:  Family History  Problem Relation Age of Onset   Prostate cancer Father    Colon cancer Neg Hx     CURRENT MEDICATIONS:  Outpatient Encounter Medications as of 09/30/2019  Medication Sig Note   Adalimumab (HUMIRA PEN) 40 MG/0.8ML PNKT Inject 40 mg as directed every 14 (fourteen) days. SATURDAYS.    Ascorbic Acid (VITAMIN C) 1000 MG tablet  Take 1,000 mg by mouth daily.    Calcium Carbonate-Vitamin D (CALCIUM 600+D) 600-400 MG-UNIT tablet Take 1 tablet by mouth daily.    denosumab (PROLIA) 60 MG/ML SOLN injection Inject 60 mg into the skin every 6 (six) months. Administer in upper arm, thigh, or abdomen 07/25/2019: Last dose 03/2019   ELIQUIS 2.5 MG TABS tablet Take 1 tablet (2.5 mg total) by mouth 2 (two) times daily.    letrozole (FEMARA) 2.5 MG tablet TAKE 1 TABLET ONCE DAILY.    lidocaine-prilocaine (EMLA) cream Apply to port site 1 hour prior to appointment    Magnesium 500 MG TABS Take 500 mg by mouth daily.    PRESCRIPTION MEDICATION Apply 1 application topically 2 (two) times daily as needed (eczema). Ketoconazole-fluticasone 1:1 compounded cream  08/03/2018: Compounded at [facility]   Trastuzumab (HERCEPTIN IV) Inject into the vein every 21 ( twenty-one) days. Every 3 weeks x 1 year starting 10/22/2018    Wheat Dextrin (BENEFIBER) CHEW Chew by mouth. Patient states that she started yesterday. She took 6.    acetaminophen (TYLENOL) 500 MG tablet Take 500 mg by mouth. Patient states that she takes 2 by mouth prior to treatment.    Naphazoline-Pheniramine (OPCON-A) 0.027-0.315 % SOLN Place 1 drop into both eyes 3 (three) times daily as needed (dry eyes).  09/21/2018: As needed , rare per patient   polyethylene glycol powder (GLYCOLAX/MIRALAX) powder Take 8.5 g by mouth daily as needed. (Patient not taking: Reported on 09/30/2019)    No facility-administered encounter medications on file as of 09/30/2019.    ALLERGIES:  Allergies  Allergen Reactions   Mercaptopurine Other (See Comments)    Dropped WBC   Sulfa Antibiotics Other (See Comments)    Extreme Weakness     PHYSICAL EXAM:  ECOG Performance status: 1  Vitals:   09/30/19 0808  BP: (!) 144/61  Pulse: 75  Resp: 18  Temp: (!) 97.1 F (36.2 C)  SpO2: 98%   Filed Weights   09/30/19 0808  Weight: 180 lb (81.6 kg)    Physical Exam Constitutional:       Appearance: Normal appearance. She is normal weight.  Cardiovascular:     Rate and Rhythm: Normal rate and regular rhythm.     Heart sounds: Normal heart sounds.  Pulmonary:     Effort: Pulmonary effort is normal.     Breath sounds: Normal breath sounds.  Abdominal:     General: Bowel sounds are normal.     Palpations: Abdomen is soft.  Musculoskeletal:        General: Normal range of motion.  Skin:    General: Skin is warm.  Neurological:     Mental Status: She is alert and oriented to person, place, and time. Mental status is at baseline.  Psychiatric:        Mood and Affect: Mood normal.        Behavior: Behavior normal.        Thought Content: Thought content normal.        Judgment: Judgment normal.      LABORATORY DATA:  I have reviewed the labs as listed.  CBC    Component Value Date/Time   WBC 5.9 09/30/2019 0815   RBC 4.05 09/30/2019 0815   HGB 12.6 09/30/2019 0815   HGB 14.4 03/10/2017 1254   HCT 38.6 09/30/2019 0815   HCT 41.5 03/10/2017 1254   PLT 208 09/30/2019 0815   PLT 249 03/10/2017 1254   MCV 95.3 09/30/2019 0815   MCV 91 03/10/2017 1254   MCH 31.1 09/30/2019 0815   MCHC 32.6 09/30/2019 0815   RDW 13.0 09/30/2019 0815   RDW 13.3 03/10/2017 1254   LYMPHSABS 2.7 09/30/2019 0815   LYMPHSABS 2.7 03/10/2017 1254   MONOABS 0.6 09/30/2019 0815   EOSABS 0.2 09/30/2019 0815   EOSABS 0.4 03/10/2017 1254   BASOSABS 0.0 09/30/2019 0815   BASOSABS 0.1 03/10/2017 1254   CMP Latest Ref Rng & Units 09/30/2019 09/09/2019 08/15/2019  Glucose 70 - 99 mg/dL 116(H) 97 109(H)  BUN 8 - 23 mg/dL 21 19 21   Creatinine 0.44 - 1.00 mg/dL 0.99 0.96 0.92  Sodium 135 - 145 mmol/L 137 138 138  Potassium 3.5 - 5.1 mmol/L 3.9 3.9 3.8  Chloride 98 - 111 mmol/L 105 103 102  CO2 22 - 32 mmol/L 26 26 26   Calcium 8.9 - 10.3 mg/dL 8.8(L) 9.4 9.0  Total Protein 6.5 - 8.1 g/dL 7.2 7.3 7.4  Total Bilirubin 0.3 - 1.2 mg/dL 0.6 0.6 0.7  Alkaline Phos 38 - 126 U/L 38 34(L) 31(L)    AST 15 - 41 U/L 19 21 21   ALT 0 - 44 U/L 19 18 18      I personally performed a face-to-face visit.  All questions were answered to patient's stated satisfaction. Encouraged patient to call with any new concerns or questions before his next visit to the cancer center and we can certain see him sooner, if needed.     ASSESSMENT & PLAN:   Breast cancer of upper-inner quadrant of left female breast ([place]) 1.  Stage I HER-2 positive left breast cancer: -Left mastectomy on 08/19/2018, 1.1 cm IDC, grade 1, margins negative.  No lymph nodes were identified.  ER positive/PR positive/HER 2 3+. -12 doses of weekly paclitaxel and Herceptin from 10/22/2018 through 01/14/2019. -Herceptin every 3 weeks started on 02/03/2019. -Anastrozole started in December 2019, made her confused and forgetful.  Switched to Femara on 02/03/2019, which she is tolerating very well without any musculoskeletal symptoms or hot flashes. -Last echocardiogram on 08/08/2019 showed a EF of 65 to 70% -Labs done on 09/30/2019 were all within normal limits. -She may proceed with her Herceptin today. -She will return to clinic in 6 weeks. -After she is completed therapy she will be set  up with a survivorship appointment.  2.  Osteopenia: -Last DEXA scan on 02/08/2018 showed a T score of -1.2. -She is continuing Prolia every 6 months with Dr. Melina Copa. -She will continue calcium and vitamin D supplements daily.  3.  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 -She denies any bleeding episodes.  4.  Ulcerative colitis: -She is on Humira for the past few years.  She is also taking oral mesalamine. -She is following up with Dr. Laural Golden      Orders placed this encounter:  Orders Placed This Encounter  Procedures   CBC with Differential/Platelet   Comprehensive metabolic panel   Lactate dehydrogenase      Francene Finders, FNP-C [place] [phone]

## 2019-10-20 ENCOUNTER — Other Ambulatory Visit: Payer: Self-pay

## 2019-10-20 ENCOUNTER — Inpatient Hospital Stay (HOSPITAL_COMMUNITY): Payer: Medicare PPO | Admitting: Hematology

## 2019-10-20 ENCOUNTER — Inpatient Hospital Stay (HOSPITAL_COMMUNITY): Payer: Medicare PPO | Attending: Hematology

## 2019-10-20 VITALS — BP 161/75 | HR 79 | Temp 97.3°F | Resp 20 | Wt 179.6 lb

## 2019-10-20 DIAGNOSIS — Z86718 Personal history of other venous thrombosis and embolism: Secondary | ICD-10-CM | POA: Diagnosis not present

## 2019-10-20 DIAGNOSIS — C50212 Malignant neoplasm of upper-inner quadrant of left female breast: Secondary | ICD-10-CM | POA: Insufficient documentation

## 2019-10-20 DIAGNOSIS — Z7901 Long term (current) use of anticoagulants: Secondary | ICD-10-CM | POA: Diagnosis not present

## 2019-10-20 DIAGNOSIS — M1990 Unspecified osteoarthritis, unspecified site: Secondary | ICD-10-CM | POA: Insufficient documentation

## 2019-10-20 DIAGNOSIS — Z170 Estrogen receptor positive status [ER+]: Secondary | ICD-10-CM

## 2019-10-20 DIAGNOSIS — R0602 Shortness of breath: Secondary | ICD-10-CM | POA: Insufficient documentation

## 2019-10-20 DIAGNOSIS — Z9012 Acquired absence of left breast and nipple: Secondary | ICD-10-CM | POA: Insufficient documentation

## 2019-10-20 DIAGNOSIS — K5190 Ulcerative colitis, unspecified, without complications: Secondary | ICD-10-CM | POA: Diagnosis not present

## 2019-10-20 DIAGNOSIS — Z8719 Personal history of other diseases of the digestive system: Secondary | ICD-10-CM | POA: Diagnosis not present

## 2019-10-20 DIAGNOSIS — Z853 Personal history of malignant neoplasm of breast: Secondary | ICD-10-CM | POA: Insufficient documentation

## 2019-10-20 DIAGNOSIS — Z882 Allergy status to sulfonamides status: Secondary | ICD-10-CM | POA: Insufficient documentation

## 2019-10-20 DIAGNOSIS — Z5112 Encounter for antineoplastic immunotherapy: Secondary | ICD-10-CM | POA: Diagnosis not present

## 2019-10-20 DIAGNOSIS — M8580 Other specified disorders of bone density and structure, unspecified site: Secondary | ICD-10-CM | POA: Diagnosis not present

## 2019-10-20 DIAGNOSIS — Z87891 Personal history of nicotine dependence: Secondary | ICD-10-CM | POA: Insufficient documentation

## 2019-10-20 DIAGNOSIS — Z8042 Family history of malignant neoplasm of prostate: Secondary | ICD-10-CM | POA: Diagnosis not present

## 2019-10-20 LAB — LACTATE DEHYDROGENASE: LDH: 141 U/L (ref 98–192)

## 2019-10-20 LAB — CBC WITH DIFFERENTIAL/PLATELET
Abs Immature Granulocytes: 0.01 10*3/uL (ref 0.00–0.07)
Basophils Absolute: 0 10*3/uL (ref 0.0–0.1)
Basophils Relative: 1 %
Eosinophils Absolute: 0.1 10*3/uL (ref 0.0–0.5)
Eosinophils Relative: 2 %
HCT: 40.9 % (ref 36.0–46.0)
Hemoglobin: 13.2 g/dL (ref 12.0–15.0)
Immature Granulocytes: 0 %
Lymphocytes Relative: 43 %
Lymphs Abs: 2.5 10*3/uL (ref 0.7–4.0)
MCH: 30.9 pg (ref 26.0–34.0)
MCHC: 32.3 g/dL (ref 30.0–36.0)
MCV: 95.8 fL (ref 80.0–100.0)
Monocytes Absolute: 0.6 10*3/uL (ref 0.1–1.0)
Monocytes Relative: 10 %
Neutro Abs: 2.7 10*3/uL (ref 1.7–7.7)
Neutrophils Relative %: 44 %
Platelets: 202 10*3/uL (ref 150–400)
RBC: 4.27 MIL/uL (ref 3.87–5.11)
RDW: 13.1 % (ref 11.5–15.5)
WBC: 6 10*3/uL (ref 4.0–10.5)
nRBC: 0 % (ref 0.0–0.2)

## 2019-10-20 LAB — COMPREHENSIVE METABOLIC PANEL
ALT: 20 U/L (ref 0–44)
AST: 21 U/L (ref 15–41)
Albumin: 3.6 g/dL (ref 3.5–5.0)
Alkaline Phosphatase: 43 U/L (ref 38–126)
Anion gap: 7 (ref 5–15)
BUN: 19 mg/dL (ref 8–23)
CO2: 26 mmol/L (ref 22–32)
Calcium: 8.9 mg/dL (ref 8.9–10.3)
Chloride: 105 mmol/L (ref 98–111)
Creatinine, Ser: 0.95 mg/dL (ref 0.44–1.00)
GFR calc Af Amer: >60 mL/min (ref >60)
GFR calc non Af Amer: 56 mL/min — ABNORMAL LOW (ref >60)
Glucose, Bld: 115 mg/dL — ABNORMAL HIGH (ref 70–99)
Potassium: 4 mmol/L (ref 3.5–5.1)
Sodium: 138 mmol/L (ref 135–145)
Total Bilirubin: 0.5 mg/dL (ref 0.3–1.2)
Total Protein: 7.1 g/dL (ref 6.5–8.1)

## 2019-10-20 MED ORDER — SODIUM CHLORIDE 0.9% FLUSH
10.0000 mL | INTRAVENOUS | Status: DC | PRN
  Administered 2019-10-20: 10 mL via INTRAVENOUS

## 2019-10-20 MED ORDER — HEPARIN SOD (PORK) LOCK FLUSH 100 UNIT/ML IV SOLN
500.0000 [IU] | Freq: Once | INTRAVENOUS | Status: AC
  Administered 2019-10-20: 500 [IU] via INTRAVENOUS

## 2019-10-20 NOTE — Progress Notes (Signed)
Monique Day, [place] [address]   CLINIC:  Medical Oncology/Hematology  PCP:  [name], NP [address] [place] [address] [phone]   REASON FOR VISIT:  Follow-up for left breast cancer    BRIEF ONCOLOGIC HISTORY:  Oncology History  Breast cancer of upper-inner quadrant of left female breast ([place])  07/21/2018 Initial Diagnosis   Breast cancer of upper-inner quadrant of left female breast ([place])   10/22/2018 -  Chemotherapy   The patient had trastuzumab (HERCEPTIN) 300 mg in sodium chloride 0.9 % 250 mL chemo infusion, 315 mg, Intravenous,  Once, 7 of 7 cycles Dose modification: 0.06 mg/kg (1 % of original dose 6 mg/kg, Cycle 6, Reason: Other (see comments), Comment: need to resign), 6 mg/kg (original dose 6 mg/kg, Cycle 6, Reason: Other (see comments), Comment: need to reenter to sign ), 2 mg/kg (original dose 6 mg/kg, Cycle 7, Reason: Other (see comments)), 6 mg/kg (original dose 6 mg/kg, Cycle 7, Reason: Other (see comments)) Administration: 300 mg (10/22/2018), 150 mg (10/29/2018), 150 mg (11/19/2018), 450 mg (02/03/2019), 450 mg (02/24/2019), 150 mg (11/05/2018), 150 mg (11/12/2018), 150 mg (12/01/2018), 150 mg (12/10/2018), 150 mg (12/17/2018), 150 mg (12/24/2018), 150 mg (12/31/2018), 150 mg (01/07/2019), 150 mg (01/14/2019), 450 mg (03/18/2019), 450 mg (04/08/2019) PACLitaxel (TAXOL) 72 mg in sodium chloride 0.9 % 150 mL chemo infusion (</= 78m/m2), 40 mg/m2 = 72 mg (50 % of original dose 80 mg/m2), Intravenous,  Once, 3 of 3 cycles Dose modification: 40 mg/m2 (50 % of original dose 80 mg/m2, Cycle 1, Reason: Provider Judgment) Administration: 72 mg (10/22/2018), 72 mg (10/29/2018), 72 mg (11/19/2018), 72 mg (11/05/2018), 72 mg (11/12/2018), 72 mg (12/01/2018), 72 mg (12/10/2018), 72 mg (12/17/2018), 72 mg (12/24/2018), 72 mg (12/31/2018), 72 mg (01/07/2019), 72 mg (01/14/2019) trastuzumab-anns (KANJINTI) 450 mg in sodium chloride 0.9 % 250 mL chemo infusion, 462 mg  (100 % of original dose 6 mg/kg), Intravenous,  Once, 8 of 9 cycles Dose modification: 6 mg/kg (original dose 6 mg/kg, Cycle 8, Reason: Provider Judgment, Comment: change to biosimilar) Administration: 450 mg (04/29/2019), 450 mg (05/20/2019), 450 mg (07/22/2019), 450 mg (08/15/2019), 450 mg (06/10/2019), 450 mg (07/01/2019), 450 mg (09/09/2019), 450 mg (09/30/2019)  for chemotherapy treatment.       CANCER STAGING: Cancer Staging No matching staging information was found for the patient.   INTERVAL HISTORY:  [name] female seen for follow-up of left breast cancer.  Appetite is 100%.  Energy levels are 75%.  Shortness of breath at times present.  She is tolerating Femara very well.  Denies any major hot flashes or musculoskeletal symptoms.  Denies any memory issues.  No signs or symptoms of PND or orthopnea.  No new onset pains.   REVIEW OF SYSTEMS:  Review of Systems  Respiratory: Positive for shortness of breath.   All other systems reviewed and are negative.    PAST MEDICAL/SURGICAL HISTORY:  Past Medical History:  Diagnosis Date  . Adult idiopathic generalized osteoporosis   . Arthritis    "minor" (08/18/2018)  . Breast cancer, left breast ([facility] [place]   lumpectomy  . [facility] [place] [place]   lumpectomy; mastectomy  . DVT (deep venous thrombosis) ([name] "early 2000's"   RLE  . DVT (deep venous thrombosis) ([name]   . Hemorrhoids, external   . History of blood transfusion    "related to ulcerative colitis"  . Lower abdominal pain   .  Occult blood in stools   . Peripheral edema   . Seizures ([place])    one very slight seizure with the stroke; somewhere between 2005-2009  . Stroke [facility]) 2005-2009   "very light;" ; denies residual on 08/19/2018  . UC (ulcerative colitis) ([place]) dx'd 9485   Past Surgical History:  Procedure Laterality Date  . APPENDECTOMY    . BIOPSY  04/14/2019   Procedure: BIOPSY;  Surgeon: [name], MD;  Location: [facility]  SU[facility];  Service: Endoscopy;;  . bone density  12/11/10  . BREAST BIOPSY Left 1998; 2019 X 2  . BREAST BIOPSY Right 2009  . BREAST LUMPECTOMY Left 1998  . CATARACT EXTRACTION W/ INTRAOCULAR LENS  IMPLANT, BILATERAL Bilateral   . COLONOSCOPY  02/21/2011  . COLONOSCOPY  10/06/2008  . COLONOSCOPY  12/27/01  . COLONOSCOPY  05/09/05  . COLONOSCOPY N/A 07/02/2016   Procedure: COLONOSCOPY;  Surgeon: [name], MD;  Location: [facility] SU[facility];  Service: Endoscopy;  Laterality: N/A;  1055  . [place]   "I was having rectal bleeding"  . FLEXIBLE SIGMOIDOSCOPY N/A 04/14/2019   Procedure: FLEXIBLE SIGMOIDOSCOPY;  Surgeon: [name], MD;  Location: [facility] SU[facility];  Service: Endoscopy;  Laterality: N/A;  1:00  . FRACTURE SURGERY    . MASTECTOMY Right 2009  . MASTECTOMY COMPLETE / SIMPLE Left 08/19/2018  . PORTACATH PLACEMENT Right 10/15/2018   Procedure: INSERTION PORT-A-CATH RIGHT SUBCLAVIN;  Surgeon: [name], MD;  Location: [facility] ORS;  Service: General;  Laterality: Right;  pt knows to arrive at 7:30  . SIMPLE MASTECTOMY WITH AXILLARY SENTINEL NODE BIOPSY Left 08/19/2018   Procedure: LEFT SIMPLE MASTECTOMY;  Surgeon: [name], MD;  Location: [facility];  Service: General;  Laterality: Left;  . TONSILLECTOMY    . VENA CAVA FILTER PLACEMENT Right   . WRIST FRACTURE SURGERY Right      SOCIAL HISTORY:  Social History   Socioeconomic History  . Marital status: Widowed    Spouse name: Not on file  . Number of children: 0  . Years of education: Not on file  . Highest education level: Not on file  Occupational History  . Occupation: Optometrist: [employer]  Tobacco Use  . Smoking status: Former Smoker    Packs/day: 2.00    Years: 40.00    Pack years: 80.00    Types: Cigarettes    Quit date: 05/05/2001    Years since quitting: 18.4  . Smokeless tobacco: Never Used  Substance and Sexual Activity  . Alcohol use: Yes     Comment: 08/18/2018 "couple drinks/year"  . Drug use: Never  . Sexual activity: Not Currently  Other Topics Concern  . Not on file  Social History Narrative  . Not on file   Social Determinants of Health   Financial Resource Strain:   . Difficulty of Paying Living Expenses: Not on file  Food Insecurity:   . Worried About Charity fundraiser in the Last Year: Not on file  . Ran Out of Food in the Last Year: Not on file  Transportation Needs:   . Lack of Transportation (Medical): Not on file  . Lack of Transportation (Non-Medical): Not on file  Physical Activity:   . Days of Exercise per Week: Not on file  . Minutes of Exercise per Session: Not on file  Stress:   . Feeling of Stress : Not on file  Social Connections:   .  Frequency of Communication with Friends and Family: Not on file  . Frequency of Social Gatherings with Friends and Family: Not on file  . Attends Religious Services: Not on file  . Active Member of Clubs or Organizations: Not on file  . Attends Archivist Meetings: Not on file  . Marital Status: Not on file  Intimate Partner Violence:   . Fear of Current or Ex-Partner: Not on file  . Emotionally Abused: Not on file  . Physically Abused: Not on file  . Sexually Abused: Not on file    FAMILY HISTORY:  Family History  Problem Relation Age of Onset  . Prostate cancer Father   . Colon cancer Neg Hx     CURRENT MEDICATIONS:  Outpatient Encounter Medications as of 10/20/2019  Medication Sig Note  . Adalimumab (HUMIRA PEN) 40 MG/0.8ML PNKT Inject 40 mg as directed every 14 (fourteen) days. SATURDAYS.   Marland Kitchen Ascorbic Acid (VITAMIN C) 1000 MG tablet Take 1,000 mg by mouth daily.   . Calcium Carbonate-Vitamin D (CALCIUM 600+D) 600-400 MG-UNIT tablet Take 1 tablet by mouth daily.   Marland Kitchen denosumab (PROLIA) 60 MG/ML SOLN injection Inject 60 mg into the skin every 6 (six) months. Administer in upper arm, thigh, or abdomen 07/25/2019: Last dose 03/2019  . ELIQUIS 2.5  MG TABS tablet Take 1 tablet (2.5 mg total) by mouth 2 (two) times daily.   Marland Kitchen letrozole (FEMARA) 2.5 MG tablet TAKE 1 TABLET ONCE DAILY.   Marland Kitchen lidocaine-prilocaine (EMLA) cream Apply to port site 1 hour prior to appointment   . Magnesium 500 MG TABS Take 500 mg by mouth daily.   . Trastuzumab (HERCEPTIN IV) Inject into the vein every 21 ( twenty-one) days. Every 3 weeks x 1 year starting 10/22/2018   . Wheat Dextrin (BENEFIBER) CHEW Chew by mouth. Patient states that she started yesterday. She took 6.   Marland Kitchen acetaminophen (TYLENOL) 500 MG tablet Take 500 mg by mouth. Patient states that she takes 2 by mouth prior to treatment.   . Naphazoline-Pheniramine (OPCON-A) 0.027-0.315 % SOLN Place 1 drop into both eyes 3 (three) times daily as needed (dry eyes).  09/21/2018: As needed , rare per patient  . polyethylene glycol powder (GLYCOLAX/MIRALAX) powder Take 8.5 g by mouth daily as needed. (Patient not taking: Reported on 09/30/2019)   . PRESCRIPTION MEDICATION Apply 1 application topically 2 (two) times daily as needed (eczema). Ketoconazole-fluticasone 1:1 compounded cream  08/03/2018: Compounded at [facility]   No facility-administered encounter medications on file as of 10/20/2019.    ALLERGIES:  Allergies  Allergen Reactions  . Mercaptopurine Other (See Comments)    Dropped WBC  . Sulfa Antibiotics Other (See Comments)    Extreme Weakness     PHYSICAL EXAM:  ECOG Performance status: 1  Vitals:   10/20/19 0944  BP: (!) 161/75  Pulse: 79  Resp: 20  Temp: (!) 97.3 F (36.3 C)  SpO2: 95%   Filed Weights   10/20/19 0944  Weight: 179 lb 9.6 oz (81.5 kg)    Physical Exam Vitals reviewed.  Constitutional:      Appearance: Normal appearance.  Cardiovascular:     Rate and Rhythm: Normal rate and regular rhythm.     Heart sounds: Normal heart sounds.  Pulmonary:     Effort: Pulmonary effort is normal.     Breath sounds: Normal breath sounds.  Abdominal:     General: There is no distension.      Palpations: Abdomen is soft. There is  no mass.  Musculoskeletal:        General: No swelling.  Skin:    General: Skin is warm.  Neurological:     General: No focal deficit present.     Mental Status: She is alert and oriented to person, place, and time.  Psychiatric:        Mood and Affect: Mood normal.        Behavior: Behavior normal.      LABORATORY DATA:  I have reviewed the labs as listed.  CBC    Component Value Date/Time   WBC 6.0 10/20/2019 0955   RBC 4.27 10/20/2019 0955   HGB 13.2 10/20/2019 0955   HGB 14.4 03/10/2017 1254   HCT 40.9 10/20/2019 0955   HCT 41.5 03/10/2017 1254   PLT 202 10/20/2019 0955   PLT 249 03/10/2017 1254   MCV 95.8 10/20/2019 0955   MCV 91 03/10/2017 1254   MCH 30.9 10/20/2019 0955   MCHC 32.3 10/20/2019 0955   RDW 13.1 10/20/2019 0955   RDW 13.3 03/10/2017 1254   LYMPHSABS 2.5 10/20/2019 0955   LYMPHSABS 2.7 03/10/2017 1254   MONOABS 0.6 10/20/2019 0955   EOSABS 0.1 10/20/2019 0955   EOSABS 0.4 03/10/2017 1254   BASOSABS 0.0 10/20/2019 0955   BASOSABS 0.1 03/10/2017 1254   CMP Latest Ref Rng & Units 10/20/2019 09/30/2019 09/09/2019  Glucose 70 - 99 mg/dL 115(H) 116(H) 97  BUN 8 - 23 mg/dL _0 Creatinine 0.44 - 1.00 mg/dL 0.95 0.99 0.96  Sodium 135 - 145 mmol/L 138 137 138  Potassium 3.5 - 5.1 mmol/L 4.0 3.9 3.9  Chloride 98 - 111 mmol/L 105 105 103  CO2 22 - 32 mmol/L _1 Calcium 8.9 - 10.3 mg/dL 8.9 8.8(L) 9.4  Total Protein 6.5 - 8.1 g/dL 7.1 7.2 7.3  Total Bilirubin 0.3 - 1.2 mg/dL 0.5 0.6 0.6  Alkaline Phos 38 - 126 U/L 43 38 34(L)  AST 15 - 41 U/L _2 ALT 0 - 44 U/L _3 DIAGNOSTIC IMAGING:  I have independently reviewed the scans and discussed with the patient.     ASSESSMENT & PLAN:   Breast cancer of upper-inner quadrant of left female breast ([place]) 1.  Stage I HER-2 positive left breast cancer: -Left mastectomy on 08/19/2018. -12 dose of weekly paclitaxel and Herceptin from  10/22/2018 through 01/14/2019. -Herceptin every 3 weeks started on 02/03/2019. -Anastrozole started in December 2019, made her confused and forgetful. -Femara started on 02/03/2019.  She is tolerating it well. -Last echocardiogram was EF 65 to 70% on 08/08/2019. -I have reviewed her labs.  She will proceed with Herceptin today.  This will be her last dose. -We will see her back in 3 months for follow-up.  We plan to repeat 2D echocardiogram and labs.  That will be her last echocardiogram if it is normal.  2.  Osteopenia: -DEXA scan on 02/08/2018 shows T score of -1.2. -She is getting Prolia injections every 6 months with Dr. Melina Copa. -She will continue calcium and vitamin D supplements.  3.  Recurrent DVTs: -She is continuing Eliquis for multiple recurrent DVTs in the past.  Denies any bleeding episodes.  4.  Ulcerative colitis: -She is on Humira for the past few years.  She is off of oral mesalamine.     Orders placed this encounter:  Orders Placed This Encounter  Procedures  . CBC with Differential/Platelet  . Comprehensive  metabolic panel  . Lactate dehydrogenase  . CBC with Differential/Platelet  . Comprehensive metabolic panel  . Vitamin D 25 hydroxy  . ECHOCARDIOGRAM COMPLETE      Derek Jack, [place] [phone]

## 2019-10-20 NOTE — Assessment & Plan Note (Signed)
1.  Stage I HER-2 positive left breast cancer: -Left mastectomy on 08/19/2018. -12 dose of weekly paclitaxel and Herceptin from 10/22/2018 through 01/14/2019. -Herceptin every 3 weeks started on 02/03/2019. -Anastrozole started in December 2019, made her confused and forgetful. -Femara started on 02/03/2019.  She is tolerating it well. -Last echocardiogram was EF 65 to 70% on 08/08/2019. -I have reviewed her labs.  She will proceed with Herceptin today.  This will be her last dose. -We will see her back in 3 months for follow-up.  We plan to repeat 2D echocardiogram and labs.  That will be her last echocardiogram if it is normal.  2.  Osteopenia: -DEXA scan on 02/08/2018 shows T score of -1.2. -She is getting Prolia injections every 6 months with Dr. Melina Copa. -She will continue calcium and vitamin D supplements.  3.  Recurrent DVTs: -She is continuing Eliquis for multiple recurrent DVTs in the past.  Denies any bleeding episodes.  4.  Ulcerative colitis: -She is on Humira for the past few years.  She is off of oral mesalamine.

## 2019-10-20 NOTE — Patient Instructions (Addendum)
[place] at [facility] Discharge Instructions  You were seen today by Dr. Delton Coombes. He went over your recent lab results. Continue taking medications as directed. He will repeat your ECHO prior to your next visit. He will see you back in 4 months for labs and follow up.   Thank you for choosing [place] at [facility] to provide your oncology and hematology care.  To afford each patient quality time with our provider, please arrive at least 15 minutes before your scheduled appointment time.   If you have a lab appointment with the [facility] please come in thru the  Main Entrance and check in at the main information desk  You need to re-schedule your appointment should you arrive 10 or more minutes late.  We strive to give you quality time with our providers, and arriving late affects you and other patients whose appointments are after yours.  Also, if you no show three or more times for appointments you may be dismissed from the clinic at the providers discretion.     Again, thank you for choosing [facility].  Our hope is that these requests will decrease the amount of time that you wait before being seen by our physicians.       _____________________________________________________________  Should you have questions after your visit to [facility], please contact our office at [phone] between the hours of 8:00 a.m. and 4:30 p.m.  Voicemails left after 4:00 p.m. will not be returned until the following business day.  For prescription refill requests, have your pharmacy contact our office and allow 72 hours.    Cancer Center Support Programs:   > Cancer Support Group  2nd Tuesday of the month 1pm-2pm, Journey Room  '

## 2019-10-21 ENCOUNTER — Inpatient Hospital Stay (HOSPITAL_COMMUNITY): Payer: Medicare PPO

## 2019-10-21 VITALS — BP 147/69 | HR 64 | Temp 97.7°F | Resp 18 | Wt 181.8 lb

## 2019-10-21 DIAGNOSIS — Z170 Estrogen receptor positive status [ER+]: Secondary | ICD-10-CM

## 2019-10-21 DIAGNOSIS — C50212 Malignant neoplasm of upper-inner quadrant of left female breast: Secondary | ICD-10-CM

## 2019-10-21 DIAGNOSIS — Z5112 Encounter for antineoplastic immunotherapy: Secondary | ICD-10-CM | POA: Diagnosis not present

## 2019-10-21 MED ORDER — SODIUM CHLORIDE 0.9% FLUSH
10.0000 mL | INTRAVENOUS | Status: DC | PRN
  Administered 2019-10-21: 10 mL

## 2019-10-21 MED ORDER — DIPHENHYDRAMINE HCL 25 MG PO CAPS: 50.0000 mg | ORAL_CAPSULE | Freq: Once | ORAL | Status: DC

## 2019-10-21 MED ORDER — TRASTUZUMAB-ANNS CHEMO 150 MG IV SOLR
450.0000 mg | Freq: Once | INTRAVENOUS | Status: AC
  Administered 2019-10-21: 450 mg via INTRAVENOUS
  Filled 2019-10-21: qty 21.43

## 2019-10-21 MED ORDER — SODIUM CHLORIDE 0.9 % IV SOLN: Freq: Once | INTRAVENOUS | Status: AC

## 2019-10-21 MED ORDER — HEPARIN SOD (PORK) LOCK FLUSH 100 UNIT/ML IV SOLN
500.0000 [IU] | Freq: Once | INTRAVENOUS | Status: AC | PRN
  Administered 2019-10-21: 500 [IU]

## 2019-10-21 MED ORDER — ACETAMINOPHEN 325 MG PO TABS: 650.0000 mg | ORAL_TABLET | Freq: Once | ORAL | Status: DC

## 2019-10-21 NOTE — Progress Notes (Signed)
Labs done on 10-20-2019. Proceed with Kanjinti today per protocol.  Treatment given per orders. Patient tolerated it well without problems. Vitals stable and discharged home from clinic ambulatory. Follow up as scheduled.

## 2019-10-21 NOTE — Patient Instructions (Signed)
[facility] Discharge Instructions for Patients Receiving Chemotherapy  Today you received the following chemotherapy agents   To help prevent nausea and vomiting after your treatment, we encourage you to take your nausea medication   If you develop nausea and vomiting that is not controlled by your nausea medication, call the clinic.   BELOW ARE SYMPTOMS THAT SHOULD BE REPORTED IMMEDIATELY:  *FEVER GREATER THAN 100.5 F  *CHILLS WITH OR WITHOUT FEVER  NAUSEA AND VOMITING THAT IS NOT CONTROLLED WITH YOUR NAUSEA MEDICATION  *UNUSUAL SHORTNESS OF BREATH  *UNUSUAL BRUISING OR BLEEDING  TENDERNESS IN MOUTH AND THROAT WITH OR WITHOUT PRESENCE OF ULCERS  *URINARY PROBLEMS  *BOWEL PROBLEMS  UNUSUAL RASH Items with * indicate a potential emergency and should be followed up as soon as possible.  Feel free to call the clinic should you have any questions or concerns. The clinic phone number is [phone].  Please show the CHEMO ALERT CARD at check-in to the Emergency Department and triage nurse.   

## 2020-02-16 ENCOUNTER — Other Ambulatory Visit: Payer: Self-pay

## 2020-02-16 ENCOUNTER — Encounter (HOSPITAL_COMMUNITY): Payer: Self-pay

## 2020-02-16 ENCOUNTER — Inpatient Hospital Stay (HOSPITAL_COMMUNITY): Payer: Medicare PPO | Attending: Nurse Practitioner

## 2020-02-16 ENCOUNTER — Ambulatory Visit (HOSPITAL_COMMUNITY)
Admission: RE | Admit: 2020-02-16 | Discharge: 2020-02-16 | Disposition: A | Discharge status: Home / Self Care | Payer: Medicare PPO | Source: Ambulatory Visit | Attending: Hematology | Admitting: Hematology

## 2020-02-16 VITALS — BP 154/69 | HR 63 | Temp 97.3°F | Resp 18

## 2020-02-16 DIAGNOSIS — Z79899 Other long term (current) drug therapy: Secondary | ICD-10-CM | POA: Insufficient documentation

## 2020-02-16 DIAGNOSIS — Z87891 Personal history of nicotine dependence: Secondary | ICD-10-CM | POA: Diagnosis not present

## 2020-02-16 DIAGNOSIS — Z01818 Encounter for other preprocedural examination: Secondary | ICD-10-CM | POA: Insufficient documentation

## 2020-02-16 DIAGNOSIS — Z86718 Personal history of other venous thrombosis and embolism: Secondary | ICD-10-CM | POA: Insufficient documentation

## 2020-02-16 DIAGNOSIS — K5190 Ulcerative colitis, unspecified, without complications: Secondary | ICD-10-CM | POA: Diagnosis not present

## 2020-02-16 DIAGNOSIS — M1990 Unspecified osteoarthritis, unspecified site: Secondary | ICD-10-CM | POA: Insufficient documentation

## 2020-02-16 DIAGNOSIS — I517 Cardiomegaly: Secondary | ICD-10-CM | POA: Insufficient documentation

## 2020-02-16 DIAGNOSIS — M8580 Other specified disorders of bone density and structure, unspecified site: Secondary | ICD-10-CM | POA: Diagnosis not present

## 2020-02-16 DIAGNOSIS — Z170 Estrogen receptor positive status [ER+]: Secondary | ICD-10-CM | POA: Diagnosis not present

## 2020-02-16 DIAGNOSIS — Z8719 Personal history of other diseases of the digestive system: Secondary | ICD-10-CM | POA: Insufficient documentation

## 2020-02-16 DIAGNOSIS — I4510 Unspecified right bundle-branch block: Secondary | ICD-10-CM | POA: Insufficient documentation

## 2020-02-16 DIAGNOSIS — Z0189 Encounter for other specified special examinations: Secondary | ICD-10-CM | POA: Diagnosis not present

## 2020-02-16 DIAGNOSIS — Z8042 Family history of malignant neoplasm of prostate: Secondary | ICD-10-CM | POA: Insufficient documentation

## 2020-02-16 DIAGNOSIS — Z882 Allergy status to sulfonamides status: Secondary | ICD-10-CM | POA: Insufficient documentation

## 2020-02-16 DIAGNOSIS — Z95828 Presence of other vascular implants and grafts: Secondary | ICD-10-CM

## 2020-02-16 DIAGNOSIS — Z7901 Long term (current) use of anticoagulants: Secondary | ICD-10-CM | POA: Diagnosis not present

## 2020-02-16 DIAGNOSIS — C50212 Malignant neoplasm of upper-inner quadrant of left female breast: Secondary | ICD-10-CM | POA: Insufficient documentation

## 2020-02-16 DIAGNOSIS — Z452 Encounter for adjustment and management of vascular access device: Secondary | ICD-10-CM | POA: Insufficient documentation

## 2020-02-16 MED ORDER — SODIUM CHLORIDE 0.9% FLUSH
10.0000 mL | INTRAVENOUS | Status: DC | PRN
  Administered 2020-02-16: 10 mL via INTRAVENOUS

## 2020-02-16 MED ORDER — HEPARIN SOD (PORK) LOCK FLUSH 100 UNIT/ML IV SOLN
500.0000 [IU] | Freq: Once | INTRAVENOUS | Status: AC
  Administered 2020-02-16: 500 [IU] via INTRAVENOUS

## 2020-02-16 NOTE — Progress Notes (Signed)
Monique Day presented for Portacath access and flush.  Portacath located right chest wall accessed with  H 20 needle.  Good blood return present. Portacath flushed with 44m NS and 500U/58mHeparin and needle removed intact.  Procedure tolerated well and without incident.  Pt stable and discharged home ambulatory. Pt to return in 3 months for next port flush.

## 2020-02-16 NOTE — Progress Notes (Signed)
*  PRELIMINARY RESULTS* Echocardiogram 2D Echocardiogram has been performed.  Monique Day 02/16/2020, 9:33 AM

## 2020-02-23 ENCOUNTER — Other Ambulatory Visit: Payer: Self-pay

## 2020-02-23 ENCOUNTER — Inpatient Hospital Stay (HOSPITAL_COMMUNITY): Payer: Medicare PPO

## 2020-02-23 ENCOUNTER — Inpatient Hospital Stay (HOSPITAL_COMMUNITY): Payer: Medicare PPO | Admitting: Nurse Practitioner

## 2020-02-23 DIAGNOSIS — C50212 Malignant neoplasm of upper-inner quadrant of left female breast: Secondary | ICD-10-CM

## 2020-02-23 DIAGNOSIS — Z170 Estrogen receptor positive status [ER+]: Secondary | ICD-10-CM

## 2020-02-23 LAB — CBC WITH DIFFERENTIAL/PLATELET
Abs Immature Granulocytes: 0.01 10*3/uL (ref 0.00–0.07)
Basophils Absolute: 0 10*3/uL (ref 0.0–0.1)
Basophils Relative: 0 %
Eosinophils Absolute: 0.1 10*3/uL (ref 0.0–0.5)
Eosinophils Relative: 2 %
HCT: 44.1 % (ref 36.0–46.0)
Hemoglobin: 14.5 g/dL (ref 12.0–15.0)
Immature Granulocytes: 0 %
Lymphocytes Relative: 37 %
Lymphs Abs: 2.7 10*3/uL (ref 0.7–4.0)
MCH: 31.7 pg (ref 26.0–34.0)
MCHC: 32.9 g/dL (ref 30.0–36.0)
MCV: 96.5 fL (ref 80.0–100.0)
Monocytes Absolute: 0.7 10*3/uL (ref 0.1–1.0)
Monocytes Relative: 9 %
Neutro Abs: 3.8 10*3/uL (ref 1.7–7.7)
Neutrophils Relative %: 52 %
Platelets: 197 10*3/uL (ref 150–400)
RBC: 4.57 MIL/uL (ref 3.87–5.11)
RDW: 12.7 % (ref 11.5–15.5)
WBC: 7.4 10*3/uL (ref 4.0–10.5)
nRBC: 0 % (ref 0.0–0.2)

## 2020-02-23 LAB — LACTATE DEHYDROGENASE: LDH: 133 U/L (ref 98–192)

## 2020-02-23 LAB — COMPREHENSIVE METABOLIC PANEL
ALT: 16 U/L (ref 0–44)
AST: 18 U/L (ref 15–41)
Albumin: 4.1 g/dL (ref 3.5–5.0)
Alkaline Phosphatase: 26 U/L — ABNORMAL LOW (ref 38–126)
Anion gap: 9 (ref 5–15)
BUN: 20 mg/dL (ref 8–23)
CO2: 24 mmol/L (ref 22–32)
Calcium: 9.6 mg/dL (ref 8.9–10.3)
Chloride: 104 mmol/L (ref 98–111)
Creatinine, Ser: 1.01 mg/dL — ABNORMAL HIGH (ref 0.44–1.00)
GFR calc Af Amer: >60 mL/min (ref >60)
GFR calc non Af Amer: 52 mL/min — ABNORMAL LOW (ref >60)
Glucose, Bld: 103 mg/dL — ABNORMAL HIGH (ref 70–99)
Potassium: 4.3 mmol/L (ref 3.5–5.1)
Sodium: 137 mmol/L (ref 135–145)
Total Bilirubin: 0.9 mg/dL (ref 0.3–1.2)
Total Protein: 7.7 g/dL (ref 6.5–8.1)

## 2020-02-23 MED ORDER — LETROZOLE 2.5 MG PO TABS: 2.5000 mg | ORAL_TABLET | Freq: Every day | ORAL | 6 refills | Status: DC

## 2020-02-23 NOTE — Progress Notes (Signed)
Monique Day, [place] [id number]   CLINIC:  Medical Oncology/Hematology  PCP:  Monique Sill, NP [address] [place] [address] [place] [address] [phone]   REASON FOR VISIT: Follow-up for breast cancer   CURRENT THERAPY: Surveillance per NCCN guidelines  BRIEF ONCOLOGIC HISTORY:  Oncology History  Breast cancer of upper-inner quadrant of left female breast ([place])  07/21/2018 Initial Diagnosis   Breast cancer of upper-inner quadrant of left female breast ([place])   10/22/2018 -  Chemotherapy   The patient had trastuzumab (HERCEPTIN) 300 mg in sodium chloride 0.9 % 250 mL chemo infusion, 315 mg, Intravenous,  Once, 7 of 7 cycles Dose modification: 0.06 mg/kg (1 % of original dose 6 mg/kg, Cycle 6, Reason: Other (see comments), Comment: need to resign), 6 mg/kg (original dose 6 mg/kg, Cycle 6, Reason: Other (see comments), Comment: need to reenter to sign ), 2 mg/kg (original dose 6 mg/kg, Cycle 7, Reason: Other (see comments)), 6 mg/kg (original dose 6 mg/kg, Cycle 7, Reason: Other (see comments)) Administration: 300 mg (10/22/2018), 150 mg (10/29/2018), 150 mg (11/19/2018), 450 mg (02/03/2019), 450 mg (02/24/2019), 150 mg (11/05/2018), 150 mg (11/12/2018), 150 mg (12/01/2018), 150 mg (12/10/2018), 150 mg (12/17/2018), 150 mg (12/24/2018), 150 mg (12/31/2018), 150 mg (01/07/2019), 150 mg (01/14/2019), 450 mg (03/18/2019), 450 mg (04/08/2019) PACLitaxel (TAXOL) 72 mg in sodium chloride 0.9 % 150 mL chemo infusion (</= 61m/m2), 40 mg/m2 = 72 mg (50 % of original dose 80 mg/m2), Intravenous,  Once, 3 of 3 cycles Dose modification: 40 mg/m2 (50 % of original dose 80 mg/m2, Cycle 1, Reason: Provider Judgment) Administration: 72 mg (10/22/2018), 72 mg (10/29/2018), 72 mg (11/19/2018), 72 mg (11/05/2018), 72 mg (11/12/2018), 72 mg (12/01/2018), 72 mg (12/10/2018), 72 mg (12/17/2018), 72 mg (12/24/2018), 72 mg (12/31/2018), 72 mg (01/07/2019), 72 mg (01/14/2019) trastuzumab-anns (KANJINTI) 450 mg in  sodium chloride 0.9 % 250 mL chemo infusion, 462 mg (100 % of original dose 6 mg/kg), Intravenous,  Once, 9 of 9 cycles Dose modification: 6 mg/kg (original dose 6 mg/kg, Cycle 8, Reason: Provider Judgment, Comment: change to biosimilar) Administration: 450 mg (04/29/2019), 450 mg (05/20/2019), 450 mg (07/22/2019), 450 mg (08/15/2019), 450 mg (10/21/2019), 450 mg (06/10/2019), 450 mg (07/01/2019), 450 mg (09/09/2019), 450 mg (09/30/2019)  for chemotherapy treatment.       INTERVAL HISTORY:  [name] female returns for routine follow-up for breast cancer.  Patient reports she is doing well since her last visit.  She is tolerating her medicine well. Denies any nausea, vomiting, or diarrhea. Denies any new pains. Had not noticed any recent bleeding such as epistaxis, hematuria or hematochezia. Denies recent chest pain on exertion, shortness of breath on minimal exertion, pre-syncopal episodes, or palpitations. Denies any numbness or tingling in hands or feet. Denies any recent fevers, infections, or recent hospitalizations. Patient reports appetite at 100% and energy level at 75%.  She is eating well maintain her weight at this time.     REVIEW OF SYSTEMS:  Review of Systems  All other systems reviewed and are negative.    PAST MEDICAL/SURGICAL HISTORY:  Past Medical History:  Diagnosis Date  . Adult idiopathic generalized osteoporosis   . Arthritis    "minor" (08/18/2018)  . Breast cancer, [place] breast [place]   lumpectomy  . Breast cancer, right breast ([place] 2009   lumpectomy; mastectomy  . DVT (deep venous thrombosis) ([place] "early 2000's"   RLE  . DVT (deep venous  thrombosis) ([place])   . Hemorrhoids, external   . History of blood transfusion    "related to ulcerative colitis"  . Lower abdominal pain   . Occult blood in stools   . Peripheral edema   . Seizures ([place])    one very slight seizure with the stroke; somewhere between 2005-2009  . Stroke [facility]) 2005-2009   "very  light;" ; denies residual on 08/19/2018  . UC (ulcerative colitis) ([place]) dx'd 8341   Past Surgical History:  Procedure Laterality Date  . APPENDECTOMY    . BIOPSY  04/14/2019   Procedure: BIOPSY;  Surgeon: [name], MD;  Location: [facility];  Service: Endoscopy;;  . bone density  12/11/10  . BREAST BIOPSY Left 1998; 2019 X 2  . BREAST BIOPSY Right 2009  . BREAST LUMPECTOMY Left 1998  . CATARACT EXTRACTION W/ INTRAOCULAR LENS  IMPLANT, BILATERAL Bilateral   . COLONOSCOPY  02/21/2011  . COLONOSCOPY  10/06/2008  . COLONOSCOPY  12/27/01  . COLONOSCOPY  05/09/05  . COLONOSCOPY N/A 07/02/2016   Procedure: COLONOSCOPY;  Surgeon: [name], MD;  Location: [facility] END[facility] SU[facility];  Service: Endoscopy;  Laterality: N/A;  1055  . [place]   "I was having rectal bleeding"  . FLEXIBLE SIGMOIDOSCOPY N/A 04/14/2019   Procedure: FLEXIBLE SIGMOIDOSCOPY;  Surgeon: [name], MD;  Location: [facility] SU[facility];  Service: Endoscopy;  Laterality: N/A;  1:00  . FRACTURE SURGERY    . MASTECTOMY Right 2009  . MASTECTOMY COMPLETE / SIMPLE Left 08/19/2018  . PORTACATH PLACEMENT Right 10/15/2018   Procedure: INSERTION PORT-A-CATH RIGHT SUBCLAVIN;  Surgeon: [name], MD;  Location: [facility] ORS;  Service: General;  Laterality: Right;  pt knows to arrive at 7:30  . SIMPLE MASTECTOMY WITH AXILLARY SENTINEL NODE BIOPSY Left 08/19/2018   Procedure: LEFT SIMPLE MASTECTOMY;  Surgeon: [name], MD;  Location: [facility];  Service: General;  Laterality: Left;  . TONSILLECTOMY    . VENA CAVA FILTER PLACEMENT Right   . WRIST FRACTURE SURGERY Right      SOCIAL HISTORY:  Social History   Socioeconomic History  . Marital status: Widowed    Spouse name: Not on file  . Number of children: 0  . Years of education: Not on file  . Highest education level: Not on file  Occupational History  . Occupation: Optometrist: [employer]  Tobacco Use  . Smoking  status: Former Smoker    Packs/day: 2.00    Years: 40.00    Pack years: 80.00    Types: Cigarettes    Quit date: 05/05/2001    Years since quitting: 18.8  . Smokeless tobacco: Never Used  Vaping Use  . Vaping Use: Never used  Substance and Sexual Activity  . Alcohol use: Yes    Comment: 08/18/2018 "couple drinks/year"  . Drug use: Never  . Sexual activity: Not Currently  Other Topics Concern  . Not on file  Social History Narrative  . Not on file   Social Determinants of Health   Financial Resource Strain:   . Difficulty of Paying Living Expenses:   Food Insecurity:   . Worried About Charity fundraiser in the Last Year:   . Arboriculturist in the Last Year:   Transportation Needs:   . Film/video editor (Medical):   Marland Kitchen Lack of Transportation (Non-Medical):   Physical Activity:   . Days of Exercise per Week:   .  Minutes of Exercise per Session:   Stress:   . Feeling of Stress :   Social Connections:   . Frequency of Communication with Friends and Family:   . Frequency of Social Gatherings with Friends and Family:   . Attends Religious Services:   . Active Member of Clubs or Organizations:   . Attends Archivist Meetings:   Marland Kitchen Marital Status:   Intimate Partner Violence:   . Fear of Current or Ex-Partner:   . Emotionally Abused:   Marland Kitchen Physically Abused:   . Sexually Abused:     FAMILY HISTORY:  Family History  Problem Relation Age of Onset  . Prostate cancer Father   . Colon cancer Neg Hx     CURRENT MEDICATIONS:  Outpatient Encounter Medications as of 02/23/2020  Medication Sig Note  . Adalimumab (HUMIRA PEN) 40 MG/0.8ML PNKT Inject 40 mg as directed every 14 (fourteen) days. SATURDAYS.   Marland Kitchen Ascorbic Acid (VITAMIN C) 1000 MG tablet Take 1,000 mg by mouth daily.   . Calcium Carbonate-Vitamin D (CALCIUM 600+D) 600-400 MG-UNIT tablet Take 1 tablet by mouth daily.   Marland Kitchen denosumab (PROLIA) 60 MG/ML SOLN injection Inject 60 mg into the skin every 6 (six)  months. Administer in upper arm, thigh, or abdomen 07/25/2019: Last dose 03/2019  . ELIQUIS 2.5 MG TABS tablet Take 1 tablet (2.5 mg total) by mouth 2 (two) times daily.   Marland Kitchen letrozole (FEMARA) 2.5 MG tablet TAKE 1 TABLET ONCE DAILY.   . Magnesium 500 MG TABS Take 500 mg by mouth daily.   . Naphazoline-Pheniramine (OPCON-A) 0.027-0.315 % SOLN Place 1 drop into both eyes 3 (three) times daily as needed (dry eyes).  09/21/2018: As needed , rare per patient  . Wheat Dextrin (BENEFIBER) CHEW Chew by mouth. Patient states that she started yesterday. She took 6.   Marland Kitchen acetaminophen (TYLENOL) 500 MG tablet Take 500 mg by mouth. Patient states that she takes 2 by mouth prior to treatment. (Patient not taking: Reported on 02/23/2020)   . lidocaine-prilocaine (EMLA) cream Apply to port site 1 hour prior to appointment (Patient not taking: Reported on 02/23/2020)   . PRESCRIPTION MEDICATION Apply 1 application topically 2 (two) times daily as needed (eczema). Ketoconazole-fluticasone 1:1 compounded cream  (Patient not taking: Reported on 02/23/2020) 08/03/2018: Compounded at [facility]  . Trastuzumab (HERCEPTIN IV) Inject into the vein every 21 ( twenty-one) days. Every 3 weeks x 1 year starting 10/22/2018 (Patient not taking: Reported on 02/23/2020)   . [DISCONTINUED] polyethylene glycol powder (GLYCOLAX/MIRALAX) powder Take 8.5 g by mouth daily as needed. (Patient not taking: Reported on 02/23/2020)    No facility-administered encounter medications on file as of 02/23/2020.    ALLERGIES:  Allergies  Allergen Reactions  . Mercaptopurine Other (See Comments)    Dropped WBC  . Sulfa Antibiotics Other (See Comments)    Extreme Weakness     PHYSICAL EXAM:  ECOG Performance status: 1  Vitals:   02/23/20 1047  BP: 132/80  Pulse: 66  Resp: 18  Temp: (!) 97.3 F (36.3 C)  SpO2: 96%   Filed Weights   02/23/20 1047  Weight: 176 lb 9.6 oz (80.1 kg)   Physical Exam Constitutional:      Appearance: Normal appearance.  She is normal weight.  Cardiovascular:     Rate and Rhythm: Normal rate and regular rhythm.     Heart sounds: Normal heart sounds.  Pulmonary:     Effort: Pulmonary effort is normal.  Breath sounds: Normal breath sounds.  Abdominal:     General: Bowel sounds are normal.     Palpations: Abdomen is soft.  Musculoskeletal:        General: Normal range of motion.  Skin:    General: Skin is warm.  Neurological:     Mental Status: She is alert and oriented to person, place, and time. Mental status is at baseline.  Psychiatric:        Mood and Affect: Mood normal.        Behavior: Behavior normal.        Thought Content: Thought content normal.        Judgment: Judgment normal.      LABORATORY DATA:  I have reviewed the labs as listed.  CBC    Component Value Date/Time   WBC 6.0 10/20/2019 0955   RBC 4.27 10/20/2019 0955   HGB 13.2 10/20/2019 0955   HGB 14.4 03/10/2017 1254   HCT 40.9 10/20/2019 0955   HCT 41.5 03/10/2017 1254   PLT 202 10/20/2019 0955   PLT 249 03/10/2017 1254   MCV 95.8 10/20/2019 0955   MCV 91 03/10/2017 1254   MCH 30.9 10/20/2019 0955   MCHC 32.3 10/20/2019 0955   RDW 13.1 10/20/2019 0955   RDW 13.3 03/10/2017 1254   LYMPHSABS 2.5 10/20/2019 0955   LYMPHSABS 2.7 03/10/2017 1254   MONOABS 0.6 10/20/2019 0955   EOSABS 0.1 10/20/2019 0955   EOSABS 0.4 03/10/2017 1254   BASOSABS 0.0 10/20/2019 0955   BASOSABS 0.1 03/10/2017 1254   CMP Latest Ref Rng & Units 10/20/2019 09/30/2019 09/09/2019  Glucose 70 - 99 mg/dL 115(H) 116(H) 97  BUN 8 - 23 mg/dL _0 Creatinine 0.44 - 1.00 mg/dL 0.95 0.99 0.96  Sodium 135 - 145 mmol/L 138 137 138  Potassium 3.5 - 5.1 mmol/L 4.0 3.9 3.9  Chloride 98 - 111 mmol/L 105 105 103  CO2 22 - 32 mmol/L _1 Calcium 8.9 - 10.3 mg/dL 8.9 8.8(L) 9.4  Total Protein 6.5 - 8.1 g/dL 7.1 7.2 7.3  Total Bilirubin 0.3 - 1.2 mg/dL 0.5 0.6 0.6  Alkaline Phos 38 - 126 U/L 43 38 34(L)  AST 15 - 41 U/L _2 ALT 0 - 44  U/L _3 All questions were answered to patient's stated satisfaction. Encouraged patient to call with any new concerns or questions before his next visit to the cancer center and we can certain see him sooner, if needed.     ASSESSMENT & PLAN:  Breast cancer of upper-inner quadrant of left female breast ([place]) 1.  Stage I HER-2 positive left breast cancer: -Left mastectomy on 08/19/2018, 1.1 cm IDC, grade 1, margins negative.  No lymph nodes were identified.  ER positive/PR positive/HER 2 3+. -12 doses of weekly paclitaxel and Herceptin from 10/22/2018 through 01/14/2019. -Herceptin every 3 weeks started on 02/03/2019 her last dose was on 10/21/2019. -Anastrozole started in December 2019, made her confused and forgetful. -Switched to Femara on 02/03/2019, which she is tolerating very well without any musculoskeletal symptoms or hot flashes. -Last echocardiogram on 02/16/2020 showed a EF of 60 to 65% -Labs done on 02/23/2020 were all WNL -After she is completed therapy she will be set up with a survivorship appointment. -We will see her back in 4 months with repeat labs  2.  Osteopenia: -Last DEXA scan on 02/08/2018 showed a T score of -1.2. -She is continuing Prolia every  6 months with Dr. Melina Copa. -She will continue calcium and vitamin D supplements daily. -Her primary care will order her repeat DEXA scan.  3.  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 -She denies any bleeding episodes.  4.  Ulcerative colitis: -She is on Humira for the past few years.  She is also taking oral mesalamine. -She is following up with Dr. Laural Golden     Orders placed this encounter:  Orders Placed This Encounter  Procedures  . Lactate dehydrogenase  . CBC with Differential/Platelet  . Comprehensive metabolic panel  . Lactate dehydrogenase  . CBC with Differential/Platelet  . Comprehensive metabolic panel  . Vitamin B12  . VITAMIN D 25 Hydroxy (Vit-D Deficiency,  Fractures)      Francene Finders, FNP-C [place] [phone]

## 2020-02-23 NOTE — Patient Instructions (Signed)
[facility] Discharge Instructions  Follow up in 4 months with labs    Thank you for choosing [facility] at [facility] to provide your oncology and hematology care.  To afford each patient quality time with our provider, please arrive at least 15 minutes before your scheduled appointment time.   If you have a lab appointment with the Cancer Center please come in thru the Main Entrance and check in at the main information desk.  You need to re-schedule your appointment should you arrive 10 or more minutes late.  We strive to give you quality time with our providers, and arriving late affects you and other patients whose appointments are after yours.  Also, if you no show three or more times for appointments you may be dismissed from the clinic at the providers discretion.     Again, thank you for choosing [facility].  Our hope is that these requests will decrease the amount of time that you wait before being seen by our physicians.       _____________________________________________________________  Should you have questions after your visit to [facility], please contact our office at [phone] between the hours of 8:00 a.m. and 4:30 p.m.  Voicemails left after 4:00 p.m. will not be returned until the following business day.  For prescription refill requests, have your pharmacy contact our office and allow 72 hours.    Due to Covid, you will need to wear a mask upon entering the hospital. If you do not have a mask, a mask will be given to you at the Main Entrance upon arrival. For doctor visits, patients may have 1 support person with them. For treatment visits, patients can not have anyone with them due to social distancing guidelines and our immunocompromised population.      

## 2020-02-23 NOTE — Assessment & Plan Note (Addendum)
1.  Stage I HER-2 positive left breast cancer: -Left mastectomy on 08/19/2018, 1.1 cm IDC, grade 1, margins negative.  No lymph nodes were identified.  ER positive/PR positive/HER 2 3+. -12 doses of weekly paclitaxel and Herceptin from 10/22/2018 through 01/14/2019. -Herceptin every 3 weeks started on 02/03/2019 her last dose was on 10/21/2019. -Anastrozole started in December 2019, made her confused and forgetful. -Switched to Femara on 02/03/2019, which she is tolerating very well without any musculoskeletal symptoms or hot flashes. -Last echocardiogram on 02/16/2020 showed a EF of 60 to 65% -Labs done on 02/23/2020 were all WNL -After she is completed therapy she will be set up with a survivorship appointment. -We will see her back in 4 months with repeat labs  2.  Osteopenia: -Last DEXA scan on 02/08/2018 showed a T score of -1.2. -She is continuing Prolia every 6 months with Dr. Melina Copa. -She will continue calcium and vitamin D supplements daily. -Her primary care will order her repeat DEXA scan.  3.  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 -She denies any bleeding episodes.  4.  Ulcerative colitis: -She is on Humira for the past few years.  She is also taking oral mesalamine. -She is following up with Dr. Laural Golden

## 2020-03-27 ENCOUNTER — Other Ambulatory Visit: Payer: Self-pay

## 2020-03-27 ENCOUNTER — Ambulatory Visit (INDEPENDENT_AMBULATORY_CARE_PROVIDER_SITE_OTHER): Payer: Medicare PPO | Admitting: Internal Medicine

## 2020-03-27 ENCOUNTER — Encounter (INDEPENDENT_AMBULATORY_CARE_PROVIDER_SITE_OTHER): Payer: Self-pay | Admitting: Internal Medicine

## 2020-03-27 VITALS — BP 130/78 | HR 73 | Temp 99.1°F | Ht 63.0 in | Wt 172.9 lb

## 2020-03-27 DIAGNOSIS — K5100 Ulcerative (chronic) pancolitis without complications: Secondary | ICD-10-CM | POA: Diagnosis not present

## 2020-03-27 MED ORDER — HUMIRA PEN 40 MG/0.8ML ~~LOC~~ PNKT: 40.0000 mg | PEN_INJECTOR | SUBCUTANEOUS | 11 refills | Status: DC

## 2020-03-27 NOTE — Progress Notes (Addendum)
Presenting complaint;  Follow-up for ulcerative colitis.  Database and subjective:  Patient is 84-year-old Caucasian female who has over 50-year history of pan ulcerative colitis.  She has been maintained on Humira/adalimumab.  Remission was documented back in November 2017 when she had colonoscopy.  She was evaluated for rectal bleeding 1 year ago and flexible sigmoidoscopy reveals active disease in the rectum.  She was treated with hydrocortisone enemas.  She was last seen 6 months ago.  She says she is doing fine.  She is having formed stool daily.  She notices blood on the tissue occasionally.  She has not had frank rectal bleeding.  She is more active.  She says she is also drinking 8 ounces of water with vinegar every morning and she believes as what led to 5 pound weight loss.  She is not having any side effects with Humira.  Patient says she was given Farxiga samples by her physician which she will start tomorrow.   Current Medications: Outpatient Encounter Medications as of 03/27/2020  Medication Sig  . Adalimumab (HUMIRA PEN) 40 MG/0.8ML PNKT Inject 40 mg as directed every 14 (fourteen) days. SATURDAYS.  . Ascorbic Acid (VITAMIN C) 1000 MG tablet Take 1,000 mg by mouth daily.  . Calcium Carbonate-Vitamin D (CALCIUM 600+D) 600-400 MG-UNIT tablet Take 1 tablet by mouth daily.  . denosumab (PROLIA) 60 MG/ML SOLN injection Inject 60 mg into the skin every 6 (six) months. Administer in upper arm, thigh, or abdomen  . ELIQUIS 2.5 MG TABS tablet Take 1 tablet (2.5 mg total) by mouth 2 (two) times daily.  . letrozole (FEMARA) 2.5 MG tablet Take 1 tablet (2.5 mg total) by mouth daily.  . Magnesium 500 MG TABS Take 500 mg by mouth daily.  . PRESCRIPTION MEDICATION Apply 1 application topically 2 (two) times daily as needed (eczema). Ketoconazole-fluticasone 1:1 compounded cream   . Wheat Dextrin (BENEFIBER) CHEW Chew by mouth in the morning and at bedtime. Patient states that she started  yesterday. She took 6.   . lidocaine-prilocaine (EMLA) cream Apply to port site 1 hour prior to appointment (Patient not taking: Reported on 02/23/2020)  . [DISCONTINUED] acetaminophen (TYLENOL) 500 MG tablet Take 500 mg by mouth. Patient states that she takes 2 by mouth prior to treatment. (Patient not taking: Reported on 02/23/2020)  . [DISCONTINUED] Naphazoline-Pheniramine (OPCON-A) 0.027-0.315 % SOLN Place 1 drop into both eyes 3 (three) times daily as needed (dry eyes).  (Patient not taking: Reported on 03/27/2020)  . [DISCONTINUED] Trastuzumab (HERCEPTIN IV) Inject into the vein every 21 ( twenty-one) days. Every 3 weeks x 1 year starting 10/22/2018 (Patient not taking: Reported on 02/23/2020)   No facility-administered encounter medications on file as of 03/27/2020.    Objective: Blood pressure 130/78, pulse 73, temperature 99.1 F (37.3 C), temperature source Oral, height 5' 3" (1.6 m), weight 172 lb 14.4 oz (78.4 kg). Patient is alert and in no acute distress. Patient is wearing a mask. Conjunctiva is pink. Sclera is nonicteric Oropharyngeal mucosa is normal. No neck masses or thyromegaly noted. Cardiac exam with regular rhythm normal S1 and S2. No murmur or gallop noted. Lungs are clear to auscultation. Abdomen is full but soft and nontender with no organomegaly or masses. No LE edema or clubbing noted.  Labs/studies Results:  CBC Latest Ref Rng & Units 02/23/2020 10/20/2019 09/30/2019  WBC 4.0 - 10.5 K/uL 7.4 6.0 5.9  Hemoglobin 12.0 - 15.0 g/dL 14.5 13.2 12.6  Hematocrit 36 - 46 % 44.1 40.9 38.6    Platelets 150 - 400 K/uL 197 202 208    CMP Latest Ref Rng & Units 02/23/2020 10/20/2019 09/30/2019  Glucose 70 - 99 mg/dL 103(H) 115(H) 116(H)  BUN 8 - 23 mg/dL _0 Creatinine 0.44 - 1.00 mg/dL 1.01(H) 0.95 0.99  Sodium 135 - 145 mmol/L 137 138 137  Potassium 3.5 - 5.1 mmol/L 4.3 4.0 3.9  Chloride 98 - 111 mmol/L 104 105 105  CO2 22 - 32 mmol/L _1 Calcium 8.9 - 10.3 mg/dL 9.6 8.9  8.8(L)  Total Protein 6.5 - 8.1 g/dL 7.7 7.1 7.2  Total Bilirubin 0.3 - 1.2 mg/dL 0.9 0.5 0.6  Alkaline Phos 38 - 126 U/L 26(L) 43 38  AST 15 - 41 U/L _2 ALT 0 - 44 U/L _3 Hepatic Function Latest Ref Rng & Units 02/23/2020 10/20/2019 09/30/2019  Total Protein 6.5 - 8.1 g/dL 7.7 7.1 7.2  Albumin 3.5 - 5.0 g/dL 4.1 3.6 3.6  AST 15 - 41 U/L _4 ALT 0 - 44 U/L _5 Alk Phosphatase 38 - 126 U/L 26(L) 43 38  Total Bilirubin 0.3 - 1.2 mg/dL 0.9 0.5 0.6    Lab Results  Component Value Date   CRP <0.5 06/19/2014    Lab data reviewed with patient.  Assessment:  #1.  History of ulcerative colitis of over 50 years.  She had flareup last year with activity involving rectum responding to topical steroid.  She was not able to tolerate mesalamine suppositories.  It appears she is in remission.  She will continue Humira/adalimumab as long as it is working.   Plan:  Continue Humira/adalimumab at a dose of 40 mg subcu every 14 days.  New prescription sent to patient's pharmacy for 1 month with 11 refills. Patient will call office if she has abdominal pain diarrhea or rectal bleeding. Office visit in 6 months.

## 2020-03-27 NOTE — Patient Instructions (Signed)
Please notify if you experience diarrhea abdominal pain or rectal bleeding.

## 2020-05-14 IMAGING — MG MM CLIP PLACEMENT
6 series · 6 of 14 positions shown · non-contrast
Comparison: Previous exam(s).

CLINICAL DATA: Evaluate clip placement following stereotactic
guided LEFT breast biopsy

EXAM:
DIAGNOSTIC LEFT MAMMOGRAM POST STEREOTACTIC BIOPSY

[L ML synth-2D]
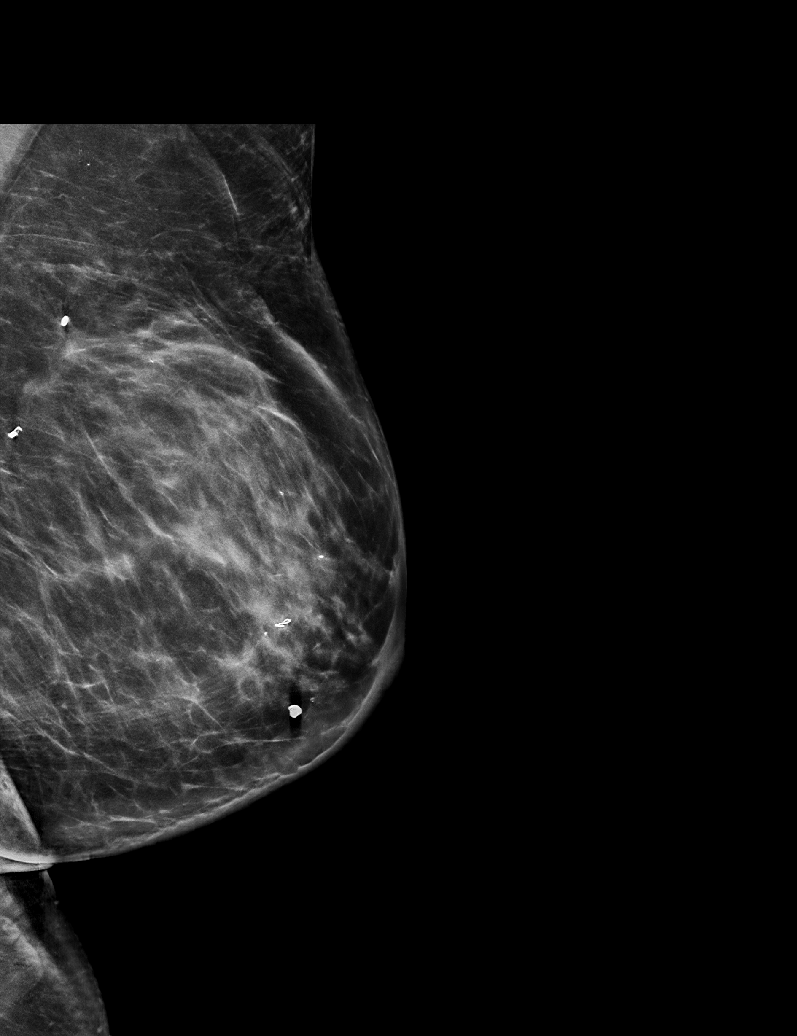

[L CC]
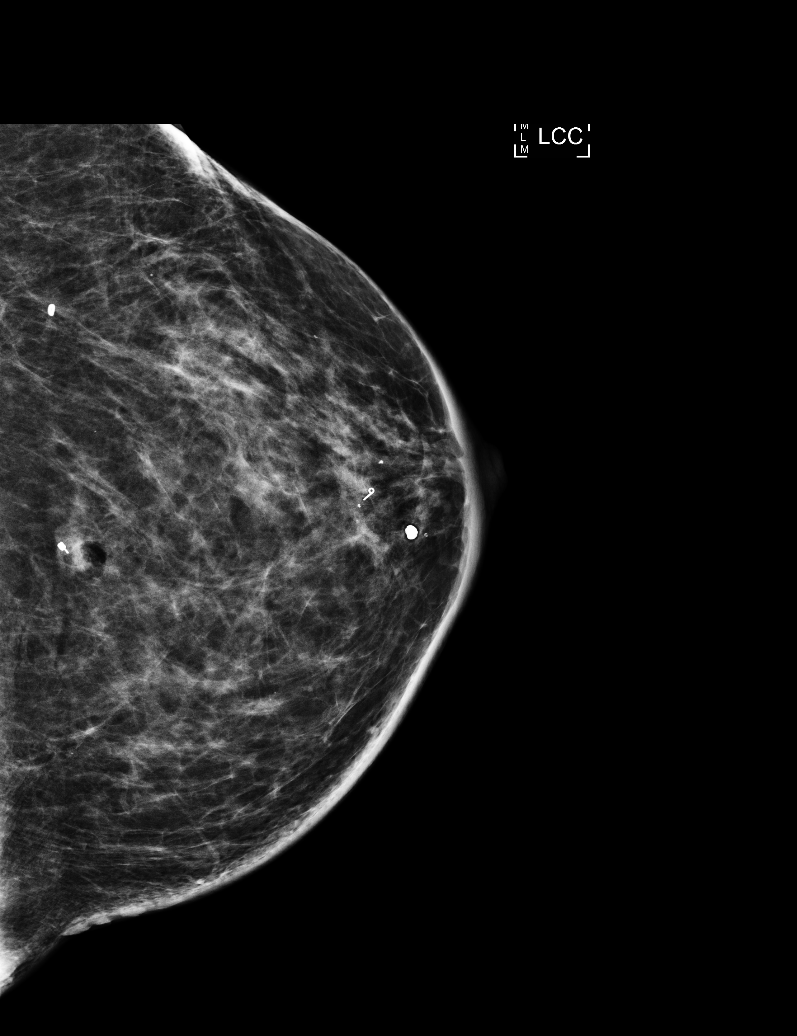

[L CC synth-2D]
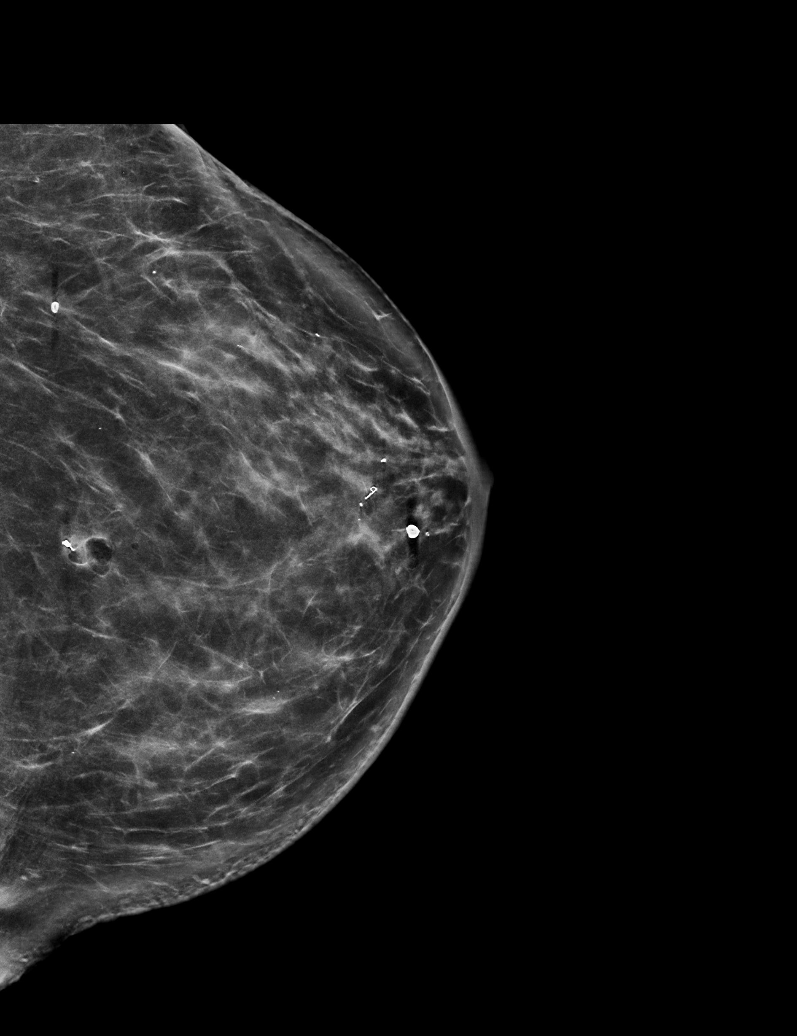

[L ML]
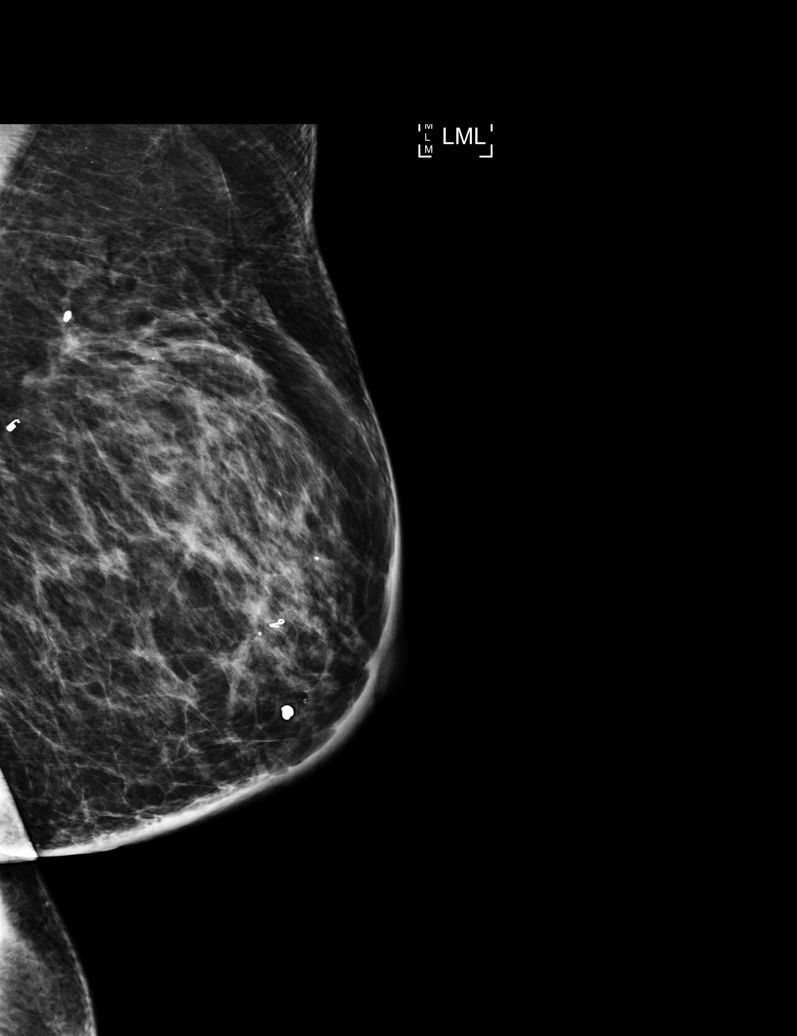

[L CC tomo · tomo slice 39/77.0]
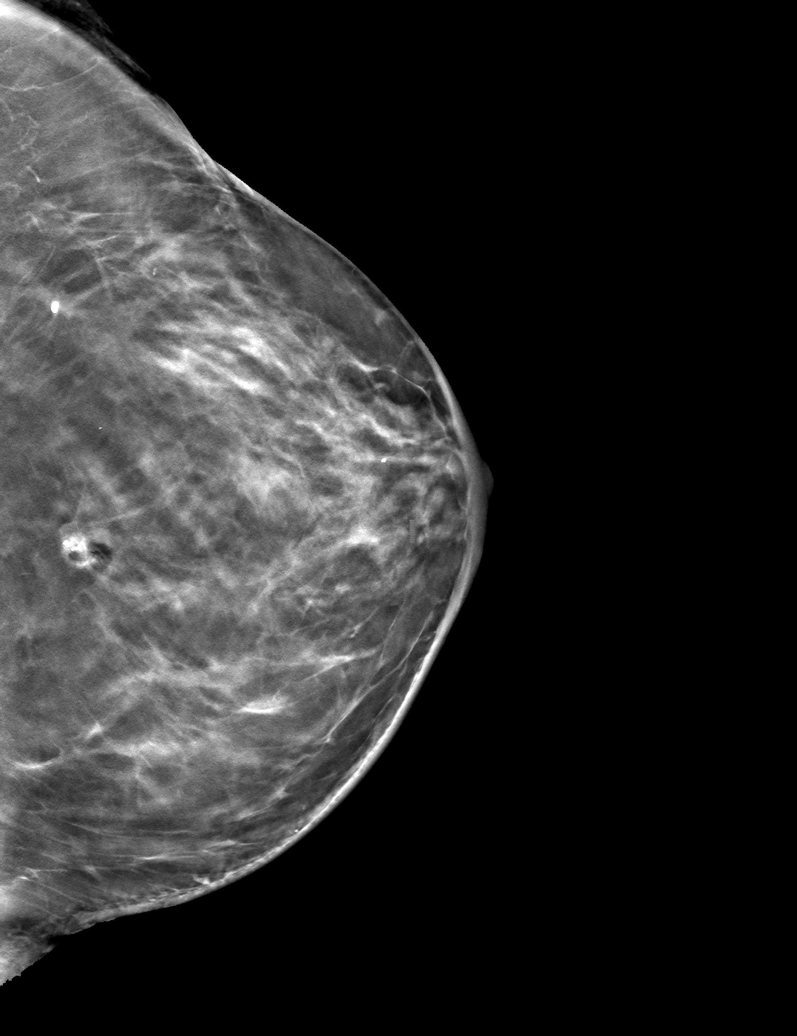

[L ML tomo · tomo slice 41/80.0]
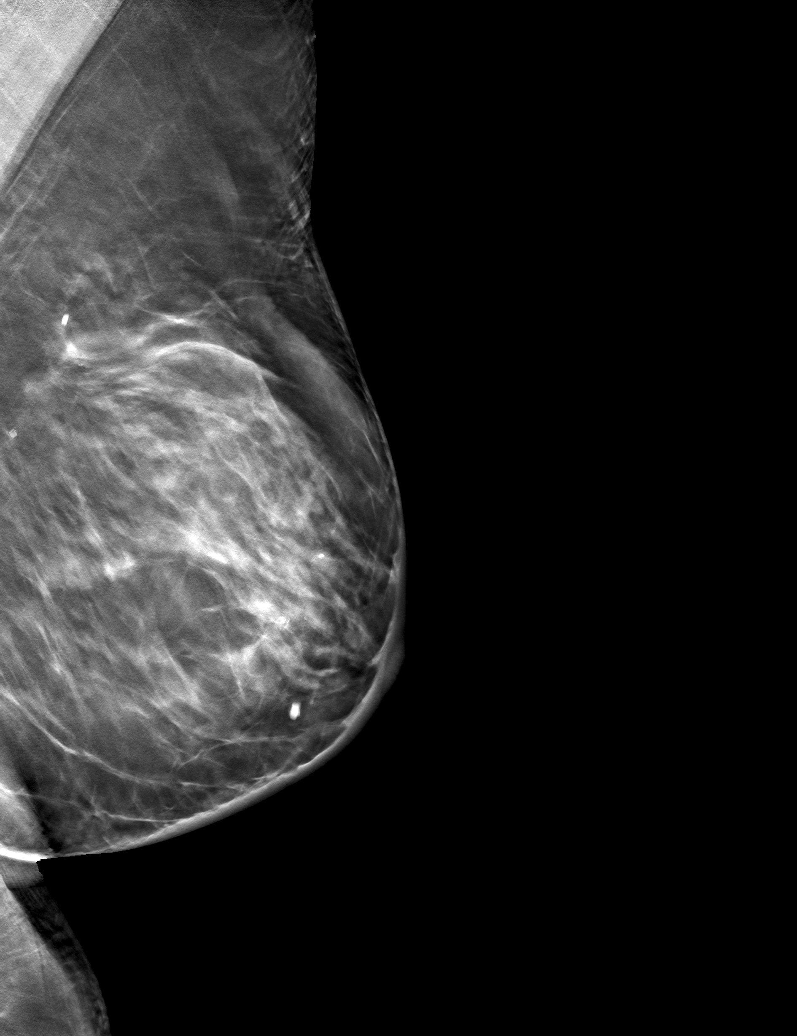

[6 of 14 positions shown; findings below may reference images not displayed]

FINDINGS: Mammographic images were obtained following 3D/stereotactic guided
biopsy of irregular mass within the posterior UPPER slightly INNER
LEFT breast.

The COIL shaped clip is in satisfactory position.

The wing clip in the anterior LEFT breast from recent outside
ultrasound-guided biopsy is noted. The 2 clips are separated by a
distance of 7 cm.
IMPRESSION: Satisfactory COIL clip position following 3D/stereotactic guided
LEFT breast biopsy. This COIL clip and the wing clip from recent
outside ultrasound-guided LEFT breast biopsy are separated by a
distance of 7 cm.

Final Assessment: Post Procedure Mammograms for Marker Placement

## 2020-05-21 ENCOUNTER — Encounter (HOSPITAL_COMMUNITY): Payer: Self-pay

## 2020-05-21 ENCOUNTER — Inpatient Hospital Stay (HOSPITAL_COMMUNITY): Payer: Medicare PPO | Attending: Nurse Practitioner

## 2020-05-21 ENCOUNTER — Other Ambulatory Visit: Payer: Self-pay

## 2020-05-21 DIAGNOSIS — C50212 Malignant neoplasm of upper-inner quadrant of left female breast: Secondary | ICD-10-CM | POA: Insufficient documentation

## 2020-05-21 DIAGNOSIS — Z170 Estrogen receptor positive status [ER+]: Secondary | ICD-10-CM | POA: Diagnosis not present

## 2020-05-21 DIAGNOSIS — Z79899 Other long term (current) drug therapy: Secondary | ICD-10-CM | POA: Insufficient documentation

## 2020-05-21 DIAGNOSIS — M8580 Other specified disorders of bone density and structure, unspecified site: Secondary | ICD-10-CM | POA: Insufficient documentation

## 2020-05-21 DIAGNOSIS — Z86718 Personal history of other venous thrombosis and embolism: Secondary | ICD-10-CM | POA: Insufficient documentation

## 2020-05-21 MED ORDER — HEPARIN SOD (PORK) LOCK FLUSH 100 UNIT/ML IV SOLN
500.0000 [IU] | Freq: Once | INTRAVENOUS | Status: AC
  Administered 2020-05-21: 500 [IU] via INTRAVENOUS

## 2020-05-21 MED ORDER — SODIUM CHLORIDE 0.9% FLUSH
10.0000 mL | INTRAVENOUS | Status: DC | PRN
  Administered 2020-05-21: 10 mL via INTRAVENOUS

## 2020-05-21 NOTE — Progress Notes (Signed)
Monique Day Client tolerated portacath flush well without complaints or incident. Port accessed with 20 gauge needle with good blood return noted then flushed easily per protocol and de-accessed. VSS Pt discharged self ambulatory in satisfactory condition

## 2020-05-21 NOTE — Patient Instructions (Signed)
[place] at [facility] Discharge Instructions  Portacath flushed per protocol today. Follow-up as scheduled   Thank you for choosing [place] at [facility] to provide your oncology and hematology care.  To afford each patient quality time with our provider, please arrive at least 15 minutes before your scheduled appointment time.   If you have a lab appointment with the [facility] please come in thru the Main Entrance and check in at the main information desk.  You need to re-schedule your appointment should you arrive 10 or more minutes late.  We strive to give you quality time with our providers, and arriving late affects you and other patients whose appointments are after yours.  Also, if you no show three or more times for appointments you may be dismissed from the clinic at the providers discretion.     Again, thank you for choosing [facility].  Our hope is that these requests will decrease the amount of time that you wait before being seen by our physicians.       _____________________________________________________________  Should you have questions after your visit to [facility], please contact our office at [phone] and follow the prompts.  Our office hours are 8:00 a.m. and 4:30 p.m. Monday - Friday.  Please note that voicemails left after 4:00 p.m. may not be returned until the following business day.  We are closed weekends and major holidays.  You do have access to a nurse 24-7, just call the main number to the clinic [phone] and do not press any options, hold on the line and a nurse will answer the phone.    For prescription refill requests, have your pharmacy contact our office and allow 72 hours.    Due to Covid, you will need to wear a mask upon entering the hospital. If you do not have a mask, a mask will be given to you at the Main Entrance upon arrival. For doctor visits, patients may  have 1 support person age 82 or older with them. For treatment visits, patients can not have anyone with them due to social distancing guidelines and our immunocompromised population.

## 2020-06-28 ENCOUNTER — Other Ambulatory Visit (HOSPITAL_COMMUNITY): Payer: Self-pay

## 2020-06-28 DIAGNOSIS — C50212 Malignant neoplasm of upper-inner quadrant of left female breast: Secondary | ICD-10-CM

## 2020-06-29 ENCOUNTER — Other Ambulatory Visit: Payer: Self-pay

## 2020-06-29 ENCOUNTER — Inpatient Hospital Stay (HOSPITAL_COMMUNITY): Payer: Medicare PPO | Attending: Hematology

## 2020-06-29 DIAGNOSIS — Z7901 Long term (current) use of anticoagulants: Secondary | ICD-10-CM | POA: Insufficient documentation

## 2020-06-29 DIAGNOSIS — Z8042 Family history of malignant neoplasm of prostate: Secondary | ICD-10-CM | POA: Diagnosis not present

## 2020-06-29 DIAGNOSIS — M8580 Other specified disorders of bone density and structure, unspecified site: Secondary | ICD-10-CM | POA: Diagnosis not present

## 2020-06-29 DIAGNOSIS — Z882 Allergy status to sulfonamides status: Secondary | ICD-10-CM | POA: Insufficient documentation

## 2020-06-29 DIAGNOSIS — Z170 Estrogen receptor positive status [ER+]: Secondary | ICD-10-CM | POA: Insufficient documentation

## 2020-06-29 DIAGNOSIS — Z87891 Personal history of nicotine dependence: Secondary | ICD-10-CM | POA: Diagnosis not present

## 2020-06-29 DIAGNOSIS — M1990 Unspecified osteoarthritis, unspecified site: Secondary | ICD-10-CM | POA: Diagnosis not present

## 2020-06-29 DIAGNOSIS — R410 Disorientation, unspecified: Secondary | ICD-10-CM | POA: Diagnosis not present

## 2020-06-29 DIAGNOSIS — Z79899 Other long term (current) drug therapy: Secondary | ICD-10-CM | POA: Diagnosis not present

## 2020-06-29 DIAGNOSIS — N898 Other specified noninflammatory disorders of vagina: Secondary | ICD-10-CM | POA: Diagnosis not present

## 2020-06-29 DIAGNOSIS — Z8719 Personal history of other diseases of the digestive system: Secondary | ICD-10-CM | POA: Diagnosis not present

## 2020-06-29 DIAGNOSIS — Z9049 Acquired absence of other specified parts of digestive tract: Secondary | ICD-10-CM | POA: Diagnosis not present

## 2020-06-29 DIAGNOSIS — Z86718 Personal history of other venous thrombosis and embolism: Secondary | ICD-10-CM | POA: Diagnosis not present

## 2020-06-29 DIAGNOSIS — C50212 Malignant neoplasm of upper-inner quadrant of left female breast: Secondary | ICD-10-CM | POA: Diagnosis present

## 2020-06-29 DIAGNOSIS — K5190 Ulcerative colitis, unspecified, without complications: Secondary | ICD-10-CM | POA: Insufficient documentation

## 2020-06-29 LAB — CBC WITH DIFFERENTIAL/PLATELET
Abs Immature Granulocytes: 0.01 10*3/uL (ref 0.00–0.07)
Basophils Absolute: 0 10*3/uL (ref 0.0–0.1)
Basophils Relative: 0 %
Eosinophils Absolute: 0.2 10*3/uL (ref 0.0–0.5)
Eosinophils Relative: 3 %
HCT: 47.7 % — ABNORMAL HIGH (ref 36.0–46.0)
Hemoglobin: 15.5 g/dL — ABNORMAL HIGH (ref 12.0–15.0)
Immature Granulocytes: 0 %
Lymphocytes Relative: 39 %
Lymphs Abs: 2.6 10*3/uL (ref 0.7–4.0)
MCH: 31.9 pg (ref 26.0–34.0)
MCHC: 32.5 g/dL (ref 30.0–36.0)
MCV: 98.1 fL (ref 80.0–100.0)
Monocytes Absolute: 0.5 10*3/uL (ref 0.1–1.0)
Monocytes Relative: 8 %
Neutro Abs: 3.3 10*3/uL (ref 1.7–7.7)
Neutrophils Relative %: 50 %
Platelets: 188 10*3/uL (ref 150–400)
RBC: 4.86 MIL/uL (ref 3.87–5.11)
RDW: 12.9 % (ref 11.5–15.5)
WBC: 6.7 10*3/uL (ref 4.0–10.5)
nRBC: 0 % (ref 0.0–0.2)

## 2020-06-29 LAB — COMPREHENSIVE METABOLIC PANEL
ALT: 22 U/L (ref 0–44)
AST: 20 U/L (ref 15–41)
Albumin: 4.2 g/dL (ref 3.5–5.0)
Alkaline Phosphatase: 28 U/L — ABNORMAL LOW (ref 38–126)
Anion gap: 10 (ref 5–15)
BUN: 16 mg/dL (ref 8–23)
CO2: 27 mmol/L (ref 22–32)
Calcium: 9.2 mg/dL (ref 8.9–10.3)
Chloride: 102 mmol/L (ref 98–111)
Creatinine, Ser: 1.03 mg/dL — ABNORMAL HIGH (ref 0.44–1.00)
GFR, Estimated: 54 mL/min — ABNORMAL LOW (ref >60)
Glucose, Bld: 84 mg/dL (ref 70–99)
Potassium: 4 mmol/L (ref 3.5–5.1)
Sodium: 139 mmol/L (ref 135–145)
Total Bilirubin: 0.6 mg/dL (ref 0.3–1.2)
Total Protein: 8 g/dL (ref 6.5–8.1)

## 2020-06-29 LAB — LACTATE DEHYDROGENASE: LDH: 137 U/L (ref 98–192)

## 2020-06-29 LAB — VITAMIN B12: Vitamin B-12: 514 pg/mL (ref 180–914)

## 2020-06-29 LAB — VITAMIN D 25 HYDROXY (VIT D DEFICIENCY, FRACTURES): Vit D, 25-Hydroxy: 41.12 ng/mL (ref 30–100)

## 2020-07-06 ENCOUNTER — Other Ambulatory Visit: Payer: Self-pay

## 2020-07-06 ENCOUNTER — Inpatient Hospital Stay (HOSPITAL_COMMUNITY): Payer: Medicare PPO | Admitting: Oncology

## 2020-07-06 VITALS — BP 148/77 | HR 70 | Temp 98.1°F | Resp 16 | Wt 175.9 lb

## 2020-07-06 DIAGNOSIS — C50212 Malignant neoplasm of upper-inner quadrant of left female breast: Secondary | ICD-10-CM | POA: Diagnosis not present

## 2020-07-06 DIAGNOSIS — Z170 Estrogen receptor positive status [ER+]: Secondary | ICD-10-CM

## 2020-07-06 MED ORDER — EXEMESTANE 25 MG PO TABS: 25.0000 mg | ORAL_TABLET | Freq: Every day | ORAL | 1 refills | Status: DC

## 2020-07-06 NOTE — Progress Notes (Signed)
[place], [place] [address]   CLINIC:  Medical Oncology/Hematology  PCP:  [name], NP [address] [place] [address] [phone]   REASON FOR VISIT: Follow-up for breast cancer   CURRENT THERAPY: Surveillance per NCCN guidelines  BRIEF ONCOLOGIC HISTORY:  Oncology History  Breast cancer of upper-inner quadrant of left female breast ([place])  07/21/2018 Initial Diagnosis   Breast cancer of upper-inner quadrant of left female breast ([place])   10/22/2018 -  Chemotherapy   The patient had trastuzumab (HERCEPTIN) 300 mg in sodium chloride 0.9 % 250 mL chemo infusion, 315 mg, Intravenous,  Once, 7 of 7 cycles Dose modification: 0.06 mg/kg (1 % of original dose 6 mg/kg, Cycle 6, Reason: Other (see comments), Comment: need to resign), 6 mg/kg (original dose 6 mg/kg, Cycle 6, Reason: Other (see comments), Comment: need to reenter to sign ), 2 mg/kg (original dose 6 mg/kg, Cycle 7, Reason: Other (see comments)), 6 mg/kg (original dose 6 mg/kg, Cycle 7, Reason: Other (see comments)) Administration: 300 mg (10/22/2018), 150 mg (10/29/2018), 150 mg (11/19/2018), 450 mg (02/03/2019), 450 mg (02/24/2019), 150 mg (11/05/2018), 150 mg (11/12/2018), 150 mg (12/01/2018), 150 mg (12/10/2018), 150 mg (12/17/2018), 150 mg (12/24/2018), 150 mg (12/31/2018), 150 mg (01/07/2019), 150 mg (01/14/2019), 450 mg (03/18/2019), 450 mg (04/08/2019) PACLitaxel (TAXOL) 72 mg in sodium chloride 0.9 % 150 mL chemo infusion (</= 11m/m2), 40 mg/m2 = 72 mg (50 % of original dose 80 mg/m2), Intravenous,  Once, 3 of 3 cycles Dose modification: 40 mg/m2 (50 % of original dose 80 mg/m2, Cycle 1, Reason: Provider Judgment) Administration: 72 mg (10/22/2018), 72 mg (10/29/2018), 72 mg (11/19/2018), 72 mg (11/05/2018), 72 mg (11/12/2018), 72 mg (12/01/2018), 72 mg (12/10/2018), 72 mg (12/17/2018), 72 mg (12/24/2018), 72 mg (12/31/2018), 72 mg (01/07/2019), 72 mg (01/14/2019) trastuzumab-anns (KANJINTI) 450 mg in  sodium chloride 0.9 % 250 mL chemo infusion, 462 mg (100 % of original dose 6 mg/kg), Intravenous,  Once, 9 of 9 cycles Dose modification: 6 mg/kg (original dose 6 mg/kg, Cycle 8, Reason: Provider Judgment, Comment: change to biosimilar) Administration: 450 mg (04/29/2019), 450 mg (05/20/2019), 450 mg (07/22/2019), 450 mg (08/15/2019), 450 mg (10/21/2019), 450 mg (06/10/2019), 450 mg (07/01/2019), 450 mg (09/09/2019), 450 mg (09/30/2019)  for chemotherapy treatment.     INTERVAL HISTORY:  [name] returns for routine follow-up for breast cancer.  She reports worsening vaginal irritation since initiating Femara.  She has tried several creams without resolution.  She states the itching is worse at bedtime and she is unable to sleep.  She denies any hot flashes or joint pain.  Denies any nausea, vomiting, or diarrhea. Denies any new pains. Had not noticed any recent bleeding such as epistaxis, hematuria or hematochezia. Denies recent chest pain on exertion, shortness of breath on minimal exertion, pre-syncopal episodes, or palpitations. Denies any numbness or tingling in hands or feet. Denies any recent fevers, infections, or recent hospitalizations. Patient reports appetite at 100% and energy level at 75%.  She is eating well maintain her weight at this time.  REVIEW OF SYSTEMS:  Review of Systems  Genitourinary:        Vaginal itching   All other systems reviewed and are negative.    PAST MEDICAL/SURGICAL HISTORY:  Past Medical History:  Diagnosis Date  . Adult idiopathic generalized osteoporosis   . Arthritis    "minor" (08/18/2018)  . Breast cancer, left breast ([place] 1998  lumpectomy  . Breast cancer, right breast ([place]) 2009   lumpectomy; mastectomy  . DVT (deep venous thrombosis) ([place]) "early 2000's"   RLE  . DVT (deep venous thrombosis) ([place])   . Hemorrhoids, external   . History of blood transfusion    "related to ulcerative colitis"  . Lower abdominal pain   .  Occult blood in stools   . Peripheral edema   . Seizures ([place])    one very slight seizure with the stroke; somewhere between 2005-2009  . Stroke [facility]) 2005-2009   "very light;" ; denies residual on 08/19/2018  . UC (ulcerative colitis) ([place]) dx'd 1027   Past Surgical History:  Procedure Laterality Date  . APPENDECTOMY    . BIOPSY  04/14/2019   Procedure: BIOPSY;  Surgeon: [name], MD;  Location: [facility] END[facility];  Service: Endoscopy;;  . bone density  12/11/10  . BREAST BIOPSY Left 1998; 2019 X 2  . BREAST BIOPSY Right 2009  . BREAST LUMPECTOMY Left 1998  . CATARACT EXTRACTION W/ INTRAOCULAR LENS  IMPLANT, BILATERAL Bilateral   . COLONOSCOPY  02/21/2011  . COLONOSCOPY  10/06/2008  . COLONOSCOPY  12/27/01  . COLONOSCOPY  05/09/05  . COLONOSCOPY N/A 07/02/2016   Procedure: COLONOSCOPY;  Surgeon: [name], MD;  Location: [facility] ENDO SUITE;  Service: Endoscopy;  Laterality: N/A;  1055  . [place]   "I was having rectal bleeding"  . FLEXIBLE SIGMOIDOSCOPY N/A 04/14/2019   Procedure: FLEXIBLE SIGMOIDOSCOPY;  Surgeon: [name], MD;  Location: [facility] ENDO SU[facility];  Service: Endoscopy;  Laterality: N/A;  1:00  . FRACTURE SURGERY    . MASTECTOMY Right 2009  . MASTECTOMY COMPLETE / SIMPLE Left 08/19/2018  . PORTACATH PLACEMENT Right 10/15/2018   Procedure: INSERTION PORT-A-CATH RIGHT SUBCLAVIN;  Surgeon: [name], MD;  Location: [facility] ORS;  Service: General;  Laterality: Right;  pt knows to arrive at 7:30  . SIMPLE MASTECTOMY WITH AXILLARY SENTINEL NODE BIOPSY Left 08/19/2018   Procedure: LEFT SIMPLE MASTECTOMY;  Surgeon: [name], MD;  Location: [facility];  Service: General;  Laterality: Left;  . TONSILLECTOMY    . VENA CAVA FILTER PLACEMENT Right   . WRIST FRACTURE SURGERY Right      SOCIAL HISTORY:  Social History   Socioeconomic History  . Marital status: Widowed    Spouse name: Not on file  . Number of children: 0  . Years of education: Not on  file  . Highest education level: Not on file  Occupational History  . Occupation: Optometrist: [employer]  Tobacco Use  . Smoking status: Former Smoker    Packs/day: 2.00    Years: 40.00    Pack years: 80.00    Types: Cigarettes    Quit date: 05/05/2001    Years since quitting: 19.1  . Smokeless tobacco: Never Used  Vaping Use  . Vaping Use: Never used  Substance and Sexual Activity  . Alcohol use: Yes    Comment: 08/18/2018 "couple drinks/year"  . Drug use: Never  . Sexual activity: Not Currently  Other Topics Concern  . Not on file  Social History Narrative  . Not on file   Social Determinants of Health   Financial Resource Strain:   . Difficulty of Paying Living Expenses: Not on file  Food Insecurity:   . Worried About Charity fundraiser in the Last Year: Not on file  . Ran Out of Food in the Last  Year: Not on file  Transportation Needs:   . Lack of Transportation (Medical): Not on file  . Lack of Transportation (Non-Medical): Not on file  Physical Activity:   . Days of Exercise per Week: Not on file  . Minutes of Exercise per Session: Not on file  Stress:   . Feeling of Stress : Not on file  Social Connections:   . Frequency of Communication with Friends and Family: Not on file  . Frequency of Social Gatherings with Friends and Family: Not on file  . Attends Religious Services: Not on file  . Active Member of Clubs or Organizations: Not on file  . Attends Archivist Meetings: Not on file  . Marital Status: Not on file  Intimate Partner Violence:   . Fear of Current or Ex-Partner: Not on file  . Emotionally Abused: Not on file  . Physically Abused: Not on file  . Sexually Abused: Not on file    FAMILY HISTORY:  Family History  Problem Relation Age of Onset  . Prostate cancer Father   . Colon cancer Neg Hx     CURRENT MEDICATIONS:  Outpatient Encounter Medications as of 07/06/2020  Medication Sig Note  .  Adalimumab (HUMIRA PEN) 40 MG/0.8ML PNKT Inject 40 mg as directed every 14 (fourteen) days. SATURDAYS.   Marland Kitchen Ascorbic Acid (VITAMIN C) 1000 MG tablet Take 1,000 mg by mouth daily.   . Calcium Carbonate-Vitamin D (CALCIUM 600+D) 600-400 MG-UNIT tablet Take 1 tablet by mouth daily.   Marland Kitchen denosumab (PROLIA) 60 MG/ML SOLN injection Inject 60 mg into the skin every 6 (six) months. Administer in upper arm, thigh, or abdomen 03/27/2020: Last dose 03/26/2020  . ELIQUIS 2.5 MG TABS tablet Take 1 tablet (2.5 mg total) by mouth 2 (two) times daily.   Marland Kitchen FARXIGA 10 MG TABS tablet    . letrozole (FEMARA) 2.5 MG tablet Take 1 tablet (2.5 mg total) by mouth daily.   . Magnesium 500 MG TABS Take 500 mg by mouth daily.   Marland Kitchen PRESCRIPTION MEDICATION Apply 1 application topically 2 (two) times daily as needed (eczema). Ketoconazole-fluticasone 1:1 compounded cream  08/03/2018: Compounded at [facility]  . Wheat Dextrin (BENEFIBER) CHEW Chew by mouth in the morning and at bedtime. Patient states that she started yesterday. She took 6.    . lidocaine-prilocaine (EMLA) cream Apply to port site 1 hour prior to appointment (Patient not taking: Reported on 07/06/2020)    No facility-administered encounter medications on file as of 07/06/2020.    ALLERGIES:  Allergies  Allergen Reactions  . Mercaptopurine Other (See Comments)    Dropped WBC  . Sulfa Antibiotics Other (See Comments)    Extreme Weakness     PHYSICAL EXAM:  ECOG Performance status: 1  Vitals:   07/06/20 1042  BP: (!) 148/77  Pulse: 70  Resp: 16  Temp: 98.1 F (36.7 C)  SpO2: 96%   Filed Weights   07/06/20 1042  Weight: 175 lb 14.4 oz (79.8 kg)   Physical Exam Constitutional:      Appearance: Normal appearance. She is normal weight.  Cardiovascular:     Rate and Rhythm: Normal rate and regular rhythm.     Heart sounds: Normal heart sounds.  Pulmonary:     Effort: Pulmonary effort is normal.     Breath sounds: Normal breath sounds.    Abdominal:     General: Bowel sounds are normal.     Palpations: Abdomen is soft.  Musculoskeletal:  General: Normal range of motion.  Skin:    General: Skin is warm.  Neurological:     Mental Status: She is alert and oriented to person, place, and time. Mental status is at baseline.  Psychiatric:        Mood and Affect: Mood normal.        Behavior: Behavior normal.        Thought Content: Thought content normal.        Judgment: Judgment normal.      LABORATORY DATA:  I have reviewed the labs as listed.  CBC    Component Value Date/Time   WBC 6.7 06/29/2020 1029   RBC 4.86 06/29/2020 1029   HGB 15.5 (H) 06/29/2020 1029   HGB 14.4 03/10/2017 1254   HCT 47.7 (H) 06/29/2020 1029   HCT 41.5 03/10/2017 1254   PLT 188 06/29/2020 1029   PLT 249 03/10/2017 1254   MCV 98.1 06/29/2020 1029   MCV 91 03/10/2017 1254   MCH 31.9 06/29/2020 1029   MCHC 32.5 06/29/2020 1029   RDW 12.9 06/29/2020 1029   RDW 13.3 03/10/2017 1254   LYMPHSABS 2.6 06/29/2020 1029   LYMPHSABS 2.7 03/10/2017 1254   MONOABS 0.5 06/29/2020 1029   EOSABS 0.2 06/29/2020 1029   EOSABS 0.4 03/10/2017 1254   BASOSABS 0.0 06/29/2020 1029   BASOSABS 0.1 03/10/2017 1254   CMP Latest Ref Rng & Units 06/29/2020 02/23/2020 10/20/2019  Glucose 70 - 99 mg/dL 84 103(H) 115(H)  BUN 8 - 23 mg/dL 16 20 19   Creatinine 0.44 - 1.00 mg/dL 1.03(H) 1.01(H) 0.95  Sodium 135 - 145 mmol/L 139 137 138  Potassium 3.5 - 5.1 mmol/L 4.0 4.3 4.0  Chloride 98 - 111 mmol/L 102 104 105  CO2 22 - 32 mmol/L 27 24 26   Calcium 8.9 - 10.3 mg/dL 9.2 9.6 8.9  Total Protein 6.5 - 8.1 g/dL 8.0 7.7 7.1  Total Bilirubin 0.3 - 1.2 mg/dL 0.6 0.9 0.5  Alkaline Phos 38 - 126 U/L 28(L) 26(L) 43  AST 15 - 41 U/L 20 18 21   ALT 0 - 44 U/L 22 16 20     All questions were answered to patient's stated satisfaction. Encouraged patient to call with any new concerns or questions before his next visit to the cancer center and we can certain see him  sooner, if needed.     ASSESSMENT & PLAN:  1.  Stage I HER-2 positive left breast cancer: -Left mastectomy on 08/19/2018, 1.1 cm IDC, grade 1, margins negative.  No lymph nodes were identified.  ER positive/PR positive/HER 2 3+. -12 doses of weekly paclitaxel and Herceptin from 10/22/2018 through 01/14/2019. -Herceptin every 3 weeks started on 02/03/2019 her last dose was on 10/21/2019. -Anastrozole started in December 2019, made her confused and forgetful. -Switched to [place] on 02/03/2019, which is causing vaginal irritation that has worsened over the past 6 months.  -Per up-to-date, Femara can cause vaginal irritation and vaginal hemorrhage (5%).  Patient states symptoms have become so bad she is unable to sleep. -Can switch patient to Aromasin to see if symptoms improve. -She will need to return to clinic in about 6 to 8 weeks to check labs and assess toleration. -Labs done on 11/122021 were all WNL  2.  Osteopenia: -Last DEXA scan on 02/08/2018 showed a T score of -1.2. -She is on Prolia every 6 months with Dr. Melina Copa. -She will continue calcium and vitamin D supplements daily. -Patient needs repeat DEXA within the next 6 months. -  Orders placed.  3.  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 -She denies any bleeding episodes.  4.  Ulcerative colitis: -She is on Humira for the past few years.  She is also taking oral mesalamine. -She is following up with Dr. Laural Golden  Disposition: RTC in 6 to 8 weeks for lab work (CBC, CMP) and to assess tolerance of Aromasin. Return to clinic in 4 months with lab work and assessment.  Patient also have a scan completed prior.  Greater than 50% was spent in counseling and coordination of care with this patient including but not limited to discussion of the relevant topics above (See A&P) including, but not limited to diagnosis and management of acute and chronic medical conditions.   Orders placed this  encounter:  No orders of the defined types were placed in this encounter.  Faythe Casa, NP 07/06/2020 2:49 PM  [place] [phone]

## 2020-08-23 ENCOUNTER — Inpatient Hospital Stay (HOSPITAL_COMMUNITY): Payer: Medicare PPO | Attending: Hematology

## 2020-08-23 ENCOUNTER — Other Ambulatory Visit: Payer: Self-pay

## 2020-08-23 DIAGNOSIS — Z170 Estrogen receptor positive status [ER+]: Secondary | ICD-10-CM | POA: Diagnosis not present

## 2020-08-23 DIAGNOSIS — C50212 Malignant neoplasm of upper-inner quadrant of left female breast: Secondary | ICD-10-CM | POA: Diagnosis present

## 2020-08-23 LAB — CBC WITH DIFFERENTIAL/PLATELET
Abs Immature Granulocytes: 0.01 10*3/uL (ref 0.00–0.07)
Basophils Absolute: 0 10*3/uL (ref 0.0–0.1)
Basophils Relative: 1 %
Eosinophils Absolute: 0.2 10*3/uL (ref 0.0–0.5)
Eosinophils Relative: 3 %
HCT: 45.3 % (ref 36.0–46.0)
Hemoglobin: 15.3 g/dL — ABNORMAL HIGH (ref 12.0–15.0)
Immature Granulocytes: 0 %
Lymphocytes Relative: 29 %
Lymphs Abs: 2.3 10*3/uL (ref 0.7–4.0)
MCH: 32.8 pg (ref 26.0–34.0)
MCHC: 33.8 g/dL (ref 30.0–36.0)
MCV: 97 fL (ref 80.0–100.0)
Monocytes Absolute: 0.5 10*3/uL (ref 0.1–1.0)
Monocytes Relative: 6 %
Neutro Abs: 4.8 10*3/uL (ref 1.7–7.7)
Neutrophils Relative %: 61 %
Platelets: 192 10*3/uL (ref 150–400)
RBC: 4.67 MIL/uL (ref 3.87–5.11)
RDW: 13.2 % (ref 11.5–15.5)
WBC: 7.8 10*3/uL (ref 4.0–10.5)
nRBC: 0 % (ref 0.0–0.2)

## 2020-08-23 LAB — COMPREHENSIVE METABOLIC PANEL
ALT: 20 U/L (ref 0–44)
AST: 22 U/L (ref 15–41)
Albumin: 4.1 g/dL (ref 3.5–5.0)
Alkaline Phosphatase: 30 U/L — ABNORMAL LOW (ref 38–126)
Anion gap: 10 (ref 5–15)
BUN: 20 mg/dL (ref 8–23)
CO2: 23 mmol/L (ref 22–32)
Calcium: 9.6 mg/dL (ref 8.9–10.3)
Chloride: 106 mmol/L (ref 98–111)
Creatinine, Ser: 1.23 mg/dL — ABNORMAL HIGH (ref 0.44–1.00)
GFR, Estimated: 44 mL/min — ABNORMAL LOW (ref >60)
Glucose, Bld: 139 mg/dL — ABNORMAL HIGH (ref 70–99)
Potassium: 3.7 mmol/L (ref 3.5–5.1)
Sodium: 139 mmol/L (ref 135–145)
Total Bilirubin: 0.6 mg/dL (ref 0.3–1.2)
Total Protein: 7.7 g/dL (ref 6.5–8.1)

## 2020-08-23 LAB — LACTATE DEHYDROGENASE: LDH: 150 U/L (ref 98–192)

## 2020-08-28 ENCOUNTER — Inpatient Hospital Stay (HOSPITAL_BASED_OUTPATIENT_CLINIC_OR_DEPARTMENT_OTHER): Payer: Medicare PPO | Admitting: Hematology

## 2020-08-28 DIAGNOSIS — C50212 Malignant neoplasm of upper-inner quadrant of left female breast: Secondary | ICD-10-CM | POA: Diagnosis not present

## 2020-08-28 DIAGNOSIS — Z170 Estrogen receptor positive status [ER+]: Secondary | ICD-10-CM

## 2020-08-28 NOTE — Progress Notes (Signed)
Virtual Visit via Telephone Note  I connected with Monique Day on 08/28/20 at  4:00 PM EST by telephone and verified that I am speaking with the correct person using two identifiers.  Location: Patient: At home Provider: In the office   I discussed the limitations, risks, security and privacy concerns of performing an evaluation and management service by telephone and the availability of in person appointments. I also discussed with the patient that there may be a patient responsible charge related to this service. The patient expressed understanding and agreed to proceed.   History of Present Illness: She is seen in our clinic for stage I HER2 positive left breast cancer for which she underwent left mastectomy on 08/19/2018.  She completed 12 doses of weekly paclitaxel in May 2020 followed by 1 year of Herceptin last dose on 10/21/2019.  She was started on anastrozole in December 2019, switched to Femara on 02/03/2019.  Femara was intern switched to Aromasin on 07/07/2020 secondary to vaginal dryness.   Observations/Objective: She reports that vaginal dryness has not improved.  She denies any new onset pains.  She does not report any other side effects from Aromasin.  Numbness which is intermittent in the left hand is stable.  Assessment and Plan:  1.  Stage I HER2 positive left breast cancer: - Continue Aromasin for a total of 5 years from December 2019. - Reviewed labs which are grossly within normal limits. - Recommended vaginal moisturizers and lubricants in the form of Replens, Feminease, Astroglide etc. - RTC 4 months with labs.  2.  Osteopenia: - Last DEXA scan on 02/08/2018 with T score of -1.2. - She has been receiving Prolia every 6 months.  She is also taking calcium and vitamin D. - Patient would like to wait to repeat DEXA scan until she completes aromatase inhibitor. - We will check vitamin D levels at next visit.  3.  Recurrent DVTs: - Continue Eliquis without any  problems.  4.  Ulcerative colitis: - She has been on Humira and takes oral mesalamine.  She has been in remission for the past 4 years.   Follow Up Instructions:   RTC 4 months with repeat labs and physical exam. I discussed the assessment and treatment plan with the patient. The patient was provided an opportunity to ask questions and all were answered. The patient agreed with the plan and demonstrated an understanding of the instructions.   The patient was advised to call back or seek an in-person evaluation if the symptoms worsen or if the condition fails to improve as anticipated.  I provided 12 minutes of non-face-to-face time during this encounter.   Derek Jack, MD

## 2020-08-28 NOTE — Progress Notes (Signed)
Pt is taking Aromasin as prescribed but still having vaginal itching.  Pt was taken off Femara due to the itching but she has not seen much improvement since she has been taking the Aromasin.  Dr. Delton Coombes notified.

## 2020-08-29 ENCOUNTER — Telehealth (HOSPITAL_COMMUNITY): Payer: Medicare PPO | Admitting: Hematology

## 2020-08-30 IMAGING — CR DG CHEST 1V PORT
1 series · 1 of 1 positions shown · non-contrast
Comparison: CT chest 02/25/2018. Single-view of the chest
02/25/2018.

CLINICAL DATA: Status post Port-A-Cath insertion today.

EXAM:
PORTABLE CHEST 1 VIEW

[portable]
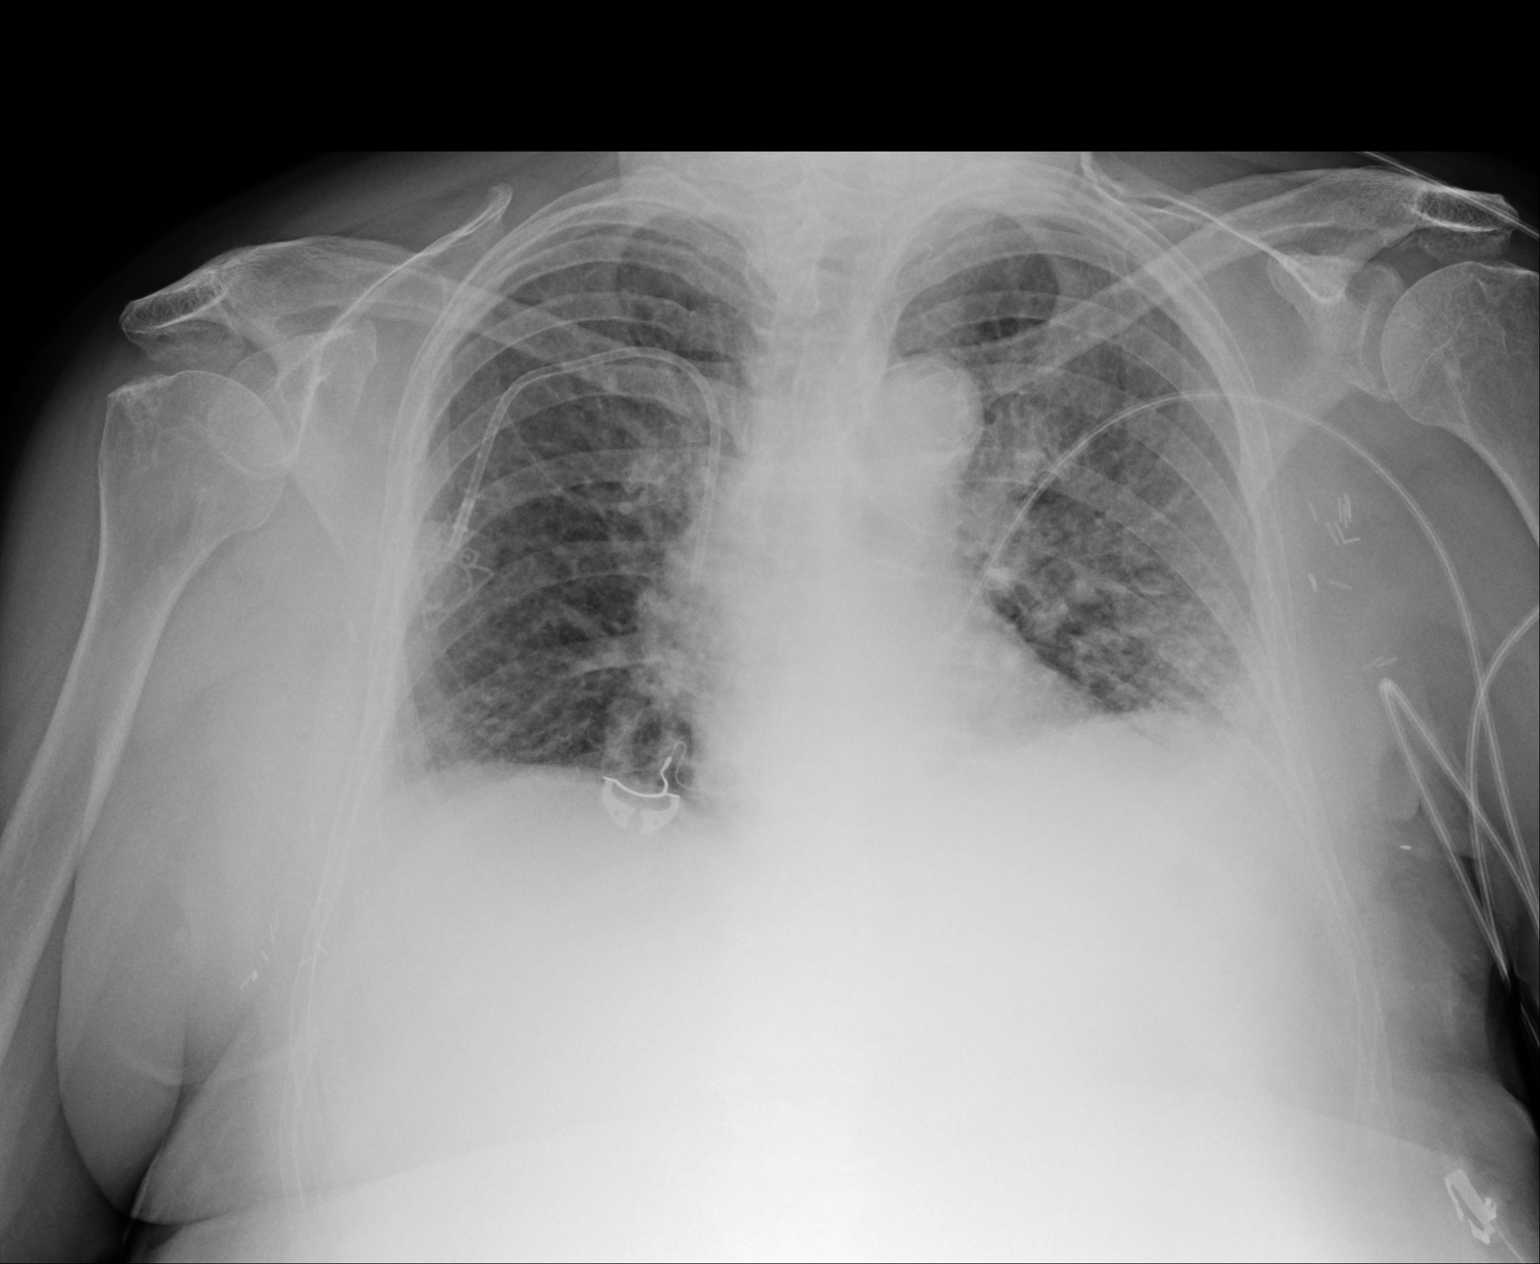

[1 of 1 positions shown; findings below may reference images not displayed]

FINDINGS: Tip of new right subclavian Port-A-Cath projects in the in place mid
to lower superior vena cava. Lung volumes are low with scattered
atelectasis. No pneumothorax. Heart size is normal. The patient is
status post bilateral axillary dissection.
IMPRESSION: Port-A-Cath tip projects in the lower superior vena cava. Negative
for pneumothorax.

## 2020-10-02 ENCOUNTER — Ambulatory Visit (INDEPENDENT_AMBULATORY_CARE_PROVIDER_SITE_OTHER): Payer: Medicare PPO | Admitting: Internal Medicine

## 2020-10-02 ENCOUNTER — Other Ambulatory Visit: Payer: Self-pay

## 2020-10-02 ENCOUNTER — Encounter (INDEPENDENT_AMBULATORY_CARE_PROVIDER_SITE_OTHER): Payer: Self-pay | Admitting: Internal Medicine

## 2020-10-02 VITALS — BP 130/76 | HR 89 | Temp 97.5°F | Ht 63.0 in | Wt 175.8 lb

## 2020-10-02 DIAGNOSIS — K5100 Ulcerative (chronic) pancolitis without complications: Secondary | ICD-10-CM | POA: Diagnosis not present

## 2020-10-02 NOTE — Progress Notes (Signed)
Presenting complaint;  Follow-up for ulcerative colitis  Database and subjective:  Last OV 03/27/2020. Patient is 85 year old Caucasian female who has chronic ulcerative colitis which was diagnosed when she was in college over 50 years ago. Back in 2013 she was begun on 6-MP as her disease could not be controlled with oral mesalamine.  She developed leukopenia afterwards 15 doses.  She was then begun on infliximab which was in December 2013.  She received last dose of Remicade in December 2014. She was treated with Remicade/infliximab infusion in 2013 and 2014. While on oral mesalamine she developed mild to moderate symptoms of relapse in August 2015 She was begun on Humira/adalimumab on 04/06/2014 and has stayed on it.  Last surveillance colonoscopy was in November 2017 revealing endoscopic remission.  She had sigmoid colon polyp which turned out to be polypoidal fragments of normal colonic mucosa.  She underwent flexible sigmoidoscopy in August 2020 for rectal bleeding.  She was noted to have active disease in the rectum.  She was treated with topical mesalamine with improvement.  She says she is doing well.  Her appetite is very good.  She has gained 3 pounds since her last visit 6 months ago.  She generally has 1 soft stool daily.  She denies melena or rectal bleeding urgency or accidents.  She says she has been on magnesium for few years.  It was started because of leg cramps.  She still get leg cramps but not as bad. She does not do any scheduled physical activity but she stays busy.  She states she does household work by herself. She had blood work on 08/23/2020.  She is aware that her serum creatinine is elevated.  She is to have repeat blood work and 6 to 8 weeks by Mr. Renato Gails, NP   Current Medications: Outpatient Encounter Medications as of 10/02/2020  Medication Sig  . Adalimumab (HUMIRA PEN) 40 MG/0.8ML PNKT Inject 40 mg as directed every 14 (fourteen) days. SATURDAYS.  Marland Kitchen  Ascorbic Acid (VITAMIN C) 1000 MG tablet Take 1,000 mg by mouth daily.  . Calcium Carbonate-Vitamin D 600-400 MG-UNIT tablet Take 1 tablet by mouth daily.  Marland Kitchen denosumab (PROLIA) 60 MG/ML SOLN injection Inject 60 mg into the skin every 6 (six) months. Administer in upper arm, thigh, or abdomen  . ELIQUIS 2.5 MG TABS tablet Take 1 tablet (2.5 mg total) by mouth 2 (two) times daily.  Marland Kitchen exemestane (AROMASIN) 25 MG tablet Take 1 tablet (25 mg total) by mouth daily after breakfast.  . FARXIGA 10 MG TABS tablet 10 mg daily.  . Magnesium 500 MG TABS Take 500 mg by mouth daily.  Marland Kitchen OVER THE COUNTER MEDICATION Healthy Feet and Nerves - patient states that she takes 2 by mouth in the morning and 2 by mouth at night.  Marland Kitchen PRESCRIPTION MEDICATION Apply 1 application topically 2 (two) times daily as needed (eczema). Ketoconazole-fluticasone 1:1 compounded cream   . Wheat Dextrin (BENEFIBER) CHEW Chew by mouth in the morning and at bedtime. Patient states that she started yesterday. She took 6.  . [DISCONTINUED] lidocaine-prilocaine (EMLA) cream Apply to port site 1 hour prior to appointment (Patient not taking: No sig reported)   No facility-administered encounter medications on file as of 10/02/2020.     Objective: Blood pressure 130/76, pulse 89, temperature (!) 97.5 F (36.4 C), temperature source Oral, height 5' 3"  (1.6 m), weight 175 lb 12.8 oz (79.7 kg). Patient is alert and in no acute distress. Patient is wearing a  mask Conjunctiva is pink. Sclera is nonicteric Oropharyngeal mucosa is normal. No neck masses or thyromegaly noted. Cardiac exam with regular rhythm normal S1 and S2. No murmur or gallop noted. Lungs are clear to auscultation. Abdomen she has lower abdominal scar just to the left of midline.  On palpation abdomen is soft and nontender with organomegaly or masses No LE edema or clubbing noted.  Labs/studies Results:  CBC Latest Ref Rng & Units 08/23/2020 06/29/2020 02/23/2020  WBC 4.0 -  10.5 K/uL 7.8 6.7 7.4  Hemoglobin 12.0 - 15.0 g/dL 15.3(H) 15.5(H) 14.5  Hematocrit 36.0 - 46.0 % 45.3 47.7(H) 44.1  Platelets 150 - 400 K/uL 192 188 197    CMP Latest Ref Rng & Units 08/23/2020 06/29/2020 02/23/2020  Glucose 70 - 99 mg/dL 139(H) 84 103(H)  BUN 8 - 23 mg/dL 20 16 20   Creatinine 0.44 - 1.00 mg/dL 1.23(H) 1.03(H) 1.01(H)  Sodium 135 - 145 mmol/L 139 139 137  Potassium 3.5 - 5.1 mmol/L 3.7 4.0 4.3  Chloride 98 - 111 mmol/L 106 102 104  CO2 22 - 32 mmol/L 23 27 24   Calcium 8.9 - 10.3 mg/dL 9.6 9.2 9.6  Total Protein 6.5 - 8.1 g/dL 7.7 8.0 7.7  Total Bilirubin 0.3 - 1.2 mg/dL 0.6 0.6 0.9  Alkaline Phos 38 - 126 U/L 30(L) 28(L) 26(L)  AST 15 - 41 U/L 22 20 18   ALT 0 - 44 U/L 20 22 16     Hepatic Function Latest Ref Rng & Units 08/23/2020 06/29/2020 02/23/2020  Total Protein 6.5 - 8.1 g/dL 7.7 8.0 7.7  Albumin 3.5 - 5.0 g/dL 4.1 4.2 4.1  AST 15 - 41 U/L 22 20 18   ALT 0 - 44 U/L 20 22 16   Alk Phosphatase 38 - 126 U/L 30(L) 28(L) 26(L)  Total Bilirubin 0.3 - 1.2 mg/dL 0.6 0.6 0.9    Lab Results  Component Value Date   CRP <0.5 06/19/2014     Assessment:  #1.  Pan ulcerative colitis.  She has been on Humira/adalimumab since August 2015 and has remained in remission except mild flareup limited to rectum documented on flexible sigmoidoscopy most likely related to therapy that she was receiving for breast carcinoma.  Presently she remains in remission.  She will continue biologic as long as it is working and she is not having any side effects. Last surveillance colonoscopy was in November 2017 and next one due in November 2022.  #2.  Chronic kidney disease.  Serum creatinine has been gradually creeping up since July 2021.  Dr. Georgina Quint is aware.  She is due for follow-up studies by him.  I do not believe renal dysfunction is due to biologic.    Plan:  Continue Humira/adalimumab at current dose of 40 mg subcu every 14 days. She will have serum magnesium with her next blood  work. Patient will return for office visit in 6 months. Will arrange for surveillance colonoscopy after next visit.

## 2020-10-02 NOTE — Patient Instructions (Signed)
Please call office if you have diarrhea and or rectal bleeding

## 2020-10-12 ENCOUNTER — Encounter (HOSPITAL_COMMUNITY): Payer: Self-pay

## 2020-10-12 ENCOUNTER — Inpatient Hospital Stay (HOSPITAL_COMMUNITY): Payer: Medicare PPO | Attending: Hematology

## 2020-10-12 ENCOUNTER — Other Ambulatory Visit: Payer: Self-pay

## 2020-10-12 DIAGNOSIS — Z452 Encounter for adjustment and management of vascular access device: Secondary | ICD-10-CM | POA: Diagnosis not present

## 2020-10-12 DIAGNOSIS — Z170 Estrogen receptor positive status [ER+]: Secondary | ICD-10-CM | POA: Diagnosis not present

## 2020-10-12 DIAGNOSIS — C50212 Malignant neoplasm of upper-inner quadrant of left female breast: Secondary | ICD-10-CM | POA: Diagnosis not present

## 2020-10-12 MED ORDER — HEPARIN SOD (PORK) LOCK FLUSH 100 UNIT/ML IV SOLN
500.0000 [IU] | Freq: Once | INTRAVENOUS | Status: AC
  Administered 2020-10-12: 500 [IU] via INTRAVENOUS

## 2020-10-12 MED ORDER — SODIUM CHLORIDE 0.9% FLUSH
10.0000 mL | INTRAVENOUS | Status: DC | PRN
  Administered 2020-10-12: 10 mL via INTRAVENOUS

## 2020-10-12 NOTE — Progress Notes (Signed)
Monique Day presented for Portacath access and flush.  Portacath located right chest wall accessed with  H 20 needle.  Good blood return present. Portacath flushed with 30m NS and 500U/584mHeparin and needle removed intact.  Procedure tolerated well and without incident.  Discharged ambulatory in stable condition.

## 2020-11-14 ENCOUNTER — Other Ambulatory Visit (HOSPITAL_COMMUNITY): Payer: Medicare PPO

## 2020-12-19 ENCOUNTER — Ambulatory Visit (HOSPITAL_COMMUNITY)
Admission: RE | Admit: 2020-12-19 | Discharge: 2020-12-19 | Disposition: A | Discharge status: Home / Self Care | Payer: Medicare PPO | Source: Ambulatory Visit | Attending: Oncology | Admitting: Oncology

## 2020-12-19 DIAGNOSIS — Z170 Estrogen receptor positive status [ER+]: Secondary | ICD-10-CM

## 2020-12-19 DIAGNOSIS — M85851 Other specified disorders of bone density and structure, right thigh: Secondary | ICD-10-CM | POA: Insufficient documentation

## 2020-12-19 DIAGNOSIS — Z13820 Encounter for screening for osteoporosis: Secondary | ICD-10-CM | POA: Insufficient documentation

## 2020-12-19 DIAGNOSIS — Z780 Asymptomatic menopausal state: Secondary | ICD-10-CM | POA: Diagnosis not present

## 2020-12-19 DIAGNOSIS — Z853 Personal history of malignant neoplasm of breast: Secondary | ICD-10-CM | POA: Diagnosis not present

## 2020-12-19 DIAGNOSIS — C50212 Malignant neoplasm of upper-inner quadrant of left female breast: Secondary | ICD-10-CM | POA: Insufficient documentation

## 2020-12-26 ENCOUNTER — Other Ambulatory Visit (HOSPITAL_COMMUNITY): Payer: Self-pay | Admitting: Oncology

## 2021-01-22 ENCOUNTER — Inpatient Hospital Stay (HOSPITAL_COMMUNITY): Payer: Medicare PPO | Attending: Hematology

## 2021-01-22 ENCOUNTER — Other Ambulatory Visit: Payer: Self-pay

## 2021-01-22 DIAGNOSIS — N898 Other specified noninflammatory disorders of vagina: Secondary | ICD-10-CM | POA: Diagnosis not present

## 2021-01-22 DIAGNOSIS — M7989 Other specified soft tissue disorders: Secondary | ICD-10-CM | POA: Insufficient documentation

## 2021-01-22 DIAGNOSIS — Z79899 Other long term (current) drug therapy: Secondary | ICD-10-CM | POA: Diagnosis not present

## 2021-01-22 DIAGNOSIS — R200 Anesthesia of skin: Secondary | ICD-10-CM | POA: Insufficient documentation

## 2021-01-22 DIAGNOSIS — C50212 Malignant neoplasm of upper-inner quadrant of left female breast: Secondary | ICD-10-CM | POA: Diagnosis not present

## 2021-01-22 DIAGNOSIS — Z882 Allergy status to sulfonamides status: Secondary | ICD-10-CM | POA: Insufficient documentation

## 2021-01-22 DIAGNOSIS — Z7901 Long term (current) use of anticoagulants: Secondary | ICD-10-CM | POA: Insufficient documentation

## 2021-01-22 DIAGNOSIS — Z79811 Long term (current) use of aromatase inhibitors: Secondary | ICD-10-CM | POA: Insufficient documentation

## 2021-01-22 DIAGNOSIS — Z9012 Acquired absence of left breast and nipple: Secondary | ICD-10-CM | POA: Diagnosis not present

## 2021-01-22 DIAGNOSIS — M8580 Other specified disorders of bone density and structure, unspecified site: Secondary | ICD-10-CM | POA: Diagnosis not present

## 2021-01-22 DIAGNOSIS — Z86718 Personal history of other venous thrombosis and embolism: Secondary | ICD-10-CM | POA: Insufficient documentation

## 2021-01-22 DIAGNOSIS — Z170 Estrogen receptor positive status [ER+]: Secondary | ICD-10-CM | POA: Diagnosis not present

## 2021-01-22 DIAGNOSIS — R5383 Other fatigue: Secondary | ICD-10-CM | POA: Diagnosis not present

## 2021-01-22 DIAGNOSIS — K5190 Ulcerative colitis, unspecified, without complications: Secondary | ICD-10-CM | POA: Insufficient documentation

## 2021-01-22 LAB — CBC WITH DIFFERENTIAL/PLATELET
Abs Immature Granulocytes: 0.04 10*3/uL (ref 0.00–0.07)
Basophils Absolute: 0.1 10*3/uL (ref 0.0–0.1)
Basophils Relative: 1 %
Eosinophils Absolute: 0.3 10*3/uL (ref 0.0–0.5)
Eosinophils Relative: 3 %
HCT: 45.9 % (ref 36.0–46.0)
Hemoglobin: 15.4 g/dL — ABNORMAL HIGH (ref 12.0–15.0)
Immature Granulocytes: 1 %
Lymphocytes Relative: 29 %
Lymphs Abs: 2.2 10*3/uL (ref 0.7–4.0)
MCH: 32.4 pg (ref 26.0–34.0)
MCHC: 33.6 g/dL (ref 30.0–36.0)
MCV: 96.6 fL (ref 80.0–100.0)
Monocytes Absolute: 0.5 10*3/uL (ref 0.1–1.0)
Monocytes Relative: 7 %
Neutro Abs: 4.7 10*3/uL (ref 1.7–7.7)
Neutrophils Relative %: 59 %
Platelets: 183 10*3/uL (ref 150–400)
RBC: 4.75 MIL/uL (ref 3.87–5.11)
RDW: 12.7 % (ref 11.5–15.5)
WBC: 7.7 10*3/uL (ref 4.0–10.5)
nRBC: 0 % (ref 0.0–0.2)

## 2021-01-22 LAB — COMPREHENSIVE METABOLIC PANEL
ALT: 19 U/L (ref 0–44)
AST: 21 U/L (ref 15–41)
Albumin: 4 g/dL (ref 3.5–5.0)
Alkaline Phosphatase: 29 U/L — ABNORMAL LOW (ref 38–126)
Anion gap: 6 (ref 5–15)
BUN: 20 mg/dL (ref 8–23)
CO2: 26 mmol/L (ref 22–32)
Calcium: 9 mg/dL (ref 8.9–10.3)
Chloride: 106 mmol/L (ref 98–111)
Creatinine, Ser: 1.09 mg/dL — ABNORMAL HIGH (ref 0.44–1.00)
GFR, Estimated: 51 mL/min — ABNORMAL LOW (ref >60)
Glucose, Bld: 154 mg/dL — ABNORMAL HIGH (ref 70–99)
Potassium: 3.9 mmol/L (ref 3.5–5.1)
Sodium: 138 mmol/L (ref 135–145)
Total Bilirubin: 0.6 mg/dL (ref 0.3–1.2)
Total Protein: 7.3 g/dL (ref 6.5–8.1)

## 2021-01-22 LAB — VITAMIN D 25 HYDROXY (VIT D DEFICIENCY, FRACTURES): Vit D, 25-Hydroxy: 45.58 ng/mL (ref 30–100)

## 2021-01-25 ENCOUNTER — Other Ambulatory Visit (HOSPITAL_COMMUNITY): Payer: Self-pay | Admitting: Oncology

## 2021-01-28 NOTE — Progress Notes (Signed)
[place] [address] [place] [address]   Patient Care Team: [name], NP as PCP - General (Internal Medicine)  SUMMARY OF ONCOLOGIC HISTORY: Oncology History  Breast cancer of upper-inner quadrant of left female breast ([facility] [facility])  07/21/2018 Initial Diagnosis   Breast cancer of upper-inner quadrant of left female breast ([place])    10/22/2018 -  Chemotherapy   The patient had trastuzumab (HERCEPTIN) 300 mg in sodium chloride 0.9 % 250 mL chemo infusion, 315 mg, Intravenous,  Once, 7 of 7 cycles Dose modification: 0.06 mg/kg (1 % of original dose 6 mg/kg, Cycle 6, Reason: Other (see comments), Comment: need to resign), 6 mg/kg (original dose 6 mg/kg, Cycle 6, Reason: Other (see comments), Comment: need to reenter to sign ), 2 mg/kg (original dose 6 mg/kg, Cycle 7, Reason: Other (see comments)), 6 mg/kg (original dose 6 mg/kg, Cycle 7, Reason: Other (see comments)) Administration: 300 mg (10/22/2018), 150 mg (10/29/2018), 150 mg (11/19/2018), 450 mg (02/03/2019), 450 mg (02/24/2019), 150 mg (11/05/2018), 150 mg (11/12/2018), 150 mg (12/01/2018), 150 mg (12/10/2018), 150 mg (12/17/2018), 150 mg (12/24/2018), 150 mg (12/31/2018), 150 mg (01/07/2019), 150 mg (01/14/2019), 450 mg (03/18/2019), 450 mg (04/08/2019) PACLitaxel (TAXOL) 72 mg in sodium chloride 0.9 % 150 mL chemo infusion (</= 93m/m2), 40 mg/m2 = 72 mg (50 % of original dose 80 mg/m2), Intravenous,  Once, 3 of 3 cycles Dose modification: 40 mg/m2 (50 % of original dose 80 mg/m2, Cycle 1, Reason: Provider Judgment) Administration: 72 mg (10/22/2018), 72 mg (10/29/2018), 72 mg (11/19/2018), 72 mg (11/05/2018), 72 mg (11/12/2018), 72 mg (12/01/2018), 72 mg (12/10/2018), 72 mg (12/17/2018), 72 mg (12/24/2018), 72 mg (12/31/2018), 72 mg (01/07/2019), 72 mg (01/14/2019) trastuzumab-anns (KANJINTI) 450 mg in sodium chloride 0.9 % 250 mL chemo infusion, 462 mg (100 % of original dose 6 mg/kg), Intravenous,  Once, 9 of 9 cycles Dose modification: 6 mg/kg  (original dose 6 mg/kg, Cycle 8, Reason: Provider Judgment, Comment: change to biosimilar) Administration: 450 mg (04/29/2019), 450 mg (05/20/2019), 450 mg (07/22/2019), 450 mg (08/15/2019), 450 mg (10/21/2019), 450 mg (06/10/2019), 450 mg (07/01/2019), 450 mg (09/09/2019), 450 mg (09/30/2019)   for chemotherapy treatment.       CHIEF COMPLIANT: Follow-up for breast cancer   INTERVAL HISTORY: Ms. Monique Lariveeis a 85y.o. female here today for follow up of her breast cancer. Her last visit was on 07/06/2020.   Today she reports feeling well. Today she reports pain in her side with movement.   REVIEW OF SYSTEMS:   Review of Systems  Constitutional:  Positive for fatigue (75%). Negative for appetite change.  Cardiovascular:  Positive for leg swelling (ankles).  Neurological:  Positive for numbness (feet).  All other systems reviewed and are negative.  I have reviewed the past medical history, past surgical history, social history and family history with the patient and they are unchanged from previous note.   ALLERGIES:   is allergic to mercaptopurine and sulfa antibiotics.   MEDICATIONS:  Current Outpatient Medications  Medication Sig Dispense Refill   Adalimumab (HUMIRA PEN) 40 MG/0.8ML PNKT Inject 40 mg as directed every 14 (fourteen) days. SATURDAYS. 2 each 11   Ascorbic Acid (VITAMIN C) 1000 MG tablet Take 1,000 mg by mouth daily.     Calcium Carbonate-Vitamin D 600-400 MG-UNIT tablet Take 1 tablet by mouth daily.     denosumab (PROLIA) 60 MG/ML SOLN injection Inject 60 mg into the skin every 6 (six) months. Administer in upper  arm, thigh, or abdomen     ELIQUIS 2.5 MG TABS tablet Take 1 tablet (2.5 mg total) by mouth 2 (two) times daily. 60 tablet    exemestane (AROMASIN) 25 MG tablet TAKE 1 TABLET DAILY AFTER BREAKFAST. 30 tablet 0   FARXIGA 10 MG TABS tablet 10 mg daily.     Magnesium 500 MG TABS Take 500 mg by mouth daily.     OVER THE COUNTER MEDICATION Healthy Feet and  Nerves - patient states that she takes 2 by mouth in the morning and 2 by mouth at night.     PRESCRIPTION MEDICATION Apply 1 application topically 2 (two) times daily as needed (eczema). Ketoconazole-fluticasone 1:1 compounded cream      Wheat Dextrin (BENEFIBER) CHEW Chew by mouth in the morning and at bedtime. Patient states that she started yesterday. She took 6.     No current facility-administered medications for this visit.     PHYSICAL EXAMINATION: Performance status (ECOG): 1 - Symptomatic but completely ambulatory  There were no vitals filed for this visit. Wt Readings from Last 3 Encounters:  10/02/20 175 lb 12.8 oz (79.7 kg)  07/06/20 175 lb 14.4 oz (79.8 kg)  03/27/20 172 lb 14.4 oz (78.4 kg)   Physical Exam Vitals reviewed.  Constitutional:      Appearance: Normal appearance.  Cardiovascular:     Rate and Rhythm: Normal rate and regular rhythm.     Pulses: Normal pulses.     Heart sounds: Normal heart sounds.  Pulmonary:     Effort: Pulmonary effort is normal.     Breath sounds: Normal breath sounds.  Chest:  Breasts:    Right: Absent. No skin change (mastectomy site within normal limits).     Left: Absent. No skin change (mastectomy site within normal limits).  Abdominal:     Palpations: Abdomen is soft. There is no hepatomegaly, splenomegaly or mass.     Tenderness: There is no abdominal tenderness.  Musculoskeletal:     Right lower leg: No edema.     Left lower leg: No edema.  Neurological:     General: No focal deficit present.     Mental Status: She is alert and oriented to person, place, and time.  Psychiatric:        Mood and Affect: Mood normal.        Behavior: Behavior normal.    Breast Exam Chaperone: Thana Ates     LABORATORY DATA:  I have reviewed the data as listed CMP Latest Ref Rng & Units 01/22/2021 08/23/2020 06/29/2020  Glucose 70 - 99 mg/dL 154(H) 139(H) 84  BUN 8 - 23 mg/dL _0 Creatinine 0.44 - 1.00 mg/dL 1.09(H) 1.23(H)  1.03(H)  Sodium 135 - 145 mmol/L 138 139 139  Potassium 3.5 - 5.1 mmol/L 3.9 3.7 4.0  Chloride 98 - 111 mmol/L 106 106 102  CO2 22 - 32 mmol/L _1 Calcium 8.9 - 10.3 mg/dL 9.0 9.6 9.2  Total Protein 6.5 - 8.1 g/dL 7.3 7.7 8.0  Total Bilirubin 0.3 - 1.2 mg/dL 0.6 0.6 0.6  Alkaline Phos 38 - 126 U/L 29(L) 30(L) 28(L)  AST 15 - 41 U/L _2 ALT 0 - 44 U/L _3 No results found for: [id number] Lab Results  Component Value Date   WBC 7.7 01/22/2021   HGB 15.4 (H) 01/22/2021   HCT 45.9 01/22/2021   MCV 96.6 01/22/2021   PLT 183 01/22/2021  NEUTROABS 4.7 01/22/2021    ASSESSMENT:  1.  Stage I HER-2 positive left breast cancer: -Left mastectomy on 08/19/2018, 1.1 cm IDC, grade 1, margins negative.  No lymph nodes were identified.  ER positive/PR positive/HER 2 3+. -12 doses of weekly paclitaxel and Herceptin from 10/22/2018 through 01/14/2019. -Herceptin every 3 weeks started on 02/03/2019 her last dose was on 10/21/2019. -Anastrozole started in December 2019, made her confused and forgetful. -Switched to Femara on 02/03/2019, which is causing vaginal irritation that has worsened over the past 6 months.   2.  Osteopenia: -Last DEXA scan on 02/08/2018 showed a T score of -1.2. -She is on Prolia every 6 months with Dr. Melina Copa.    3.  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 -She denies any bleeding episodes.   4.  Ulcerative colitis: -She is on Humira for the past few years.  She is also taking oral mesalamine. -She is following up with Dr. Laural Golden   PLAN:  1.  Stage I HER-2 positive left breast cancer: - She is tolerating letrozole reasonably well. - Physical examination today did not reveal any palpable masses in bilateral mastectomy sites. - Reviewed labs from 01/22/2021 which showed normal LFTs and CBC.  Vitamin D level was normal. - Recommend follow-up in 6 months with repeat labs and physical exam.    2.  Osteopenia: - DEXA  scan on 12/19/2020 with T score -1.3. - Continue calcium and vitamin D supplements.    3.  Recurrent DVTs: - Continue Eliquis.  No bleeding issues.     Breast Cancer therapy associated bone loss: I have recommended calcium, Vitamin D and weight bearing exercises.  Orders placed this encounter:  No orders of the defined types were placed in this encounter.   The patient has a good understanding of the overall plan. She agrees with it. She will call with any problems that may develop before the next visit here.  Derek Jack, MD [place] [phone]   I, Thana Ates, am acting as a scribe for Dr. Derek Jack.  I, Derek Jack MD, have reviewed the above documentation for accuracy and completeness, and I agree with the above.

## 2021-01-29 ENCOUNTER — Other Ambulatory Visit: Payer: Self-pay

## 2021-01-29 ENCOUNTER — Inpatient Hospital Stay (HOSPITAL_BASED_OUTPATIENT_CLINIC_OR_DEPARTMENT_OTHER): Payer: Medicare PPO | Admitting: Hematology

## 2021-01-29 VITALS — BP 140/65 | HR 84 | Temp 97.0°F | Resp 18 | Wt 173.6 lb

## 2021-01-29 DIAGNOSIS — C50212 Malignant neoplasm of upper-inner quadrant of left female breast: Secondary | ICD-10-CM

## 2021-01-29 DIAGNOSIS — Z170 Estrogen receptor positive status [ER+]: Secondary | ICD-10-CM | POA: Diagnosis not present

## 2021-01-29 NOTE — Patient Instructions (Signed)
[place] at [facility] Discharge Instructions  You were seen today by Dr. Delton Coombes. He went over your recent results. Dr. Delton Coombes will see you back in 6 months for labs and follow up.   Thank you for choosing [place] at [facility] to provide your oncology and hematology care.  To afford each patient quality time with our provider, please arrive at least 15 minutes before your scheduled appointment time.   If you have a lab appointment with the [facility] please come in thru the Main Entrance and check in at the main information desk  You need to re-schedule your appointment should you arrive 10 or more minutes late.  We strive to give you quality time with our providers, and arriving late affects you and other patients whose appointments are after yours.  Also, if you no show three or more times for appointments you may be dismissed from the clinic at the providers discretion.     Again, thank you for choosing [facility].  Our hope is that these requests will decrease the amount of time that you wait before being seen by our physicians.       _____________________________________________________________  Should you have questions after your visit to [facility], please contact our office at [phone] between the hours of 8:00 a.m. and 4:30 p.m.  Voicemails left after 4:00 p.m. will not be returned until the following business day.  For prescription refill requests, have your pharmacy contact our office and allow 72 hours.    Cancer Center Support Programs:   > Cancer Support Group  2nd Tuesday of the month 1pm-2pm, [facility]

## 2021-01-29 NOTE — Progress Notes (Signed)
[place] Client presented for Portacath access and flush.  Portacath located Right  chest wall accessed with  H 20 needle.  No blood return, however flushed very easily.  Portacath flushed with 36m NS and 500U/531mHeparin and needle removed intact.  Procedure tolerated well and without incident.

## 2021-02-21 ENCOUNTER — Other Ambulatory Visit (HOSPITAL_COMMUNITY): Payer: Self-pay | Admitting: Oncology

## 2021-03-05 ENCOUNTER — Other Ambulatory Visit (HOSPITAL_COMMUNITY): Payer: Self-pay

## 2021-03-05 MED ORDER — EXEMESTANE 25 MG PO TABS: ORAL_TABLET | ORAL | 0 refills | Status: DC

## 2021-03-11 ENCOUNTER — Encounter (HOSPITAL_COMMUNITY): Payer: Self-pay | Admitting: Hematology

## 2021-04-02 ENCOUNTER — Encounter (INDEPENDENT_AMBULATORY_CARE_PROVIDER_SITE_OTHER): Payer: Self-pay

## 2021-04-02 ENCOUNTER — Other Ambulatory Visit: Payer: Self-pay

## 2021-04-02 ENCOUNTER — Encounter (INDEPENDENT_AMBULATORY_CARE_PROVIDER_SITE_OTHER): Payer: Self-pay | Admitting: Gastroenterology

## 2021-04-02 ENCOUNTER — Ambulatory Visit (INDEPENDENT_AMBULATORY_CARE_PROVIDER_SITE_OTHER): Payer: Medicare PPO | Admitting: Internal Medicine

## 2021-04-02 ENCOUNTER — Ambulatory Visit (INDEPENDENT_AMBULATORY_CARE_PROVIDER_SITE_OTHER): Payer: Medicare PPO | Admitting: Gastroenterology

## 2021-04-02 ENCOUNTER — Other Ambulatory Visit (INDEPENDENT_AMBULATORY_CARE_PROVIDER_SITE_OTHER): Payer: Self-pay

## 2021-04-02 ENCOUNTER — Telehealth (INDEPENDENT_AMBULATORY_CARE_PROVIDER_SITE_OTHER): Payer: Self-pay

## 2021-04-02 VITALS — BP 125/72 | HR 88 | Temp 98.5°F | Ht 63.0 in | Wt 169.0 lb

## 2021-04-02 DIAGNOSIS — K5900 Constipation, unspecified: Secondary | ICD-10-CM | POA: Insufficient documentation

## 2021-04-02 DIAGNOSIS — K5100 Ulcerative (chronic) pancolitis without complications: Secondary | ICD-10-CM

## 2021-04-02 MED ORDER — NA SULFATE-K SULFATE-MG SULF 17.5-3.13-1.6 GM/177ML PO SOLN: 1.0000 | ORAL | 0 refills | Status: DC

## 2021-04-02 NOTE — Progress Notes (Signed)
Referring Provider: [name], NP Primary Care Physician:  [name], NP Primary GI Physician: Rehman   Chief Complaint  Patient presents with   Follow-up    6 month follow up. Has one BM daily but hard for it come out. States appetite is good.    HPI:   Monique Day is a 85 y.o. female with past medical history of breast cancer, DVT, seizures, stroke, osteoporosis and UC.   Patient presenting today for follow up of Ulcerative Colitis.   UC: Patient was diagnosed with UC over 50 years ago, when she was in college. In 2013, she started on 6-MP as her disease was not controlled with mesalamine. She developed leukopenia after 15 doses and was then started on Remicade (dec 2013-dec 2014). Mesalamine in aug 2015, but developed relapse of disease and was started on Humira on 04/06/14. She does humira 60m SQ every 2 weeks.   Patient reports she is doing well. She is not having any episodes of rectal bleeding, abdominal pain, cramping, or diarrhea. She reports that she has had some episodes lately where it feels like she has to strain to get her stool evacuated. She recently increased her benefiber to 9g per day up from 6g and feels this has helped.   Extraintestinal Manifestations: Skin:some seborrheic keratosis, no rashes.  Joints: some mild joint [place] better since being home and resting and having her prolia injection Eyes:no visual changes, sees ophthalmologist yearly  Last Colonoscopy:(Nov 2017) Flex sig (04/14/19): r/t rectal bleeding, had active disease in rectum, treated with topical mesalamine Last Endoscopy:n/a  Recommendations:  Surveillance colonoscopy nov 2022  Past Medical History:  Diagnosis Date   Adult idiopathic generalized osteoporosis    Arthritis    "minor" (08/18/2018)   Breast cancer, left breast ([place] 1998   lumpectomy   Breast cancer, right breast ([facility] 2009   lumpectomy;  mastectomy   [place] 2000's"   RLE   [place]    Hemorrhoids, external    History of blood transfusion    "related to ulcerative colitis"   Lower abdominal pain    Occult blood in stools    Peripheral edema    [place]    one very slight seizure with the stroke; somewhere between 2005-2009   Stroke ([facility] 2005-2009   "very light;" ; denies residual on 08/19/2018   UC (ulcerative colitis) ([place] 11610   Past Surgical History:  Procedure Laterality Date   APPENDECTOMY     BIOPSY  04/14/2019   Procedure: BIOPSY;  Surgeon: RRogene Houston MD;  [facility];  Service: Endoscopy;;   bone density  12/11/10   BREAST BIOPSY Left 1998; 2019 X 2   BREAST BIOPSY Right 2009   BREAST LUMPECTOMY Left 1998   CATARACT EXTRACTION W/ INTRAOCULAR LENS  IMPLANT, BILATERAL Bilateral    COLONOSCOPY  02/21/2011   COLONOSCOPY  10/06/2008   COLONOSCOPY  12/27/01   COLONOSCOPY  05/09/05   COLONOSCOPY N/A 07/02/2016   Procedure: COLONOSCOPY;  Surgeon: [name] MD;  Location[facility];  Service: Endoscopy;  Laterality: N/A;  [place]  "I was having rectal bleeding"   FLEXIBLE SIGMOIDOSCOPY N/A 04/14/2019   Procedure: FLEXIBLE SIGMOIDOSCOPY;  Surgeon: RRogene Houston MD;  [facility];  Service: Endoscopy;  Laterality: N/A;  1:00   FRACTURE SURGERY     MASTECTOMY Right 2009   MASTECTOMY COMPLETE / SIMPLE Left 08/19/2018   PORTACATH PLACEMENT Right 10/15/2018   Procedure: INSERTION PORT-A-CATH RIGHT SUBCLAVIN;  Surgeon: [name], MD;  Location: [facility] ORS;  Service: General;  Laterality: Right;  pt knows to arrive at 7:30   [place] Left 08/19/2018   Procedure: LEFT SIMPLE MASTECTOMY;  Surgeon: [name], MD;  Location: [facility];  Service: General;  Laterality: Left;   TONSILLECTOMY     VENA CAVA FILTER PLACEMENT Right    WRIST  FRACTURE SURGERY Right     Current Outpatient Medications  Medication Sig Dispense Refill   Adalimumab (HUMIRA PEN) 40 MG/0.8ML PNKT Inject 40 mg as directed every 14 (fourteen) days. SATURDAYS. 2 each 11   Ascorbic Acid (VITAMIN C) 1000 MG tablet Take 1,000 mg by mouth daily.     Calcium Carbonate-Vitamin D 600-400 MG-UNIT tablet Take 1 tablet by mouth daily.     denosumab (PROLIA) 60 MG/ML SOLN injection Inject 60 mg into the skin every 6 (six) months. Administer in upper arm, thigh, or abdomen     ELIQUIS 2.5 MG TABS tablet Take 1 tablet (2.5 mg total) by mouth 2 (two) times daily. 60 tablet    exemestane (AROMASIN) 25 MG tablet TAKE 1 TABLET DAILY AFTER BREAKFAST. 30 tablet 6   exemestane (AROMASIN) 25 MG tablet TAKE 1 TABLET DAILY AFTER BREAKFAST. 30 tablet 0   FARXIGA 10 MG TABS tablet 10 mg daily.     Magnesium 500 MG TABS Take 500 mg by mouth daily.     OVER THE COUNTER MEDICATION Healthy Feet and Nerves - patient states that she takes 2 by mouth in the morning and 2 by mouth at night.     PRESCRIPTION MEDICATION Apply 1 application topically 2 (two) times daily as needed (eczema). Ketoconazole-fluticasone 1:1 compounded cream      Wheat Dextrin (BENEFIBER) CHEW Chew by mouth in the morning and at bedtime. Patient states that she started yesterday. She took 6.     No current facility-administered medications for this visit.    Allergies as of 04/02/2021 - Review Complete 04/02/2021  Allergen Reaction Noted   Mercaptopurine Other (See Comments) 07/26/2012   Sulfa antibiotics Other (See Comments) 03/27/2011    Family History  Problem Relation Age of Onset   Prostate cancer Father    Colon cancer Neg Hx     Social History   Socioeconomic History   Marital status: Widowed    Spouse name: Not on file   Number of children: 0   Years of education: Not on file   Highest education level: Not on file  Occupational History   Occupation: Psychologist, prison and probation services    Employer: [employer]  Tobacco Use   Smoking status: Former    Packs/day: 2.00    Years: 40.00    Pack years: 80.00    Types: Cigarettes    Quit date: 05/05/2001    Years since quitting: 19.9   Smokeless tobacco: Never  Vaping Use   Vaping Use: Never used  Substance and Sexual Activity   Alcohol use: Yes    Comment: 08/18/2018 "couple drinks/year"   Drug use: Never   Sexual activity: Not Currently  Other Topics Concern   Not on file  Social History Narrative   Not on file   Social Determinants of Health   Financial Resource Strain: Not on file  Food Insecurity:  Not on file  Transportation Needs: Not on file  Physical Activity: Not on file  Stress: Not on file  Social Connections: Not on file   Review of Systems: Gen: Denies fever, chills, anorexia. Denies fatigue, weakness, weight loss.  CV: Denies chest pain, palpitations, syncope, peripheral edema, and claudication. Resp: Denies dyspnea at rest, cough, wheezing, coughing up blood, and pleurisy. GI: Denies vomiting blood, jaundice, and fecal incontinence. Denies dysphagia or odynophagia. Endorses some difficulty evacuating stools efficiently at times. Derm: Denies rash, itching, dry skin Psych: Denies depression, anxiety, memory loss, confusion. No homicidal or suicidal ideation.  Heme: Denies bruising, bleeding, and enlarged lymph nodes.  Physical Exam: BP 125/72 (BP Location: Right Arm, Patient Position: Sitting, Cuff Size: Large)   Pulse 88   Temp 98.5 F (36.9 C) (Oral)   Ht 5' 3"  (1.6 m)   Wt 169 lb (76.7 kg)   BMI 29.94 kg/m  General:   Alert and oriented. No distress noted. Pleasant and cooperative.  Head:  Normocephalic and atraumatic. Eyes:  Conjuctiva clear without scleral icterus. Mouth:  Oral mucosa pink and moist. Good dentition. No lesions. Heart: Normal rate and rhythm, s1 and s2 heart sounds present.  Lungs: Clear lung sounds in all lobes. Respirations equal and unlabored. Abdomen:  +BS, soft,  non-tender and non-distended. No rebound or guarding. No HSM or masses noted. Derm: No palmar erythema or jaundice Msk:  Symmetrical without gross deformities. Normal posture. Extremities:  Without edema. Neurologic:  Alert and  oriented x4 Psych:  Alert and cooperative. Normal mood and affect.  ASSESSMENT: Monique Day is a 85 y.o. female presenting today for follow up of Ulcerative Colitis.   Most recent labs in June 2022, CBC and CMP both WNL. Patient is doing well overall with her UC. Humira 80m SQ every 14 days. She denies abdominal pain, cramping, diarrhea, blood in stools or weight loss. Will continue with current regimen and repeat surveillance colonoscopy in November.  Patient does endorse some recent difficulty evacuating her stools and feels as though she has to strain to get them out. She increased her benefiber from 6g to 9g a few days ago and reports that she feels this has helped her to have BMs without needing to strain. She will continue with this and inquired about using glycerin suppository as needed if she feels like she is having to strain which we discussed is okay to do as needed. If constipation becomes worse and benefiber and suppository regimen are not providing relief, she should call [place] that we can discuss a new plan of care to provide relief of constipation.   PLAN:  Continue Humira 487mSQ every 14 days 2. Can continue benefiber, can add glycerin suppositories as needed, pt to give [place] call if this regimen does not provide relief of constipation 3. Plan for surveillance colonoscopy in nov 2022  Follow Up: 6 months  This case was reviewed with Dr. ReLaural Goldenwho is in agreement with plan of care as outlined above.   Brentyn Seehafer L. CaAlver SorrowMSN, APRN, AGNP-C Adult-Gerontology Nurse Practitioner [facility]

## 2021-04-02 NOTE — Patient Instructions (Signed)
Continue humira SQ every 14 days.  We will plan for colonoscopy in November.  You can keep doing benefiber daily and using glycerin suppositories as needed for constipation, please let us know if this is not providing relief and we can develop a new plan of care to help with your constipation.  Follow up 6 months.

## 2021-04-02 NOTE — Telephone Encounter (Signed)
LeighAnn Cola [place]fill, CMA  

## 2021-04-04 ENCOUNTER — Other Ambulatory Visit (INDEPENDENT_AMBULATORY_CARE_PROVIDER_SITE_OTHER): Payer: Self-pay

## 2021-04-04 ENCOUNTER — Other Ambulatory Visit (HOSPITAL_COMMUNITY): Payer: Self-pay | Admitting: Physician Assistant

## 2021-04-05 ENCOUNTER — Other Ambulatory Visit (INDEPENDENT_AMBULATORY_CARE_PROVIDER_SITE_OTHER): Payer: Self-pay | Admitting: Internal Medicine

## 2021-04-24 ENCOUNTER — Inpatient Hospital Stay (HOSPITAL_COMMUNITY): Payer: Medicare PPO | Attending: Hematology

## 2021-04-24 VITALS — BP 139/75 | HR 72 | Temp 96.9°F | Resp 17

## 2021-04-24 DIAGNOSIS — Z95828 Presence of other vascular implants and grafts: Secondary | ICD-10-CM

## 2021-04-24 DIAGNOSIS — Z452 Encounter for adjustment and management of vascular access device: Secondary | ICD-10-CM | POA: Insufficient documentation

## 2021-04-24 DIAGNOSIS — C50212 Malignant neoplasm of upper-inner quadrant of left female breast: Secondary | ICD-10-CM | POA: Diagnosis present

## 2021-04-24 DIAGNOSIS — Z170 Estrogen receptor positive status [ER+]: Secondary | ICD-10-CM | POA: Insufficient documentation

## 2021-04-24 MED ORDER — SODIUM CHLORIDE 0.9% FLUSH
10.0000 mL | INTRAVENOUS | Status: DC | PRN
  Administered 2021-04-24: 10 mL via INTRAVENOUS

## 2021-04-24 MED ORDER — HEPARIN SOD (PORK) LOCK FLUSH 100 UNIT/ML IV SOLN
500.0000 [IU] | Freq: Once | INTRAVENOUS | Status: AC
  Administered 2021-04-24: 500 [IU] via INTRAVENOUS

## 2021-04-24 NOTE — Progress Notes (Signed)
Patients port flushed without difficulty.  Good blood return noted with no bruising or swelling noted at site.  Band aid applied.  VSS with discharge and left in satisfactory condition with no s/s of distress noted.

## 2021-04-24 NOTE — Patient Instructions (Signed)
[facility]  Discharge Instructions: Thank you for choosing [facility] to provide your oncology and hematology care.  If you have a lab appointment with the [facility], please come in thru the Main Entrance and check in at the main information desk.  We strive to give you quality time with your provider. You may need to reschedule your appointment if you arrive late (15 or more minutes).  Arriving late affects you and other patients whose appointments are after yours.  Also, if you miss three or more appointments without notifying the office, you may be dismissed from the clinic at the provider's discretion.      For prescription refill requests, have your pharmacy contact our office and allow 72 hours for refills to be completed.     To help prevent nausea and vomiting after your treatment, we encourage you to take your nausea medication as directed.  BELOW ARE SYMPTOMS THAT SHOULD BE REPORTED IMMEDIATELY: *FEVER GREATER THAN 100.4 F (38 C) OR HIGHER *CHILLS OR SWEATING *NAUSEA AND VOMITING THAT IS NOT CONTROLLED WITH YOUR NAUSEA MEDICATION *UNUSUAL SHORTNESS OF BREATH *UNUSUAL BRUISING OR BLEEDING *URINARY PROBLEMS (pain or burning when urinating, or frequent urination) *BOWEL PROBLEMS (unusual diarrhea, constipation, pain near the anus) TENDERNESS IN MOUTH AND THROAT WITH OR WITHOUT PRESENCE OF ULCERS (sore throat, sores in mouth, or a toothache) UNUSUAL RASH, SWELLING OR PAIN  UNUSUAL VAGINAL DISCHARGE OR ITCHING   Items with * indicate a potential emergency and should be followed up as soon as possible or go to the Emergency Department if any problems should occur.  Should you have questions after your visit or need to cancel or reschedule your appointment, please contact [facility] [phone]  and follow the prompts.  Office hours are 8:00 a.m. to 4:30 p.m. Monday - Friday. Please note that voicemails left after 4:00 p.m. may not be  returned until the following business day.  We are closed weekends and major holidays. You have access to a nurse at all times for urgent questions. Please call the main number to the clinic [phone] and follow the prompts.  For any non-urgent questions, you may also contact your provider using MyChart. We now offer e-Visits for anyone 64 and older to request care online for non-urgent symptoms. For details visit [email or web address].   Also download the MyChart app! Go to the app store, search "MyChart", open the app, select [facility], and log in with your MyChart username and password.  Due to Covid, a mask is required upon entering the hospital/clinic. If you do not have a mask, one will be given to you upon arrival. For doctor visits, patients may have 1 support person aged 18 or older with them. For treatment visits, patients cannot have anyone with them due to current Covid guidelines and our immunocompromised population.

## 2021-06-12 ENCOUNTER — Encounter (INDEPENDENT_AMBULATORY_CARE_PROVIDER_SITE_OTHER): Payer: Self-pay | Admitting: *Deleted

## 2021-07-04 ENCOUNTER — Ambulatory Visit (HOSPITAL_COMMUNITY)
Admission: RE | Admit: 2021-07-04 | Discharge: 2021-07-04 | Disposition: A | Discharge status: Home / Self Care | Payer: Medicare PPO | Source: Ambulatory Visit | Attending: Internal Medicine | Admitting: Internal Medicine

## 2021-07-04 ENCOUNTER — Other Ambulatory Visit: Payer: Self-pay

## 2021-07-04 ENCOUNTER — Encounter (HOSPITAL_COMMUNITY)
Admission: RE | Disposition: A | Discharge status: Home / Self Care | Payer: Self-pay | Source: Ambulatory Visit | Attending: Internal Medicine

## 2021-07-04 ENCOUNTER — Encounter (HOSPITAL_COMMUNITY): Payer: Self-pay | Admitting: Internal Medicine

## 2021-07-04 DIAGNOSIS — K621 Rectal polyp: Secondary | ICD-10-CM | POA: Insufficient documentation

## 2021-07-04 DIAGNOSIS — K6289 Other specified diseases of anus and rectum: Secondary | ICD-10-CM

## 2021-07-04 DIAGNOSIS — K6389 Other specified diseases of intestine: Secondary | ICD-10-CM | POA: Diagnosis not present

## 2021-07-04 DIAGNOSIS — K5100 Ulcerative (chronic) pancolitis without complications: Secondary | ICD-10-CM | POA: Diagnosis not present

## 2021-07-04 DIAGNOSIS — Z853 Personal history of malignant neoplasm of breast: Secondary | ICD-10-CM | POA: Diagnosis not present

## 2021-07-04 DIAGNOSIS — K5520 Angiodysplasia of colon without hemorrhage: Secondary | ICD-10-CM

## 2021-07-04 DIAGNOSIS — Z7902 Long term (current) use of antithrombotics/antiplatelets: Secondary | ICD-10-CM | POA: Insufficient documentation

## 2021-07-04 DIAGNOSIS — Z79620 Long term (current) use of immunosuppressive biologic: Secondary | ICD-10-CM | POA: Insufficient documentation

## 2021-07-04 DIAGNOSIS — Z09 Encounter for follow-up examination after completed treatment for conditions other than malignant neoplasm: Secondary | ICD-10-CM

## 2021-07-04 HISTORY — PX: COLONOSCOPY: SHX5424

## 2021-07-04 HISTORY — PX: POLYPECTOMY: SHX5525

## 2021-07-04 SURGERY — COLONOSCOPY
Anesthesia: Moderate Sedation

## 2021-07-04 MED ORDER — MIDAZOLAM HCL 5 MG/5ML IJ SOLN
INTRAMUSCULAR | Status: DC | PRN
  Administered 2021-07-04: 1 mg via INTRAVENOUS
  Administered 2021-07-04: 2 mg via INTRAVENOUS

## 2021-07-04 MED ORDER — MEPERIDINE HCL 50 MG/ML IJ SOLN
INTRAMUSCULAR | Status: AC
  Filled 2021-07-04: qty 1

## 2021-07-04 MED ORDER — MEPERIDINE HCL 50 MG/ML IJ SOLN
INTRAMUSCULAR | Status: DC | PRN
  Administered 2021-07-04: 20 mg via INTRAVENOUS
  Administered 2021-07-04: 10 mg via INTRAVENOUS
  Administered 2021-07-04: 20 mg via INTRAVENOUS

## 2021-07-04 MED ORDER — MIDAZOLAM HCL 5 MG/5ML IJ SOLN
INTRAMUSCULAR | Status: AC
  Filled 2021-07-04: qty 10

## 2021-07-04 MED ORDER — SODIUM CHLORIDE 0.9 % IV SOLN: INTRAVENOUS | Status: DC

## 2021-07-04 NOTE — Discharge Instructions (Addendum)
Resume Eliquis tomorrow evening Resume other medications and diet as before. No driving for 24 hours. Physician will call with biopsy results.

## 2021-07-04 NOTE — H&P (Addendum)
Monique Day is an 85 y.o. female.   Chief Complaint: Patient is here for colonoscopy. HPI: Patient is 85 year old Caucasian female with over 50-year history of ulcerative colitis who is here for surveillance colonoscopy.  Her last full colonoscopy was in November 2017.  She did have a flexible sigmoidoscopy over 2 years ago when she had symptoms of relapse.  She is on Humira/adalimumab and doing fine.  She she has formed stools daily.  She denies abdominal pain or rectal bleeding.  Her appetite is good and her weight is stable. She has been off Eliquis for 2 days. Personal history is significant for breast carcinoma and she remains in remission.  Past Medical History:  Diagnosis Date   Adult idiopathic generalized osteoporosis    Arthritis    "minor" (08/18/2018)   Breast cancer, left breast ([facility]) 1998   lumpectomy   Breast cancer, right breast ([place]) 2009   lumpectomy; mastectomy   DVT (deep venous thrombosis) ([facility]) "early 2000's"   RLE       Hemorrhoids, external    History of blood transfusion    "related to ulcerative colitis"               Seizures ([place])    one very slight seizure with the stroke; somewhere between 2005-2009   [facility]roke [facility]) 2005-2009   "very light;" ; denies residual on 08/19/2018   UC (ulcerative colitis) ([place]) dx'd 3244    Past Surgical History:  Procedure Laterality Date   APPENDECTOMY     BIOPSY  04/14/2019   Procedure: BIOPSY;  Surgeon: [name], MD;  Location[facility];  Service: Endoscopy;;   bone density  12/11/10   BREAST BIOPSY Left 1998; 2019 X 2   BREAST BIOPSY Right 2009   BREAST LUMPECTOMY Left 1998   CATARACT EXTRACTION W/ INTRAOCULAR LENS  IMPLANT, BILATERAL Bilateral    COLONOSCOPY  02/21/2011   COLONOSCOPY  10/06/2008   COLONOSCOPY  12/27/01   COLONOSCOPY  05/09/05   COLONOSCOPY N/A 07/02/2016   Procedure: COLONOSCOPY;  Surgeon: [name], MD;  Location[facility];  Service: Endoscopy;  Laterality: N/A;  [place]   "I was having rectal bleeding"   FLEXIBLE SIGMOIDOSCOPY N/A 04/14/2019   Procedure: FLEXIBLE SIGMOIDOSCOPY;  Surgeon: [name], MD;  [facility];  Service: Endoscopy;  Laterality: N/A;  1:00   FRACTURE SURGERY     MASTECTOMY Right 2009   MASTECTOMY COMPLETE / SIMPLE Left 08/19/2018   PORTACATH PLACEMENT Right 10/15/2018   Procedure: INSERTION PORT-A-CATH RIGHT SUBCLAVIN;  Surgeon: [name], MD;  Location: [facility];  Service: General;  Laterality: Right;  pt knows to arrive at 7:30   [place] Left 08/19/2018   Procedure: LEFT SIMPLE MASTECTOMY;  Surgeon: [name], MD;  Location: [facility];  Service: General;  Laterality: Left;   TONSILLECTOMY     VENA CAVA FILTER PLACEMENT Right    WRIST FRACTURE SURGERY Right     Family History  Problem Relation Age of Onset   Prostate cancer Father    Colon cancer Neg Hx    Social History:  reports that she quit smoking about 20 years ago. Her smoking use included cigarettes. She has a 80.00 pack-year smoking history. She has never used smokeless tobacco. She reports current alcohol use. She reports that she does not use drugs.  Allergies:  Allergies  Allergen Reactions   Mercaptopurine Other (  See Comments)    Dropped WBC   Sulfa Antibiotics Other (See Comments)    Extreme Weakness    Medications Prior to Admission  Medication Sig Dispense Refill   Calcium Carb-Cholecalciferol (CALCIUM 600 + D PO) Take 1 tablet by mouth daily.     calcium carbonate (TUMS - DOSED IN MG ELEMENTAL CALCIUM) 500 MG chewable tablet Chew 2 tablets by mouth daily as needed for indigestion or heartburn.     clobetasol (TEMOVATE) 0.05 % external solution Apply 1 application topically daily as needed (psoriasis).     ELIQUIS 2.5 MG TABS tablet Take 1 tablet (2.5 mg total) by mouth 2 (two) times daily. 60 tablet    exemestane (AROMASIN) 25 MG tablet TAKE 1 TABLET DAILY AFTER  BREAKFAST. 30 tablet 0   FARXIGA 10 MG TABS tablet Take 10 mg by mouth daily.     HUMIRA PEN 40 MG/0.8ML PNKT INJECT 40 MG EVERY 14 DAYS AS DIRECTED. 2 each 11   Magnesium 250 MG TABS Take 250 mg by mouth daily.     Na Sulfate-K Sulfate-Mg Sulf (SUPREP BOWEL PREP KIT) 17.5-3.13-1.6 GM/177ML SOLN Take 1 kit by mouth as directed. 354 mL 0   OVER THE COUNTER MEDICATION Take 2-4 tablets by mouth 3 (three) times a week. Healthy Feet and Nerves otc supplement     polyethylene glycol (MIRALAX / GLYCOLAX) 17 g packet Take 17 g by mouth daily as needed for mild constipation.     PRESCRIPTION MEDICATION Apply 1 application topically daily as needed (irritation). Fluticasone 0.05 % and Ketoconazole 2% 1:1     denosumab (PROLIA) 60 MG/ML SOLN injection Inject 60 mg into the skin every 6 (six) months. Administer in upper arm, thigh, or abdomen     Naphazoline-Pheniramine (OPCON-A) 0.027-0.315 % SOLN Place 1 drop into both eyes daily as needed (allergies).      No results found for this or any previous visit (from the past 48 hour(s)). No results found.  Review of Systems  Blood pressure (!) 146/69, temperature 97.7 F (36.5 C), temperature source Oral, resp. rate 20, height 5' 2.5" (1.588 m), weight 77.1 kg, SpO2 100 %. Physical Exam HENT:     Mouth/Throat:     Mouth: Mucous membranes are moist.     Pharynx: Oropharynx is clear.  Eyes:     General: No scleral icterus.    Conjunctiva/sclera: Conjunctivae normal.  Cardiovascular:     Rate and Rhythm: Normal rate and regular rhythm.     Heart sounds: Normal heart sounds. No murmur heard. Pulmonary:     Effort: Pulmonary effort is normal.     Breath sounds: Normal breath sounds.  Abdominal:     General: There is no distension.     Palpations: Abdomen is soft. There is no mass.     Tenderness: There is no abdominal tenderness.  Musculoskeletal:        General: No swelling.     Cervical back: Neck supple.  Lymphadenopathy:     Cervical: No  cervical adenopathy.  Skin:    General: Skin is warm and dry.  Neurological:     Mental Status: She is alert.     Assessment/Plan  Chronic ulcerative colitis. Surveillance colonoscopy.  Hildred Laser, MD 07/04/2021, 7:26 AM

## 2021-07-04 NOTE — Op Note (Signed)
[facility] Patient Name: Monique Day Procedure Date: 07/04/2021 7:21 AM MRN: [id number] Date of Birth: Oct 08, 1935 Attending MD: Hildred Laser , MD CSN: [id number] Age: 85 Admit Type: Outpatient Procedure:                Colonoscopy Indications:              High risk colon cancer surveillance: Ulcerative                            pancolitis of 8 (or more) years duration Providers:                Hildred Laser, MD, Lambert Mody, Nelma Rothman,                            Technician Referring MD:             Hessie Diener. Georgina Quint, NP Medicines:                Meperidine 50 mg IV, Midazolam 3 mg IV Complications:            No immediate complications. Estimated Blood Loss:     Estimated blood loss was minimal. Procedure:                Pre-Anesthesia Assessment:                           - Prior to the procedure, a History and Physical                            was performed, and patient medications and                            allergies were reviewed. The patient's tolerance of                            previous anesthesia was also reviewed. The risks                            and benefits of the procedure and the sedation                            options and risks were discussed with the patient.                            All questions were answered, and informed consent                            was obtained. Prior Anticoagulants: The patient                            last took Eliquis (apixaban) 3 days prior to the                            procedure. ASA Grade Assessment: II - A patient  with mild systemic disease. After reviewing the                            risks and benefits, the patient was deemed in                            satisfactory condition to undergo the procedure.                           After obtaining informed consent, the colonoscope                            was passed under direct vision. Throughout the                             procedure, the patient's blood pressure, pulse, and                            oxygen saturations were monitored continuously. The                            [id number] ([id number]) was introduced through the                            anus and advanced to the the cecum, identified by                            appendiceal orifice and ileocecal valve. The                            colonoscopy was performed without difficulty. The                            patient tolerated the procedure well. The quality                            of the bowel preparation was excellent. The                            ileocecal valve, appendiceal orifice, and rectum                            were photographed. Scope In: 7:41:22 AM Scope Out: 7:58:50 AM Scope Withdrawal Time: 0 hours 9 minutes 23 seconds  Total Procedure Duration: 0 hours 17 minutes 28 seconds  Findings:      The perianal and digital rectal examinations were normal.      A single small angiodysplastic lesion without bleeding was found in the       cecum.      A healed ulcer was found in the entire colon.      A 6 mm polyp was found in the distal rectum. The polyp was removed with       a cold snare. Resection and retrieval were complete.      The retroflexed view of the distal rectum and  anal verge was normal and       showed no anal or rectal abnormalities. Impression:               - A single non-bleeding colonic angiodysplastic                            lesion.                           - Scar in the entire examined colon.                           - One 6 mm polyp in the distal rectum, removed with                            a cold snare. Resected and retrieved. Moderate Sedation:      Moderate (conscious) sedation was administered by the endoscopy nurse       and supervised by the endoscopist. The following parameters were       monitored: oxygen saturation, heart rate, blood pressure, CO2       capnography and  response to care. Total physician intraservice time was       22 minutes. Recommendation:           - Patient has a contact number available for                            emergencies. The signs and symptoms of potential                            delayed complications were discussed with the                            patient. Return to normal activities tomorrow.                            Written discharge instructions were provided to the                            patient.                           - Resume previous diet today.                           - Continue present medications.                           - Resume Eliquis (apixaban) at prior dose tomorrow.                           - Await pathology results.                           - No recommendation at this time regarding repeat  colonoscopy due to age. Procedure Code(s):        --- Professional ---                           [phone], Colonoscopy, flexible; with removal of                            tumor(s), polyp(s), or other lesion(s) by snare                            technique                           G0500, Moderate sedation services provided by the                            same physician or other qualified health care                            professional performing a gastrointestinal                            endoscopic service that sedation supports,                            requiring the presence of an independent trained                            observer to assist in the monitoring of the                            patient's level of consciousness and physiological                            status; initial 15 minutes of intra-service time;                            patient age 73 years or older (additional time may                            be reported with [phone], as appropriate) Diagnosis Code(s):        --- Professional ---                           K51.00, Ulcerative  (chronic) pancolitis without                            complications                           K55.20, Angiodysplasia of colon without hemorrhage                           K63.89, Other specified diseases of intestine  K62.1, Rectal polyp CPT copyright 2019 American [employer]. All rights reserved. The codes documented in this report are preliminary and upon coder review may  be revised to meet current compliance requirements. Hildred Laser, MD Hildred Laser, MD 07/04/2021 8:08:10 AM This report has been signed electronically. Number of Addenda: 0

## 2021-07-05 LAB — SURGICAL PATHOLOGY

## 2021-07-08 ENCOUNTER — Encounter (HOSPITAL_COMMUNITY): Payer: Self-pay | Admitting: Internal Medicine

## 2021-07-24 ENCOUNTER — Encounter (HOSPITAL_COMMUNITY): Payer: Self-pay

## 2021-07-24 ENCOUNTER — Other Ambulatory Visit: Payer: Self-pay

## 2021-07-24 ENCOUNTER — Other Ambulatory Visit (HOSPITAL_COMMUNITY): Payer: Medicare PPO

## 2021-07-24 ENCOUNTER — Inpatient Hospital Stay (HOSPITAL_COMMUNITY): Payer: Medicare PPO | Attending: Hematology

## 2021-07-24 DIAGNOSIS — K5190 Ulcerative colitis, unspecified, without complications: Secondary | ICD-10-CM | POA: Diagnosis not present

## 2021-07-24 DIAGNOSIS — Z79811 Long term (current) use of aromatase inhibitors: Secondary | ICD-10-CM | POA: Diagnosis not present

## 2021-07-24 DIAGNOSIS — M8580 Other specified disorders of bone density and structure, unspecified site: Secondary | ICD-10-CM | POA: Diagnosis not present

## 2021-07-24 DIAGNOSIS — Z9012 Acquired absence of left breast and nipple: Secondary | ICD-10-CM | POA: Insufficient documentation

## 2021-07-24 DIAGNOSIS — C50212 Malignant neoplasm of upper-inner quadrant of left female breast: Secondary | ICD-10-CM | POA: Insufficient documentation

## 2021-07-24 DIAGNOSIS — Z882 Allergy status to sulfonamides status: Secondary | ICD-10-CM | POA: Insufficient documentation

## 2021-07-24 DIAGNOSIS — R200 Anesthesia of skin: Secondary | ICD-10-CM | POA: Insufficient documentation

## 2021-07-24 DIAGNOSIS — N898 Other specified noninflammatory disorders of vagina: Secondary | ICD-10-CM | POA: Insufficient documentation

## 2021-07-24 DIAGNOSIS — Z86718 Personal history of other venous thrombosis and embolism: Secondary | ICD-10-CM | POA: Insufficient documentation

## 2021-07-24 DIAGNOSIS — Z7901 Long term (current) use of anticoagulants: Secondary | ICD-10-CM | POA: Diagnosis not present

## 2021-07-24 DIAGNOSIS — Z170 Estrogen receptor positive status [ER+]: Secondary | ICD-10-CM | POA: Diagnosis not present

## 2021-07-24 LAB — CBC WITH DIFFERENTIAL/PLATELET
Abs Immature Granulocytes: 0.02 10*3/uL (ref 0.00–0.07)
Basophils Absolute: 0 10*3/uL (ref 0.0–0.1)
Basophils Relative: 1 %
Eosinophils Absolute: 0.1 10*3/uL (ref 0.0–0.5)
Eosinophils Relative: 1 %
HCT: 42.8 % (ref 36.0–46.0)
Hemoglobin: 14.4 g/dL (ref 12.0–15.0)
Immature Granulocytes: 0 %
Lymphocytes Relative: 30 %
Lymphs Abs: 2.3 10*3/uL (ref 0.7–4.0)
MCH: 32.9 pg (ref 26.0–34.0)
MCHC: 33.6 g/dL (ref 30.0–36.0)
MCV: 97.7 fL (ref 80.0–100.0)
Monocytes Absolute: 0.6 10*3/uL (ref 0.1–1.0)
Monocytes Relative: 8 %
Neutro Abs: 4.7 10*3/uL (ref 1.7–7.7)
Neutrophils Relative %: 60 %
Platelets: 215 10*3/uL (ref 150–400)
RBC: 4.38 MIL/uL (ref 3.87–5.11)
RDW: 12.9 % (ref 11.5–15.5)
WBC: 7.8 10*3/uL (ref 4.0–10.5)
nRBC: 0 % (ref 0.0–0.2)

## 2021-07-24 LAB — COMPREHENSIVE METABOLIC PANEL
ALT: 18 U/L (ref 0–44)
AST: 22 U/L (ref 15–41)
Albumin: 4 g/dL (ref 3.5–5.0)
Alkaline Phosphatase: 27 U/L — ABNORMAL LOW (ref 38–126)
Anion gap: 7 (ref 5–15)
BUN: 26 mg/dL — ABNORMAL HIGH (ref 8–23)
CO2: 26 mmol/L (ref 22–32)
Calcium: 9.1 mg/dL (ref 8.9–10.3)
Chloride: 106 mmol/L (ref 98–111)
Creatinine, Ser: 1.18 mg/dL — ABNORMAL HIGH (ref 0.44–1.00)
GFR, Estimated: 46 mL/min — ABNORMAL LOW (ref >60)
Glucose, Bld: 110 mg/dL — ABNORMAL HIGH (ref 70–99)
Potassium: 3.8 mmol/L (ref 3.5–5.1)
Sodium: 139 mmol/L (ref 135–145)
Total Bilirubin: 0.6 mg/dL (ref 0.3–1.2)
Total Protein: 7.3 g/dL (ref 6.5–8.1)

## 2021-07-24 LAB — VITAMIN D 25 HYDROXY (VIT D DEFICIENCY, FRACTURES): Vit D, 25-Hydroxy: 41.95 ng/mL (ref 30–100)

## 2021-07-24 LAB — LACTATE DEHYDROGENASE: LDH: 137 U/L (ref 98–192)

## 2021-07-24 MED ORDER — HEPARIN SOD (PORK) LOCK FLUSH 100 UNIT/ML IV SOLN
500.0000 [IU] | Freq: Once | INTRAVENOUS | Status: AC
  Administered 2021-07-24: 500 [IU] via INTRAVENOUS

## 2021-07-24 MED ORDER — SODIUM CHLORIDE 0.9% FLUSH
10.0000 mL | INTRAVENOUS | Status: AC | PRN
  Administered 2021-07-24: 10 mL via INTRAVENOUS

## 2021-07-24 NOTE — Progress Notes (Signed)
Monique Day presented for Portacath access and flush.  Portacath located right chest wall accessed with  H 20 needle.  Good blood return present. Portacath flushed with 54m NS and 500U/559mHeparin and needle removed intact.  Procedure tolerated well and without incident.   Stable during and after procedure.  Vital signs stable.

## 2021-07-30 NOTE — Progress Notes (Signed)
[facility] [address], [place] [address]   Patient Care Team: [name], NP as PCP - General (Internal Medicine)  SUMMARY OF ONCOLOGIC HISTORY: Oncology History  Breast cancer of upper-inner quadrant of left female breast ([place])  07/21/2018 Initial Diagnosis   Breast cancer of upper-inner quadrant of left female breast ([place])   10/22/2018 -  Chemotherapy   The patient had trastuzumab (HERCEPTIN) 300 mg in sodium chloride 0.9 % 250 mL chemo infusion, 315 mg, Intravenous,  Once, 7 of 7 cycles Dose modification: 0.06 mg/kg (1 % of original dose 6 mg/kg, Cycle 6, Reason: Other (see comments), Comment: need to resign), 6 mg/kg (original dose 6 mg/kg, Cycle 6, Reason: Other (see comments), Comment: need to reenter to sign ), 2 mg/kg (original dose 6 mg/kg, Cycle 7, Reason: Other (see comments)), 6 mg/kg (original dose 6 mg/kg, Cycle 7, Reason: Other (see comments)) Administration: 300 mg (10/22/2018), 150 mg (10/29/2018), 150 mg (11/19/2018), 450 mg (02/03/2019), 450 mg (02/24/2019), 150 mg (11/05/2018), 150 mg (11/12/2018), 150 mg (12/01/2018), 150 mg (12/10/2018), 150 mg (12/17/2018), 150 mg (12/24/2018), 150 mg (12/31/2018), 150 mg (01/07/2019), 150 mg (01/14/2019), 450 mg (03/18/2019), 450 mg (04/08/2019) PACLitaxel (TAXOL) 72 mg in sodium chloride 0.9 % 150 mL chemo infusion (</= 73m/m2), 40 mg/m2 = 72 mg (50 % of original dose 80 mg/m2), Intravenous,  Once, 3 of 3 cycles Dose modification: 40 mg/m2 (50 % of original dose 80 mg/m2, Cycle 1, Reason: Provider Judgment) Administration: 72 mg (10/22/2018), 72 mg (10/29/2018), 72 mg (11/19/2018), 72 mg (11/05/2018), 72 mg (11/12/2018), 72 mg (12/01/2018), 72 mg (12/10/2018), 72 mg (12/17/2018), 72 mg (12/24/2018), 72 mg (12/31/2018), 72 mg (01/07/2019), 72 mg (01/14/2019) trastuzumab-anns (KANJINTI) 450 mg in sodium chloride 0.9 % 250 mL chemo infusion, 462 mg (100 % of original dose 6 mg/kg), Intravenous,  Once, 9 of 9 cycles Dose modification: 6 mg/kg  (original dose 6 mg/kg, Cycle 8, Reason: Provider Judgment, Comment: change to biosimilar) Administration: 450 mg (04/29/2019), 450 mg (05/20/2019), 450 mg (07/22/2019), 450 mg (08/15/2019), 450 mg (10/21/2019), 450 mg (06/10/2019), 450 mg (07/01/2019), 450 mg (09/09/2019), 450 mg (09/30/2019)   for chemotherapy treatment.       CHIEF COMPLIANT: Follow-up for breast cancer   INTERVAL HISTORY: [name] a 85y.o. female here today for follow up of her breast cancer. Her last visit was on 01/29/2021.   Today she reports feeling good. She is taking Aromasin and tolerating it well.   REVIEW OF SYSTEMS:   Review of Systems  Constitutional:  Negative for appetite change and fatigue (80%).  Neurological:  Positive for numbness (feet).  All other systems reviewed and are negative.  I have reviewed the past medical history, past surgical history, social history and family history with the patient and they are unchanged from previous note.   ALLERGIES:   is allergic to mercaptopurine and sulfa antibiotics.   MEDICATIONS:  Current Outpatient Medications  Medication Sig Dispense Refill   Calcium Carb-Cholecalciferol (CALCIUM 600 + D PO) Take 1 tablet by mouth daily.     denosumab (PROLIA) 60 MG/ML SOLN injection Inject 60 mg into the skin every 6 (six) months. Administer in upper arm, thigh, or abdomen     ELIQUIS 2.5 MG TABS tablet Take 1 tablet (2.5 mg total) by mouth 2 (two) times daily. 60 tablet    exemestane (AROMASIN) 25 MG tablet TAKE 1 TABLET DAILY AFTER BREAKFAST. 30 tablet 0   FARXIGA 10  MG TABS tablet Take 10 mg by mouth daily.     HUMIRA PEN 40 MG/0.8ML PNKT INJECT 40 MG EVERY 14 DAYS AS DIRECTED. 2 each 11   Magnesium 250 MG TABS Take 250 mg by mouth daily.     Na Sulfate-K Sulfate-Mg Sulf (SUPREP BOWEL PREP KIT) 17.5-3.13-1.6 GM/177ML SOLN Take 1 kit by mouth as directed. 354 mL 0   OVER THE COUNTER MEDICATION Take 2-4 tablets by mouth 3 (three) times a week.  Healthy Feet and Nerves otc supplement     calcium carbonate (TUMS - DOSED IN MG ELEMENTAL CALCIUM) 500 MG chewable tablet Chew 2 tablets by mouth daily as needed for indigestion or heartburn. (Patient not taking: Reported on 07/31/2021)     clobetasol (TEMOVATE) 0.05 % external solution Apply 1 application topically daily as needed (psoriasis). (Patient not taking: Reported on 07/31/2021)     Naphazoline-Pheniramine (OPCON-A) 0.027-0.315 % SOLN Place 1 drop into both eyes daily as needed (allergies). (Patient not taking: Reported on 07/31/2021)     PRESCRIPTION MEDICATION Apply 1 application topically daily as needed (irritation). Fluticasone 0.05 % and Ketoconazole 2% 1:1 (Patient not taking: Reported on 07/31/2021)     No current facility-administered medications for this visit.   Facility-Administered Medications Ordered in Other Visits  Medication Dose Route Frequency Provider Last Rate Last Admin   sodium chloride flush (NS) 0.9 % injection 10 mL  10 mL Intravenous PRN Derek Jack, MD   10 mL at 07/24/21 1429     PHYSICAL EXAMINATION: Performance status (ECOG): 1 - Symptomatic but completely ambulatory  Vitals:   07/31/21 1425  BP: (!) 144/80  Pulse: 87  Resp: 17  Temp: 97.8 F (36.6 C)  SpO2: 98%   Wt Readings from Last 3 Encounters:  07/31/21 172 lb 3.2 oz (78.1 kg)  07/24/21 176 lb (79.8 kg)  07/04/21 170 lb (77.1 kg)   Physical Exam Vitals reviewed.  Constitutional:      Appearance: Normal appearance. She is obese.  Cardiovascular:     Rate and Rhythm: Normal rate and regular rhythm.     Pulses: Normal pulses.     Heart sounds: Normal heart sounds.  Pulmonary:     Effort: Pulmonary effort is normal.     Breath sounds: Normal breath sounds.  Chest:  Breasts:    Right: Normal.     Left: Absent. No mass, skin change (mastectomy site witin normal limits) or tenderness.     Comments: R side port within normal limits Lymphadenopathy:     Cervical: No  cervical adenopathy.     Right cervical: No superficial cervical adenopathy.    Left cervical: No superficial cervical adenopathy.     Upper Body:     Right upper body: No supraclavicular, axillary or pectoral adenopathy.     Left upper body: No supraclavicular, axillary or pectoral adenopathy.  Neurological:     General: No focal deficit present.     Mental Status: She is alert and oriented to person, place, and time.  Psychiatric:        Mood and Affect: Mood normal.        Behavior: Behavior normal.    Breast Exam Chaperone: Thana Ates     LABORATORY DATA:  I have reviewed the data as listed CMP Latest Ref Rng & Units 07/24/2021 01/22/2021 08/23/2020  Glucose 70 - 99 mg/dL 110(H) 154(H) 139(H)  BUN 8 - 23 mg/dL 26(H) 20 20  Creatinine 0.44 - 1.00 mg/dL 1.18(H) 1.09(H) 1.23(H)  Sodium 135 - 145 mmol/L 139 138 139  Potassium 3.5 - 5.1 mmol/L 3.8 3.9 3.7  Chloride 98 - 111 mmol/L 106 106 106  CO2 22 - 32 mmol/L 26 26 23   Calcium 8.9 - 10.3 mg/dL 9.1 9.0 9.6  Total Protein 6.5 - 8.1 g/dL 7.3 7.3 7.7  Total Bilirubin 0.3 - 1.2 mg/dL 0.6 0.6 0.6  Alkaline Phos 38 - 126 U/L 27(L) 29(L) 30(L)  AST 15 - 41 U/L 22 21 22   ALT 0 - 44 U/L 18 19 20    No results found for: TIR443 Lab Results  Component Value Date   WBC 7.8 07/24/2021   HGB 14.4 07/24/2021   HCT 42.8 07/24/2021   MCV 97.7 07/24/2021   PLT 215 07/24/2021   NEUTROABS 4.7 07/24/2021    ASSESSMENT:  1.  Stage I HER-2 positive left breast cancer: -Left mastectomy on 08/19/2018, 1.1 cm IDC, grade 1, margins negative.  No lymph nodes were identified.  ER positive/PR positive/HER 2 3+. -12 doses of weekly paclitaxel and Herceptin from 10/22/2018 through 01/14/2019. -Herceptin every 3 weeks started on 02/03/2019 her last dose was on 10/21/2019. -Anastrozole started in December 2019, made her confused and forgetful. -Switched to Femara on 02/03/2019, which is causing vaginal irritation that has worsened over the past 6 months. -  She was switched to exemestane.   2.  Osteopenia: -Last DEXA scan on 02/08/2018 showed a T score of -1.2. -She is on Prolia every 6 months with Dr. Melina Copa.     3.  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 -She denies any bleeding episodes.   4.  Ulcerative colitis: -She is on Humira for the past few years.  She is also taking oral mesalamine. -She is following up with Dr. Laural Golden   PLAN:  1.  Stage I HER-2 positive left breast cancer: - She is tolerating exemestane very well. - Bilateral mastectomy sites are within normal limits. - Reviewed labs from 07/24/2021 which showed normal LFTs.  CBC was normal. - RTC 6 months for follow-up with repeat labs and physical exam. - We will consider discontinuing port after next visit.     2.  Osteopenia: - DEXA scan on 12/19/2020 with T score -1.3. - She is receiving Prolia injections every 6 months with her PMD.  Vitamin D level was 41. - Continue calcium and vitamin D supplements.     3.  Recurrent DVTs: - Continue Eliquis.  No bleeding issues.  Breast Cancer therapy associated bone loss: I have recommended calcium, Vitamin D and weight bearing exercises.  Orders placed this encounter:  No orders of the defined types were placed in this encounter.   The patient has a good understanding of the overall plan. She agrees with it. She will call with any problems that may develop before the next visit here.  Derek Jack, MD [place] [phone]   I, Thana Ates, am acting as a scribe for Dr. Derek Jack.  I, Derek Jack MD, have reviewed the above documentation for accuracy and completeness, and I agree with the above.

## 2021-07-31 ENCOUNTER — Inpatient Hospital Stay (HOSPITAL_COMMUNITY): Payer: Medicare PPO | Admitting: Hematology

## 2021-07-31 ENCOUNTER — Other Ambulatory Visit: Payer: Self-pay

## 2021-07-31 VITALS — BP 144/80 | HR 87 | Temp 97.8°F | Resp 17 | Ht 62.0 in | Wt 172.2 lb

## 2021-07-31 DIAGNOSIS — C50212 Malignant neoplasm of upper-inner quadrant of left female breast: Secondary | ICD-10-CM

## 2021-07-31 DIAGNOSIS — Z170 Estrogen receptor positive status [ER+]: Secondary | ICD-10-CM | POA: Diagnosis not present

## 2021-07-31 MED ORDER — EXEMESTANE 25 MG PO TABS: ORAL_TABLET | ORAL | 6 refills | Status: DC

## 2021-07-31 NOTE — Patient Instructions (Signed)
[place] at [facility] Discharge Instructions  You were seen and examined today by Dr. Delton Coombes. He reviewed your most recent labs and everything looks okay. Please keep follow up as scheduled in 6 months.   Thank you for choosing [place] at [facility] to provide your oncology and hematology care.  To afford each patient quality time with our provider, please arrive at least 15 minutes before your scheduled appointment time.   If you have a lab appointment with the [facility] please come in thru the Main Entrance and check in at the main information desk.  You need to re-schedule your appointment should you arrive 10 or more minutes late.  We strive to give you quality time with our providers, and arriving late affects you and other patients whose appointments are after yours.  Also, if you no show three or more times for appointments you may be dismissed from the clinic at the providers discretion.     Again, thank you for choosing [facility].  Our hope is that these requests will decrease the amount of time that you wait before being seen by our physicians.       _____________________________________________________________  Should you have questions after your visit to [facility], please contact our office at [phone] and follow the prompts.  Our office hours are 8:00 a.m. and 4:30 p.m. Monday - Friday.  Please note that voicemails left after 4:00 p.m. may not be returned until the following business day.  We are closed weekends and major holidays.  You do have access to a nurse 24-7, just call the main number to the clinic [phone] and do not press any options, hold on the line and a nurse will answer the phone.    For prescription refill requests, have your pharmacy contact our office and allow 72 hours.    Due to Covid, you will need to wear a mask upon entering the hospital. If you do not have a  mask, a mask will be given to you at the Main Entrance upon arrival. For doctor visits, patients may have 1 support person age 25 or older with them. For treatment visits, patients can not have anyone with them due to social distancing guidelines and our immunocompromised population.

## 2021-09-24 ENCOUNTER — Other Ambulatory Visit (HOSPITAL_COMMUNITY): Payer: Self-pay | Admitting: Hematology

## 2021-10-08 ENCOUNTER — Ambulatory Visit (INDEPENDENT_AMBULATORY_CARE_PROVIDER_SITE_OTHER): Payer: Medicare PPO | Admitting: Internal Medicine

## 2021-10-08 ENCOUNTER — Other Ambulatory Visit: Payer: Self-pay

## 2021-10-08 ENCOUNTER — Encounter (INDEPENDENT_AMBULATORY_CARE_PROVIDER_SITE_OTHER): Payer: Self-pay | Admitting: Internal Medicine

## 2021-10-08 VITALS — BP 126/58 | HR 82 | Temp 98.7°F | Ht 62.0 in | Wt 170.6 lb

## 2021-10-08 DIAGNOSIS — K5100 Ulcerative (chronic) pancolitis without complications: Secondary | ICD-10-CM

## 2021-10-08 NOTE — Progress Notes (Signed)
Presenting complaint;  Follow-up for ulcerative colitis.  Database and subjective:  Patient is 86 year old Caucasian female with over 50-year pan ulcerative colitis who is here for scheduled visit.  She is presently maintained on Humira/adalimumab.  Her last surveillance colonoscopy was in November 2022 revealing endoscopic healing.  She has single nonbleeding AV malformation.  Small rectal polyp was removed and was inflammatory type of polyp. She had blood work by PCP in December 2022.  Which is under lab data.  She states she is doing well.  She denies abdominal pain or cramping.  If she does not take polyethylene glycol she becomes constipated.  With help her bowels move daily.  Her appetite is good and her weight has been stable.  She says she is very active although she does not do any structured physical activity.  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 She is using topical steroid for skin rash secondary to psoriasis which she believes may have started when she was begun on biologic.   Her inflammatory bowel disease history significant for pancytopenia after which she took 6-MP for few days. He had major bleeding November 23 while she was on anticoagulant and required IVC filter.  She had concerns about this filter.  Consultation was obtained with IR couple of years ago when she decided not to proceed with IVC filter removal.   Current Medications: Outpatient Encounter Medications as of 10/08/2021  Medication Sig   Calcium Carb-Cholecalciferol (CALCIUM 600 + D PO) Take 1 tablet by mouth daily.   clobetasol (TEMOVATE) 0.05 % external solution Apply 1 application topically daily as needed (psoriasis).   denosumab (PROLIA) 60 MG/ML SOLN injection Inject 60 mg into the skin every 6 (six) months. Administer in upper arm, thigh, or abdomen   ELIQUIS 2.5 MG TABS tablet Take 1 tablet (2.5 mg total) by mouth 2 (two) times daily.   exemestane (AROMASIN) 25 MG tablet TAKE 1 TABLET DAILY  AFTER BREAKFAST.   FARXIGA 10 MG TABS tablet Take 10 mg by mouth daily.   HUMIRA PEN 40 MG/0.8ML PNKT INJECT 40 MG EVERY 14 DAYS AS DIRECTED.   Magnesium 250 MG TABS Take 250 mg by mouth daily.   Naphazoline-Pheniramine (OPCON-A) 0.027-0.315 % SOLN Place 1 drop into both eyes daily as needed (allergies).   OVER THE COUNTER MEDICATION Take 2-4 tablets by mouth 3 (three) times a week. Healthy Feet and Nerves otc supplement   polyethylene glycol (MIRALAX / GLYCOLAX) 17 g packet Take 17 g by mouth daily. 1 capful daily   PRESCRIPTION MEDICATION Apply 1 application topically daily as needed (irritation). Fluticasone 0.05 % and Ketoconazole 2% 1:1   [DISCONTINUED] calcium carbonate (TUMS - DOSED IN MG ELEMENTAL CALCIUM) 500 MG chewable tablet Chew 2 tablets by mouth daily as needed for indigestion or heartburn. (Patient not taking: Reported on 07/31/2021)   [DISCONTINUED] Na Sulfate-K Sulfate-Mg Sulf (SUPREP BOWEL PREP KIT) 17.5-3.13-1.6 GM/177ML SOLN Take 1 kit by mouth as directed.   Facility-Administered Encounter Medications as of 10/08/2021  Medication   sodium chloride flush (NS) 0.9 % injection 10 mL     Objective: Blood pressure (!) 126/58, pulse 82, temperature 98.7 F (37.1 C), temperature source Oral, height 5' 2" (1.575 m), weight 170 lb 9.6 oz (77.4 kg). Patient is alert and in no acute distress. Conjunctiva is pink. Sclera is nonicteric Oropharyngeal mucosa is normal. No neck masses or thyromegaly noted. Cardiac exam with regular rhythm normal S1 and S2. No murmur or gallop noted. Lungs are  clear to auscultation. Abdomen is full but soft and nontender with organomegaly or masses. No LE edema or clubbing noted.  Labs/studies Results:   CBC Latest Ref Rng & Units 07/24/2021 01/22/2021 08/23/2020  WBC 4.0 - 10.5 K/uL 7.8 7.7 7.8  Hemoglobin 12.0 - 15.0 g/dL 14.4 15.4(H) 15.3(H)  Hematocrit 36.0 - 46.0 % 42.8 45.9 45.3  Platelets 150 - 400 K/uL 215 183 192    CMP Latest Ref Rng &  Units 07/24/2021 01/22/2021 08/23/2020  Glucose 70 - 99 mg/dL 110(H) 154(H) 139(H)  BUN 8 - 23 mg/dL 26(H) 20 20  Creatinine 0.44 - 1.00 mg/dL 1.18(H) 1.09(H) 1.23(H)  Sodium 135 - 145 mmol/L 139 138 139  Potassium 3.5 - 5.1 mmol/L 3.8 3.9 3.7  Chloride 98 - 111 mmol/L 106 106 106  CO2 22 - 32 mmol/L _0 Calcium 8.9 - 10.3 mg/dL 9.1 9.0 9.6  Total Protein 6.5 - 8.1 g/dL 7.3 7.3 7.7  Total Bilirubin 0.3 - 1.2 mg/dL 0.6 0.6 0.6  Alkaline Phos 38 - 126 U/L 27(L) 29(L) 30(L)  AST 15 - 41 U/L _1 ALT 0 - 44 U/L _2 Hepatic Function Latest Ref Rng & Units 07/24/2021 01/22/2021 08/23/2020  Total Protein 6.5 - 8.1 g/dL 7.3 7.3 7.7  Albumin 3.5 - 5.0 g/dL 4.0 4.0 4.1  AST 15 - 41 U/L _3 ALT 0 - 44 U/L _4 Alk Phosphatase 38 - 126 U/L 27(L) 29(L) 30(L)  Total Bilirubin 0.3 - 1.2 mg/dL 0.6 0.6 0.6    Lab Results  Component Value Date   CRP <0.5 06/19/2014    Vitamin D2 level 41.95  Assessment:  #1.  Chronic ulcerative colitis.  She was diagnosed with pan ulcerative colitis over 50 years ago.  For the last few years she has been maintained on Humira/adalimumab.  Last surveillance colonoscopy was 3 months ago and endoscopic healing was well documented.  She had one inflammatory polyp removed.  Given her age we may not subject to future surveillance exams.  However if she develops diarrhea or bleeding she would need diagnostic work-up. She is up-to-date on lab studies.  Plan:  Continue Humira/adalimumab at current dose of 40 mg subcu every 14 days. Patient will call if she has diarrhea or rectal bleeding. Office visit with Dr. Jenetta Downer in 6 months.

## 2021-10-08 NOTE — Patient Instructions (Signed)
Please call office if you experience rectal bleeding or diarrhea.

## 2021-10-30 ENCOUNTER — Other Ambulatory Visit: Payer: Self-pay

## 2021-10-30 ENCOUNTER — Inpatient Hospital Stay (HOSPITAL_COMMUNITY): Payer: Medicare PPO | Attending: Hematology

## 2021-10-30 ENCOUNTER — Encounter (HOSPITAL_COMMUNITY): Payer: Self-pay

## 2021-10-30 DIAGNOSIS — C50212 Malignant neoplasm of upper-inner quadrant of left female breast: Secondary | ICD-10-CM | POA: Insufficient documentation

## 2021-10-30 DIAGNOSIS — Z452 Encounter for adjustment and management of vascular access device: Secondary | ICD-10-CM | POA: Diagnosis not present

## 2021-10-30 DIAGNOSIS — Z170 Estrogen receptor positive status [ER+]: Secondary | ICD-10-CM | POA: Insufficient documentation

## 2021-10-30 MED ORDER — HEPARIN SOD (PORK) LOCK FLUSH 100 UNIT/ML IV SOLN
500.0000 [IU] | Freq: Once | INTRAVENOUS | Status: AC
  Administered 2021-10-30: 500 [IU] via INTRAVENOUS

## 2021-10-30 MED ORDER — SODIUM CHLORIDE 0.9% FLUSH
10.0000 mL | Freq: Once | INTRAVENOUS | Status: AC
  Administered 2021-10-30: 10 mL

## 2021-10-30 NOTE — Patient Instructions (Signed)
[facility]  Discharge Instructions: ?Thank you for choosing [facility] to provide your oncology and hematology care.  ?If you have a lab appointment with the [facility], please come in thru the Main Entrance and check in at the main information desk. ? ?Wear comfortable clothing and clothing appropriate for easy access to any Portacath or PICC line.  ? ?We strive to give you quality time with your provider. You may need to reschedule your appointment if you arrive late (15 or more minutes).  Arriving late affects you and other patients whose appointments are after yours.  Also, if you miss three or more appointments without notifying the office, you may be dismissed from the clinic at the provider?s discretion.    ?  ?For prescription refill requests, have your pharmacy contact our office and allow 72 hours for refills to be completed.   ? ?Today you received port flush.     ?  ?To help prevent nausea and vomiting after your treatment, we encourage you to take your nausea medication as directed. ? ?BELOW ARE SYMPTOMS THAT SHOULD BE REPORTED IMMEDIATELY: ?*FEVER GREATER THAN 100.4 F (38 ?C) OR HIGHER ?*CHILLS OR SWEATING ?*NAUSEA AND VOMITING THAT IS NOT CONTROLLED WITH YOUR NAUSEA MEDICATION ?*UNUSUAL SHORTNESS OF BREATH ?*UNUSUAL BRUISING OR BLEEDING ?*URINARY PROBLEMS (pain or burning when urinating, or frequent urination) ?*BOWEL PROBLEMS (unusual diarrhea, constipation, pain near the anus) ?TENDERNESS IN MOUTH AND THROAT WITH OR WITHOUT PRESENCE OF ULCERS (sore throat, sores in mouth, or a toothache) ?UNUSUAL RASH, SWELLING OR PAIN  ?UNUSUAL VAGINAL DISCHARGE OR ITCHING  ? ?Items with * indicate a potential emergency and should be followed up as soon as possible or go to the Emergency Department if any problems should occur. ? ?Please show the CHEMOTHERAPY ALERT CARD or IMMUNOTHERAPY ALERT CARD at check-in to the Emergency Department and triage nurse. ? ?Should you have questions  after your visit or need to cancel or reschedule your appointment, please contact [facility] [phone]  and follow the prompts.  Office hours are 8:00 a.m. to 4:30 p.m. Monday - Friday. Please note that voicemails left after 4:00 p.m. may not be returned until the following business day.  We are closed weekends and major holidays. You have access to a nurse at all times for urgent questions. Please call the main number to the clinic [phone] and follow the prompts. ? ?For any non-urgent questions, you may also contact your provider using MyChart. We now offer e-Visits for anyone 47 and older to request care online for non-urgent symptoms. For details visit [email or web address]. ?  ?Also download the MyChart app! Go to the app store, search "MyChart", open the app, select [facility], and log in with your MyChart username and password. ? ?Due to Covid, a mask is required upon entering the hospital/clinic. If you do not have a mask, one will be given to you upon arrival. For doctor visits, patients may have 1 support person aged 53 or older with them. For treatment visits, patients cannot have anyone with them due to current Covid guidelines and our immunocompromised population.  ?

## 2021-10-30 NOTE — Progress Notes (Signed)
Monique Day presented for Portacath access and flush.  Portacath located right chest wall accessed with  H 20 needle.  No blood return.  Portacath flushed with 10 ml NS and 500U/38m Heparin and needle removed intact.  Procedure tolerated well and without incident.   Treatment given today per MD orders. Vital signs stable. No complaints at this time. Discharged from clinic ambulatory in stable condition. Alert and oriented x 3. [facility]   ?

## 2021-12-17 ENCOUNTER — Emergency Department (HOSPITAL_COMMUNITY): Payer: Medicare PPO

## 2021-12-17 ENCOUNTER — Other Ambulatory Visit: Payer: Self-pay

## 2021-12-17 ENCOUNTER — Encounter (HOSPITAL_COMMUNITY): Payer: Self-pay

## 2021-12-17 ENCOUNTER — Inpatient Hospital Stay (HOSPITAL_COMMUNITY)
Admission: EM | Admit: 2021-12-17 | Discharge: 2021-12-21 | DRG: 481 | Disposition: A | Discharge status: Skilled Nursing Facility | Payer: Medicare PPO | Attending: Internal Medicine | Admitting: Internal Medicine

## 2021-12-17 DIAGNOSIS — K279 Peptic ulcer, site unspecified, unspecified as acute or chronic, without hemorrhage or perforation: Secondary | ICD-10-CM | POA: Diagnosis not present

## 2021-12-17 DIAGNOSIS — Z7901 Long term (current) use of anticoagulants: Secondary | ICD-10-CM | POA: Diagnosis not present

## 2021-12-17 DIAGNOSIS — Z9013 Acquired absence of bilateral breasts and nipples: Secondary | ICD-10-CM

## 2021-12-17 DIAGNOSIS — E669 Obesity, unspecified: Secondary | ICD-10-CM | POA: Diagnosis present

## 2021-12-17 DIAGNOSIS — D62 Acute posthemorrhagic anemia: Secondary | ICD-10-CM | POA: Diagnosis not present

## 2021-12-17 DIAGNOSIS — W1830XA Fall on same level, unspecified, initial encounter: Secondary | ICD-10-CM | POA: Diagnosis present

## 2021-12-17 DIAGNOSIS — Z853 Personal history of malignant neoplasm of breast: Secondary | ICD-10-CM | POA: Diagnosis not present

## 2021-12-17 DIAGNOSIS — Z79899 Other long term (current) drug therapy: Secondary | ICD-10-CM

## 2021-12-17 DIAGNOSIS — K5120 Ulcerative (chronic) proctitis without complications: Secondary | ICD-10-CM | POA: Diagnosis present

## 2021-12-17 DIAGNOSIS — Z86718 Personal history of other venous thrombosis and embolism: Secondary | ICD-10-CM | POA: Diagnosis not present

## 2021-12-17 DIAGNOSIS — S72142A Displaced intertrochanteric fracture of left femur, initial encounter for closed fracture: Secondary | ICD-10-CM | POA: Diagnosis present

## 2021-12-17 DIAGNOSIS — D649 Anemia, unspecified: Secondary | ICD-10-CM | POA: Diagnosis not present

## 2021-12-17 DIAGNOSIS — Z6831 Body mass index (BMI) 31.0-31.9, adult: Secondary | ICD-10-CM | POA: Diagnosis not present

## 2021-12-17 DIAGNOSIS — M810 Age-related osteoporosis without current pathological fracture: Secondary | ICD-10-CM | POA: Diagnosis present

## 2021-12-17 DIAGNOSIS — S72002A Fracture of unspecified part of neck of left femur, initial encounter for closed fracture: Secondary | ICD-10-CM | POA: Diagnosis not present

## 2021-12-17 DIAGNOSIS — Z87891 Personal history of nicotine dependence: Secondary | ICD-10-CM | POA: Diagnosis not present

## 2021-12-17 DIAGNOSIS — Z8673 Personal history of transient ischemic attack (TIA), and cerebral infarction without residual deficits: Secondary | ICD-10-CM

## 2021-12-17 DIAGNOSIS — Z95828 Presence of other vascular implants and grafts: Secondary | ICD-10-CM | POA: Diagnosis not present

## 2021-12-17 DIAGNOSIS — Z20822 Contact with and (suspected) exposure to covid-19: Secondary | ICD-10-CM | POA: Diagnosis present

## 2021-12-17 DIAGNOSIS — K5900 Constipation, unspecified: Secondary | ICD-10-CM | POA: Diagnosis present

## 2021-12-17 DIAGNOSIS — N1831 Chronic kidney disease, stage 3a: Secondary | ICD-10-CM | POA: Diagnosis present

## 2021-12-17 DIAGNOSIS — I82409 Acute embolism and thrombosis of unspecified deep veins of unspecified lower extremity: Secondary | ICD-10-CM | POA: Diagnosis present

## 2021-12-17 MED ORDER — ONDANSETRON HCL 4 MG PO TABS: 4.0000 mg | ORAL_TABLET | Freq: Four times a day (QID) | ORAL | Status: DC | PRN

## 2021-12-17 MED ORDER — POLYETHYLENE GLYCOL 3350 17 G PO PACK: 17.0000 g | PACK | Freq: Every day | ORAL | Status: DC | PRN

## 2021-12-17 MED ORDER — ACETAMINOPHEN 325 MG PO TABS
650.0000 mg | ORAL_TABLET | Freq: Once | ORAL | Status: AC
  Administered 2021-12-17: 650 mg via ORAL
  Filled 2021-12-17: qty 2

## 2021-12-17 MED ORDER — ACETAMINOPHEN 325 MG PO TABS: 650.0000 mg | ORAL_TABLET | Freq: Four times a day (QID) | ORAL | Status: DC | PRN

## 2021-12-17 MED ORDER — ACETAMINOPHEN 325 MG PO TABS
650.0000 mg | ORAL_TABLET | Freq: Four times a day (QID) | ORAL | Status: DC | PRN
  Administered 2021-12-17 – 2021-12-18 (×4): 650 mg via ORAL
  Filled 2021-12-17 (×5): qty 2

## 2021-12-17 MED ORDER — OXYCODONE HCL 5 MG PO TABS: 5.0000 mg | ORAL_TABLET | Freq: Four times a day (QID) | ORAL | Status: DC | PRN

## 2021-12-17 MED ORDER — MORPHINE SULFATE (PF) 2 MG/ML IV SOLN: 2.0000 mg | INTRAVENOUS | Status: DC | PRN

## 2021-12-17 MED ORDER — ONDANSETRON HCL 4 MG/2ML IJ SOLN: 4.0000 mg | Freq: Four times a day (QID) | INTRAMUSCULAR | Status: DC | PRN

## 2021-12-17 MED ORDER — ACETAMINOPHEN 650 MG RE SUPP: 650.0000 mg | Freq: Four times a day (QID) | RECTAL | Status: DC | PRN

## 2021-12-17 NOTE — Assessment & Plan Note (Signed)
Hold Eliquis. ?

## 2021-12-17 NOTE — ED Triage Notes (Signed)
Patient arrived via EMS with complaints of right hip pain and knee pain following a fall. Patient states she "lost her footing" and that is why she fell. ?

## 2021-12-17 NOTE — Assessment & Plan Note (Addendum)
Stable.  She is on Humira injections every 2 weeks.  Follows with Dr. Laural Golden. ?

## 2021-12-17 NOTE — ED Notes (Signed)
Pedal pulses strong bilaterally. CMS intact, foot and leg are warm to touch.  ?Pt states pain is mostly in left knee, pain travels up her hip  ? ?

## 2021-12-17 NOTE — ED Notes (Signed)
Patient transported to CT 

## 2021-12-17 NOTE — ED Notes (Signed)
Report given to RN on 300 ?

## 2021-12-17 NOTE — H&P (Addendum)
?History and Physical  ? ? ?Monique Day SLH:[id number] DOB: [birth year] DOA: 12/17/2021 ? ?PCP: [name], NP  ? ?Patient coming from: Home ? ?I have personally briefly reviewed patient's old medical records in [place] ? ?Chief Complaint: Fall ? ?HPI: Monique Day is a 86 y.o. female with medical history significant for ulcerative colitis, DVT on chronic anticoagulation with Eliquis, stroke. ?Patient presented to the ED after a fall with subsequent complaints of right hip pain.  Patient reports she was coming out of a restaurant, she stepped off a curb and was by her car, getting her key out when she felt like her left leg gave out and she fell on her left hip.  She did not hit her head, she did not lose consciousness.  Fall was witnessed by many people.  She denies frequent falls.  She denies headache, no chest pain or difficulty breathing, no dizziness, no focal weakness of extremities, she has maintained good oral intake, she was basically in her normal state of health. ? ?ED Course: Stable vitals.  Pelvic x-ray shows acute proximal left femur fracture. EDP talked with Dr. Aline Brochure, will see in consult, plan for surgery 5/4 as patient is on Eliquis, to allow for washout. ? ?Review of Systems: As per HPI all other systems reviewed and negative. ? ?Past Medical History:  ?Diagnosis Date  ? Adult idiopathic generalized osteoporosis   ? Arthritis   ? "minor" (08/18/2018)  ? Breast cancer, left breast ([place]) 1998  ? lumpectomy  ? Breast cancer, right breast [facility]) 2009  ? lumpectomy; mastectomy  ? DVT (deep venous thrombosis) ([place]) "early 2000's"  ? RLE  ? DVT (deep venous thrombosis) ([facility])   ? Hemorrhoids, external   ? History of blood transfusion   ? "related to ulcerative colitis"  ? Lower abdominal pain   ? Occult blood in stools   ? Peripheral edema   ? Seizures ([place])   ? one very slight seizure with the stroke; somewhere between 2005-2009  ? Stroke [facility]) 2005-2009  ? "very light;" ; denies  residual on 08/19/2018  ? UC (ulcerative colitis) ([place]) dx'd 1957  ? ? ?Past Surgical History:  ?Procedure Laterality Date  ? APPENDECTOMY    ? BIOPSY  04/14/2019  ? Procedure: BIOPSY;  Surgeon: [name], MD;  Location: [facility] ENDO SU[facility];  Service: Endoscopy;;  ? bone density  12/11/10  ? BREAST BIOPSY Left 1998; 2019 X 2  ? BREAST BIOPSY Right 2009  ? BREAST LUMPECTOMY Left 1998  ? CATARACT EXTRACTION W/ INTRAOCULAR LENS  IMPLANT, BILATERAL Bilateral   ? COLONOSCOPY  02/21/2011  ? COLONOSCOPY  10/06/2008  ? COLONOSCOPY  12/27/01  ? COLONOSCOPY  05/09/05  ? COLONOSCOPY N/A 07/02/2016  ? Procedure: COLONOSCOPY;  Surgeon: [name], MD;  Location: [facility] ENDO SUITE;  Service: Endoscopy;  Laterality: N/A;  1055  ? COLONOSCOPY N/A 07/04/2021  ? Procedure: COLONOSCOPY;  Surgeon: [name], MD;  Location: [facility] ENDO SUITE;  Service: Endoscopy;  Laterality: N/A;  7:30  ? [place]  ? "I was having rectal bleeding"  ? FLEXIBLE SIGMOIDOSCOPY N/A 04/14/2019  ? Procedure: FLEXIBLE SIGMOIDOSCOPY;  Surgeon: [name], MD;  Location: [facility] ENDO SUITE;  Service: Endoscopy;  Laterality: N/A;  1:00  ? FRACTURE SURGERY    ? MASTECTOMY Right 2009  ? MASTECTOMY COMPLETE / SIMPLE Left 08/19/2018  ? POLYPECTOMY  07/04/2021  ? Procedure: POLYPECTOMY;  Surgeon: [name], MD;  Location: [facility] ENDO SUITE;  Service: Endoscopy;;  ? PORTACATH PLACEMENT Right 10/15/2018  ? Procedure: INSERTION PORT-A-CATH RIGHT SUBCLAVIN;  Surgeon: [name], MD;  Location: [facility] ORS;  Service: General;  Laterality: Right;  pt knows to arrive at 7:30  ? SIMPLE MASTECTOMY WITH AXILLARY SENTINEL NODE BIOPSY Left 08/19/2018  ? Procedure: LEFT SIMPLE MASTECTOMY;  Surgeon: [name], MD;  Location: [facility];  Service: General;  Laterality: Left;  ? TONSILLECTOMY    ? VENA CAVA FILTER PLACEMENT Right   ? WRIST FRACTURE SURGERY Right   ? ? ? reports that she quit smoking about 20 years ago. Her smoking use included cigarettes. She has  a 80.00 pack-year smoking history. She has never used smokeless tobacco. She reports current alcohol use. She reports that she does not use drugs. ? ?Allergies  ?Allergen Reactions  ? Mercaptopurine Other (See Comments)  ?  Dropped WBC  ? Sulfa Antibiotics Other (See Comments)  ?  Extreme Weakness  ? ? ?Family History  ?Problem Relation Age of Onset  ? Prostate cancer Father   ? Colon cancer Neg Hx   ? ? ?Prior to Admission medications   ?Medication Sig Start Date End Date Taking? Authorizing Provider  ?Adalimumab (HUMIRA) 40 MG/0.8ML PSKT 0.8 ml Subcutaneous every 14 days    [provider]  ?apixaban (ELIQUIS) 2.5 MG TABS tablet TAKE (1) TABLET TWICE DAILY. Orally twice a day for 30 days    [provider]  ?Calcium Carb-Cholecalciferol (CALCIUM 600 + D PO) Take 1 tablet by mouth daily.    [provider]  ?Calcium Carbonate-Vit D-Min (CALCIUM 600+D3 PLUS MINERALS) 600-800 MG-UNIT CHEW 1 tablet with a meal Orally Once a day    [provider]  ?clobetasol (TEMOVATE) 0.05 % external solution Apply 1 application topically daily as needed (psoriasis). 05/30/21   [provider]  ?Stasia Cavalier (EUCRISA) 2 % OINT 1 application to affected area Externally Twice a day for 10 days 11/24/17   [provider]  ?dapagliflozin propanediol (FARXIGA) 10 MG TABS tablet 1 tablet Orally Once a day for 90 days    [provider]  ?denosumab (PROLIA) 60 MG/ML SOLN injection Inject 60 mg into the skin every 6 (six) months. Administer in upper arm, thigh, or abdomen    [provider]  ?denosumab (PROLIA) 60 MG/ML SOSY injection 1 Subcutaneous every 6 mo for 1 days    [provider]  ?ELIQUIS 2.5 MG TABS tablet Take 1 tablet (2.5 mg total) by mouth 2 (two) times daily. 07/05/21   [name], MD  ?exemestane (AROMASIN) 25 MG tablet TAKE 1 TABLET DAILY AFTER BREAKFAST. 09/24/21   Derek Jack, MD  ?exemestane (AROMASIN) 25 MG tablet 1 tablet with  a meal Orally Once a day for 30 day(s)    [provider]  ?FARXIGA 10 MG TABS tablet Take 10 mg by mouth daily. 06/26/20   [provider]  ?HUMIRA PEN 32 MG/0.8ML PNKT INJECT 40 MG EVERY 14 DAYS AS DIRECTED. 04/08/21   Gabriel Rung, NP  ?Magnesium 250 MG TABS Take 250 mg by mouth daily.    [provider]  ?Magnesium 500 MG CAPS 1 tablet with a meal Orally Once a day    [provider]  ?Multiple Vitamins-Minerals (MULTI COMPLETE) CAPS as directed Orally    [provider]  ?Naphazoline-Pheniramine (OPCON-A) 0.027-0.315 % SOLN Place 1 drop into both eyes daily as needed (allergies).    [provider]  ?OVER  THE COUNTER MEDICATION Take 2-4 tablets by mouth 3 (three) times a week. Healthy Feet and Nerves otc supplement    [provider]  ?polyethylene glycol (MIRALAX / GLYCOLAX) 17 g packet Take 17 g by mouth daily. 1 capful daily    [provider]  ?polyethylene glycol (MIRALAX / GLYCOLAX) 17 g packet 17 gram Orally Once a day for 30 day(s)    [provider]  ?PRESCRIPTION MEDICATION Apply 1 application topically daily as needed (irritation). Fluticasone 0.05 % and Ketoconazole 2% 1:1    [provider]  ?PROLIA 60 MG/ML SOSY injection Inject into the skin. 09/17/21   [provider]  ? ? ?Physical Exam: ?Vitals:  ? 12/17/21 1349 12/17/21 1352 12/17/21 1900  ?BP:  (!) 168/63 (!) 148/76  ?Pulse:  86 100  ?Resp:  18 18  ?Temp:  98.6 ?F (37 ?C)   ?TempSrc:  Oral   ?SpO2:  100% 94%  ?Weight: 77 kg    ?Height: 5' 2"  (1.575 m)    ? ? ?Constitutional: NAD, calm, comfortable ?Vitals:  ? 12/17/21 1349 12/17/21 1352 12/17/21 1900  ?BP:  (!) 168/63 (!) 148/76  ?Pulse:  86 100  ?Resp:  18 18  ?Temp:  98.6 ?F (37 ?C)   ?TempSrc:  Oral   ?SpO2:  100% 94%  ?Weight: 77 kg    ?Height: 5' 2"  (1.575 m)    ? ?Eyes: PERRL, lids and conjunctivae normal ?ENMT: Mucous membranes are moist.  ?Neck: normal, supple, no masses, no  thyromegaly ?Respiratory: clear to auscultation bilaterally, no wheezing, no crackles. Normal respiratory effort. No accessory muscle use.  ?Cardiovascular: Regular rate and rhythm, no murmurs / rubs / gallo

## 2021-12-17 NOTE — Assessment & Plan Note (Addendum)
Status post mechanical fall with subsequent acute fracture of the proximal left femur. ?- EDP talked to Dr. Aline Brochure, plan for surgery 5/4, to allow for Eliquis washout. ?-Morphine 2 mg as needed ?- N/s 75cc/hr x 15hrs. ?-Preop EKG ?

## 2021-12-17 NOTE — ED Provider Notes (Signed)
?[facility] ?Provider Note ? ? ?CSN: [id number] ?Arrival date & time: 12/17/21  1347 ? ?  ? ?History ? ?Chief Complaint  ?Patient presents with  ? Hip Pain  ? Knee Pain  ? ? ?Monique Day is a 86 y.o. female. ? ? ?Hip Pain ? ?Knee Pain ?Patient presents for pain in area of left hip.  Earlier today, around midday, she was stepping off of the curb while leaving a restaurant.  She denies syncope but states that her leg gave out from under her.  This caused her to fall.  While she was on the ground, she noticed pain in the area of her left hip.  Pain is most prominent in the posterior aspect.  It radiates down her thigh.  Pain is worsened with movement.  She denies any other areas of pain or concern of injury.  Her medical history includes ulcerative colitis, DVT, anemia, breast cancer, osteopenia, and arthritis.  She is currently on Eliquis.  She is followed by Dr. Raliegh Ip and currently prescribed exemestane.  Her last dose of Eliquis was this morning.  Her last meal was lunch time.  Current pain is 3/10 in severity while at rest. ?  ? ?Home Medications ?Prior to Admission medications   ?Medication Sig Start Date End Date Taking? Authorizing Provider  ?Calcium Carb-Cholecalciferol (CALCIUM 600 + D PO) Take 1 tablet by mouth daily.   Yes [provider]  ?Calcium Carbonate-Vit D-Min (CALCIUM 600+D3 PLUS MINERALS) 600-800 MG-UNIT CHEW 1 tablet with a meal Orally Once a day   Yes [provider]  ?clobetasol (TEMOVATE) 0.05 % external solution Apply 1 application topically daily as needed (psoriasis). 05/30/21  Yes [provider]  ?dapagliflozin propanediol (FARXIGA) 10 MG TABS tablet 1 tablet Orally Once a day for 90 days   Yes [provider]  ?denosumab (PROLIA) 60 MG/ML SOLN injection Inject 60 mg into the skin every 6 (six) months. Administer in upper arm, thigh, or abdomen   Yes [provider]  ?ELIQUIS 2.5 MG TABS tablet Take 1 tablet (2.5 mg total)  by mouth 2 (two) times daily. 07/05/21  Yes Rehman, Mechele Dawley, MD  ?exemestane (AROMASIN) 25 MG tablet TAKE 1 TABLET DAILY AFTER BREAKFAST. 09/24/21  Yes Derek Jack, MD  ?FARXIGA 10 MG TABS tablet Take 10 mg by mouth daily. 06/26/20  Yes [provider]  ?HUMIRA PEN 40 MG/0.8ML PNKT INJECT 40 MG EVERY 14 DAYS AS DIRECTED. ?Patient taking differently: Inject 40 mg into the skin every 14 (fourteen) days. 04/08/21  Yes Carlan, Deatra Robinson, NP  ?Magnesium 250 MG TABS Take 250 mg by mouth daily.   Yes [provider]  ?OVER THE COUNTER MEDICATION Take 2-4 tablets by mouth 3 (three) times a week. Healthy Feet and Nerves otc supplement   Yes [provider]  ?polyethylene glycol (MIRALAX / GLYCOLAX) 17 g packet Take 17 g by mouth daily. 1 capful daily   Yes [provider]  ?PRESCRIPTION MEDICATION Apply 1 application topically daily as needed (irritation). Fluticasone 0.05 % and Ketoconazole 2% 1:1   Yes [provider]  ?   ? ?Allergies    ?Mercaptopurine and Sulfa antibiotics   ? ?Review of Systems   ?Review of Systems  ?Musculoskeletal:  Positive for arthralgias.  ?All other systems reviewed and are negative. ? ?Physical Exam ?Updated Vital Signs ?BP 133/65 (BP Location: Right Arm)   Pulse 78   Temp 98.3 ?F (36.8 ?C) (Oral)   Resp  17   Ht 5' 2"  (1.575 m)   Wt 77 kg   SpO2 94%   BMI 31.05 kg/m?  ?Physical Exam ?Vitals and nursing note reviewed.  ?Constitutional:   ?   General: She is not in acute distress. ?   Appearance: Normal appearance. She is well-developed and normal weight. She is not ill-appearing, toxic-appearing or diaphoretic.  ?HENT:  ?   Head: Normocephalic and atraumatic.  ?   Right Ear: External ear normal.  ?   Left Ear: External ear normal.  ?   Nose: Nose normal.  ?   Mouth/Throat:  ?   Mouth: Mucous membranes are moist.  ?   Pharynx: Oropharynx is clear.  ?Eyes:  ?   Extraocular Movements: Extraocular movements intact.  ?   Conjunctiva/sclera:  Conjunctivae normal.  ?Cardiovascular:  ?   Rate and Rhythm: Normal rate and regular rhythm.  ?   Heart sounds: No murmur heard. ?Pulmonary:  ?   Effort: Pulmonary effort is normal. No respiratory distress.  ?   Breath sounds: Normal breath sounds. No wheezing or rales.  ?Chest:  ?   Chest wall: No tenderness.  ?Abdominal:  ?   Palpations: Abdomen is soft.  ?   Tenderness: There is no abdominal tenderness.  ?Musculoskeletal:     ?   General: Deformity (Left leg shortening) present. No swelling.  ?   Cervical back: Normal range of motion and neck supple. No rigidity.  ?   Right lower leg: No edema.  ?   Left lower leg: No edema.  ?Skin: ?   General: Skin is warm and dry.  ?   Capillary Refill: Capillary refill takes less than 2 seconds.  ?   Coloration: Skin is not jaundiced or pale.  ?Neurological:  ?   General: No focal deficit present.  ?   Mental Status: She is alert and oriented to person, place, and time.  ?   Cranial Nerves: No cranial nerve deficit.  ?   Sensory: No sensory deficit.  ?   Motor: No weakness.  ?   Coordination: Coordination normal.  ?Psychiatric:     ?   Mood and Affect: Mood normal.     ?   Behavior: Behavior normal.     ?   Thought Content: Thought content normal.     ?   Judgment: Judgment normal.  ? ? ?ED Results / Procedures / Treatments   ?Labs ?(all labs ordered are listed, but only abnormal results are displayed) ?Labs Reviewed  ?COMPREHENSIVE METABOLIC PANEL - Abnormal; Notable for the following components:  ?    Result Value  ? Glucose, Bld 109 (*)   ? Creatinine, Ser 1.09 (*)   ? Calcium 8.5 (*)   ? Alkaline Phosphatase 24 (*)   ? GFR, Estimated 50 (*)   ? All other components within normal limits  ?SURGICAL PCR SCREEN  ?CBC  ?PROTIME-INR  ?VITAMIN D 25 HYDROXY (VIT D DEFICIENCY, FRACTURES)  ?CBC  ?TYPE AND SCREEN  ? ? ?EKG ?None ? ?Radiology ?DG C-Arm 1-60 Min-No Report ? ?Result Date: 12/19/2021 ?CLINICAL DATA:  LEFT hip fracture post ORIF. EXAM: DG HIP (WITH OR WITHOUT PELVIS) 2-3V  LEFT; DG C-ARM 1-60 MIN-NO REPORT COMPARISON:  Dec 17, 2021. FINDINGS: Intraoperative fluoroscopic images are provided 9 total images. Michelle images show comminuted intratrochanteric fracture with separate fragment of lesser troch. Subsequent images display insertion of an intramedullary device in the LEFT proximal femur with distal interlocking screw and dynamic  hip screw across the femoral neck. The entirety of the construct is not imaged in there is gas in the soft tissues as expected on these intra procedural images. IMPRESSION: Intraoperative fluoroscopic images during ORIF LEFT hip fracture. Limited views without unexpected findings full coverage of femoral nail not provided. Fluoroscopic dose: 34.52 mGy Fluoroscopic time: 1 minute 52 seconds. Electronically Signed   By: Zetta Bills M.D.   On: 12/19/2021 09:17  ? ?DG HIP UNILAT WITH PELVIS 2-3 VIEWS LEFT ? ?Result Date: 12/19/2021 ?CLINICAL DATA:  LEFT hip fracture post ORIF. EXAM: DG HIP (WITH OR WITHOUT PELVIS) 2-3V LEFT; DG C-ARM 1-60 MIN-NO REPORT COMPARISON:  Dec 17, 2021. FINDINGS: Intraoperative fluoroscopic images are provided 9 total images. Michelle images show comminuted intratrochanteric fracture with separate fragment of lesser troch. Subsequent images display insertion of an intramedullary device in the LEFT proximal femur with distal interlocking screw and dynamic hip screw across the femoral neck. The entirety of the construct is not imaged in there is gas in the soft tissues as expected on these intra procedural images. IMPRESSION: Intraoperative fluoroscopic images during ORIF LEFT hip fracture. Limited views without unexpected findings full coverage of femoral nail not provided. Fluoroscopic dose: 34.52 mGy Fluoroscopic time: 1 minute 52 seconds. Electronically Signed   By: Zetta Bills M.D.   On: 12/19/2021 09:17  ? ?DG FEMUR MIN 2 VIEWS LEFT ? ?Result Date: 12/19/2021 ?CLINICAL DATA:  Postop. EXAM: LEFT FEMUR 2 VIEWS COMPARISON:  LEFT hip  XRs, 12/17/2021. Intraoperative fluoroscopic imaging, 12/19/2021. FINDINGS: Intramedullary nail LEFT proximal femoral fixation, in anatomic alignment. No periprosthetic lucency or fracture. Displaced lesser tr

## 2021-12-18 LAB — CBC
HCT: 40.3 % (ref 36.0–46.0)
Hemoglobin: 13.6 g/dL (ref 12.0–15.0)
MCH: 32.5 pg (ref 26.0–34.0)
MCHC: 33.7 g/dL (ref 30.0–36.0)
MCV: 96.4 fL (ref 80.0–100.0)
Platelets: 172 10*3/uL (ref 150–400)
RBC: 4.18 MIL/uL (ref 3.87–5.11)
RDW: 13.2 % (ref 11.5–15.5)
WBC: 8.7 10*3/uL (ref 4.0–10.5)
nRBC: 0 % (ref 0.0–0.2)

## 2021-12-18 LAB — COMPREHENSIVE METABOLIC PANEL
ALT: 16 U/L (ref 0–44)
AST: 17 U/L (ref 15–41)
Albumin: 3.7 g/dL (ref 3.5–5.0)
Alkaline Phosphatase: 24 U/L — ABNORMAL LOW (ref 38–126)
Anion gap: 6 (ref 5–15)
BUN: 21 mg/dL (ref 8–23)
CO2: 25 mmol/L (ref 22–32)
Calcium: 8.5 mg/dL — ABNORMAL LOW (ref 8.9–10.3)
Chloride: 105 mmol/L (ref 98–111)
Creatinine, Ser: 1.09 mg/dL — ABNORMAL HIGH (ref 0.44–1.00)
GFR, Estimated: 50 mL/min — ABNORMAL LOW (ref >60)
Glucose, Bld: 109 mg/dL — ABNORMAL HIGH (ref 70–99)
Potassium: 3.9 mmol/L (ref 3.5–5.1)
Sodium: 136 mmol/L (ref 135–145)
Total Bilirubin: 0.5 mg/dL (ref 0.3–1.2)
Total Protein: 7 g/dL (ref 6.5–8.1)

## 2021-12-18 LAB — PROTIME-INR
INR: 1.1 (ref 0.8–1.2)
Prothrombin Time: 14.2 seconds (ref 11.4–15.2)

## 2021-12-18 LAB — VITAMIN D 25 HYDROXY (VIT D DEFICIENCY, FRACTURES): Vit D, 25-Hydroxy: 31.6 ng/mL (ref 30–100)

## 2021-12-18 NOTE — Consult Note (Signed)
Reason for Consult: Left hip fracture ?Referring Physician: Dr. Vance Gather ? ?Monique Day is an 86 y.o. female.  ?HPI: This is an 86 year old female with history of mechanical fall had a stroke in 2020 it was mild she had a history of ulcerative colitis after which she had a blood clot so she has an IVC filter she is on blood thinners as well ? ?Presents after mechanical fall complaining of mild left hip pain deformity and inability to weight-bear.  Her pain increases with movement.  She also has some pain just below the knee on the lateral portion of the calf since the injury. ? ?Past Medical History:  ?Diagnosis Date  ? Adult idiopathic generalized osteoporosis   ? Arthritis   ? "minor" (08/18/2018)  ? Breast cancer, left breast ([place]) 1998  ? lumpectomy  ? Breast cancer, right breast [facility]) 2009  ? lumpectomy; mastectomy  ? DVT (deep venous thrombosis) ([place]) "early 2000's"  ? RLE  ? DVT (deep venous thrombosis) ([facility])   ? Hemorrhoids, external   ? History of blood transfusion   ? "related to ulcerative colitis"  ? Lower abdominal pain   ? Occult blood in stools   ? Peripheral edema   ? Seizures ([place])   ? one very slight seizure with the stroke; somewhere between 2005-2009  ? Stroke [facility]) 2005-2009  ? "very light;" ; denies residual on 08/19/2018  ? UC (ulcerative colitis) ([place]) dx'd 1957  ? ? ?Past Surgical History:  ?Procedure Laterality Date  ? APPENDECTOMY    ? BIOPSY  04/14/2019  ? Procedure: BIOPSY;  Surgeon: [name], MD;  [facility];  Service: Endoscopy;;  ? bone density  12/11/10  ? BREAST BIOPSY Left 1998; 2019 X 2  ? BREAST BIOPSY Right 2009  ? BREAST LUMPECTOMY Left 1998  ? CATARACT EXTRACTION W/ INTRAOCULAR LENS  IMPLANT, BILATERAL Bilateral   ? COLONOSCOPY  02/21/2011  ? COLONOSCOPY  10/06/2008  ? COLONOSCOPY  12/27/01  ? COLONOSCOPY  05/09/05  ? COLONOSCOPY N/A 07/02/2016  ? Procedure: COLONOSCOPY;  Surgeon: [name], MD;  [facility]: [facility];  Service: Endoscopy;   Laterality: N/A;  1055  ? COLONOSCOPY N/A 07/04/2021  ? Procedure: COLONOSCOPY;  Surgeon: [name], MD;  [facility];  Service: Endoscopy;  Laterality: N/A;  7:30  ? [place]  ? "I was having rectal bleeding"  ? FLEXIBLE SIGMOIDOSCOPY N/A 04/14/2019  ? Procedure: FLEXIBLE SIGMOIDOSCOPY;  Surgeon: [name], MD;  [facility];  Service: Endoscopy;  Laterality: N/A;  1:00  ? FRACTURE SURGERY    ? MASTECTOMY Right 2009  ? MASTECTOMY COMPLETE / SIMPLE Left 08/19/2018  ? POLYPECTOMY  07/04/2021  ? Procedure: POLYPECTOMY;  Surgeon: [name], MD;  [facility];  Service: Endoscopy;;  ? PORTACATH PLACEMENT Right 10/15/2018  ? Procedure: INSERTION PORT-A-CATH RIGHT SUBCLAVIN;  Surgeon: [name], MD;  Location: [facility] ORS;  Service: General;  Laterality: Right;  pt knows to arrive at 7:30  ? SIMPLE MASTECTOMY WITH AXILLARY SENTINEL NODE BIOPSY Left 08/19/2018  ? Procedure: LEFT SIMPLE MASTECTOMY;  Surgeon: [name], MD;  Location: [place];  Service: General;  Laterality: Left;  ? TONSILLECTOMY    ? VENA CAVA FILTER PLACEMENT Right   ? WRIST FRACTURE SURGERY Right   ? ? ?Family History  ?Problem Relation Age of Onset  ? Prostate cancer Father   ? Colon cancer Neg Hx   ? ? ?  Social History:  reports that she quit smoking about 20 years ago. Her smoking use included cigarettes. She has a 80.00 pack-year smoking history. She has never used smokeless tobacco. She reports current alcohol use. She reports that she does not use drugs. ? ?Allergies:  ?Allergies  ?Allergen Reactions  ? Mercaptopurine Other (See Comments)  ?  Dropped WBC  ? Sulfa Antibiotics Other (See Comments)  ?  Extreme Weakness  ? ? ?Medications:  ?Meds ordered this encounter  ?Medications  ? acetaminophen (TYLENOL) tablet 650 mg  ? DISCONTD: acetaminophen (TYLENOL) tablet 650 mg  ? DISCONTD: oxyCODONE (Oxy IR/ROXICODONE) immediate release tablet 5 mg  ? morphine (PF) 2 MG/ML injection 2  mg  ? OR Linked Order Group  ?  acetaminophen (TYLENOL) tablet 650 mg  ?  acetaminophen (TYLENOL) suppository 650 mg  ? OR Linked Order Group  ?  ondansetron (ZOFRAN) tablet 4 mg  ?  ondansetron (ZOFRAN) injection 4 mg  ? polyethylene glycol (MIRALAX / GLYCOLAX) packet 17 g  ? ? ?Current Facility-Administered Medications:  ?  acetaminophen (TYLENOL) tablet 650 mg, 650 mg, Oral, Q6H PRN, 650 mg at 12/17/21 2325 **OR** acetaminophen (TYLENOL) suppository 650 mg, 650 mg, Rectal, Q6H PRN, [name], [name], MD ?  morphine (PF) 2 MG/ML injection 2 mg, 2 mg, Intravenous, Q4H PRN, [name], [name], MD ?  ondansetron (ZOFRAN) tablet 4 mg, 4 mg, Oral, Q6H PRN **OR** ondansetron (ZOFRAN) injection 4 mg, 4 mg, Intravenous, Q6H PRN, [name], [name], MD ?  polyethylene glycol (MIRALAX / GLYCOLAX) packet 17 g, 17 g, Oral, Daily PRN, [name], [name], MD ? ?Facility-Administered Medications Ordered in Other Encounters:  ?  sodium chloride flush (NS) 0.9 % injection 10 mL, 10 mL, Intravenous, PRN, Derek Jack, MD, 10 mL at 07/24/21 1429 ? ? ?No results found for this or any previous visit (from the past 48 hour(s)). ? ?DG Chest 1 View ? ?Result Date: 12/17/2021 ?CLINICAL DATA:  Status post fall. EXAM: CHEST  1 VIEW COMPARISON:  October 15, 2018 FINDINGS: There is stable right-sided venous Port-A-Cath positioning. The heart size and mediastinal contours are within normal limits. Mild, diffuse, chronic appearing increased interstitial lung markings are seen. There is no evidence of focal consolidation, pleural effusion or pneumothorax. Bilateral axillary surgical clips are noted. Degenerative changes seen throughout the thoracic spine. IMPRESSION: Chronic appearing increased interstitial lung markings without acute or active cardiopulmonary disease. Electronically Signed   By: Virgina Norfolk M.D.   On: 12/17/2021 17:08  ? ?DG Hip Unilat W or Wo Pelvis 2-3 Views Left ? ?Result Date: 12/17/2021 ?CLINICAL  DATA:  Status post fall with left hip pain. EXAM: DG HIP (WITH OR WITHOUT PELVIS) 2-3V LEFT COMPARISON:  None Available. FINDINGS: An acute fracture deformity is seen extending through the inter trochanteric region of the proximal left femur. There is no evidence of dislocation. Moderate to marked severity degenerative changes are seen involving the bilateral hips, in the form of joint space narrowing and acetabular formation. IMPRESSION: Acute fracture of the proximal left femur. Electronically Signed   By: Virgina Norfolk M.D.   On: 12/17/2021 17:06   ? ?Review of Systems ?She is very healthy she is very active she says she is on a lot of volunteer organizations ? ?No problems with her in terms of fever chills she does wear glasses no hearing issues no nasal rhinorrhea no throat issues.  No chest pain shortness of breath no diarrhea no urinary problems mild arthralgias no skin issues  normal sensation no history of depression.  No frequent eating frequent thirst the bleeding issues described and no major allergies to environmental irritants ? ? ?Blood pressure (!) 147/62, pulse 76, temperature 98.5 ?F (36.9 ?C), temperature source Oral, resp. rate 18, height 5' 2"  (1.575 m), weight 77 kg, SpO2 95 %. ?Physical Exam ? ?Vitals stable as above, appearance normal ? ?Cardiovascular no swelling or varicosities pulses are intact temperature normal no edema or tenderness ? ?Lymphatics negative in the lower extremities ? ?Skin normal all 4 extremities ? ?Upper extremity coordination normal with finger-to-nose testing ?Upper extremities reflexes intact sensation normal all 4 extremities ? ?Psychiatric alert and oriented x3 no depression anxiety or agitation ? ?Musculoskeletal gait patient unable to ambulate secondary to hip fracture ? ?Right and left upper extremity on inspection and palpation no tenderness or swelling range of motion is normal all joints are reduced and muscle tone and strength are good ? ?Right lower  extremity normal without deformity no contractures no subluxation of the joints and strength and muscle tone normal skin intact as well ? ?Left lower extremity is actually in good position no evidence of exter

## 2021-12-18 NOTE — Progress Notes (Signed)
?  Transition of Care (TOC) Screening Note ? ? ?Patient Details  ?Name: Monique Day ?Date of Birth: 1935/10/09 ? ? ?Transition of Care (TOC) CM/SW Contact:    ?Iona Beard, LCSWA ?Phone Number: ?12/18/2021, 11:03 AM ? ?TOC to follow for PT evaluation recommendations. ? ?Transition of Care Department [facility]) has reviewed patient and no TOC needs have been identified at this time. We will continue to monitor patient advancement through interdisciplinary progression rounds. If new patient transition needs arise, please place a TOC consult. ?  ?

## 2021-12-18 NOTE — H&P (View-Only) (Signed)
Reason for Consult: Left hip fracture ?Referring Physician: Dr. Vance Gather ? ?Monique Day is an 86 y.o. female.  ?HPI: This is an 86 year old female with history of mechanical fall had a stroke in 2020 it was mild she had a history of ulcerative colitis after which she had a blood clot so she has an IVC filter she is on blood thinners as well ? ?Presents after mechanical fall complaining of mild left hip pain deformity and inability to weight-bear.  Her pain increases with movement.  She also has some pain just below the knee on the lateral portion of the calf since the injury. ? ?Past Medical History:  ?Diagnosis Date  ? Adult idiopathic generalized osteoporosis   ? Arthritis   ? "minor" (08/18/2018)  ? Breast cancer, left breast ([place]) 1998  ? lumpectomy  ? Breast cancer, right breast [facility]) 2009  ? lumpectomy; mastectomy  ? DVT (deep venous thrombosis) ([place]) "early 2000's"  ? RLE  ? DVT (deep venous thrombosis) ([facility])   ? Hemorrhoids, external   ? History of blood transfusion   ? "related to ulcerative colitis"  ? Lower abdominal pain   ? Occult blood in stools   ? Peripheral edema   ? Seizures ([place])   ? one very slight seizure with the stroke; somewhere between 2005-2009  ? Stroke [facility]) 2005-2009  ? "very light;" ; denies residual on 08/19/2018  ? UC (ulcerative colitis) ([place]) dx'd 1957  ? ? ?Past Surgical History:  ?Procedure Laterality Date  ? APPENDECTOMY    ? BIOPSY  04/14/2019  ? Procedure: BIOPSY;  Surgeon: [name], MD;  [facility];  Service: Endoscopy;;  ? bone density  12/11/10  ? BREAST BIOPSY Left 1998; 2019 X 2  ? BREAST BIOPSY Right 2009  ? BREAST LUMPECTOMY Left 1998  ? CATARACT EXTRACTION W/ INTRAOCULAR LENS  IMPLANT, BILATERAL Bilateral   ? COLONOSCOPY  02/21/2011  ? COLONOSCOPY  10/06/2008  ? COLONOSCOPY  12/27/01  ? COLONOSCOPY  05/09/05  ? COLONOSCOPY N/A 07/02/2016  ? Procedure: COLONOSCOPY;  Surgeon: [name], MD;  Location: [facility];  Service: Endoscopy;   Laterality: N/A;  1055  ? COLONOSCOPY N/A 07/04/2021  ? Procedure: COLONOSCOPY;  Surgeon: [name], MD;  [facility];  Service: Endoscopy;  Laterality: N/A;  7:30  ? [place]  ? "I was having rectal bleeding"  ? FLEXIBLE SIGMOIDOSCOPY N/A 04/14/2019  ? Procedure: FLEXIBLE SIGMOIDOSCOPY;  Surgeon: [name], MD;  Location: [facility];  Service: Endoscopy;  Laterality: N/A;  1:00  ? FRACTURE SURGERY    ? MASTECTOMY Right 2009  ? MASTECTOMY COMPLETE / SIMPLE Left 08/19/2018  ? POLYPECTOMY  07/04/2021  ? Procedure: POLYPECTOMY;  Surgeon: [name], MD;  Location[facility];  Service: Endoscopy;;  ? PORTACATH PLACEMENT Right 10/15/2018  ? Procedure: INSERTION PORT-A-CATH RIGHT SUBCLAVIN;  Surgeon: [name], MD;  Location: [facility];  Service: General;  Laterality: Right;  pt knows to arrive at 7:30  ? SIMPLE MASTECTOMY WITH AXILLARY SENTINEL NODE BIOPSY Left 08/19/2018  ? Procedure: LEFT SIMPLE MASTECTOMY;  Surgeon: [name], MD;  Location: [place];  Service: General;  Laterality: Left;  ? TONSILLECTOMY    ? VENA CAVA FILTER PLACEMENT Right   ? WRIST FRACTURE SURGERY Right   ? ? ?Family History  ?Problem Relation Age of Onset  ? Prostate cancer Father   ? Colon cancer Neg Hx   ? ? ?  Social History:  reports that she quit smoking about 20 years ago. Her smoking use included cigarettes. She has a 80.00 pack-year smoking history. She has never used smokeless tobacco. She reports current alcohol use. She reports that she does not use drugs. ? ?Allergies:  ?Allergies  ?Allergen Reactions  ? Mercaptopurine Other (See Comments)  ?  Dropped WBC  ? Sulfa Antibiotics Other (See Comments)  ?  Extreme Weakness  ? ? ?Medications:  ?Meds ordered this encounter  ?Medications  ? acetaminophen (TYLENOL) tablet 650 mg  ? DISCONTD: acetaminophen (TYLENOL) tablet 650 mg  ? DISCONTD: oxyCODONE (Oxy IR/ROXICODONE) immediate release tablet 5 mg  ? morphine (PF) 2 MG/ML injection 2  mg  ? OR Linked Order Group  ?  acetaminophen (TYLENOL) tablet 650 mg  ?  acetaminophen (TYLENOL) suppository 650 mg  ? OR Linked Order Group  ?  ondansetron (ZOFRAN) tablet 4 mg  ?  ondansetron (ZOFRAN) injection 4 mg  ? polyethylene glycol (MIRALAX / GLYCOLAX) packet 17 g  ? ? ?Current Facility-Administered Medications:  ?  acetaminophen (TYLENOL) tablet 650 mg, 650 mg, Oral, Q6H PRN, 650 mg at 12/17/21 2325 **OR** acetaminophen (TYLENOL) suppository 650 mg, 650 mg, Rectal, Q6H PRN, [name], [name], MD ?  morphine (PF) 2 MG/ML injection 2 mg, 2 mg, Intravenous, Q4H PRN, [name], [name], MD ?  ondansetron (ZOFRAN) tablet 4 mg, 4 mg, Oral, Q6H PRN **OR** ondansetron (ZOFRAN) injection 4 mg, 4 mg, Intravenous, Q6H PRN, [name], [name], MD ?  polyethylene glycol (MIRALAX / GLYCOLAX) packet 17 g, 17 g, Oral, Daily PRN, [name], [name], MD ? ?Facility-Administered Medications Ordered in Other Encounters:  ?  sodium chloride flush (NS) 0.9 % injection 10 mL, 10 mL, Intravenous, PRN, Derek Jack, MD, 10 mL at 07/24/21 1429 ? ? ?No results found for this or any previous visit (from the past 48 hour(s)). ? ?DG Chest 1 View ? ?Result Date: 12/17/2021 ?CLINICAL DATA:  Status post fall. EXAM: CHEST  1 VIEW COMPARISON:  October 15, 2018 FINDINGS: There is stable right-sided venous Port-A-Cath positioning. The heart size and mediastinal contours are within normal limits. Mild, diffuse, chronic appearing increased interstitial lung markings are seen. There is no evidence of focal consolidation, pleural effusion or pneumothorax. Bilateral axillary surgical clips are noted. Degenerative changes seen throughout the thoracic spine. IMPRESSION: Chronic appearing increased interstitial lung markings without acute or active cardiopulmonary disease. Electronically Signed   By: Virgina Norfolk M.D.   On: 12/17/2021 17:08  ? ?DG Hip Unilat W or Wo Pelvis 2-3 Views Left ? ?Result Date: 12/17/2021 ?CLINICAL  DATA:  Status post fall with left hip pain. EXAM: DG HIP (WITH OR WITHOUT PELVIS) 2-3V LEFT COMPARISON:  None Available. FINDINGS: An acute fracture deformity is seen extending through the inter trochanteric region of the proximal left femur. There is no evidence of dislocation. Moderate to marked severity degenerative changes are seen involving the bilateral hips, in the form of joint space narrowing and acetabular formation. IMPRESSION: Acute fracture of the proximal left femur. Electronically Signed   By: Virgina Norfolk M.D.   On: 12/17/2021 17:06   ? ?Review of Systems ?She is very healthy she is very active she says she is on a lot of volunteer organizations ? ?No problems with her in terms of fever chills she does wear glasses no hearing issues no nasal rhinorrhea no throat issues.  No chest pain shortness of breath no diarrhea no urinary problems mild arthralgias no skin issues  normal sensation no history of depression.  No frequent eating frequent thirst the bleeding issues described and no major allergies to environmental irritants ? ? ?Blood pressure (!) 147/62, pulse 76, temperature 98.5 ?F (36.9 ?C), temperature source Oral, resp. rate 18, height 5' 2"  (1.575 m), weight 77 kg, SpO2 95 %. ?Physical Exam ? ?Vitals stable as above, appearance normal ? ?Cardiovascular no swelling or varicosities pulses are intact temperature normal no edema or tenderness ? ?Lymphatics negative in the lower extremities ? ?Skin normal all 4 extremities ? ?Upper extremity coordination normal with finger-to-nose testing ?Upper extremities reflexes intact sensation normal all 4 extremities ? ?Psychiatric alert and oriented x3 no depression anxiety or agitation ? ?Musculoskeletal gait patient unable to ambulate secondary to hip fracture ? ?Right and left upper extremity on inspection and palpation no tenderness or swelling range of motion is normal all joints are reduced and muscle tone and strength are good ? ?Right lower  extremity normal without deformity no contractures no subluxation of the joints and strength and muscle tone normal skin intact as well ? ?Left lower extremity is actually in good position no evidence of exter

## 2021-12-18 NOTE — Progress Notes (Signed)
Patient ID: Monique Day, female   DOB: Jan 07, 1936, 86 y.o.   MRN: [id number] ? ? ?86 year old female mechanical fall fractured her left hip she has a three-part intertrochanteric fracture which requires internal fixation with intramedullary rod ? ?Patient is known to be on anticoagulation Eliquis 2.5 mg twice a day ? ?She took her last dose yesterday ? ?She should be okay for surgery from a hematologic standpoint on Thursday ? ?Recommend intramedullary nailing ?

## 2021-12-18 NOTE — Progress Notes (Signed)
?  Progress Note ? ?Patient: Monique Day [id number]:[id number] DOB: 1935-08-26  ?DOA: 12/17/2021  DOS: 12/18/2021  ?  ?Brief hospital course: ?Monique Day is an 86 y.o. 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 XR confirmed proximal left femur fracture for which orthopedics is planning IM nail on 5/4 after eliquis washout.  ? ?Assessment and Plan: ?Closed intertrochanteric left hip fracture: ?if this could be fragility fracture that led to the fall as opposed to vice versa.  ?- IM nail planned 5/4 per Dr. Aline Brochure ?- Continue pain control as ordered.  ?- PT/OT after surgery  ?- Will check ECG and labs. ? ?Recurrent DVTs: Has not had an acute DVT in years. Has IVC filter placed in setting of hemorrhagic colitis related to UC.  ?- Hold eliquis. Shared decision making with patient, we will not initiate bridging anticoagulation since no active DVT. ? ?UC:  ?- Continue outpatient humira and follow up with Dr. Laural Golden.  ? ?Obesity: Estimated body mass index is 31.05 kg/m? as calculated from the following: ?  Height as of this encounter: 5' 2"  (1.575 m). ?  Weight as of this encounter: 77 kg. ? ?Subjective: Pain controlled in left hip at rest. No new issues. Denies bleeding or leg swelling.  ? ?Objective: ?Vitals:  ? 12/17/21 2014 12/18/21 0047 12/18/21 0403 12/18/21 0175  ?BP: (!) 158/72 136/63 (!) 147/62 (!) 145/74  ?Pulse: 88 86 76 88  ?Resp: 18 17 18    ?Temp: 98.3 ?F (36.8 ?C) 98.2 ?F (36.8 ?C) 98.5 ?F (36.9 ?C) 98.3 ?F (36.8 ?C)  ?TempSrc: Oral Oral Oral Oral  ?SpO2: 97% 96% 95% 94%  ?Weight:      ?Height:      ? ?Gen: Elderly, pleasant female in no distress ?Pulm: Nonlabored breathing room air. Clear ?CV: Regular rate and rhythm. No murmur, rub, or gallop. No JVD, no dependent edema. ?GI: Abdomen soft, non-tender, non-distended, with  normoactive bowel sounds.  ?Ext: Warm, LLE shortened and externally rotated without other deformities ?Skin: No rashes, lesions or ulcers on visualized skin. ?Neuro: Alert and oriented. No focal neurological deficits. ?Psych: Judgement and insight appear fair. Mood euthymic & affect congruent. Behavior is appropriate.   ? ?Data Personally reviewed:   ?CXR 1v: Chronic appearing increased interstitial lung markings without acute or active cardiopulmonary disease.  ? ?L Hip XR: Acute fracture of the proximal left femur.   ? ?Family Communication: None at bedside ? ?Disposition: ?Status is: Inpatient ?Remains inpatient appropriate because: Requires operative management of femur fracture and postoperative PT/OT evaluation ?Planned Discharge Destination:  TBD ? ?Patrecia Pour, MD ?12/18/2021 1:47 PM ?Page by [email or web address]  ?

## 2021-12-19 ENCOUNTER — Inpatient Hospital Stay (HOSPITAL_COMMUNITY): Payer: Medicare PPO | Admitting: Certified Registered"

## 2021-12-19 ENCOUNTER — Encounter (HOSPITAL_COMMUNITY): Payer: Self-pay | Admitting: Internal Medicine

## 2021-12-19 ENCOUNTER — Encounter (HOSPITAL_COMMUNITY)
Admission: EM | Disposition: A | Discharge status: Skilled Nursing Facility | Payer: Self-pay | Source: Home / Self Care | Attending: Family Medicine

## 2021-12-19 ENCOUNTER — Other Ambulatory Visit: Payer: Self-pay

## 2021-12-19 ENCOUNTER — Inpatient Hospital Stay (HOSPITAL_COMMUNITY): Payer: Medicare PPO

## 2021-12-19 DIAGNOSIS — K279 Peptic ulcer, site unspecified, unspecified as acute or chronic, without hemorrhage or perforation: Secondary | ICD-10-CM

## 2021-12-19 DIAGNOSIS — Z8673 Personal history of transient ischemic attack (TIA), and cerebral infarction without residual deficits: Secondary | ICD-10-CM

## 2021-12-19 DIAGNOSIS — D649 Anemia, unspecified: Secondary | ICD-10-CM

## 2021-12-19 DIAGNOSIS — S72142A Displaced intertrochanteric fracture of left femur, initial encounter for closed fracture: Secondary | ICD-10-CM

## 2021-12-19 HISTORY — PX: INTRAMEDULLARY (IM) NAIL INTERTROCHANTERIC: SHX5875

## 2021-12-19 LAB — TYPE AND SCREEN
ABO/RH(D): A POS
Antibody Screen: NEGATIVE

## 2021-12-19 LAB — SURGICAL PCR SCREEN
MRSA, PCR: NEGATIVE
Staphylococcus aureus: NEGATIVE

## 2021-12-19 SURGERY — FIXATION, FRACTURE, INTERTROCHANTERIC, WITH INTRAMEDULLARY ROD
Anesthesia: General | Site: Hip | Laterality: Left

## 2021-12-19 MED ORDER — CEFAZOLIN SODIUM-DEXTROSE 2-4 GM/100ML-% IV SOLN
INTRAVENOUS | Status: AC
  Filled 2021-12-19: qty 100

## 2021-12-19 MED ORDER — METOCLOPRAMIDE HCL 5 MG/ML IJ SOLN: 5.0000 mg | Freq: Three times a day (TID) | INTRAMUSCULAR | Status: DC | PRN

## 2021-12-19 MED ORDER — POVIDONE-IODINE 10 % EX SWAB: 2.0000 "application " | Freq: Once | CUTANEOUS | Status: DC

## 2021-12-19 MED ORDER — CHLORHEXIDINE GLUCONATE CLOTH 2 % EX PADS
6.0000 | MEDICATED_PAD | Freq: Every day | CUTANEOUS | Status: DC
  Administered 2021-12-19: 6 via TOPICAL

## 2021-12-19 MED ORDER — PROPOFOL 10 MG/ML IV BOLUS
INTRAVENOUS | Status: AC
  Filled 2021-12-19: qty 20

## 2021-12-19 MED ORDER — TRANEXAMIC ACID-NACL 1000-0.7 MG/100ML-% IV SOLN
1000.0000 mg | Freq: Once | INTRAVENOUS | Status: AC
  Administered 2021-12-19: 1000 mg via INTRAVENOUS
  Filled 2021-12-19: qty 100

## 2021-12-19 MED ORDER — CHLORHEXIDINE GLUCONATE 4 % EX LIQD
CUTANEOUS | Status: AC
  Filled 2021-12-19: qty 15

## 2021-12-19 MED ORDER — ONDANSETRON HCL 4 MG/2ML IJ SOLN
INTRAMUSCULAR | Status: DC | PRN
  Administered 2021-12-19: 4 mg via INTRAVENOUS

## 2021-12-19 MED ORDER — TRANEXAMIC ACID-NACL 1000-0.7 MG/100ML-% IV SOLN
1000.0000 mg | INTRAVENOUS | Status: AC
  Administered 2021-12-19: 1000 mg via INTRAVENOUS

## 2021-12-19 MED ORDER — BUPIVACAINE LIPOSOME 1.3 % IJ SUSP
INTRAMUSCULAR | Status: DC | PRN
  Administered 2021-12-19: 20 mL

## 2021-12-19 MED ORDER — DOCUSATE SODIUM 100 MG PO CAPS
100.0000 mg | ORAL_CAPSULE | Freq: Two times a day (BID) | ORAL | Status: DC
  Administered 2021-12-19 – 2021-12-20 (×4): 100 mg via ORAL
  Filled 2021-12-19 (×5): qty 1

## 2021-12-19 MED ORDER — ONDANSETRON HCL 4 MG/2ML IJ SOLN: 4.0000 mg | Freq: Four times a day (QID) | INTRAMUSCULAR | Status: DC | PRN

## 2021-12-19 MED ORDER — PHENYLEPHRINE 80 MCG/ML (10ML) SYRINGE FOR IV PUSH (FOR BLOOD PRESSURE SUPPORT)
PREFILLED_SYRINGE | INTRAVENOUS | Status: DC | PRN
  Administered 2021-12-19 (×3): 80 ug via INTRAVENOUS

## 2021-12-19 MED ORDER — POLYETHYLENE GLYCOL 3350 17 G PO PACK: 17.0000 g | PACK | Freq: Every day | ORAL | Status: DC | PRN

## 2021-12-19 MED ORDER — CHLORHEXIDINE GLUCONATE 4 % EX LIQD
60.0000 mL | Freq: Once | CUTANEOUS | Status: AC
  Administered 2021-12-19: 4 via TOPICAL

## 2021-12-19 MED ORDER — ONDANSETRON HCL 4 MG/2ML IJ SOLN: 4.0000 mg | Freq: Once | INTRAMUSCULAR | Status: DC | PRN

## 2021-12-19 MED ORDER — POLYETHYLENE GLYCOL 3350 17 G PO PACK
17.0000 g | PACK | Freq: Every day | ORAL | Status: DC
  Administered 2021-12-19 – 2021-12-20 (×2): 17 g via ORAL
  Filled 2021-12-19 (×3): qty 1

## 2021-12-19 MED ORDER — FENTANYL CITRATE PF 50 MCG/ML IJ SOSY
25.0000 ug | PREFILLED_SYRINGE | INTRAMUSCULAR | Status: DC | PRN
  Administered 2021-12-19: 50 ug via INTRAVENOUS
  Filled 2021-12-19: qty 1

## 2021-12-19 MED ORDER — MORPHINE SULFATE (PF) 2 MG/ML IV SOLN: 0.5000 mg | INTRAVENOUS | Status: DC | PRN

## 2021-12-19 MED ORDER — CHLORHEXIDINE GLUCONATE 0.12 % MT SOLN
15.0000 mL | Freq: Once | OROMUCOSAL | Status: AC
  Administered 2021-12-19: 15 mL via OROMUCOSAL

## 2021-12-19 MED ORDER — ADALIMUMAB 40 MG/0.8ML ~~LOC~~ AJKT: 40.0000 mg | AUTO-INJECTOR | SUBCUTANEOUS | Status: DC

## 2021-12-19 MED ORDER — STERILE WATER FOR IRRIGATION IR SOLN
Status: DC | PRN
  Administered 2021-12-19: 1000 mL

## 2021-12-19 MED ORDER — HYDROCODONE-ACETAMINOPHEN 5-325 MG PO TABS: 1.0000 | ORAL_TABLET | ORAL | Status: DC | PRN

## 2021-12-19 MED ORDER — LACTATED RINGERS IV SOLN: INTRAVENOUS | Status: DC

## 2021-12-19 MED ORDER — ORAL CARE MOUTH RINSE: 15.0000 mL | Freq: Once | OROMUCOSAL | Status: AC

## 2021-12-19 MED ORDER — BUPIVACAINE LIPOSOME 1.3 % IJ SUSP
INTRAMUSCULAR | Status: AC
  Filled 2021-12-19: qty 20

## 2021-12-19 MED ORDER — TRAMADOL HCL 50 MG PO TABS
50.0000 mg | ORAL_TABLET | Freq: Four times a day (QID) | ORAL | Status: DC
  Administered 2021-12-19 – 2021-12-21 (×6): 50 mg via ORAL
  Filled 2021-12-19 (×6): qty 1

## 2021-12-19 MED ORDER — DEXAMETHASONE SODIUM PHOSPHATE 10 MG/ML IJ SOLN
INTRAMUSCULAR | Status: DC | PRN
Start: 2021-12-19 — End: 2021-12-19
  Administered 2021-12-19: 10 mg via INTRAVENOUS

## 2021-12-19 MED ORDER — OYSTER SHELL CALCIUM/D3 500-5 MG-MCG PO TABS
1.0000 | ORAL_TABLET | Freq: Every day | ORAL | Status: DC
  Administered 2021-12-19 – 2021-12-21 (×3): 1 via ORAL
  Filled 2021-12-19 (×3): qty 1

## 2021-12-19 MED ORDER — DAPAGLIFLOZIN PROPANEDIOL 10 MG PO TABS
10.0000 mg | ORAL_TABLET | Freq: Every day | ORAL | Status: DC
  Administered 2021-12-19 – 2021-12-21 (×3): 10 mg via ORAL
  Filled 2021-12-19 (×5): qty 1

## 2021-12-19 MED ORDER — ACETAMINOPHEN 500 MG PO TABS
500.0000 mg | ORAL_TABLET | Freq: Four times a day (QID) | ORAL | Status: AC
  Administered 2021-12-19 – 2021-12-20 (×4): 500 mg via ORAL
  Filled 2021-12-19 (×4): qty 1

## 2021-12-19 MED ORDER — SODIUM CHLORIDE 0.9 % IR SOLN
Status: DC | PRN
  Administered 2021-12-19: 1000 mL

## 2021-12-19 MED ORDER — CEFAZOLIN SODIUM-DEXTROSE 2-4 GM/100ML-% IV SOLN
2.0000 g | INTRAVENOUS | Status: AC
  Administered 2021-12-19: 2 g via INTRAVENOUS

## 2021-12-19 MED ORDER — CEFAZOLIN SODIUM-DEXTROSE 2-4 GM/100ML-% IV SOLN
2.0000 g | Freq: Four times a day (QID) | INTRAVENOUS | Status: AC
  Administered 2021-12-19 (×2): 2 g via INTRAVENOUS
  Filled 2021-12-19 (×2): qty 100

## 2021-12-19 MED ORDER — METHOCARBAMOL 500 MG PO TABS: 500.0000 mg | ORAL_TABLET | Freq: Four times a day (QID) | ORAL | Status: DC | PRN

## 2021-12-19 MED ORDER — MAGNESIUM OXIDE -MG SUPPLEMENT 400 (240 MG) MG PO TABS
200.0000 mg | ORAL_TABLET | Freq: Every day | ORAL | Status: DC
  Administered 2021-12-19 – 2021-12-21 (×3): 200 mg via ORAL
  Filled 2021-12-19 (×3): qty 1

## 2021-12-19 MED ORDER — METHOCARBAMOL 1000 MG/10ML IJ SOLN
500.0000 mg | Freq: Four times a day (QID) | INTRAVENOUS | Status: DC | PRN
  Filled 2021-12-19: qty 5

## 2021-12-19 MED ORDER — SODIUM CHLORIDE 0.9 % IV SOLN: INTRAVENOUS | Status: DC

## 2021-12-19 MED ORDER — HYDROCODONE-ACETAMINOPHEN 7.5-325 MG PO TABS: 1.0000 | ORAL_TABLET | ORAL | Status: DC | PRN

## 2021-12-19 MED ORDER — ACETAMINOPHEN 325 MG PO TABS
325.0000 mg | ORAL_TABLET | Freq: Four times a day (QID) | ORAL | Status: DC | PRN
  Administered 2021-12-20: 650 mg via ORAL
  Filled 2021-12-19: qty 2

## 2021-12-19 MED ORDER — LIDOCAINE 2% (20 MG/ML) 5 ML SYRINGE
INTRAMUSCULAR | Status: DC | PRN
Start: 2021-12-19 — End: 2021-12-19
  Administered 2021-12-19: 60 mg via INTRAVENOUS

## 2021-12-19 MED ORDER — CLOBETASOL PROPIONATE 0.05 % EX CREA
1.0000 "application " | TOPICAL_CREAM | Freq: Every day | CUTANEOUS | Status: DC | PRN
  Filled 2021-12-19: qty 15

## 2021-12-19 MED ORDER — DENOSUMAB 60 MG/ML ~~LOC~~ SOLN: 60.0000 mg | SUBCUTANEOUS | Status: DC

## 2021-12-19 MED ORDER — FENTANYL CITRATE (PF) 100 MCG/2ML IJ SOLN
INTRAMUSCULAR | Status: DC | PRN
  Administered 2021-12-19 (×4): 25 ug via INTRAVENOUS

## 2021-12-19 MED ORDER — ONDANSETRON HCL 4 MG PO TABS: 4.0000 mg | ORAL_TABLET | Freq: Four times a day (QID) | ORAL | Status: DC | PRN

## 2021-12-19 MED ORDER — MENTHOL 3 MG MT LOZG: 1.0000 | LOZENGE | OROMUCOSAL | Status: DC | PRN

## 2021-12-19 MED ORDER — PHENOL 1.4 % MT LIQD: 1.0000 | OROMUCOSAL | Status: DC | PRN

## 2021-12-19 MED ORDER — PROPOFOL 10 MG/ML IV BOLUS
INTRAVENOUS | Status: DC | PRN
  Administered 2021-12-19: 140 mg via INTRAVENOUS

## 2021-12-19 MED ORDER — TRANEXAMIC ACID-NACL 1000-0.7 MG/100ML-% IV SOLN
INTRAVENOUS | Status: AC
  Filled 2021-12-19: qty 100

## 2021-12-19 MED ORDER — FENTANYL CITRATE (PF) 100 MCG/2ML IJ SOLN
INTRAMUSCULAR | Status: AC
Start: 2021-12-19 — End: ?
  Filled 2021-12-19: qty 2

## 2021-12-19 MED ORDER — METOCLOPRAMIDE HCL 10 MG PO TABS: 5.0000 mg | ORAL_TABLET | Freq: Three times a day (TID) | ORAL | Status: DC | PRN

## 2021-12-19 SURGICAL SUPPLY — 60 items
BENZOIN TINCTURE PRP APPL 2/3 (GAUZE/BANDAGES/DRESSINGS) ×1 IMPLANT
BIT DRILL 4.0X280 (BIT) ×1 IMPLANT
BLADE SURG SZ10 CARB STEEL (BLADE) ×4 IMPLANT
BNDG GAUZE ELAST 4 BULKY (GAUZE/BANDAGES/DRESSINGS) ×3 IMPLANT
CHLORAPREP W/TINT 26 (MISCELLANEOUS) ×2 IMPLANT
CLOTH BEACON ORANGE TIMEOUT ST (SAFETY) ×2 IMPLANT
CLSR STERI-STRIP ANTIMIC 1/2X4 (GAUZE/BANDAGES/DRESSINGS) ×1 IMPLANT
COVER LIGHT HANDLE STERIS (MISCELLANEOUS) ×4 IMPLANT
COVER MAYO STAND XLG (MISCELLANEOUS) ×2 IMPLANT
COVER PERINEAL POST (MISCELLANEOUS) ×2 IMPLANT
DRAPE STERI IOBAN 125X83 (DRAPES) ×2 IMPLANT
DRSG MEPILEX BORDER 4X12 (GAUZE/BANDAGES/DRESSINGS) ×2 IMPLANT
DRSG MEPILEX SACRM 8.7X9.8 (GAUZE/BANDAGES/DRESSINGS) ×2 IMPLANT
ELECT REM PT RETURN 9FT ADLT (ELECTROSURGICAL) ×2
ELECTRODE REM PT RTRN 9FT ADLT (ELECTROSURGICAL) ×1 IMPLANT
GLOVE BIO SURGEON STRL SZ 6.5 (GLOVE) ×1 IMPLANT
GLOVE BIOGEL PI IND STRL 7.0 (GLOVE) ×2 IMPLANT
GLOVE BIOGEL PI INDICATOR 7.0 (GLOVE) ×3
GLOVE SKINSENSE NS SZ8.0 LF (GLOVE) ×2
GLOVE SKINSENSE STRL SZ8.0 LF (GLOVE) IMPLANT
GLOVE SS N UNI LF 8.5 STRL (GLOVE) ×2 IMPLANT
GLOVE SURG SS PI 7.0 STRL IVOR (GLOVE) ×1 IMPLANT
GOWN STRL REUS W/TWL LRG LVL3 (GOWN DISPOSABLE) ×3 IMPLANT
GOWN STRL REUS W/TWL XL LVL3 (GOWN DISPOSABLE) ×2 IMPLANT
GUIDEWIRE BALL NOSE 2.0X800 (WIRE) ×1 IMPLANT
GUIDEWIRE BALL NOSE 3.0X900 (WIRE) ×2
GUIDEWIRE ORTH 900X3XBALL NOSE (WIRE) IMPLANT
INST SET MAJOR BONE (KITS) ×2 IMPLANT
KIT BLADEGUARD II DBL (SET/KITS/TRAYS/PACK) ×2 IMPLANT
KIT TURNOVER CYSTO (KITS) ×2 IMPLANT
MANIFOLD NEPTUNE II (INSTRUMENTS) ×2 IMPLANT
MARKER SKIN DUAL TIP RULER LAB (MISCELLANEOUS) ×2 IMPLANT
NAIL IT ES 125DX10X36 LT (Nail) ×1 IMPLANT
NDL HYPO 21X1.5 SAFETY (NEEDLE) ×1 IMPLANT
NDL SPNL 18GX3.5 QUINCKE PK (NEEDLE) ×1 IMPLANT
NEEDLE HYPO 21X1.5 SAFETY (NEEDLE) ×2 IMPLANT
NEEDLE SPNL 18GX3.5 QUINCKE PK (NEEDLE) ×2 IMPLANT
NS IRRIG 1000ML POUR BTL (IV SOLUTION) ×2 IMPLANT
PACK BASIC III (CUSTOM PROCEDURE TRAY) ×2
PACK SRG BSC III STRL LF ECLPS (CUSTOM PROCEDURE TRAY) ×1 IMPLANT
PAD ABD 5X9 TENDERSORB (GAUZE/BANDAGES/DRESSINGS) ×2 IMPLANT
PAD ARMBOARD 7.5X6 YLW CONV (MISCELLANEOUS) ×2 IMPLANT
PENCIL SMOKE EVACUATOR COATED (MISCELLANEOUS) ×2 IMPLANT
PIN GUIDE THRD AR 3.2X330 (PIN) ×1 IMPLANT
SCREW LOCK CORT 5.0X40 (Screw) ×1 IMPLANT
SCREW LOCK LAG FEM 10.5X90 (Screw) ×1 IMPLANT
SET BASIN LINEN APH (SET/KITS/TRAYS/PACK) ×2 IMPLANT
SPONGE T-LAP 18X18 ~~LOC~~+RFID (SPONGE) ×4 IMPLANT
STAPLER VISISTAT 35W (STAPLE) ×1 IMPLANT
SUT BRALON NAB BRD #1 30IN (SUTURE) ×2 IMPLANT
SUT MNCRL 0 VIOLET CTX 36 (SUTURE) ×1 IMPLANT
SUT MON AB 2-0 CT1 36 (SUTURE) ×2 IMPLANT
SUT MONOCRYL 0 CTX 36 (SUTURE) ×2
SYR 20ML LL LF (SYRINGE) ×1 IMPLANT
SYR 30ML LL (SYRINGE) ×2 IMPLANT
SYR BULB IRRIG 60ML STRL (SYRINGE) ×4 IMPLANT
TOOL ACTIVATION (INSTRUMENTS) ×1 IMPLANT
TRAY FOLEY MTR SLVR 16FR STAT (SET/KITS/TRAYS/PACK) ×2 IMPLANT
WATER STERILE IRR 1000ML POUR (IV SOLUTION) ×1 IMPLANT
YANKAUER SUCT BULB TIP NO VENT (SUCTIONS) ×2 IMPLANT

## 2021-12-19 NOTE — Progress Notes (Addendum)
?  Progress Note ? ?Patient: Monique Day [id number]:[id number] DOB: 07/09/1936  ?DOA: 12/17/2021  DOS: 12/19/2021  ?  ?Brief hospital course: ?Monique Day is an 86 y.o. 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 XR confirmed proximal left femur fracture for which orthopedics placed IM nail on 5/4 after eliquis washout.  ? ?Assessment and Plan: ?Closed intertrochanteric left hip fracture: s/p IM nail 12/19/2021 by Dr. Aline Brochure.  ?- Vitamin D level is 31.continue regular supplement. ?- Continue pain control as ordered.  ?- PT/OT after surgery. WBAT ?- First postop x-ray 4 weeks then 8 weeks and then 12 weeks.  ? ?Recurrent DVTs: Has not had an acute DVT in years. Has IVC filter placed in setting of hemorrhagic colitis related to UC.  ?- Restart eliquis 5/5 per orthopedic surgery. No bridging anticoagulation since no active DVT. ?- Place SCDs ? ?UC:  ?- Will hold humira while trying to heal surgical wound. Plan to restart after discharge and follow up with Dr. Laural Golden.  ? ?Stage IIIa CKD: SCr near baseline at 1.09.  ?- Avoid nephrotoxins. ? ?Obesity: Body mass index is 31.05 kg/m?.  ? ?Subjective: Pain well controlled post op, ready to start PT but will be tomorrow. No dyspnea or chest pain ? ?Objective: ?BP 127/62 (BP Location: Right Arm)   Pulse 94   Temp 98.8 ?F (37.1 ?C) (Oral)   Resp 18   Ht 5' 2"  (1.575 m)   Wt 77 kg   SpO2 99%   BMI 31.05 kg/m?   ?No distress ?Clear, nonlabored ?RRR without MRG. Trace LE edema.  ?LLE with normal alignment, left lateral thigh with longitudinal incisional dressing/mepilex c/d/I. Distally NVI. ? ?Data Personally reviewed:   ?CXR 1v: Chronic appearing increased interstitial lung markings without acute or active cardiopulmonary disease.  ? ?L Hip XR: Acute fracture of the proximal left femur.   ? ?Family  Communication: None at bedside ? ?Disposition: ?Status is: Inpatient ?Remains inpatient appropriate because: Requires operative management of femur fracture and postoperative PT/OT evaluation ?Planned Discharge Destination:  TBD ? ?Patrecia Pour, MD ?12/19/2021 4:26 PM ?Page by [email or web address]  ?

## 2021-12-19 NOTE — Interval H&P Note (Signed)
History and Physical Interval Note: ? ?12/19/2021 ?7:24 AM ? ?Monique Day  has presented today for surgery, with the diagnosis of left hip intertrochanteric fracture.  The various methods of treatment have been discussed with the patient and family. After consideration of risks, benefits and other options for treatment, the patient has consented to  Procedure(s): ?INTRAMEDULLARY (IM) NAIL INTERTROCHANTRIC (Left) as a surgical intervention.  The patient's history has been reviewed, patient examined, no change in status, stable for surgery.  I have reviewed the patient's chart and labs.  Questions were answered to the patient's satisfaction.   ? ? ?Arther Abbott ? ? ?

## 2021-12-19 NOTE — Plan of Care (Signed)
?  Problem: Education: ?Goal: Knowledge of General Education information will improve ?Description: Including pain rating scale, medication(s)/side effects and non-pharmacologic comfort measures ?12/19/2021 1940 by Santa Lighter, RN ?Outcome: Progressing ?12/19/2021 1608 by Santa Lighter, RN ?Outcome: Progressing ?  ?Problem: Health Behavior/Discharge Planning: ?Goal: Ability to manage health-related needs will improve ?Outcome: Progressing ?  ?Problem: Clinical Measurements: ?Goal: Ability to maintain clinical measurements within normal limits will improve ?12/19/2021 1940 by Santa Lighter, RN ?Outcome: Progressing ?12/19/2021 1608 by Santa Lighter, RN ?Outcome: Progressing ?Goal: Will remain free from infection ?Outcome: Progressing ?  ?Problem: Activity: ?Goal: Risk for activity intolerance will decrease ?Outcome: Progressing ?  ?Problem: Nutrition: ?Goal: Adequate nutrition will be maintained ?Outcome: Progressing ?  ?

## 2021-12-19 NOTE — Plan of Care (Signed)

## 2021-12-19 NOTE — Brief Op Note (Signed)
12/17/2021 - 12/19/2021 ? ?9:24 AM ? ?PATIENT:  Monique Day  86 y.o. female ? ?PRE-OPERATIVE DIAGNOSIS:  left hip intertrochanteric fracture ? ?POST-OPERATIVE DIAGNOSIS:  left hip intertrochanteric fracture ? ?PROCEDURE:  Procedure(s): ?INTRAMEDULLARY (IM) NAIL INTERTROCHANTRIC (Left) ? ?Arthrex intramedullary nail. ? ?360 mm long ? ?90 mm lag screw ? ?40 mm distal locking screw. ? ?Operative findings three-part inotrope fracture with stable reduction ? ? ? ?SURGEON:  Surgeon(s) and Role: ?   Carole Civil, MD - Primary ? ?PHYSICIAN ASSISTANT:  ? ?ASSISTANTS: Nikki Cox ? ?ANESTHESIA:   general ? ?EBL:  100 mL  ? ?BLOOD ADMINISTERED:none ? ?DRAINS: none  ? ?LOCAL MEDICATIONS USED:  OTHER exparel 20 cc ? ?SPECIMEN:  No Specimen ? ?DISPOSITION OF SPECIMEN:  N/A ? ?COUNTS:  YES ? ?TOURNIQUET:  * No tourniquets in log * ? ?DICTATION: .Dragon Dictation ? ?PLAN OF CARE: Admit to inpatient  ? ?PATIENT DISPOSITION:  PACU - hemodynamically stable. ?  ?Delay start of Pharmacological VTE agent (>24hrs) due to surgical blood loss or risk of bleeding: not applicable ? ?Details of procedure.  This is a 86 year old female who had a mechanical fall fractured her left hip.  She had to wait 48 hours for surgery secondary to being on Eliquis ? ?After she waited the appropriate time she was brought to surgery.  In the preop area of the left leg was confirmed as the surgical site and marked chart review and x-ray review completed implants reviewed also performed and implants available. ? ?She was taken to the operating room for general anesthesia.  After successful general anesthesia a Foley catheter was inserted and she was placed on the fracture table.  A padded perineal post was placed the patient was placed in traction.  The right leg was placed in abduction and a traction device as well. ? ?The C-arm was brought in and the reduction was performed with traction and internal rotation.  Once a satisfactory stable reduction  was obtained the patient's left hip was prepped extending to prep to below the knee ? ?Timeout was executed ? ?The incision was made approximately 2 cm proximal to the tip of the trochanter.  Subcutaneous tissue and fascia was divided ? ?Curved awl was placed in the anterior half of the trochanter tip.  This was passed down the femoral canal and confirmed to be in good position with radiographs ? ?The guidewire was passed down to the knee and confirmed to be in good position.  The measurement was approximately 370 mm and a 360 mm nail was chosen. ? ?The proximal reamer was passed over the guidewire to the level of the lesser trochanter ? ?The nail would not pass through the isthmus of the femur and therefore we reamed starting with a 10, 11 and 11.5.  Once this was done the nail was passed over the guidewire easily. ? ?A second incision was made for the lag screw.  Subcutaneous tissue was divided.  An elevator was used to expose the bone the cannula was advanced down to bone and then a threaded tip guidewire was passed in the center center position into the femoral head.  This was confirmed with AP and lateral x-rays ? ?We measured 97 mm and shows a 90 mm lag screw.  The reamer was passed over the guidewire and then a 90 mm lag screw was placed.  AP lateral imaging confirmed placement.  The lag screw was activated and a small inner locking device was removed. ? ?  We then turned our attention to the distal locking position ? ?The cannula was advanced through the targeting guide down to bone through a small incision and then appropriate drill bit was used to drill across the nail and the far cortex.  The measurement was 40 mm.  We took a 40 mm screw and passed it across the nail locking the distal portion. ? ?The wounds were irrigated.  Exparel 20 cc was divided between the 3 incisions ? ?The proximal wound was closed with #1 Braylon 0 Monocryl and 2-0 Monocryl.  The 2 distal wounds were closed with 2-0  Monocryl. ?Benzoin and Steri-Strips were applied ? ?A sterile dressing was applied ? ?Postoperative plan ? ?Weightbearing status as tolerated ? ?First postop x-ray 4 weeks then 8 weeks and then 12 weeks. ? ?DVT prophylaxis for 30 days but the patient will be returned to Eliquis which should be sufficient. ? ?Eliquis can resume tomorrow ?

## 2021-12-19 NOTE — Anesthesia Preprocedure Evaluation (Signed)
Anesthesia Evaluation  ?Patient identified by MRN, date of birth, ID band ?Patient awake ? ? ? ?Reviewed: ?Allergy & Precautions, H&P , NPO status , Patient's Chart, lab work & pertinent test results, reviewed documented beta blocker date and time  ? ?Airway ?Mallampati: II ? ?TM Distance: >3 FB ?Neck ROM: full ? ? ? Dental ?no notable dental hx. ? ?  ?Pulmonary ?neg pulmonary ROS, former smoker,  ?  ?Pulmonary exam normal ?breath sounds clear to auscultation ? ? ? ? ? ? Cardiovascular ?Exercise Tolerance: Good ?negative cardio ROS ? ? ?Rhythm:regular Rate:Normal ? ? ?  ?Neuro/Psych ?Seizures -,  TIAnegative psych ROS  ? GI/Hepatic ?Neg liver ROS, PUD,   ?Endo/Other  ?negative endocrine ROS ? Renal/GU ?negative Renal ROS  ?negative genitourinary ?  ?Musculoskeletal ? ? Abdominal ?  ?Peds ? Hematology ? ?(+) Blood dyscrasia, anemia ,   ?Anesthesia Other Findings ? ? Reproductive/Obstetrics ?negative OB ROS ? ?  ? ? ? ? ? ? ? ? ? ? ? ? ? ?  ?  ? ? ? ? ? ? ? ? ?Anesthesia Physical ?Anesthesia Plan ? ?ASA: 3 ? ?Anesthesia Plan: General and General LMA  ? ?Post-op Pain Management:   ? ?Induction:  ? ?PONV Risk Score and Plan: Ondansetron ? ?Airway Management Planned:  ? ?Additional Equipment:  ? ?Intra-op Plan:  ? ?Post-operative Plan:  ? ?Informed Consent: I have reviewed the patients History and Physical, chart, labs and discussed the procedure including the risks, benefits and alternatives for the proposed anesthesia with the patient or authorized representative who has indicated his/her understanding and acceptance.  ? ? ? ?Dental Advisory Given ? ?Plan Discussed with: CRNA ? ?Anesthesia Plan Comments:   ? ? ? ? ? ? ?Anesthesia Quick Evaluation ? ?

## 2021-12-19 NOTE — Op Note (Signed)
12/19/2021 ? ?9:24 AM ? ?PATIENT:  Monique Day  86 y.o. female ? ?PRE-OPERATIVE DIAGNOSIS:  left hip intertrochanteric fracture ? ?POST-OPERATIVE DIAGNOSIS:  left hip intertrochanteric fracture ? ?PROCEDURE:  Procedure(s): ?INTRAMEDULLARY (IM) NAIL INTERTROCHANTRIC (Left) ? ?Arthrex intramedullary nail. ? ?360 mm long ? ?90 mm lag screw ? ?40 mm distal locking screw. ? ?Operative findings three-part inotrope fracture with stable reduction ? ? ? ?SURGEON:  Surgeon(s) and Role: ?   Carole Civil, MD - Primary ? ?PHYSICIAN ASSISTANT:  ? ?ASSISTANTS: Nikki Cox ? ?ANESTHESIA:   general ? ?EBL:  100 mL  ? ?BLOOD ADMINISTERED:none ? ?DRAINS: none  ? ?LOCAL MEDICATIONS USED:  OTHER exparel 20 cc ? ?SPECIMEN:  No Specimen ? ?DISPOSITION OF SPECIMEN:  N/A ? ?COUNTS:  YES ? ?TOURNIQUET:  * No tourniquets in log * ? ?DICTATION: .Dragon Dictation ? ?PLAN OF CARE: Admit to inpatient  ? ?PATIENT DISPOSITION:  PACU - hemodynamically stable. ?  ?Delay start of Pharmacological VTE agent (>24hrs) due to surgical blood loss or risk of bleeding: not applicable ? ?Details of procedure.  This is a 86 year old female who had a mechanical fall fractured her left hip.  She had to wait 48 hours for surgery secondary to being on Eliquis ? ?After she waited the appropriate time she was brought to surgery.  In the preop area of the left leg was confirmed as the surgical site and marked chart review and x-ray review completed implants reviewed also performed and implants available. ? ?She was taken to the operating room for general anesthesia.  After successful general anesthesia a Foley catheter was inserted and she was placed on the fracture table.  A padded perineal post was placed the patient was placed in traction.  The right leg was placed in abduction and a traction device as well. ? ?The C-arm was brought in and the reduction was performed with traction and internal rotation.  Once a satisfactory stable reduction was obtained  the patient's left hip was prepped extending to prep to below the knee ? ?Timeout was executed ? ?The incision was made approximately 2 cm proximal to the tip of the trochanter.  Subcutaneous tissue and fascia was divided ? ?Curved awl was placed in the anterior half of the trochanter tip.  This was passed down the femoral canal and confirmed to be in good position with radiographs ? ?The guidewire was passed down to the knee and confirmed to be in good position.  The measurement was approximately 370 mm and a 360 mm nail was chosen. ? ?The proximal reamer was passed over the guidewire to the level of the lesser trochanter ? ?The nail would not pass through the isthmus of the femur and therefore we reamed starting with a 10, 11 and 11.5.  Once this was done the nail was passed over the guidewire easily. ? ?A second incision was made for the lag screw.  Subcutaneous tissue was divided.  An elevator was used to expose the bone the cannula was advanced down to bone and then a threaded tip guidewire was passed in the center center position into the femoral head.  This was confirmed with AP and lateral x-rays ? ?We measured 97 mm and shows a 90 mm lag screw.  The reamer was passed over the guidewire and then a 90 mm lag screw was placed.  AP lateral imaging confirmed placement.  The lag screw was activated and a small inner locking device was removed. ? ?We then  turned our attention to the distal locking position ? ?The cannula was advanced through the targeting guide down to bone through a small incision and then appropriate drill bit was used to drill across the nail and the far cortex.  The measurement was 40 mm.  We took a 40 mm screw and passed it across the nail locking the distal portion. ? ?The wounds were irrigated.  Exparel 20 cc was divided between the 3 incisions ? ?The proximal wound was closed with #1 Braylon 0 Monocryl and 2-0 Monocryl.  The 2 distal wounds were closed with 2-0 Monocryl. ?Benzoin and  Steri-Strips were applied ? ?A sterile dressing was applied ? ?Postoperative plan ? ?Weightbearing status as tolerated ? ?First postop x-ray 4 weeks then 8 weeks and then 12 weeks. ? ?DVT prophylaxis for 30 days but the patient will be returned to Eliquis which should be sufficient. ? ?Eliquis can resume tomorrow ?

## 2021-12-19 NOTE — Anesthesia Postprocedure Evaluation (Signed)
Anesthesia Post Note ? ?Patient: Monique Day ? ?Procedure(s) Performed: INTRAMEDULLARY (IM) NAIL INTERTROCHANTRIC (Left: Hip) ? ?Patient location during evaluation: Phase II ?Anesthesia Type: General ?Level of consciousness: awake ?Pain management: pain level controlled ?Vital Signs Assessment: post-procedure vital signs reviewed and stable ?Respiratory status: spontaneous breathing and respiratory function stable ?Cardiovascular status: blood pressure returned to baseline and stable ?Postop Assessment: no headache and no apparent nausea or vomiting ?Anesthetic complications: no ?Comments: Late entry ? ? ?No notable events documented. ? ? ?Last Vitals:  ?Vitals:  ? 12/19/21 1000 12/19/21 1023  ?BP: 127/62 126/85  ?Pulse: 84 89  ?Resp:  18  ?Temp:    ?SpO2: 99% 100%  ?  ?Last Pain:  ?Vitals:  ? 12/19/21 1013  ?TempSrc:   ?PainSc: 3   ? ? ?  ?  ?  ?  ?  ?  ? ?Louann Sjogren ? ? ? ? ?

## 2021-12-19 NOTE — Anesthesia Procedure Notes (Signed)
Procedure Name: LMA Insertion ?Date/Time: 12/19/2021 7:36 AM ?Performed by: Myna Bright, CRNA ?Pre-anesthesia Checklist: Emergency Drugs available, Patient identified, Suction available and Patient being monitored ?Patient Re-evaluated:Patient Re-evaluated prior to induction ?Oxygen Delivery Method: Circle system utilized ?Preoxygenation: Pre-oxygenation with 100% oxygen ?Induction Type: IV induction ?Ventilation: Mask ventilation without difficulty ?LMA: LMA inserted ?LMA Size: 4.0 ?Number of attempts: 1 ?Placement Confirmation: positive ETCO2 and breath sounds checked- equal and bilateral ?Tube secured with: Tape ?Dental Injury: Teeth and Oropharynx as per pre-operative assessment  ? ? ? ? ?

## 2021-12-19 NOTE — Transfer of Care (Signed)
Immediate Anesthesia Transfer of Care Note ? ?Patient: Monique Day ? ?Procedure(s) Performed: INTRAMEDULLARY (IM) NAIL INTERTROCHANTRIC (Left: Hip) ? ?Patient Location: PACU ? ?Anesthesia Type:General ? ?Level of Consciousness: awake, alert , oriented and patient cooperative ? ?Airway & Oxygen Therapy: Patient Spontanous Breathing and Patient connected to nasal cannula oxygen ? ?Post-op Assessment: Report given to RN, Post -op Vital signs reviewed and stable and Patient moving all extremities ? ?Post vital signs: Reviewed and stable ? ?Last Vitals:  ?Vitals Value Taken Time  ?BP 141/63 12/19/21 0920  ?Temp    ?Pulse 85 12/19/21 0924  ?Resp 11 12/19/21 0924  ?SpO2 100 % 12/19/21 0924  ?Vitals shown include unvalidated device data. ? ?Last Pain:  ?Vitals:  ? 12/19/21 0642  ?TempSrc: Oral  ?PainSc: 0-No pain  ?   ? ?Patients Stated Pain Goal: 6 (12/19/21 7096) ? ?Complications: No notable events documented. ?

## 2021-12-19 NOTE — Brief Op Note (Signed)
12/17/2021 - 12/19/2021 ? ?9:17 AM ? ?PATIENT:  Monique Day  86 y.o. female ? ?PRE-OPERATIVE DIAGNOSIS:  left hip intertrochanteric fracture ? ?POST-OPERATIVE DIAGNOSIS:  left hip intertrochanteric fracture ? ?PROCEDURE:  Procedure(s): ?INTRAMEDULLARY (IM) NAIL INTERTROCHANTRIC (Left) ? ?Arthrex intramedullary nail. ? ?360 mm long ? ?90 mm lag screw ? ?40 mm distal locking screw. ? ?Operative findings three-part inotrope fracture with stable reduction ? ? ? ?SURGEON:  Surgeon(s) and Role: ?   Carole Civil, MD - Primary ? ?PHYSICIAN ASSISTANT:  ? ?ASSISTANTS: Nikki Cox ? ?ANESTHESIA:   general ? ?EBL:  100 mL  ? ?BLOOD ADMINISTERED:none ? ?DRAINS: none  ? ?LOCAL MEDICATIONS USED:  OTHER exparel 20 cc ? ?SPECIMEN:  No Specimen ? ?DISPOSITION OF SPECIMEN:  N/A ? ?COUNTS:  YES ? ?TOURNIQUET:  * No tourniquets in log * ? ?DICTATION: .Dragon Dictation ? ?PLAN OF CARE: Admit to inpatient  ? ?PATIENT DISPOSITION:  PACU - hemodynamically stable. ?  ?Delay start of Pharmacological VTE agent (>24hrs) due to surgical blood loss or risk of bleeding: not applicable ? ?Details of procedure.  This is a 86 year old female who had a mechanical fall fractured her left hip.  She had to wait 48 hours for surgery secondary to being on Eliquis ? ?After she waited the appropriate time she was brought to surgery.  In the preop area of the left leg was confirmed as the surgical site and marked chart review and x-ray review completed implants reviewed also performed and implants available. ? ?She was taken to the operating room for general anesthesia.  After successful general anesthesia a Foley catheter was inserted and she was placed on the fracture table.  A padded perineal post was placed the patient was placed in traction.  The right leg was placed in abduction and a traction device as well. ? ?The C-arm was brought in and the reduction was performed with traction and internal rotation.  Once a satisfactory stable reduction  was obtained the patient's left hip was prepped extending to prep to below the knee ? ?Timeout was executed ? ?The incision was made approximately 2 cm proximal to the tip of the trochanter.  Subcutaneous tissue and fascia was divided ? ?Curved awl was placed in the anterior half of the trochanter tip.  This was passed down the femoral canal and confirmed to be in good position with radiographs ? ?The guidewire was passed down to the knee and confirmed to be in good position.  The measurement was approximately 370 mm and a 360 mm nail was chosen. ? ?The proximal reamer was passed over the guidewire to the level of the lesser trochanter ? ?The nail would not pass through the isthmus of the femur and therefore we reamed starting with a 10, 11 and 11.5.  Once this was done the nail was passed over the guidewire easily. ? ?A second incision was made for the lag screw.  Subcutaneous tissue was divided.  An elevator was used to expose the bone the cannula was advanced down to bone and then a threaded tip guidewire was passed in the center center position into the femoral head.  This was confirmed with AP and lateral x-rays ? ?We measured 97 mm and shows a 90 mm lag screw.  The reamer was passed over the guidewire and then a 90 mm lag screw was placed.  AP lateral imaging confirmed placement.  The lag screw was activated and a small inner locking device was removed. ? ?  We then turned our attention to the distal locking position ? ?The cannula was advanced through the targeting guide down to bone through a small incision and then appropriate drill bit was used to drill across the nail and the far cortex.  The measurement was 40 mm.  We took a 40 mm screw and passed it across the nail locking the distal portion. ? ?The wounds were irrigated.  Exparel 20 cc was divided between the 3 incisions ? ?The proximal wound was closed with #1 Braylon 0 Monocryl and 2-0 Monocryl.  The 2 distal wounds were closed with 2-0  Monocryl. ?Benzoin and Steri-Strips were applied ? ?A sterile dressing was applied ? ?Postoperative plan ? ?Weightbearing status as tolerated ? ?First postop x-ray 4 weeks then 8 weeks and then 12 weeks. ? ?DVT prophylaxis for 30 days but the patient will be returned to Eliquis which should be sufficient. ? ?Eliquis can resume tomorrow ?

## 2021-12-20 DIAGNOSIS — D62 Acute posthemorrhagic anemia: Secondary | ICD-10-CM

## 2021-12-20 LAB — CBC
HCT: 34.3 % — ABNORMAL LOW (ref 36.0–46.0)
Hemoglobin: 11.4 g/dL — ABNORMAL LOW (ref 12.0–15.0)
MCH: 32.3 pg (ref 26.0–34.0)
MCHC: 33.2 g/dL (ref 30.0–36.0)
MCV: 97.2 fL (ref 80.0–100.0)
Platelets: 163 10*3/uL (ref 150–400)
RBC: 3.53 MIL/uL — ABNORMAL LOW (ref 3.87–5.11)
RDW: 13 % (ref 11.5–15.5)
WBC: 11.4 10*3/uL — ABNORMAL HIGH (ref 4.0–10.5)
nRBC: 0 % (ref 0.0–0.2)

## 2021-12-20 MED ORDER — APIXABAN 2.5 MG PO TABS
2.5000 mg | ORAL_TABLET | Freq: Two times a day (BID) | ORAL | Status: DC
Start: 2021-12-20 — End: 2021-12-20

## 2021-12-20 MED ORDER — APIXABAN 2.5 MG PO TABS
2.5000 mg | ORAL_TABLET | Freq: Two times a day (BID) | ORAL | Status: DC
Start: 2021-12-20 — End: 2021-12-21
  Administered 2021-12-20 – 2021-12-21 (×2): 2.5 mg via ORAL
  Filled 2021-12-20 (×2): qty 1

## 2021-12-20 MED ORDER — METHOCARBAMOL 500 MG PO TABS: 500.0000 mg | ORAL_TABLET | Freq: Four times a day (QID) | ORAL | Status: DC | PRN

## 2021-12-20 MED ORDER — DOCUSATE SODIUM 100 MG PO CAPS: 100.0000 mg | ORAL_CAPSULE | Freq: Two times a day (BID) | ORAL | 0 refills | Status: DC

## 2021-12-20 MED ORDER — HYDROCODONE-ACETAMINOPHEN 5-325 MG PO TABS: 1.0000 | ORAL_TABLET | ORAL | 0 refills | Status: DC | PRN

## 2021-12-20 NOTE — Plan of Care (Signed)
?  Problem: Acute Rehab PT Goals(only PT should resolve) ?Goal: Pt Will Go Supine/Side To Sit ?Outcome: Progressing ?Flowsheets (Taken 12/20/2021 1016) ?Pt will go Supine/Side to Sit: with min guard assist ?Goal: Pt Will Go Sit To Supine/Side ?Outcome: Progressing ?Flowsheets (Taken 12/20/2021 1016) ?Pt will go Sit to Supine/Side: with min guard assist ?Goal: Patient Will Transfer Sit To/From Stand ?Outcome: Progressing ?Flowsheets (Taken 12/20/2021 1016) ?Patient will transfer sit to/from stand: ? with min guard assist ? with minimal assist ?Goal: Pt Will Transfer Bed To Chair/Chair To Bed ?Outcome: Progressing ?Flowsheets (Taken 12/20/2021 1016) ?Pt will Transfer Bed to Chair/Chair to Bed: with min assist ?Goal: Pt Will Ambulate ?Outcome: Progressing ?Flowsheets (Taken 12/20/2021 1016) ?Pt will Ambulate: ? 25 feet ? with minimal assist ? with moderate assist ? with rolling walker ?Goal: Pt/caregiver will Perform Home Exercise Program ?Outcome: Progressing ?Flowsheets (Taken 12/20/2021 1016) ?Pt/caregiver will Perform Home Exercise Program: ? For increased strengthening ? For improved balance ? Independently ? 10:17 AM, 12/20/21 ?Mearl Latin PT, DPT ?Physical Therapist at [facility] ?[facility] ? ?

## 2021-12-20 NOTE — TOC Initial Note (Signed)
Transition of Care (TOC) - Initial/Assessment Note  ? ? ?Patient Details  ?Name: Monique Day ?MRN: [id number] ?Date of Birth: [birth year] ? ?Transition of Care (TOC) CM/SW Contact:    ?Shade Flood, LCSW ?Phone Number: ?12/20/2021, 11:49 AM ? ?Clinical Narrative:                 ? ?PT recommending SNF rehab. Spoke with pt today to review dc planning. Pt states she lives alone and she is agreeable to SNF referrals. CMS provider options reviewed. Will refer as requested. Pt will need insurance auth. TOC will follow. ? ?Expected Discharge Plan: [place] ?Barriers to Discharge: Continued Medical Work up ? ? ?Patient Goals and CMS Choice ?Patient states their goals for this hospitalization and ongoing recovery are:: go to rehab ?CMS Medicare.gov Compare Post Acute Care list provided to:: Patient ?Choice offered to / list presented to : Patient ? ?Expected Discharge Plan and Services ?Expected Discharge Plan: [place] ?In-house Referral: Clinical Social Work ?  ?Post Acute Care Choice: [place] ?Living arrangements for the past 2 months: [place] ?                ?  ?  ?  ?  ?  ?  ?  ?  ?  ?  ? ?Prior Living Arrangements/Services ?Living arrangements for the past 2 months: [place] ?Lives with:: Self ?Patient language and need for interpreter reviewed:: Yes ?Do you feel safe going back to the place where you live?: Yes      ?Need for Family Participation in Patient Care: No (Comment) ?  ?  ?Criminal Activity/Legal Involvement Pertinent to Current Situation/Hospitalization: No - Comment as needed ? ?Activities of Daily Living ?Home Assistive Devices/Equipment: Shower chair with back ?ADL Screening (condition at time of admission) ?Patient's cognitive ability adequate to safely complete daily activities?: Yes ?Is the patient deaf or have difficulty hearing?: No ?Does the patient have difficulty seeing, even when wearing glasses/contacts?: No ?Does the  patient have difficulty concentrating, remembering, or making decisions?: No ?Patient able to express need for assistance with ADLs?: Yes ?Does the patient have difficulty dressing or bathing?: No ?Independently performs ADLs?: Yes (appropriate for developmental age) ?Does the patient have difficulty walking or climbing stairs?: No ?Weakness of Legs: None ?Weakness of Arms/Hands: None ? ?Permission Sought/Granted ?Permission sought to share information with : Customer service manager ?Permission granted to share information with : Yes, Verbal Permission Granted ?   ? Permission granted to share info w AGENCY: snf ?   ?   ? ?Emotional Assessment ?  ?Attitude/Demeanor/Rapport: Engaged ?Affect (typically observed): Pleasant ?Orientation: : Oriented to Self, Oriented to Place, Oriented to  Time, Oriented to Situation ?Alcohol / Substance Use: Not Applicable ?Psych Involvement: No (comment) ? ?Admission diagnosis:  Closed left hip fracture ([place]) [S72.002A] ?Patient Active Problem List  ? Diagnosis Date Noted  ? Displaced intertrochanteric fracture of left femur, initial encounter for closed fracture ([place]) 12/17/2021  ? Constipation 04/02/2021  ? Rectal bleeding 04/05/2019  ? Ulcerative pancolitis without complication ([place]) 16/38/4536  ? Breast cancer, stage 1, left ([place]) 08/19/2018  ? Breast cancer of upper-inner quadrant of left female breast ([place]) 07/21/2018  ? Recurrent deep vein thrombosis (DVT) of right lower extremity ([place]) 03/08/2018  ? RBBB 03/08/2018  ? Hx of colonic polyps 03/14/2016  ? S/P IVC filter   ? Leukocytosis 07/12/2012  ? Hypotension 07/12/2012  ? Hyperglycemia 07/12/2012  ? Anemia 06/28/2012  ?  Anemia due to blood loss, acute 06/09/2012  ? Hematochezia 06/09/2012  ? Warfarin-induced coagulopathy ([place]) 06/09/2012  ? Hyponatremia 06/07/2012  ? Dehydration 06/07/2012  ? UC (ulcerative colitis confined to rectum) ([place]) 11/05/2011  ? DVT (deep venous thrombosis) ([place]) 11/05/2011  ? ?PCP:  [name], NP ?Pharmacy:   ?[facility] ?[address] ?[place] [address] ?Phone: [phone] Fax: [phone] ? ? ? ? ?Social Determinants of Health (SDOH) Interventions ?  ? ?Readmission Risk Interventions ?   ? View : No data to display.  ?  ?  ?  ? ? ? ?

## 2021-12-20 NOTE — Progress Notes (Signed)
Patient ID: Monique Day, female   DOB: 11/11/1935, 86 y.o.   MRN: [id number] ? ?Postop day 1 status post intramedullary nailing left hip for intertrochanteric fracture ? ?Patient is doing well she should be full weightbearing as tolerated.  She can resume any anticoagulates which will serve as our DVT prophylaxis as well ? ?The patient has observable sutures so she will not need appointment until 4 weeks from now for x-ray ? ?No hip precautions ? ? ?

## 2021-12-20 NOTE — Evaluation (Signed)
Physical Therapy Evaluation ?Patient Details ?Name: Monique Day ?MRN: [id number] ?DOB: Jul 21, 1936 ?Today's Date: 12/20/2021 ? ?History of Present Illness ? Monique Day is a 86 y.o. female with medical history significant for ulcerative colitis, DVT on chronic anticoagulation with Eliquis, stroke.  Patient presented to the ED after a fall with subsequent complaints of right hip pain.  Patient reports she was coming out of a restaurant, she stepped off a curb and was by her car, getting her key out when she felt like her left leg gave out and she fell on her left hip.  She did not hit her head, she did not lose consciousness.  Fall was witnessed by many people.  She denies frequent falls.  She denies headache, no chest pain or difficulty breathing, no dizziness, no focal weakness of extremities, she has maintained good oral intake, she was basically in her normal state of health. ? ?  ?Clinical Impression ? Patient limited for functional mobility as stated below secondary to BLE weakness, fatigue and poor standing balance. Patient requires assist with LLE mobility to transition to seated EOB secondary to weakness. She demonstrates good sitting tolerance and sitting balance EOB. Patient able to transfer to standing with use of RW, cueing for sequencing, and assist for weakness. She demonstrates improving standing balance with time standing. Patient initially completing standing weight shifting at bed and marching for increasing LLE weightbearing. She is able to complete a few small, lateral steps at bedside to chair with assist and RW. Patient will benefit from continued physical therapy in hospital and recommended venue below to increase strength, balance, endurance for safe ADLs and gait. ?   ?   ? ?Recommendations for follow up therapy are one component of a multi-disciplinary discharge planning process, led by the attending physician.  Recommendations may be updated based on patient status, additional  functional criteria and insurance authorization. ? ?Follow Up Recommendations Skilled nursing-short term rehab (<3 hours/day) ? ?  ?Assistance Recommended at Discharge Intermittent Supervision/Assistance  ?Patient can return home with the following ? A lot of help with walking and/or transfers;A lot of help with bathing/dressing/bathroom;Assist for transportation;Help with stairs or ramp for entrance;Assistance with cooking/housework ? ?  ?Equipment Recommendations None recommended by PT  ?Recommendations for Other Services ?    ?  ?Functional Status Assessment Patient has had a recent decline in their functional status and demonstrates the ability to make significant improvements in function in a reasonable and predictable amount of time.  ? ?  ?Precautions / Restrictions Precautions ?Precautions: Fall ?Restrictions ?Weight Bearing Restrictions: Yes ?LLE Weight Bearing: Weight bearing as tolerated  ? ?  ? ?Mobility ? Bed Mobility ?Overal bed mobility: Needs Assistance ?Bed Mobility: Supine to Sit ?  ?  ?Supine to sit: Min assist, HOB elevated ?  ?  ?General bed mobility comments: assist for LLE movement to transition to seated EOB ?  ? ?Transfers ?Overall transfer level: Needs assistance ?Equipment used: Rolling walker (2 wheels) ?Transfers: Sit to/from Stand, Bed to chair/wheelchair/BSC ?Sit to Stand: Min assist, Mod assist ?  ?Step pivot transfers: Min assist ?  ?  ?  ?General transfer comment: slow, labored, cueing for sequencing and UE use ?  ? ?Ambulation/Gait ?Ambulation/Gait assistance: Min assist ?Gait Distance (Feet): 3 Feet ?Assistive device: Rolling walker (2 wheels) ?Gait Pattern/deviations: Shuffle ?Gait velocity: decreased ?  ?  ?General Gait Details: labored stepping at bedside to ambulate to chair with very small steps ? ?Stairs ?  ?  ?  ?  ?  ? ?  Wheelchair Mobility ?  ? ?Modified Rankin (Stroke Patients Only) ?  ? ?  ? ?Balance Overall balance assessment: Needs assistance ?Sitting-balance support:  No upper extremity supported, Feet supported ?Sitting balance-Leahy Scale: Good ?Sitting balance - Comments: seated EOB ?  ?Standing balance support: Bilateral upper extremity supported ?Standing balance-Leahy Scale: Fair ?Standing balance comment: with RW ?  ?  ?  ?  ?  ?  ?  ?  ?  ?  ?  ?   ? ? ? ?Pertinent Vitals/Pain Pain Assessment ?Pain Assessment: No/denies pain  ? ? ?Home Living Family/patient expects to be discharged to:: Private residence ?Living Arrangements: Alone ?  ?Type of Home: House ?Home Access: Stairs to enter ?Entrance Stairs-Rails: Left ?Entrance Stairs-Number of Steps: 3 ?  ?Home Layout: One level ?Home Equipment: Standard Walker;BSC/3in1;Shower seat;Grab bars - tub/shower ?   ?  ?Prior Function Prior Level of Function : Independent/Modified Independent;Driving ?  ?  ?  ?  ?  ?  ?Mobility Comments: community ambulation without AD ?ADLs Comments: independent ?  ? ? ?Hand Dominance  ?   ? ?  ?Extremity/Trunk Assessment  ? Upper Extremity Assessment ?Upper Extremity Assessment: Overall WFL for tasks assessed ?  ? ?Lower Extremity Assessment ?Lower Extremity Assessment: LLE deficits/detail ?LLE Deficits / Details: LLE weakness s/p ORIF ?  ? ?Cervical / Trunk Assessment ?Cervical / Trunk Assessment: Normal  ?Communication  ? Communication: No difficulties  ?Cognition Arousal/Alertness: Awake/alert ?Behavior During Therapy: [facility] for tasks assessed/performed ?Overall Cognitive Status: Within Functional Limits for tasks assessed ?  ?  ?  ?  ?  ?  ?  ?  ?  ?  ?  ?  ?  ?  ?  ?  ?  ?  ?  ? ?  ?General Comments   ? ?  ?Exercises Other Exercises ?Other Exercises: lateral weight shifting x 10 bilateral ?Other Exercises: standing march with bilateral UE support with RW x 10 bilateral  ? ?Assessment/Plan  ?  ?PT Assessment Patient needs continued PT services  ?PT Problem List Decreased strength;Decreased mobility;Decreased activity tolerance;Decreased range of motion;Decreased balance ? ?   ?  ?PT Treatment  Interventions DME instruction;Therapeutic exercise;Gait training;Balance training;Stair training;Neuromuscular re-education;Functional mobility training;Therapeutic activities;Patient/family education   ? ?PT Goals (Current goals can be found in the Care Plan section)  ?Acute Rehab PT Goals ?Patient Stated Goal: Return home after rehab ?PT Goal Formulation: With patient ?Time For Goal Achievement: 01/03/22 ?Potential to Achieve Goals: Good ? ?  ?Frequency Min 3X/week ?  ? ? ?Co-evaluation   ?  ?  ?  ?  ? ? ?  ?AM-PAC PT "6 Clicks" Mobility  ?Outcome Measure Help needed turning from your back to your side while in a flat bed without using bedrails?: None ?Help needed moving from lying on your back to sitting on the side of a flat bed without using bedrails?: A Little ?Help needed moving to and from a bed to a chair (including a wheelchair)?: A Lot ?Help needed standing up from a chair using your arms (e.g., wheelchair or bedside chair)?: A Lot ?Help needed to walk in hospital room?: A Lot ?Help needed climbing 3-5 steps with a railing? : A Lot ?6 Click Score: 15 ? ?  ?End of Session Equipment Utilized During Treatment: Gait belt ?Activity Tolerance: Patient tolerated treatment well;Patient limited by fatigue ?Patient left: in chair;with call bell/phone within reach ?Nurse Communication: Mobility status ?PT Visit Diagnosis: Unsteadiness on feet (R26.81);Other abnormalities of gait and  mobility (R26.89);History of falling (Z91.81);Muscle weakness (generalized) (M62.81) ?  ? ?Time: 0820-0850 ?PT Time Calculation (min) (ACUTE ONLY): 30 min ? ? ?Charges:   PT Evaluation ?$PT Eval Low Complexity: 1 Low ?PT Treatments ?$Therapeutic Exercise: 8-22 mins ?$Therapeutic Activity: 8-22 mins ?  ?   ? ? ?10:15 AM, 12/20/21 ?Mearl Latin PT, DPT ?Physical Therapist at [facility] ?[facility] ? ? ?

## 2021-12-20 NOTE — TOC Progression Note (Addendum)
Transition of Care (TOC) - Progression Note  ? ? ?Patient Details  ?Name: Monique Day ?MRN: [id number] ?Date of Birth: 19-Jul-1936 ? ?Transition of Care (TOC) CM/SW Contact  ?Iona Beard, LCSWA ?Phone Number: ?12/20/2021, 1:54 PM ? ?Clinical Narrative:    ?CSW spoke with pt about bed offers. Pt would [facility] to [facility] bed at [facility] for SNF. CSW spoke to admissions with the facility and they can accept pt tomorrow for admission if insurance auth comes through. Facility will need the DC summary faxed by 11am to [phone]. TOC to follow.,  ? ?Expected Discharge Plan: [place] ?Barriers to Discharge: Continued Medical Work up ? ?Expected Discharge Plan and Services ?Expected Discharge Plan: [place] ?In-house Referral: Clinical Social Work ?  ?Post Acute Care Choice: [place] ?Living arrangements for the past 2 months: [place] ?                ?  ?  ?  ?  ?  ?  ?  ?  ?  ?  ? ? ?Social Determinants of Health (SDOH) Interventions ?  ? ?Readmission Risk Interventions ?   ? View : No data to display.  ?  ?  ?  ? ? ?

## 2021-12-20 NOTE — [place] FL2 (Signed)
?[place] MEDICAID FL2 LEVEL OF CARE SCREENING TOOL  ?  ? ?IDENTIFICATION  ?Patient Name: ?Monique Day Birthdate: [birth year] Sex: female Admission Date (Current Location): ?12/17/2021  ?[place] and [place] Number: ? [place] and Address:  ?[facility] [address], [place] ?     Provider Number: ?[id number]  ?Attending Physician Name and Address:  ?Bonnielee Haff, MD ? Relative Name and Phone Number:  ?  ?   ?Current Level of Care: ?Hospital Recommended Level of Care: ?[place] Prior Approval Number: ?  ? ?Date Approved/Denied: ?  PASRR Number: ?[id number] A ? ?Discharge Plan: ?SNF ?  ? ?Current Diagnoses: ?Patient Active Problem List  ? Diagnosis Date Noted  ? Displaced intertrochanteric fracture of left femur, initial encounter for closed fracture ([place]) 12/17/2021  ? Constipation 04/02/2021  ? Rectal bleeding 04/05/2019  ? Ulcerative pancolitis without complication ([place]) 81/44/8185  ? Breast cancer, stage 1, left ([place]) 08/19/2018  ? Breast cancer of upper-inner quadrant of left female breast ([place]) 07/21/2018  ? Recurrent deep vein thrombosis (DVT) of right lower extremity ([place]) 03/08/2018  ? RBBB 03/08/2018  ? Hx of colonic polyps 03/14/2016  ? S/P IVC filter   ? Leukocytosis 07/12/2012  ? Hypotension 07/12/2012  ? Hyperglycemia 07/12/2012  ? Anemia 06/28/2012  ? Anemia due to blood loss, acute 06/09/2012  ? Hematochezia 06/09/2012  ? Warfarin-induced coagulopathy ([place]) 06/09/2012  ? Hyponatremia 06/07/2012  ? Dehydration 06/07/2012  ? UC (ulcerative colitis confined to rectum) ([place]) 11/05/2011  ? DVT (deep venous thrombosis) ([place]) 11/05/2011  ? ? ?Orientation RESPIRATION BLADDER Height & Weight   ?  ?Self, Time, Situation, Place ? Normal Continent Weight: 169 lb 12.1 oz (77 kg) ?Height:  5' 2"  (157.5 cm)  ?BEHAVIORAL SYMPTOMS/MOOD NEUROLOGICAL BOWEL NUTRITION STATUS  ?    Continent Diet (see dc summary)  ?AMBULATORY STATUS COMMUNICATION OF NEEDS Skin    ?Extensive Assist Verbally Surgical wounds ?  ?  ?  ?    ?     ?     ? ? ?Personal Care Assistance Level of Assistance  ?Bathing, Feeding, Dressing Bathing Assistance: Limited assistance ?Feeding assistance: Independent ?Dressing Assistance: Limited assistance ?   ? ?Functional Limitations Info  ?Sight, Hearing, Speech Sight Info: Adequate ?Hearing Info: Adequate ?Speech Info: Adequate  ? ? ?SPECIAL CARE FACTORS FREQUENCY  ?PT (By licensed PT), OT (By licensed OT)   ?  ?PT Frequency: 5x wek ?OT Frequency: 3x week ?  ?  ?  ?   ? ? ?Contractures Contractures Info: Not present  ? ? ?Additional Factors Info  ?Code Status, Allergies Code Status Info: Full ?Allergies Info: Mercaptopurine, Sulfa Antibiotics ?  ?  ?  ?   ? ?Current Medications (12/20/2021):  This is the current hospital active medication list ?Current Facility-Administered Medications  ?Medication Dose Route Frequency Provider Last Rate Last Admin  ? acetaminophen (TYLENOL) tablet 325-650 mg  325-650 mg Oral Q6H PRN Carole Civil, MD      ? apixaban Arne Cleveland) tablet 2.5 mg  2.5 mg Oral BID Bonnielee Haff, MD      ? calcium-vitamin D Darron Doom WITH D) 500-5 MG-MCG per tablet 1 tablet  1 tablet Oral Daily Carole Civil, MD   1 tablet at 12/20/21 [phone]  ? clobetasol cream (TEMOVATE) 9.70 % 1 application.  1 application. Topical Daily PRN Carole Civil, MD      ? dapagliflozin propanediol (FARXIGA) tablet 10 mg  10 mg Oral  Daily Carole Civil, MD   10 mg at 12/20/21 1610  ? denosumab (PROLIA) injection 60 mg  60 mg Subcutaneous Q6 months Carole Civil, MD      ? docusate sodium (COLACE) capsule 100 mg  100 mg Oral BID Carole Civil, MD   100 mg at 12/20/21 0857  ? HYDROcodone-acetaminophen (NORCO) 7.5-325 MG per tablet 1-2 tablet  1-2 tablet Oral Q4H PRN Carole Civil, MD      ? HYDROcodone-acetaminophen (NORCO/VICODIN) 5-325 MG per tablet 1-2 tablet  1-2 tablet Oral Q4H PRN Carole Civil, MD      ? magnesium oxide  (MAG-OX) tablet 200 mg  200 mg Oral Daily Carole Civil, MD   200 mg at 12/20/21 0857  ? menthol-cetylpyridinium (CEPACOL) lozenge 3 mg  1 lozenge Oral PRN Carole Civil, MD      ? Or  ? phenol (CHLORASEPTIC) mouth spray 1 spray  1 spray Mouth/Throat PRN Carole Civil, MD      ? methocarbamol (ROBAXIN) tablet 500 mg  500 mg Oral Q6H PRN Carole Civil, MD      ? Or  ? methocarbamol (ROBAXIN) 500 mg in dextrose 5 % 50 mL IVPB  500 mg Intravenous Q6H PRN Carole Civil, MD      ? metoCLOPramide (REGLAN) tablet 5-10 mg  5-10 mg Oral Q8H PRN Carole Civil, MD      ? Or  ? metoCLOPramide (REGLAN) injection 5-10 mg  5-10 mg Intravenous Q8H PRN Carole Civil, MD      ? morphine (PF) 2 MG/ML injection 0.5-1 mg  0.5-1 mg Intravenous Q2H PRN Carole Civil, MD      ? ondansetron [facility]) tablet 4 mg  4 mg Oral Q6H PRN Carole Civil, MD      ? Or  ? ondansetron [facility]) injection 4 mg  4 mg Intravenous Q6H PRN Carole Civil, MD      ? polyethylene glycol (MIRALAX / GLYCOLAX) packet 17 g  17 g Oral Daily PRN Carole Civil, MD      ? polyethylene glycol (MIRALAX / GLYCOLAX) packet 17 g  17 g Oral Daily Carole Civil, MD   17 g at 12/20/21 9604  ? traMADol (ULTRAM) tablet 50 mg  50 mg Oral Q6H Carole Civil, MD   50 mg at 12/20/21 5409  ? ?Facility-Administered Medications Ordered in Other Encounters  ?Medication Dose Route Frequency Provider Last Rate Last Admin  ? sodium chloride flush (NS) 0.9 % injection 10 mL  10 mL Intravenous PRN Derek Jack, MD   10 mL at 07/24/21 1429  ? ? ? ?Discharge Medications: ?Please see discharge summary for a list of discharge medications. ? ?Relevant Imaging Results: ? ?Relevant Lab Results: ? ? ?Additional Information ?SSN: [id number] ? ?Shade Flood, LCSW ? ? ? ? ?

## 2021-12-20 NOTE — Progress Notes (Signed)
? ?[facility] ?PROGRESS NOTE ? ? ?Monique Day SNK:[id number] DOB: [birth year] DOA: 12/17/2021 ? ?PCP: [name], NP ? ?Brief History/Interval Summary: 86 y.o. 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 XR confirmed proximal left femur fracture for which orthopedics placed IM nail on 5/4 after eliquis washout.  ? ?Consultants: Dr. Aline Brochure with orthopedics ? ?Procedures: Intramedullary nail left hip 5/4 ? ? ? ?Subjective/Interval History: ?Patient denies any pain at this time.  No shortness of breath.  No chest pain.  No nausea or vomiting. ? ? ? ? ?Assessment/Plan: ? ?Left hip fracture ?Status post IM Dr. Aline Brochure on 5/4.  Vitamin D level is 31.  Pain is adequately controlled.  PT evaluation has been done.  SNF is recommended.  Social worker is following. ?Postop care per orthopedics. ? ?History of recurrent DVTs ?No recent history of same.  Has IVC protoplaced in the setting of hemorrhagic colitis previously.  Eliquis was held at the time of admission.  Could be resumed today. ? ?Postoperative blood loss anemia ?Drop in hemoglobin from 13.6-11.4 noted.  Recheck tomorrow. ? ?History of ulcerative colitis ?Followed by Dr. Laural Golden.  Noted to be on Humira as an outpatient.  Currently on hold to facilitate wound healing. ? ?Chronic kidney disease stage IIIa ?Stable for the most part.  Monitor urine output. ? ?Obesity ?Estimated body mass index is 31.05 kg/m? as calculated from the following: ?  Height as of this encounter: 5' 2"  (1.575 m). ?  Weight as of this encounter: 77 kg. ? ? ?DVT Prophylaxis: Resume Eliquis ?Code Status: Full code ?Family Communication: Discussed with patient ?Disposition Plan: SNF in the next 24 to 48 hours ? ?Status is: Inpatient ?Remains inpatient appropriate because: Left hip  fracture ? ? ? ? ? ?Medications: Scheduled: ? calcium-vitamin D  1 tablet Oral Daily  ? Chlorhexidine Gluconate Cloth  6 each Topical Daily  ? dapagliflozin propanediol  10 mg Oral Daily  ? denosumab  60 mg Subcutaneous Q6 months  ? docusate sodium  100 mg Oral BID  ? magnesium oxide  200 mg Oral Daily  ? polyethylene glycol  17 g Oral Daily  ? traMADol  50 mg Oral Q6H  ? ?Continuous: ? methocarbamol (ROBAXIN) IV    ? ?PFX:[id number], clobetasol cream, HYDROcodone-acetaminophen, HYDROcodone-acetaminophen, menthol-cetylpyridinium **OR** phenol, methocarbamol **OR** methocarbamol (ROBAXIN) IV, metoCLOPramide **OR** metoCLOPramide (REGLAN) injection, morphine injection, ondansetron **OR** ondansetron (ZOFRAN) IV, polyethylene glycol ? ?Antibiotics: ?Anti-infectives (From admission, onward)  ? ? Start     Dose/Rate Route Frequency Ordered Stop  ? 12/19/21 1330  ceFAZolin (ANCEF) IVPB 2g/100 mL premix       ? 2 g ?200 mL/hr over 30 Minutes Intravenous Every 6 hours 12/19/21 1106 12/19/21 2009  ? 12/19/21 0647  ceFAZolin (ANCEF) 2-4 GM/100ML-% IVPB       ?Note to Pharmacy: [place] Floor: cabinet override  ?    12/19/21 2426 12/19/21 0740  ? 12/19/21 0600  ceFAZolin (ANCEF) IVPB 2g/100 mL premix       ? 2 g ?200 mL/hr over 30 Minutes Intravenous On call to O.R. 12/19/21 8341 12/19/21 0739  ? ?  ? ? ?Objective: ? ?Vital Signs ? ?Vitals:  ? 12/19/21 1023 12/19/21 1235 12/19/21 2109 12/20/21 0558  ?BP: 126/85 127/62 133/65 (!) 129/55  ?Pulse: 89 94  78 72  ?Resp: 18 18 17 16   ?Temp:  98.8 ?F (37.1 ?C) 98.3 ?F (36.8 ?C) 97.7 ?F (36.5 ?C)  ?TempSrc:  Oral Oral Oral  ?SpO2: 100% 99% 94% 94%  ?Weight:      ?Height:      ? ? ?Intake/Output Summary (Last 24 hours) at 12/20/2021 1053 ?Last data filed at 12/20/2021 0957 ?Gross per 24 hour  ?Intake 1200 ml  ?Output 1650 ml  ?Net -450 ml  ? ?Filed Weights  ? 12/17/21 1349  ?Weight: 77 kg  ? ? ?General appearance: Awake alert.  In no distress ?Resp: Clear to auscultation bilaterally.   Normal effort ?Cardio: S1-S2 is normal regular.  No S3-S4.  No rubs murmurs or bruit ?GI: Abdomen is soft.  Nontender nondistended.  Bowel sounds are present normal.  No masses organomegaly ?Extremities: No bruising noted over the left thigh area. ?Neurologic: Alert and oriented x3.  No focal neurological deficits.  ? ? ?Lab Results: ? ?Data Reviewed: I have personally reviewed following labs and reports of the imaging studies ? ?CBC: ?Recent Labs  ?Lab 12/18/21 ?1433 12/20/21 ?0623  ?WBC 8.7 11.4*  ?HGB 13.6 11.4*  ?HCT 40.3 34.3*  ?MCV 96.4 97.2  ?PLT 172 163  ? ? ?Basic Metabolic Panel: ?Recent Labs  ?Lab 12/18/21 ?1433  ?NA 136  ?K 3.9  ?CL 105  ?CO2 25  ?GLUCOSE 109*  ?BUN 21  ?CREATININE 1.09*  ?CALCIUM 8.5*  ? ? ?GFR: ?Estimated Creatinine Clearance: 37.6 mL/min (A) (by C-G formula based on SCr of 1.09 mg/dL (H)). ? ?Liver Function Tests: ?Recent Labs  ?Lab 12/18/21 ?1433  ?AST 17  ?ALT 16  ?ALKPHOS 24*  ?BILITOT 0.5  ?PROT 7.0  ?ALBUMIN 3.7  ? ? ? ?Coagulation Profile: ?Recent Labs  ?Lab 12/18/21 ?1433  ?INR 1.1  ? ? ? ?Recent Results (from the past 240 hour(s))  ?Surgical PCR screen     Status: None  ? Collection Time: 12/18/21 11:00 PM  ? Specimen: Nasal Mucosa; Nasal Swab  ?Result Value Ref Range Status  ? MRSA, PCR NEGATIVE NEGATIVE Final  ? Staphylococcus aureus NEGATIVE NEGATIVE Final  ?  Comment: (NOTE) ?The Xpert SA Assay (FDA approved for NASAL specimens in patients 3 ?years of age and older), is one component of a comprehensive ?surveillance program. It is not intended to diagnose infection nor to ?guide or monitor treatment. ?Performed at [facility], [address] [place],  [address] ?  ?  ? ? ?Radiology Studies: ?DG C-Arm 1-60 Min-No Report ? ?Result Date: 12/19/2021 ?CLINICAL DATA:  LEFT hip fracture post ORIF. EXAM: DG HIP (WITH OR WITHOUT PELVIS) 2-3V LEFT; DG C-ARM 1-60 MIN-NO REPORT COMPARISON:  Dec 17, 2021. FINDINGS: Intraoperative fluoroscopic images are provided 9 total images.  Michelle images show comminuted intratrochanteric fracture with separate fragment of lesser troch. Subsequent images display insertion of an intramedullary device in the LEFT proximal femur with distal interlocking screw and dynamic hip screw across the femoral neck. The entirety of the construct is not imaged in there is gas in the soft tissues as expected on these intra procedural images. IMPRESSION: Intraoperative fluoroscopic images during ORIF LEFT hip fracture. Limited views without unexpected findings full coverage of femoral nail not provided. Fluoroscopic dose: 34.52 mGy Fluoroscopic time: 1 minute 52 seconds. Electronically Signed   By: Zetta Bills M.D.   On: 12/19/2021 09:17  ? ?DG HIP UNILAT WITH PELVIS 2-3 VIEWS LEFT ? ?Result Date: 12/19/2021 ?CLINICAL DATA:  LEFT hip fracture post  ORIF. EXAM: DG HIP (WITH OR WITHOUT PELVIS) 2-3V LEFT; DG C-ARM 1-60 MIN-NO REPORT COMPARISON:  Dec 17, 2021. FINDINGS: Intraoperative fluoroscopic images are provided 9 total images. Michelle images show comminuted intratrochanteric fracture with separate fragment of lesser troch. Subsequent images display insertion of an intramedullary device in the LEFT proximal femur with distal interlocking screw and dynamic hip screw across the femoral neck. The entirety of the construct is not imaged in there is gas in the soft tissues as expected on these intra procedural images. IMPRESSION: Intraoperative fluoroscopic images during ORIF LEFT hip fracture. Limited views without unexpected findings full coverage of femoral nail not provided. Fluoroscopic dose: 34.52 mGy Fluoroscopic time: 1 minute 52 seconds. Electronically Signed   By: Zetta Bills M.D.   On: 12/19/2021 09:17  ? ?DG FEMUR MIN 2 VIEWS LEFT ? ?Result Date: 12/19/2021 ?CLINICAL DATA:  Postop. EXAM: LEFT FEMUR 2 VIEWS COMPARISON:  LEFT hip XRs, 12/17/2021. Intraoperative fluoroscopic imaging, 12/19/2021. FINDINGS: Intramedullary nail LEFT proximal femoral fixation, in  anatomic alignment. No periprosthetic lucency or fracture. Displaced lesser trochanteric fragment, unchanged. Overlying periarticular hip soft tissue gas. IMPRESSION: IMN fixation of LEFT proximal femoral fractures without radio

## 2021-12-20 NOTE — Plan of Care (Signed)

## 2021-12-21 LAB — CBC
HCT: 34.1 % — ABNORMAL LOW (ref 36.0–46.0)
Hemoglobin: 11 g/dL — ABNORMAL LOW (ref 12.0–15.0)
MCH: 31.4 pg (ref 26.0–34.0)
MCHC: 32.3 g/dL (ref 30.0–36.0)
MCV: 97.4 fL (ref 80.0–100.0)
Platelets: 172 10*3/uL (ref 150–400)
RBC: 3.5 MIL/uL — ABNORMAL LOW (ref 3.87–5.11)
RDW: 13.2 % (ref 11.5–15.5)
WBC: 8.7 10*3/uL (ref 4.0–10.5)
nRBC: 0 % (ref 0.0–0.2)

## 2021-12-21 LAB — BASIC METABOLIC PANEL
Anion gap: 4 — ABNORMAL LOW (ref 5–15)
BUN: 31 mg/dL — ABNORMAL HIGH (ref 8–23)
CO2: 27 mmol/L (ref 22–32)
Calcium: 7.8 mg/dL — ABNORMAL LOW (ref 8.9–10.3)
Chloride: 103 mmol/L (ref 98–111)
Creatinine, Ser: 0.9 mg/dL (ref 0.44–1.00)
GFR, Estimated: >60 mL/min (ref >60)
Glucose, Bld: 96 mg/dL (ref 70–99)
Potassium: 4.1 mmol/L (ref 3.5–5.1)
Sodium: 134 mmol/L — ABNORMAL LOW (ref 135–145)

## 2021-12-21 MED ORDER — SENNOSIDES-DOCUSATE SODIUM 8.6-50 MG PO TABS
2.0000 | ORAL_TABLET | Freq: Two times a day (BID) | ORAL | Status: DC
  Administered 2021-12-21: 2 via ORAL
  Filled 2021-12-21: qty 2

## 2021-12-21 MED ORDER — BISACODYL 10 MG RE SUPP: 10.0000 mg | Freq: Every day | RECTAL | 0 refills | Status: DC | PRN

## 2021-12-21 MED ORDER — POLYETHYLENE GLYCOL 3350 17 G PO PACK
17.0000 g | PACK | Freq: Two times a day (BID) | ORAL | Status: DC
  Administered 2021-12-21: 17 g via ORAL

## 2021-12-21 MED ORDER — SENNOSIDES-DOCUSATE SODIUM 8.6-50 MG PO TABS: 2.0000 | ORAL_TABLET | Freq: Two times a day (BID) | ORAL | Status: DC

## 2021-12-21 MED ORDER — POLYETHYLENE GLYCOL 3350 17 G PO PACK: 17.0000 g | PACK | Freq: Two times a day (BID) | ORAL | 0 refills | Status: AC

## 2021-12-21 MED ORDER — FLEET ENEMA 7-19 GM/118ML RE ENEM: 1.0000 | ENEMA | Freq: Every day | RECTAL | Status: DC | PRN

## 2021-12-21 MED ORDER — BISACODYL 10 MG RE SUPP
10.0000 mg | Freq: Once | RECTAL | Status: AC
  Administered 2021-12-21: 10 mg via RECTAL
  Filled 2021-12-21: qty 1

## 2021-12-21 NOTE — Progress Notes (Signed)
Nsg Discharge Note ? ?Admit Date:  12/17/2021 ?Discharge date: 12/21/2021 ?  ?Alfredia Client to be D/C'd Home per MD order.  AVS completed.   ?Removed IV-CDI. Called report to [facility]. [facility] transported patient to [facility]. ? ?Discharge Medication: ?Allergies as of 12/21/2021   ? ?   Reactions  ? Mercaptopurine Other (See Comments)  ? Dropped WBC  ? Sulfa Antibiotics Other (See Comments)  ? Extreme Weakness  ? ?  ? ?  ?Medication List  ?  ? ?STOP taking these medications   ? ?Calcium 600+D3 Plus Minerals 600-800 MG-UNIT Chew ?  ? ?  ? ?TAKE these medications   ? ?bisacodyl 10 MG suppository ?Commonly known as: DULCOLAX ?Place 1 suppository (10 mg total) rectally daily as needed for severe constipation. ?  ?CALCIUM 600 + D PO ?Take 1 tablet by mouth daily. ?  ?clobetasol 0.05 % external solution ?Commonly known as: TEMOVATE ?Apply 1 application topically daily as needed (psoriasis). ?  ?denosumab 60 MG/ML Soln injection ?Commonly known as: PROLIA ?Inject 60 mg into the skin every 6 (six) months. Administer in upper arm, thigh, or abdomen ?  ?Eliquis 2.5 MG Tabs tablet ?Generic drug: apixaban ?Take 1 tablet (2.5 mg total) by mouth 2 (two) times daily. ?  ?exemestane 25 MG tablet ?Commonly known as: AROMASIN ?TAKE 1 TABLET DAILY AFTER BREAKFAST. ?  ?Farxiga 10 MG Tabs tablet ?Generic drug: dapagliflozin propanediol ?Take 10 mg by mouth daily. ?What changed: Another medication with the same name was removed. Continue taking this medication, and follow the directions you see here. ?  ?Humira Pen 40 MG/0.8ML Pnkt ?Generic drug: Adalimumab ?INJECT 40 MG EVERY 14 DAYS AS DIRECTED. ?What changed: See the new instructions. ?  ?HYDROcodone-acetaminophen 5-325 MG tablet ?Commonly known as: NORCO/VICODIN ?Take 1-2 tablets by mouth every 4 (four) hours as needed for moderate pain (pain score 4-6). ?  ?Magnesium 250 MG Tabs ?Take 250 mg by mouth daily. ?  ?methocarbamol 500 MG tablet ?Commonly known as: ROBAXIN ?Take 1 tablet (500  mg total) by mouth every 6 (six) hours as needed for muscle spasms. ?  ?OVER THE COUNTER MEDICATION ?Take 2-4 tablets by mouth 3 (three) times a week. Healthy Feet and Nerves otc supplement ?  ?polyethylene glycol 17 g packet ?Commonly known as: MIRALAX / GLYCOLAX ?Take 17 g by mouth 2 (two) times daily. ?What changed:  ?when to take this ?additional instructions ?  ?PRESCRIPTION MEDICATION ?Apply 1 application topically daily as needed (irritation). Fluticasone 0.05 % and Ketoconazole 2% 1:1 ?  ?senna-docusate 8.6-50 MG tablet ?Commonly known as: Senokot-S ?Take 2 tablets by mouth 2 (two) times daily. ?  ? ?  ? ?  ?  ? ? ?  ?Discharge Care Instructions  ?(From admission, onward)  ?  ? ? ?  ? ?  Start     Ordered  ? 12/21/21 0000  Discharge wound care:       ?Comments: As per orthopedics  ? 12/21/21 0859  ? ?  ?  ? ?  ? ? ?Discharge Assessment: ?Vitals:  ? 12/21/21 0442 12/21/21 1132  ?BP: (!) 120/57 129/65  ?Pulse: 81 81  ?Resp: 19 20  ?Temp: 98.3 ?F (36.8 ?C) 97.8 ?F (36.6 ?C)  ?SpO2: 94% 99%  ? Skin clean, dry and intact without evidence of skin break down, no evidence of skin tears noted. ?IV catheter discontinued intact. Site without signs and symptoms of complications - no redness or edema noted at insertion site, patient  denies c/o pain - only slight tenderness at site.  Dressing with slight pressure applied. ? ?D/c Instructions-Education: ?Discharge instructions given to patient/family with verbalized understanding. ?D/c education completed with patient/family including follow up instructions, medication list, d/c activities limitations if indicated, with other d/c instructions as indicated by MD - patient able to verbalize understanding, all questions fully answered. ?Patient instructed to return to ED, call 911, or call MD for any changes in condition.  ?Patient escorted via [place], and D/C home via private auto. ? ?Santa Lighter, RN ?12/21/2021 4:44 PM  ?

## 2021-12-21 NOTE — Plan of Care (Signed)
?  Problem: Education: ?Goal: Knowledge of General Education information will improve ?Description: Including pain rating scale, medication(s)/side effects and non-pharmacologic comfort measures ?12/21/2021 1057 by Santa Lighter, RN ?Outcome: Adequate for Discharge ?12/21/2021 0953 by Santa Lighter, RN ?Outcome: Progressing ?  ?Problem: Health Behavior/Discharge Planning: ?Goal: Ability to manage health-related needs will improve ?12/21/2021 1057 by Santa Lighter, RN ?Outcome: Adequate for Discharge ?12/21/2021 0953 by Santa Lighter, RN ?Outcome: Progressing ?  ?Problem: Clinical Measurements: ?Goal: Ability to maintain clinical measurements within normal limits will improve ?Outcome: Adequate for Discharge ?Goal: Will remain free from infection ?12/21/2021 1057 by Santa Lighter, RN ?Outcome: Adequate for Discharge ?12/21/2021 0953 by Santa Lighter, RN ?Outcome: Progressing ?Goal: Diagnostic test results will improve ?Outcome: Adequate for Discharge ?Goal: Respiratory complications will improve ?Outcome: Adequate for Discharge ?Goal: Cardiovascular complication will be avoided ?Outcome: Adequate for Discharge ?  ?Problem: Activity: ?Goal: Risk for activity intolerance will decrease ?Outcome: Adequate for Discharge ?  ?Problem: Nutrition: ?Goal: Adequate nutrition will be maintained ?Outcome: Adequate for Discharge ?  ?Problem: Coping: ?Goal: Level of anxiety will decrease ?Outcome: Adequate for Discharge ?  ?Problem: Elimination: ?Goal: Will not experience complications related to bowel motility ?Outcome: Adequate for Discharge ?Goal: Will not experience complications related to urinary retention ?Outcome: Adequate for Discharge ?  ?Problem: Pain Managment: ?Goal: General experience of comfort will improve ?Outcome: Adequate for Discharge ?  ?Problem: Safety: ?Goal: Ability to remain free from injury will improve ?Outcome: Adequate for Discharge ?  ?Problem: Skin Integrity: ?Goal: Risk for impaired skin integrity will  decrease ?Outcome: Adequate for Discharge ?  ?Problem: Acute Rehab PT Goals(only PT should resolve) ?Goal: Pt Will Go Supine/Side To Sit ?Outcome: Adequate for Discharge ?Goal: Pt Will Go Sit To Supine/Side ?Outcome: Adequate for Discharge ?Goal: Patient Will Transfer Sit To/From Stand ?Outcome: Adequate for Discharge ?Goal: Pt Will Transfer Bed To Chair/Chair To Bed ?Outcome: Adequate for Discharge ?Goal: Pt Will Ambulate ?Outcome: Adequate for Discharge ?Goal: Pt/caregiver will Perform Home Exercise Program ?Outcome: Adequate for Discharge ?  ?Problem: Acute Rehab PT Goals(only PT should resolve) ?Goal: Pt Will Go Supine/Side To Sit ?Outcome: Adequate for Discharge ?Goal: Pt Will Go Sit To Supine/Side ?Outcome: Adequate for Discharge ?Goal: Patient Will Transfer Sit To/From Stand ?Outcome: Adequate for Discharge ?Goal: Pt Will Transfer Bed To Chair/Chair To Bed ?Outcome: Adequate for Discharge ?Goal: Pt Will Ambulate ?Outcome: Adequate for Discharge ?Goal: Pt/caregiver will Perform Home Exercise Program ?Outcome: Adequate for Discharge ?  ?

## 2021-12-21 NOTE — Plan of Care (Signed)
  Problem: Education: Goal: Knowledge of General Education information will improve Description: Including pain rating scale, medication(s)/side effects and non-pharmacologic comfort measures Outcome: Progressing   Problem: Health Behavior/Discharge Planning: Goal: Ability to manage health-related needs will improve Outcome: Progressing   Problem: Clinical Measurements: Goal: Will remain free from infection Outcome: Progressing   

## 2021-12-21 NOTE — Discharge Summary (Signed)
?[facility] ? ?Physician Discharge Summary  ? ?Patient ID: ?Monique Day ?MRN: [id number] ?DOB/AGE: May 24, 1936 86 y.o. ? ?Admit date: 12/17/2021 ?Discharge date:   12/21/2021 ? ? ?PCP: [name], NP ? 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 ?RECOMMENDATIONS FOR OUTPATIENT FOLLOW UP: ?Follow-up with Dr. Aline Brochure with orthopedics in 4 weeks ?Please talk to Dr. Melony Overly regarding administration of Humira. ? ? ?Home Health: Going to SNF ?Equipment/Devices: None ? ?CODE STATUS: Full code ? ?DISCHARGE CONDITION: fair ? ?Diet recommendation: Low-sodium ? ?INITIAL HISTORY: ?86 y.o. 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 XR confirmed proximal left femur fracture for which orthopedics placed IM nail on 5/4 after eliquis washout.  ?  ?Consultants: Dr. Aline Brochure with orthopedics ?  ?Procedures: Intramedullary nail left hip 5/4 ?  ? ?HOSPITAL COURSE:  ? ?Left hip fracture ?Status post IM Dr. Aline Brochure on 5/4.  Vitamin D level is 31.  Pain is adequately controlled.  PT evaluation has been done.  SNF is recommended.  Bowel regimen to be initiated for constipation. ? ?History of recurrent DVTs ?No recent history of same.  Has IVC protoplaced in the setting of hemorrhagic colitis previously.  Eliquis was held at the time of admission and resumed yesterday. ?  ?Postoperative blood loss anemia ?Drop in hemoglobin from 13.6-11.4 noted.  Stable this morning. ? ?History of ulcerative colitis ?Followed by Dr. Laural Golden.  Noted to be on Humira as an outpatient.   ?  ?Chronic kidney disease stage IIIa ?Stable for the most part.   ?  ?Obesity ?Estimated body mass index is 31.05 kg/m? as calculated  from the following: ?  Height as of this encounter: 5' 2"  (1.575 m). ?  Weight as of this encounter: 77 kg. ?  ? ?Patient is stable.  Okay for discharge to SNF. ? ? ?PERTINENT LABS: ? ?The results of significant diagnostics from this hospitalization (including imaging, microbiology, ancillary and laboratory) are listed below for reference.   ? ?Microbiology: ?Recent Results (from the past 240 hour(s))  ?Surgical PCR screen     Status: None  ? Collection Time: 12/18/21 11:00 PM  ? Specimen: Nasal Mucosa; Nasal Swab  ?Result Value Ref Range Status  ? MRSA, PCR NEGATIVE NEGATIVE Final  ? Staphylococcus aureus NEGATIVE NEGATIVE Final  ?  Comment: (NOTE) ?The Xpert SA Assay (FDA approved for NASAL specimens in patients 72 ?years of age and older), is one component of a comprehensive ?surveillance program. It is not intended to diagnose infection nor to ?guide or monitor treatment. ?Performed at [facility], [address] [place], [place] [address] ?  ?  ? ?Labs: ? ?COVID-19 Labs ? ?Lab Results  ?Component Value Date  ? [place] NEGATIVE 04/12/2019  ? ? ? ? ?Basic Metabolic Panel: ?Recent Labs  ?Lab 12/18/21 ?1433 12/21/21 ?0621  ?NA 136 134*  ?K 3.9 4.1  ?CL 105 103  ?CO2 25 27  ?GLUCOSE 109* 96  ?BUN 21 31*  ?CREATININE 1.09* 0.90  ?CALCIUM 8.5* 7.8*  ? ?Liver Function Tests: ?Recent Labs  ?Lab 12/18/21 ?1433  ?AST 17  ?ALT 16  ?ALKPHOS 24*  ?BILITOT 0.5  ?PROT 7.0  ?ALBUMIN 3.7  ? ? ?  CBC: ?Recent Labs  ?Lab 12/18/21 ?1433 12/20/21 ?2297 12/21/21 ?9892  ?WBC 8.7 11.4* 8.7  ?HGB 13.6 11.4* 11.0*  ?HCT 40.3 34.3* 34.1*  ?MCV 96.4 97.2 97.4  ?PLT 172 163 172  ? ? ?IMAGING STUDIES ?DG Chest 1 View ? ?Result Date: 12/17/2021 ?CLINICAL DATA:  Status post fall. EXAM: CHEST  1 VIEW COMPARISON:  October 15, 2018 FINDINGS: There is stable right-sided venous Port-A-Cath positioning. The heart size and mediastinal contours are within normal limits. Mild, diffuse, chronic appearing increased interstitial lung markings are seen.  There is no evidence of focal consolidation, pleural effusion or pneumothorax. Bilateral axillary surgical clips are noted. Degenerative changes seen throughout the thoracic spine. IMPRESSION: Chronic appearing increased interstitial lung markings without acute or active cardiopulmonary disease. Electronically Signed   By: Virgina Norfolk M.D.   On: 12/17/2021 17:08  ? ?DG C-Arm 1-60 Min-No Report ? ?Result Date: 12/19/2021 ?CLINICAL DATA:  LEFT hip fracture post ORIF. EXAM: DG HIP (WITH OR WITHOUT PELVIS) 2-3V LEFT; DG C-ARM 1-60 MIN-NO REPORT COMPARISON:  Dec 17, 2021. FINDINGS: Intraoperative fluoroscopic images are provided 9 total images. Michelle images show comminuted intratrochanteric fracture with separate fragment of lesser troch. Subsequent images display insertion of an intramedullary device in the LEFT proximal femur with distal interlocking screw and dynamic hip screw across the femoral neck. The entirety of the construct is not imaged in there is gas in the soft tissues as expected on these intra procedural images. IMPRESSION: Intraoperative fluoroscopic images during ORIF LEFT hip fracture. Limited views without unexpected findings full coverage of femoral nail not provided. Fluoroscopic dose: 34.52 mGy Fluoroscopic time: 1 minute 52 seconds. Electronically Signed   By: Zetta Bills M.D.   On: 12/19/2021 09:17  ? ?DG HIP UNILAT WITH PELVIS 2-3 VIEWS LEFT ? ?Result Date: 12/19/2021 ?CLINICAL DATA:  LEFT hip fracture post ORIF. EXAM: DG HIP (WITH OR WITHOUT PELVIS) 2-3V LEFT; DG C-ARM 1-60 MIN-NO REPORT COMPARISON:  Dec 17, 2021. FINDINGS: Intraoperative fluoroscopic images are provided 9 total images. Michelle images show comminuted intratrochanteric fracture with separate fragment of lesser troch. Subsequent images display insertion of an intramedullary device in the LEFT proximal femur with distal interlocking screw and dynamic hip screw across the femoral neck. The entirety of the construct is not  imaged in there is gas in the soft tissues as expected on these intra procedural images. IMPRESSION: Intraoperative fluoroscopic images during ORIF LEFT hip fracture. Limited views without unexpected findings full coverage of femoral nail not provided. Fluoroscopic dose: 34.52 mGy Fluoroscopic time: 1 minute 52 seconds. Electronically Signed   By: Zetta Bills M.D.   On: 12/19/2021 09:17  ? ?DG Hip Unilat W or Wo Pelvis 2-3 Views Left ? ?Result Date: 12/17/2021 ?CLINICAL DATA:  Status post fall with left hip pain. EXAM: DG HIP (WITH OR WITHOUT PELVIS) 2-3V LEFT COMPARISON:  None Available. FINDINGS: An acute fracture deformity is seen extending through the inter trochanteric region of the proximal left femur. There is no evidence of dislocation. Moderate to marked severity degenerative changes are seen involving the bilateral hips, in the form of joint space narrowing and acetabular formation. IMPRESSION: Acute fracture of the proximal left femur. Electronically Signed   By: Virgina Norfolk M.D.   On: 12/17/2021 17:06  ? ?DG FEMUR MIN 2 VIEWS LEFT ? ?Result Date: 12/19/2021 ?CLINICAL DATA:  Postop. EXAM: LEFT FEMUR 2 VIEWS COMPARISON:  LEFT hip XRs, 12/17/2021. Intraoperative fluoroscopic imaging, 12/19/2021. FINDINGS: Intramedullary nail LEFT proximal femoral fixation, in anatomic  alignment. No periprosthetic lucency or fracture. Displaced lesser trochanteric fragment, unchanged. Overlying periarticular hip soft tissue gas. IMPRESSION: IMN fixation of LEFT proximal femoral fractures without radiographic evidence of complication. Electronically Signed   By: Michaelle Birks M.D.   On: 12/19/2021 10:12   ? ?DISCHARGE EXAMINATION: ?Vitals:  ? 12/20/21 1426 12/20/21 1937 12/20/21 2137 12/21/21 0442  ?BP: 128/60 (!) 128/51 112/76 (!) 120/57  ?Pulse: 83 75 79 81  ?Resp: 17 18 19 19   ?Temp: 98.2 ?F (36.8 ?C) 98.7 ?F (37.1 ?C) 98.1 ?F (36.7 ?C) 98.3 ?F (36.8 ?C)  ?TempSrc: Oral Oral Oral Oral  ?SpO2: 95% 94% 94% 94%  ?Weight:       ?Height:      ? ?General appearance: Awake alert.  In no distress ?Resp: Clear to auscultation bilaterally.  Normal effort ?Cardio: S1-S2 is normal regular.  No S3-S4.  No rubs murmurs or bruit ?GI: A

## 2021-12-21 NOTE — TOC Progression Note (Signed)
Transition of Care (TOC) - Progression Note  ? ? ?Patient Details  ?Name: Monique Day ?MRN: [id number] ?Date of Birth: 10/19/1935 ? ?Transition of Care (TOC) CM/SW Contact  ?Joanne Chars, LCSW ?Phone Number: ?12/21/2021, 8:44 AM ? ?Clinical Narrative:   SNF auth request approved in [place]: [id number],  [id number], 4 days: 5/6-12/24/21. ? ? ? ?Expected Discharge Plan: [place] ?Barriers to Discharge: Continued Medical Work up ? ?Expected Discharge Plan and Services ?Expected Discharge Plan: [place] ?In-house Referral: Clinical Social Work ?  ?Post Acute Care Choice: [place] ?Living arrangements for the past 2 months: [place] ?                ?  ?  ?  ?  ?  ?  ?  ?  ?  ?  ? ? ?Social Determinants of Health (SDOH) Interventions ?  ? ?Readmission Risk Interventions ?   ? View : No data to display.  ?  ?  ?  ? ? ?

## 2021-12-21 NOTE — TOC Transition Note (Signed)
Transition of Care (TOC) - CM/SW Discharge Note ? ? ?Patient Details  ?Name: Monique Day ?MRN: [id number] ?Date of Birth: 1935/10/04 ? ?Transition of Care [facility]) CM/SW Contact:  ?Joanne Chars, LCSW ?Phone Number: ?12/21/2021, 11:40 AM ? ? ?Clinical Narrative:   Pt [facility] to [facility].  RN call report to Denzil Magnuson, at [phone]. ? ?1130: 1130: Charge RN to fax DC summary to [phone], transport form in epic, RN will call [facility] when ready ? ? ? ?Final next level of care: [place] ?Barriers to Discharge: Barriers Resolved ? ? ?Patient Goals and CMS Choice ?Patient states their goals for this hospitalization and ongoing recovery are:: go to rehab ?CMS Medicare.gov Compare Post Acute Care list provided to:: Patient ?Choice offered to / list presented to : Patient ? ?Discharge Placement ?  ?           ?Patient [facility] at[facility]  [facility] ?Patient to be transferred to facility by: [facility] ?Name of family member notified: unable to reach granddaughter ?Patient and family notified of of transfer: 12/21/21 ? ?Discharge Plan and Services ?In-house Referral: Clinical Social Work ?  ?Post Acute Care Choice: [place]          ?  ?  ?  ?  ?  ?  ?  ?  ?  ?  ? ?Social Determinants of Health (SDOH) Interventions ?  ? ? ?Readmission Risk Interventions ?   ? View : No data to display.  ?  ?  ?  ? ? ? ? ? ?

## 2021-12-21 NOTE — Progress Notes (Signed)
Physical Therapy Treatment ?Patient Details ?Name: Monique Day ?MRN: [id number] ?DOB: 1935-11-29 ?Today's Date: 12/21/2021 ? ? ?History of Present Illness Aydin Cavalieri is a 86 y.o. female with medical history significant for ulcerative colitis, DVT on chronic anticoagulation with Eliquis, stroke.  Patient presented to the ED after a fall with subsequent complaints of right hip pain.  Patient reports she was coming out of a restaurant, she stepped off a curb and was by her car, getting her key out when she felt like her left leg gave out and she fell on her left hip.  She did not hit her head, she did not lose consciousness.  Fall was witnessed by many people.  She denies frequent falls.  She denies headache, no chest pain or difficulty breathing, no dizziness, no focal weakness of extremities, she has maintained good oral intake, she was basically in her normal state of health. ? ?  ?PT Comments  ? ? Patient demonstrates good return for propping up on elbows to hands during supine to sitting with Min assist to move LLE, fair/good return for completing BLE ROM/strengthening exercises while seated at bedside with verbal cueing and active assistance to complete LLE LAQ's and hip raises.  Patient demonstrates slightly increased endurance/distance for taking steps in room with slow labored movement, but limited mostly due to near syncopal episode, had to pull chair close to patient to sit to prevent falling on floor with nursing staff assisting.  Patient's vital signs checked and WNL, patient became fully alert and back to normal once sitting in chair.  Patient tolerated sitting up in chair after therapy - RN aware.  Patient will benefit from continued skilled physical therapy in hospital and recommended venue below to increase strength, balance, endurance for safe ADLs and gait.  ? ?   ?Recommendations for follow up therapy are one component of a multi-disciplinary discharge planning process, led by the  attending physician.  Recommendations may be updated based on patient status, additional functional criteria and insurance authorization. ? ?Follow Up Recommendations ? Skilled nursing-short term rehab (<3 hours/day) ?  ?  ?Assistance Recommended at Discharge Intermittent Supervision/Assistance  ?Patient can return home with the following A lot of help with walking and/or transfers;A lot of help with bathing/dressing/bathroom;Assist for transportation;Help with stairs or ramp for entrance;Assistance with cooking/housework ?  ?Equipment Recommendations ? None recommended by PT  ?  ?Recommendations for Other Services   ? ? ?  ?Precautions / Restrictions Precautions ?Precautions: Fall ?Restrictions ?Weight Bearing Restrictions: No ?LLE Weight Bearing: Weight bearing as tolerated  ?  ? ?Mobility ? Bed Mobility ?Overal bed mobility: Needs Assistance ?Bed Mobility: Supine to Sit ?  ?  ?Supine to sit: Min assist, HOB elevated ?  ?  ?General bed mobility comments: Min assist to move LLE with HOB partially raised approximately 30 degrees ?  ? ?Transfers ?Overall transfer level: Needs assistance ?Equipment used: Rolling walker (2 wheels) ?Transfers: Sit to/from Stand, Bed to chair/wheelchair/BSC ?Sit to Stand: Min assist, Mod assist ?  ?Step pivot transfers: Min assist, Mod assist ?  ?  ?  ?General transfer comment: increased time, labored movement ?  ? ?Ambulation/Gait ?Ambulation/Gait assistance: Min assist, Mod assist ?Gait Distance (Feet): 10 Feet ?Assistive device: Rolling walker (2 wheels) ?Gait Pattern/deviations: Decreased step length - right, Decreased step length - left, Decreased stance time - left, Decreased stride length ?Gait velocity: decreased ?  ?  ?General Gait Details: slightly increased endurance/distance for taking steps in room with slow labored  movement, limited mostly due to near syncopal episode, had to pull chair close to patient to sit ? ? ?Stairs ?  ?  ?  ?  ?  ? ? ?Wheelchair Mobility ?   ? ?Modified Rankin (Stroke Patients Only) ?  ? ? ?  ?Balance Overall balance assessment: Needs assistance ?Sitting-balance support: Feet supported, No upper extremity supported ?Sitting balance-Leahy Scale: Good ?Sitting balance - Comments: seated at EOB ?  ?Standing balance support: During functional activity, Bilateral upper extremity supported ?Standing balance-Leahy Scale: Fair ?Standing balance comment: using RW ?  ?  ?  ?  ?  ?  ?  ?  ?  ?  ?  ?  ? ?  ?Cognition Arousal/Alertness: Awake/alert ?Behavior During Therapy: [facility] for tasks assessed/performed ?Overall Cognitive Status: Within Functional Limits for tasks assessed ?  ?  ?  ?  ?  ?  ?  ?  ?  ?  ?  ?  ?  ?  ?  ?  ?  ?  ?  ? ?  ?Exercises General Exercises - Lower Extremity ?Long Arc Quad: Seated, AROM, AAROM, Strengthening, Both, 10 reps ?Hip Flexion/Marching: Seated, AROM, AAROM, Strengthening, Both, 10 reps ?Toe Raises: Seated, AROM, Strengthening, Both, 15 reps ?Heel Raises: Seated, AROM, Strengthening, Both, 15 reps ? ?  ?General Comments   ?  ?  ? ?Pertinent Vitals/Pain Pain Assessment ?Pain Assessment: 0-10 ?Pain Score: 2  ?Pain Descriptors / Indicators: Sore ?Pain Intervention(s): Limited activity within patient's tolerance, Monitored during session, Repositioned  ? ? ?Home Living   ?  ?  ?  ?  ?  ?  ?  ?  ?  ?   ?  ?Prior Function    ?  ?  ?   ? ?PT Goals (current goals can now be found in the care plan section) Acute Rehab PT Goals ?Patient Stated Goal: Return home after rehab ?PT Goal Formulation: With patient ?Time For Goal Achievement: 01/03/22 ?Potential to Achieve Goals: Good ?Progress towards PT goals: Progressing toward goals ? ?  ?Frequency ? ? ? Min 3X/week ? ? ? ?  ?PT Plan Current plan remains appropriate  ? ? ?Co-evaluation   ?  ?  ?  ?  ? ?  ?AM-PAC PT "6 Clicks" Mobility   ?Outcome Measure ? Help needed turning from your back to your side while in a flat bed without using bedrails?: A Little ?Help needed moving from lying on your  back to sitting on the side of a flat bed without using bedrails?: A Little ?Help needed moving to and from a bed to a chair (including a wheelchair)?: A Lot ?Help needed standing up from a chair using your arms (e.g., wheelchair or bedside chair)?: A Lot ?Help needed to walk in hospital room?: A Lot ?Help needed climbing 3-5 steps with a railing? : A Lot ?6 Click Score: 14 ? ?  ?End of Session   ?Activity Tolerance: Patient tolerated treatment well;Patient limited by fatigue ?Patient left: in chair;with call bell/phone within reach ?Nurse Communication: Mobility status ?PT Visit Diagnosis: Unsteadiness on feet (R26.81);Other abnormalities of gait and mobility (R26.89);History of falling (Z91.81);Muscle weakness (generalized) (M62.81) ?  ? ? ?Time: 5597-4163 ?PT Time Calculation (min) (ACUTE ONLY): 20 min ? ?Charges:  $Therapeutic Exercise: 8-22 mins ?$Therapeutic Activity: 8-22 mins          ?          ? ?12:24 PM, 12/21/21 ?Lonell Grandchild, MPT ?Physical Therapist with [place] ?  [facility] ?[phone] office ?[phone] mobile phone ? ? ?

## 2021-12-23 ENCOUNTER — Encounter (HOSPITAL_COMMUNITY): Payer: Self-pay | Admitting: Orthopedic Surgery

## 2022-01-02 ENCOUNTER — Ambulatory Visit (INDEPENDENT_AMBULATORY_CARE_PROVIDER_SITE_OTHER): Payer: Medicare PPO | Admitting: Orthopedic Surgery

## 2022-01-02 ENCOUNTER — Ambulatory Visit (INDEPENDENT_AMBULATORY_CARE_PROVIDER_SITE_OTHER): Payer: Medicare PPO

## 2022-01-02 DIAGNOSIS — S72002D Fracture of unspecified part of neck of left femur, subsequent encounter for closed fracture with routine healing: Secondary | ICD-10-CM

## 2022-01-02 NOTE — Progress Notes (Signed)
Chief Complaint  Patient presents with   Follow-up    Recheck on left hip, DOS 12-19-21.    First postop check check left hip incision x-ray looks good  Clinical alignment looks good  Patient doing well going home Sunday from [place] home  Follow-up 4 weeks repeat x-ray

## 2022-01-09 ENCOUNTER — Telehealth: Payer: Self-pay | Admitting: Orthopedic Surgery

## 2022-01-09 NOTE — Telephone Encounter (Signed)
Call from [facility], physical therapist Hilliard Clark, requesting verbal orders - may leave a message on secure voice mail at [phone]:  Request for start of care for home therapy :  1 time a week for 1 week                            2 times a week for 2 weeks                                         1 time a week for 1 week

## 2022-01-09 NOTE — Telephone Encounter (Signed)
Called with verbal orders.

## 2022-01-20 ENCOUNTER — Other Ambulatory Visit (HOSPITAL_COMMUNITY): Payer: Medicare PPO

## 2022-01-25 ENCOUNTER — Other Ambulatory Visit: Payer: Self-pay | Admitting: Nurse Practitioner

## 2022-01-27 ENCOUNTER — Ambulatory Visit (HOSPITAL_COMMUNITY): Payer: Medicare PPO | Admitting: Hematology

## 2022-01-30 ENCOUNTER — Encounter: Payer: Self-pay | Admitting: Orthopedic Surgery

## 2022-01-30 ENCOUNTER — Ambulatory Visit (INDEPENDENT_AMBULATORY_CARE_PROVIDER_SITE_OTHER): Payer: Medicare PPO | Admitting: Orthopedic Surgery

## 2022-01-30 ENCOUNTER — Ambulatory Visit (INDEPENDENT_AMBULATORY_CARE_PROVIDER_SITE_OTHER): Payer: Medicare PPO

## 2022-01-30 DIAGNOSIS — S72002D Fracture of unspecified part of neck of left femur, subsequent encounter for closed fracture with routine healing: Secondary | ICD-10-CM

## 2022-01-30 NOTE — Progress Notes (Signed)
FOLLOW UP   Encounter Diagnosis  Name Primary?   Closed hip fracture, left, with routine healing, subsequent encounter Yes     Chief Complaint  Patient presents with   Routine Post Op    Lt hip DOS 12/19/21    6 weeks postop IM nail left hip with Arthrex nail.  Doing well using a cane walking well only complaint is some tightness on the lateral knee area  Does not seem to be really related to the hip expect this to resolve with time  X-ray again in 5 weeks

## 2022-02-19 ENCOUNTER — Telehealth: Payer: Self-pay | Admitting: Orthopedic Surgery

## 2022-02-19 DIAGNOSIS — S72002D Fracture of unspecified part of neck of left femur, subsequent encounter for closed fracture with routine healing: Secondary | ICD-10-CM

## 2022-02-19 NOTE — Telephone Encounter (Signed)
Voice message received from patient asking if she may have her out-patient therapy ordered at [facility]. States she has finished her home therapy. Please advise. Ph# [phone]

## 2022-02-19 NOTE — Telephone Encounter (Signed)
Physical therapy has been ordered for her in [place] . She  will need to call and schedule the phone number is [phone] I called her to advise. She voiced understanding

## 2022-02-24 ENCOUNTER — Ambulatory Visit: Payer: Medicare PPO | Attending: Orthopedic Surgery

## 2022-02-24 ENCOUNTER — Other Ambulatory Visit: Payer: Self-pay

## 2022-02-24 DIAGNOSIS — M25652 Stiffness of left hip, not elsewhere classified: Secondary | ICD-10-CM

## 2022-02-24 DIAGNOSIS — R262 Difficulty in walking, not elsewhere classified: Secondary | ICD-10-CM

## 2022-02-24 DIAGNOSIS — S72002D Fracture of unspecified part of neck of left femur, subsequent encounter for closed fracture with routine healing: Secondary | ICD-10-CM | POA: Insufficient documentation

## 2022-02-24 NOTE — Therapy (Signed)
OUTPATIENT PHYSICAL THERAPY LOWER EXTREMITY EVALUATION   Patient Name: Monique Day MRN: [id number] DOB:[birth year], 86 y.o., female Today's Date: 02/24/2022   PT End of Session - 02/24/22 1110     Visit Number 1    Number of Visits 8    Date for PT Re-Evaluation 04/11/22    PT Start Time 1112    PT Stop Time 1206    PT Time Calculation (min) 54 min    Activity Tolerance Patient tolerated treatment well    Behavior During Therapy WFL for tasks assessed/performed             Past Medical History:  Diagnosis Date   Adult idiopathic generalized osteoporosis    Arthritis    "minor" (08/18/2018)   Breast cancer, left breast ([place]) 1998   lumpectomy   Breast cancer, right breast ([place]) 2009   lumpectomy; mastectomy   DVT (deep venous thrombosis) ([place]) "early 2000's"   RLE   DVT (deep venous thrombosis) ([place])    Hemorrhoids, external    History of blood transfusion    "related to ulcerative colitis"   Lower abdominal pain    Occult blood in stools    Peripheral edema    Seizures ([place])    one very slight seizure with the stroke; somewhere between 2005-2009   Stroke [facility]) 2005-2009   "very light;" ; denies residual on 08/19/2018   UC (ulcerative colitis) ([place]) dx'd 0539   Past Surgical History:  Procedure Laterality Date   APPENDECTOMY     BIOPSY  04/14/2019   Procedure: BIOPSY;  Surgeon: [name], MD;  Location[facility];  Service: Endoscopy;;   bone density  12/11/10   BREAST BIOPSY Left 1998; 2019 X 2   BREAST BIOPSY Right 2009   BREAST LUMPECTOMY Left 1998   CATARACT EXTRACTION W/ INTRAOCULAR LENS  IMPLANT, BILATERAL Bilateral    COLONOSCOPY  02/21/2011   COLONOSCOPY  10/06/2008   COLONOSCOPY  12/27/01   COLONOSCOPY  05/09/05   COLONOSCOPY N/A 07/02/2016   Procedure: COLONOSCOPY;  Surgeon: [name], MD;  Location: [facility];  Service: Endoscopy;  Laterality: N/A;  1055   COLONOSCOPY N/A 07/04/2021   Procedure: COLONOSCOPY;  Surgeon:  [name], MD;  Location[facility];  Service: Endoscopy;  Laterality: N/A;  7:30   [place]   "I was having rectal bleeding"   FLEXIBLE SIGMOIDOSCOPY N/A 04/14/2019   Procedure: FLEXIBLE SIGMOIDOSCOPY;  Surgeon: [name], MD;  [facility];  Service: Endoscopy;  Laterality: N/A;  1:00   FRACTURE SURGERY     INTRAMEDULLARY (IM) NAIL INTERTROCHANTERIC Left 12/19/2021   Procedure: INTRAMEDULLARY (IM) NAIL INTERTROCHANTRIC;  Surgeon: Carole Civil, MD;  Location: [facility] ORS;  Service: Orthopedics;  Laterality: Left;   MASTECTOMY Right 2009   MASTECTOMY COMPLETE / SIMPLE Left 08/19/2018   POLYPECTOMY  07/04/2021   Procedure: POLYPECTOMY;  Surgeon: [name], MD;  [facility];  Service: Endoscopy;;   PORTACATH PLACEMENT Right 10/15/2018   Procedure: INSERTION PORT-A-CATH RIGHT SUBCLAVIN;  Surgeon: [name], MD;  Location: [facility];  Service: General;  Laterality: Right;  pt knows to arrive at 7:30   [place] Left 08/19/2018   Procedure: LEFT SIMPLE MASTECTOMY;  Surgeon: [name], MD;  Location: [place];  Service: General;  Laterality: Left;   TONSILLECTOMY     VENA CAVA FILTER PLACEMENT Right    WRIST FRACTURE SURGERY  Right    Patient Active Problem List   Diagnosis Date Noted   Displaced intertrochanteric fracture of left femur, initial encounter for closed fracture ([place]) 12/17/2021   Constipation 04/02/2021   Rectal bleeding 04/05/2019   Ulcerative pancolitis without complication ([place]) 69/62/9528   Breast cancer, stage 1, left ([place]) 08/19/2018   Breast cancer of upper-inner quadrant of left female breast ([place]) 07/21/2018   Recurrent deep vein thrombosis (DVT) of right lower extremity ([place]) 03/08/2018   RBBB 03/08/2018   Hx of colonic polyps 03/14/2016   S/P IVC filter    Leukocytosis 07/12/2012   Hypotension 07/12/2012   Hyperglycemia 07/12/2012   Anemia 06/28/2012    Anemia due to blood loss, acute 06/09/2012   Hematochezia 06/09/2012   Warfarin-induced coagulopathy ([place]) 06/09/2012   Hyponatremia 06/07/2012   Dehydration 06/07/2012   UC (ulcerative colitis confined to rectum) ([place]) 11/05/2011   DVT (deep venous thrombosis) ([place]) 11/05/2011    PCP: [name], NP   REFERRING PROVIDER: Carole Civil, MD  REFERRING DIAG: Closed hip fracture, left, with routine healing  THERAPY DIAG:  Difficulty in walking, not elsewhere classified  Stiffness of left hip, not elsewhere classified  Rationale for Evaluation and Treatment Rehabilitation  ONSET DATE: Fracture: 12/17/21 Surgical repair: 12/19/21  SUBJECTIVE:   SUBJECTIVE STATEMENT: Patient reports that she fell while walking to her car on 5/2. She then had her fracture surgically repaired on 5/4. She had inpatient rehab for about 2 weeks. She then had home health for another 6 weeks which seemed to really help. She has experienced some muscle spasms for about the last three weeks which has caused her to use her cane. However, before this happened she was able to walk some without the cane.   NEXT MD FOLLOW UP:   7/24  PERTINENT HISTORY: OA, osteoporosis, history of TIA history of breast cancer, neuropathy   PAIN:  Are you having pain? No  PRECAUTIONS: Fall  WEIGHT BEARING RESTRICTIONS No  FALLS:  Has patient fallen in last 6 months? Yes. 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 [address]  PLOF: [place] walk without a cane, return to her volunteering ([employer], [employer] on [address], and church activities), walk down to her basement to do her laundry    OBJECTIVE:   PATIENT SURVEYS:  FOTO 51.85  COGNITION:  Overall cognitive status:  Within functional limits for tasks assessed     SENSATION: WFL  POSTURE: rounded shoulders and forward head  PALPATION: No tenderness to palpation in LLE   JOINT MOBILITY:  Mild hypomobility of left hip   LOWER EXTREMITY ROM:  Active ROM Right eval Left eval  Hip flexion 97 degrees 87 degrees  Hip extension    Hip abduction 23 degrees 12 degrees  Hip adduction    Hip internal rotation    Hip external rotation    Knee flexion    Knee extension    Ankle dorsiflexion    Ankle plantarflexion    Ankle inversion    Ankle eversion     (Blank rows = not tested)  LOWER EXTREMITY MMT:  MMT Right eval Left eval  Hip flexion 4+/5 4/5  Hip extension    Hip abduction    Hip adduction    Hip internal  rotation    Hip external rotation    Knee flexion 4+/5 4/5  Knee extension 5/5 4+/5 with left knee pain   Ankle dorsiflexion    Ankle plantarflexion    Ankle inversion    Ankle eversion     (Blank rows = not tested)  FUNCTIONAL TESTS:  5 times sit to stand: 12.17 seconds without UE support   GAIT: Assistive device utilized: Quad cane small base Level of assistance: Modified independence Comments: decreased stride length  STAIR MOBILITY:  Assistive device utilized: quad cane small base and 1 railing  Comments: leads with RLE when ascending and LLE when descending  TODAY'S TREATMENT:                                   7/10 EXERCISE LOG  Exercise Repetitions and Resistance Comments  Bridge  15 reps   Side lying hip abduction  LLE; 15 reps                 Blank cell = exercise not performed today    PATIENT EDUCATION:  Education details: healing, goals, POC  Person educated: Patient Education method: Explanation Education comprehension: verbalized understanding   HOME EXERCISE PROGRAM: Reviewed home health HEP  ASSESSMENT:  CLINICAL IMPRESSION: Patient is a 86 y.o. female who was seen today for physical therapy evaluation and treatment for a left hip  fracture. She presented with no left hip pain and discomfort. Her primary limitation is her gait and functional mobility such as navigating stairs. Recommend that she continue with skilled physical therapy to address her remaining impairments to return to her prior level of function.     OBJECTIVE IMPAIRMENTS Abnormal gait, decreased activity tolerance, decreased balance, decreased mobility, difficulty walking, decreased ROM, decreased strength, and hypomobility.   ACTIVITY LIMITATIONS carrying, lifting, stairs, and locomotion level  PARTICIPATION LIMITATIONS: meal prep, cleaning, laundry, and community activity  PERSONAL FACTORS 3+ comorbidities: HTN, history of breast cancer, OA, and osteoporosis  are also affecting patient's functional outcome.   REHAB POTENTIAL: Excellent  CLINICAL DECISION MAKING: Stable/uncomplicated  EVALUATION COMPLEXITY: Low   GOALS: Goals reviewed with patient? Yes  LONG TERM GOALS: Target date: 03/24/2022   Patient will be independent with her HEP.  Baseline:  Goal status: INITIAL  2.  Patient will be able to navigate at least 4 steps with a reciprocal pattern for improved function navigating her house.  Baseline:  Goal status: INITIAL  3.  Patient will be able to walk at least 15 minutes without an assistive device for improved community mobility.  Baseline:  Goal status: INITIAL  4.  Patient will be able to walk without an assistive device without significant gait deviations for improved mobility.  Baseline:  Goal status: INITIAL  5.  Patient will be able to return to her community activities without being limited by her left hip.  Baseline:  Goal status: INITIAL   PLAN: PT FREQUENCY: 2x/week  PT DURATION: 4 weeks  PLANNED INTERVENTIONS: Therapeutic exercises, Therapeutic activity, Neuromuscular re-education, Balance training, Gait training, Patient/Family education, Joint mobilization, Electrical stimulation, Moist heat, Taping,  Vasopneumatic device, Manual therapy, and Re-evaluation  PLAN FOR NEXT SESSION: nustep, step ups, and functional strengthening    Darlin Coco, PT 02/24/2022, 12:37 PM

## 2022-02-25 ENCOUNTER — Ambulatory Visit: Payer: Medicare PPO | Admitting: Physical Therapy

## 2022-02-25 ENCOUNTER — Encounter: Payer: Self-pay | Admitting: Physical Therapy

## 2022-02-25 DIAGNOSIS — R262 Difficulty in walking, not elsewhere classified: Secondary | ICD-10-CM | POA: Diagnosis not present

## 2022-02-25 DIAGNOSIS — M25652 Stiffness of left hip, not elsewhere classified: Secondary | ICD-10-CM

## 2022-02-25 NOTE — Therapy (Signed)
OUTPATIENT PHYSICAL THERAPY LOWER EXTREMITY EVALUATION   Patient Name: Monique Day MRN: [id number] DOB:[birth year], 86 y.o., female Today's Date: 02/25/2022   PT End of Session - 02/25/22 1346     Visit Number 2    Number of Visits 8    Date for PT Re-Evaluation 04/11/22    PT Start Time 1301    PT Stop Time 1700    PT Time Calculation (min) 41 min    Activity Tolerance Patient tolerated treatment well    Behavior During Therapy WFL for tasks assessed/performed              Past Medical History:  Diagnosis Date   Adult idiopathic generalized osteoporosis    Arthritis    "minor" (08/18/2018)   Breast cancer, left breast ([place]) 1998   lumpectomy   Breast cancer, right breast ([place]) 2009   lumpectomy; mastectomy   DVT (deep venous thrombosis) ([place]) "early 2000's"   RLE   DVT (deep venous thrombosis) ([place])    Hemorrhoids, external    History of blood transfusion    "related to ulcerative colitis"   Lower abdominal pain    Occult blood in stools    Peripheral edema    Seizures ([place])    one very slight seizure with the stroke; somewhere between 2005-2009   Stroke [facility]) 2005-2009   "very light;" ; denies residual on 08/19/2018   UC (ulcerative colitis) ([place]) dx'd 1749   Past Surgical History:  Procedure Laterality Date   APPENDECTOMY     BIOPSY  04/14/2019   Procedure: BIOPSY;  Surgeon: [name], MD;  Location[facility];  Service: Endoscopy;;   bone density  12/11/10   BREAST BIOPSY Left 1998; 2019 X 2   BREAST BIOPSY Right 2009   BREAST LUMPECTOMY Left 1998   CATARACT EXTRACTION W/ INTRAOCULAR LENS  IMPLANT, BILATERAL Bilateral    COLONOSCOPY  02/21/2011   COLONOSCOPY  10/06/2008   COLONOSCOPY  12/27/01   COLONOSCOPY  05/09/05   COLONOSCOPY N/A 07/02/2016   Procedure: COLONOSCOPY;  Surgeon: [name], MD;  Location: [facility];  Service: Endoscopy;  Laterality: N/A;  1055   COLONOSCOPY N/A 07/04/2021   Procedure: COLONOSCOPY;  Surgeon:  [name], MD;  Location: [facility];  Service: Endoscopy;  Laterality: N/A;  7:30   [place]   "I was having rectal bleeding"   FLEXIBLE SIGMOIDOSCOPY N/A 04/14/2019   Procedure: FLEXIBLE SIGMOIDOSCOPY;  Surgeon: [name], MD;  Location: [facility];  Service: Endoscopy;  Laterality: N/A;  1:00   FRACTURE SURGERY     INTRAMEDULLARY (IM) NAIL INTERTROCHANTERIC Left 12/19/2021   Procedure: INTRAMEDULLARY (IM) NAIL INTERTROCHANTRIC;  Surgeon: Carole Civil, MD;  Location: [facility] ORS;  Service: Orthopedics;  Laterality: Left;   MASTECTOMY Right 2009   MASTECTOMY COMPLETE / SIMPLE Left 08/19/2018   POLYPECTOMY  07/04/2021   Procedure: POLYPECTOMY;  Surgeon: [name], MD;  Location: [facility];  Service: Endoscopy;;   PORTACATH PLACEMENT Right 10/15/2018   Procedure: INSERTION PORT-A-CATH RIGHT SUBCLAVIN;  Surgeon: [name], MD;  Location: [facility] ORS;  Service: General;  Laterality: Right;  pt knows to arrive at 7:30   [place] Left 08/19/2018   Procedure: LEFT SIMPLE MASTECTOMY;  Surgeon: [name], MD;  Location: [place];  Service: General;  Laterality: Left;   TONSILLECTOMY     VENA CAVA FILTER PLACEMENT Right    WRIST FRACTURE  SURGERY Right    Patient Active Problem List   Diagnosis Date Noted   Displaced intertrochanteric fracture of left femur, initial encounter for closed fracture ([place]) 12/17/2021   Constipation 04/02/2021   Rectal bleeding 04/05/2019   Ulcerative pancolitis without complication ([place]) 94/49/6759   Breast cancer, stage 1, left ([place]) 08/19/2018   Breast cancer of upper-inner quadrant of left female breast ([place]) 07/21/2018   Recurrent deep vein thrombosis (DVT) of right lower extremity ([place]) 03/08/2018   RBBB 03/08/2018   Hx of colonic polyps 03/14/2016   S/P IVC filter    Leukocytosis 07/12/2012   Hypotension 07/12/2012   Hyperglycemia 07/12/2012   Anemia 06/28/2012    Anemia due to blood loss, acute 06/09/2012   Hematochezia 06/09/2012   Warfarin-induced coagulopathy ([place]) 06/09/2012   Hyponatremia 06/07/2012   Dehydration 06/07/2012   UC (ulcerative colitis confined to rectum) ([place]) 11/05/2011   DVT (deep venous thrombosis) ([place]) 11/05/2011   REFERRING PROVIDER: Carole Civil, MD  REFERRING DIAG: Closed hip fracture, left, with routine healing  THERAPY DIAG:  Difficulty in walking, not elsewhere classified  Stiffness of left hip, not elsewhere classified  Rationale for Evaluation and Treatment Rehabilitation  ONSET DATE: Fracture: 12/17/21 Surgical repair: 12/19/21  SUBJECTIVE:   SUBJECTIVE STATEMENT: Denies any pain but did stand for one hour this morning while cooking.  NEXT MD FOLLOW UP:   03/10/2022  PERTINENT HISTORY: OA, osteoporosis, history of TIA history of breast cancer, neuropathy   PAIN:  Are you having pain? No  PRECAUTIONS: Fall    OBJECTIVE:   PATIENT SURVEYS:  FOTO 51.85  TODAY'S TREATMENT:                                   7/10 EXERCISE LOG  Exercise Repetitions and Resistance Comments  Nustep L2 x16 min   L forward step 6" x20 reps   L side step 6" step x20 reps   B heel/toe raises  X20 reps   B hip marching Standing; x20 reps   B hip abduction Standing; x20 reps   LAQ 3# BLE x20 reps   B hip marching Sitting 3# x20 reps   Sit/stand X10 reps no UE support   Hip clam Red; x20 reps    Blank cell = exercise not performed today    PATIENT EDUCATION:  Education details: healing, goals, POC  Person educated: Patient Education method: Explanation Education comprehension: verbalized understanding   HOME EXERCISE PROGRAM: Reviewed home health HEP  ASSESSMENT:  CLINICAL IMPRESSION: Patient presented in clinic with great independence as she stood cooking for up to an hour today. Patient able to complete sit <> stands without UE support. Patient did fatigue with standing activities. Patient using a  SPC with standing base but denies any current functional limitations at home. Patient did indicate more difficulty with standing L hip flexion than with RLE.   OBJECTIVE IMPAIRMENTS Abnormal gait, decreased activity tolerance, decreased balance, decreased mobility, difficulty walking, decreased ROM, decreased strength, and hypomobility.   ACTIVITY LIMITATIONS carrying, lifting, stairs, and locomotion level  PARTICIPATION LIMITATIONS: meal prep, cleaning, laundry, and community activity  PERSONAL FACTORS 3+ comorbidities: HTN, history of breast cancer, OA, and osteoporosis  are also affecting patient's functional outcome.   REHAB POTENTIAL: Excellent  CLINICAL DECISION MAKING: Stable/uncomplicated   GOALS: Goals reviewed with patient? Yes  LONG TERM GOALS: Target date: 03/24/2022   Patient will be independent with her HEP.  Baseline:  Goal status: INITIAL  2.  Patient will be able to navigate at least 4 steps with a reciprocal pattern for improved function navigating her house.  Baseline:  Goal status: INITIAL  3.  Patient will be able to walk at least 15 minutes without an assistive device for improved community mobility.  Baseline:  Goal status: INITIAL  4.  Patient will be able to walk without an assistive device without significant gait deviations for improved mobility.  Baseline:  Goal status: INITIAL  5.  Patient will be able to return to her community activities without being limited by her left hip.  Baseline:  Goal status: INITIAL   PLAN: PT FREQUENCY: 2x/week  PT DURATION: 4 weeks  PLANNED INTERVENTIONS: Therapeutic exercises, Therapeutic activity, Neuromuscular re-education, Balance training, Gait training, Patient/Family education, Joint mobilization, Electrical stimulation, Moist heat, Taping, Vasopneumatic device, Manual therapy, and Re-evaluation  PLAN FOR NEXT SESSION: nustep, step ups, and functional strengthening    Standley Brooking, PTA 02/25/2022,  1:49 PM

## 2022-02-27 ENCOUNTER — Inpatient Hospital Stay (HOSPITAL_COMMUNITY): Payer: Medicare PPO | Attending: Hematology

## 2022-02-27 ENCOUNTER — Other Ambulatory Visit (HOSPITAL_COMMUNITY): Payer: Medicare PPO

## 2022-02-27 DIAGNOSIS — M8580 Other specified disorders of bone density and structure, unspecified site: Secondary | ICD-10-CM | POA: Insufficient documentation

## 2022-02-27 DIAGNOSIS — Z79811 Long term (current) use of aromatase inhibitors: Secondary | ICD-10-CM | POA: Diagnosis not present

## 2022-02-27 DIAGNOSIS — Z7901 Long term (current) use of anticoagulants: Secondary | ICD-10-CM | POA: Insufficient documentation

## 2022-02-27 DIAGNOSIS — Z86718 Personal history of other venous thrombosis and embolism: Secondary | ICD-10-CM | POA: Diagnosis not present

## 2022-02-27 DIAGNOSIS — G479 Sleep disorder, unspecified: Secondary | ICD-10-CM | POA: Diagnosis not present

## 2022-02-27 DIAGNOSIS — C50212 Malignant neoplasm of upper-inner quadrant of left female breast: Secondary | ICD-10-CM | POA: Insufficient documentation

## 2022-02-27 DIAGNOSIS — Z882 Allergy status to sulfonamides status: Secondary | ICD-10-CM | POA: Insufficient documentation

## 2022-02-27 DIAGNOSIS — Z170 Estrogen receptor positive status [ER+]: Secondary | ICD-10-CM | POA: Diagnosis not present

## 2022-02-27 DIAGNOSIS — Z79899 Other long term (current) drug therapy: Secondary | ICD-10-CM | POA: Diagnosis not present

## 2022-02-27 LAB — COMPREHENSIVE METABOLIC PANEL
ALT: 17 U/L (ref 0–44)
AST: 20 U/L (ref 15–41)
Albumin: 3.9 g/dL (ref 3.5–5.0)
Alkaline Phosphatase: 77 U/L (ref 38–126)
Anion gap: 7 (ref 5–15)
BUN: 22 mg/dL (ref 8–23)
CO2: 26 mmol/L (ref 22–32)
Calcium: 9 mg/dL (ref 8.9–10.3)
Chloride: 103 mmol/L (ref 98–111)
Creatinine, Ser: 1.25 mg/dL — ABNORMAL HIGH (ref 0.44–1.00)
GFR, Estimated: 43 mL/min — ABNORMAL LOW (ref >60)
Glucose, Bld: 146 mg/dL — ABNORMAL HIGH (ref 70–99)
Potassium: 3.3 mmol/L — ABNORMAL LOW (ref 3.5–5.1)
Sodium: 136 mmol/L (ref 135–145)
Total Bilirubin: 0.5 mg/dL (ref 0.3–1.2)
Total Protein: 7.1 g/dL (ref 6.5–8.1)

## 2022-02-27 LAB — CBC WITH DIFFERENTIAL/PLATELET
Abs Immature Granulocytes: 0.02 10*3/uL (ref 0.00–0.07)
Basophils Absolute: 0 10*3/uL (ref 0.0–0.1)
Basophils Relative: 0 %
Eosinophils Absolute: 0.2 10*3/uL (ref 0.0–0.5)
Eosinophils Relative: 2 %
HCT: 42.5 % (ref 36.0–46.0)
Hemoglobin: 14.2 g/dL (ref 12.0–15.0)
Immature Granulocytes: 0 %
Lymphocytes Relative: 28 %
Lymphs Abs: 2.1 10*3/uL (ref 0.7–4.0)
MCH: 32.2 pg (ref 26.0–34.0)
MCHC: 33.4 g/dL (ref 30.0–36.0)
MCV: 96.4 fL (ref 80.0–100.0)
Monocytes Absolute: 0.6 10*3/uL (ref 0.1–1.0)
Monocytes Relative: 8 %
Neutro Abs: 4.5 10*3/uL (ref 1.7–7.7)
Neutrophils Relative %: 62 %
Platelets: 186 10*3/uL (ref 150–400)
RBC: 4.41 MIL/uL (ref 3.87–5.11)
RDW: 12.9 % (ref 11.5–15.5)
WBC: 7.4 10*3/uL (ref 4.0–10.5)
nRBC: 0 % (ref 0.0–0.2)

## 2022-02-27 LAB — VITAMIN D 25 HYDROXY (VIT D DEFICIENCY, FRACTURES): Vit D, 25-Hydroxy: 29.59 ng/mL — ABNORMAL LOW (ref 30–100)

## 2022-02-27 MED ORDER — HEPARIN SOD (PORK) LOCK FLUSH 100 UNIT/ML IV SOLN
500.0000 [IU] | Freq: Once | INTRAVENOUS | Status: AC
  Administered 2022-02-27: 500 [IU] via INTRAVENOUS

## 2022-02-27 MED ORDER — SODIUM CHLORIDE 0.9% FLUSH
10.0000 mL | INTRAVENOUS | Status: DC | PRN
  Administered 2022-02-27: 10 mL

## 2022-02-27 NOTE — Progress Notes (Signed)
Monique Day presented for Portacath access and flush with labs.  Portacath located right chest wall accessed with  H 20 needle.  Good blood return present. Portacath flushed with 21m NS and 500U/571mHeparin and needle removed intact. No bruising or swelling noted at the site  Procedure tolerated well and without incident.     Discharged from clinic ambulatory with cane in stable condition. Alert and oriented x 3. [facility]

## 2022-03-04 ENCOUNTER — Ambulatory Visit: Payer: Medicare PPO

## 2022-03-04 DIAGNOSIS — M25652 Stiffness of left hip, not elsewhere classified: Secondary | ICD-10-CM

## 2022-03-04 DIAGNOSIS — R262 Difficulty in walking, not elsewhere classified: Secondary | ICD-10-CM | POA: Diagnosis not present

## 2022-03-04 NOTE — Therapy (Signed)
OUTPATIENT PHYSICAL THERAPY LOWER EXTREMITY TREATMENT   Patient Name: Monique Day MRN: [id number] DOB:November 29, 1935, 86 y.o., female Today's Date: 03/04/2022   PT End of Session - 03/04/22 1034     Visit Number 3    Number of Visits 8    Date for PT Re-Evaluation 04/11/22    PT Start Time 1030    PT Stop Time 1115    PT Time Calculation (min) 45 min    Activity Tolerance Patient tolerated treatment well    Behavior During Therapy WFL for tasks assessed/performed              Past Medical History:  Diagnosis Date   Adult idiopathic generalized osteoporosis    Arthritis    "minor" (08/18/2018)   Breast cancer, left breast ([place]) 1998   lumpectomy   Breast cancer, right breast ([place]) 2009   lumpectomy; mastectomy   DVT (deep venous thrombosis) ([place]) "early 2000's"   RLE   DVT (deep venous thrombosis) ([place])    Hemorrhoids, external    History of blood transfusion    "related to ulcerative colitis"   Lower abdominal pain    Occult blood in stools    Peripheral edema    Seizures ([place])    one very slight seizure with the stroke; somewhere between 2005-2009   Stroke [facility]) 2005-2009   "very light;" ; denies residual on 08/19/2018   UC (ulcerative colitis) ([place]) dx'd 7353   Past Surgical History:  Procedure Laterality Date   APPENDECTOMY     BIOPSY  04/14/2019   Procedure: BIOPSY;  Surgeon: [name], MD;  [facility];  Service: Endoscopy;;   bone density  12/11/10   BREAST BIOPSY Left 1998; 2019 X 2   BREAST BIOPSY Right 2009   BREAST LUMPECTOMY Left 1998   CATARACT EXTRACTION W/ INTRAOCULAR LENS  IMPLANT, BILATERAL Bilateral    COLONOSCOPY  02/21/2011   COLONOSCOPY  10/06/2008   COLONOSCOPY  12/27/01   COLONOSCOPY  05/09/05   COLONOSCOPY N/A 07/02/2016   Procedure: COLONOSCOPY;  Surgeon: [name], MD;  Location[facility];  Service: Endoscopy;  Laterality: N/A;  1055   COLONOSCOPY N/A 07/04/2021   Procedure: COLONOSCOPY;  Surgeon:  [name], MD;  Location: [facility];  Service: Endoscopy;  Laterality: N/A;  7:30   [place]   "I was having rectal bleeding"   FLEXIBLE SIGMOIDOSCOPY N/A 04/14/2019   Procedure: FLEXIBLE SIGMOIDOSCOPY;  Surgeon: [name], MD;  Location: [facility];  Service: Endoscopy;  Laterality: N/A;  1:00   FRACTURE SURGERY     INTRAMEDULLARY (IM) NAIL INTERTROCHANTERIC Left 12/19/2021   Procedure: INTRAMEDULLARY (IM) NAIL INTERTROCHANTRIC;  Surgeon: Carole Civil, MD;  Location: [facility] ORS;  Service: Orthopedics;  Laterality: Left;   MASTECTOMY Right 2009   MASTECTOMY COMPLETE / SIMPLE Left 08/19/2018   POLYPECTOMY  07/04/2021   Procedure: POLYPECTOMY;  Surgeon: [name], MD;  Location: [facility];  Service: Endoscopy;;   PORTACATH PLACEMENT Right 10/15/2018   Procedure: INSERTION PORT-A-CATH RIGHT SUBCLAVIN;  Surgeon: [name], MD;  Location: [facility] ORS;  Service: General;  Laterality: Right;  pt knows to arrive at 7:30   [place] Left 08/19/2018   Procedure: LEFT SIMPLE MASTECTOMY;  Surgeon: [name], MD;  Location: [place];  Service: General;  Laterality: Left;   TONSILLECTOMY     VENA CAVA FILTER PLACEMENT Right    WRIST FRACTURE  SURGERY Right    Patient Active Problem List   Diagnosis Date Noted   Displaced intertrochanteric fracture of left femur, initial encounter for closed fracture ([place]) 12/17/2021   Constipation 04/02/2021   Rectal bleeding 04/05/2019   Ulcerative pancolitis without complication ([place]) 45/36/4680   Breast cancer, stage 1, left ([place]) 08/19/2018   Breast cancer of upper-inner quadrant of left female breast ([place]) 07/21/2018   Recurrent deep vein thrombosis (DVT) of right lower extremity ([place]) 03/08/2018   RBBB 03/08/2018   Hx of colonic polyps 03/14/2016   S/P IVC filter    Leukocytosis 07/12/2012   Hypotension 07/12/2012   Hyperglycemia 07/12/2012   Anemia 06/28/2012    Anemia due to blood loss, acute 06/09/2012   Hematochezia 06/09/2012   Warfarin-induced coagulopathy ([place]) 06/09/2012   Hyponatremia 06/07/2012   Dehydration 06/07/2012   UC (ulcerative colitis confined to rectum) ([place]) 11/05/2011   DVT (deep venous thrombosis) ([place]) 11/05/2011   REFERRING PROVIDER: Carole Civil, MD  REFERRING DIAG: Closed hip fracture, left, with routine healing  THERAPY DIAG:  Difficulty in walking, not elsewhere classified  Stiffness of left hip, not elsewhere classified  Rationale for Evaluation and Treatment Rehabilitation  ONSET DATE: Fracture: 12/17/21 Surgical repair: 12/19/21  SUBJECTIVE:   SUBJECTIVE STATEMENT: Patient reports that she has not had any pain recently.   NEXT MD FOLLOW UP:   03/10/2022  PERTINENT HISTORY: OA, osteoporosis, history of TIA history of breast cancer, neuropathy   PAIN:  Are you having pain? No  PRECAUTIONS: Fall    OBJECTIVE:   PATIENT SURVEYS:  FOTO 51.85  TODAY'S TREATMENT:                                   7/18 EXERCISE LOG  Exercise Repetitions and Resistance Comments  Nustep L3 x 15 minutes    Eccentric step down 6" step x 20 reps  With cueing for slow eccentric lower   Sit to stand From low mat table with foam pad under feet x 20 reps   Marching On BOSU (ball up) x 3 minutes    LAQ 5 lbs x 3 minutes   SLR 20 reps each with hip abduction    Bridge w/ march 20 reps     Blank cell = exercise not performed today                                    7/10 EXERCISE LOG  Exercise Repetitions and Resistance Comments  Nustep L2 x16 min   L forward step 6" x20 reps   L side step 6" step x20 reps   B heel/toe raises  X20 reps   B hip marching Standing; x20 reps   B hip abduction Standing; x20 reps   LAQ 3# BLE x20 reps   B hip marching Sitting 3# x20 reps   Sit/stand X10 reps no UE support   Hip clam Red; x20 reps    Blank cell = exercise not performed today    PATIENT EDUCATION:  Education  details: healing and walking Person educated: Patient Education method: Explanation Education comprehension: verbalized understanding   HOME EXERCISE PROGRAM: Reviewed home health HEP  ASSESSMENT:  CLINICAL IMPRESSION: Patient was progressed with multiple new and familiar interventions for improved lower extremity strength and stability needed for improved function descending stairs. She required minimal cueing eccentric  step downs for a slow and controlled eccentric quadriceps engagement. She reported no pain or discomfort with any of today's interventions. However, fatigue was her primary limiting factor with today's interventions. She reported feeling a little tired upon the conclusion of treatment. She continues to require skilled physical therapy to address her remaining impairments to return to her prior level of function.   OBJECTIVE IMPAIRMENTS Abnormal gait, decreased activity tolerance, decreased balance, decreased mobility, difficulty walking, decreased ROM, decreased strength, and hypomobility.   ACTIVITY LIMITATIONS carrying, lifting, stairs, and locomotion level  PARTICIPATION LIMITATIONS: meal prep, cleaning, laundry, and community activity  PERSONAL FACTORS 3+ comorbidities: HTN, history of breast cancer, OA, and osteoporosis  are also affecting patient's functional outcome.   REHAB POTENTIAL: Excellent  CLINICAL DECISION MAKING: Stable/uncomplicated   GOALS: Goals reviewed with patient? Yes  LONG TERM GOALS: Target date: 03/24/2022   Patient will be independent with her HEP.  Baseline:  Goal status: INITIAL  2.  Patient will be able to navigate at least 4 steps with a reciprocal pattern for improved function navigating her house.  Baseline:  Goal status: INITIAL  3.  Patient will be able to walk at least 15 minutes without an assistive device for improved community mobility.  Baseline:  Goal status: INITIAL  4.  Patient will be able to walk without an  assistive device without significant gait deviations for improved mobility.  Baseline:  Goal status: INITIAL  5.  Patient will be able to return to her community activities without being limited by her left hip.  Baseline:  Goal status: INITIAL   PLAN: PT FREQUENCY: 2x/week  PT DURATION: 4 weeks  PLANNED INTERVENTIONS: Therapeutic exercises, Therapeutic activity, Neuromuscular re-education, Balance training, Gait training, Patient/Family education, Joint mobilization, Electrical stimulation, Moist heat, Taping, Vasopneumatic device, Manual therapy, and Re-evaluation  PLAN FOR NEXT SESSION: nustep, step ups, and functional strengthening    Darlin Coco, PT 03/04/2022, 12:40 PM

## 2022-03-06 ENCOUNTER — Ambulatory Visit (HOSPITAL_COMMUNITY): Payer: Medicare PPO | Admitting: Hematology

## 2022-03-06 ENCOUNTER — Inpatient Hospital Stay (HOSPITAL_COMMUNITY): Payer: Medicare PPO | Admitting: Hematology

## 2022-03-06 VITALS — BP 152/75 | HR 85 | Temp 97.6°F | Resp 18 | Ht 62.0 in | Wt 168.7 lb

## 2022-03-06 DIAGNOSIS — Z170 Estrogen receptor positive status [ER+]: Secondary | ICD-10-CM | POA: Diagnosis not present

## 2022-03-06 DIAGNOSIS — C50212 Malignant neoplasm of upper-inner quadrant of left female breast: Secondary | ICD-10-CM

## 2022-03-06 NOTE — Progress Notes (Signed)
[facility] [address] [place] [address]   Patient Care Team: [name], NP as PCP - General (Internal Medicine)  SUMMARY OF ONCOLOGIC HISTORY: Oncology History  Breast cancer of upper-inner quadrant of left female breast ([place])  07/21/2018 Initial Diagnosis   Breast cancer of upper-inner quadrant of left female breast ([place])   10/22/2018 - 10/21/2019 Chemotherapy   Patient is on Treatment Plan : BREAST weekly PACLitaxel / trastuzumab / Maintenance trastuzumab every 21 days       CHIEF COMPLIANT: Follow-up for breast cancer   INTERVAL HISTORY: Ms. Quetzalli Clos is a 86 y.o. female here today for follow up of her breast cancer. Her last visit was on 07/31/2021.   Today she reports feeling good. She presented to the ED on 5/2 for a left hip fracture following a fall. She continues to take Eliquis and exemestane. She continues to receive Prolia injections.   REVIEW OF SYSTEMS:   Review of Systems  Constitutional:  Negative for appetite change and fatigue.  Psychiatric/Behavioral:  Positive for sleep disturbance.   All other systems reviewed and are negative.   I have reviewed the past medical history, past surgical history, social history and family history with the patient and they are unchanged from previous note.   ALLERGIES:   is allergic to mercaptopurine and sulfa antibiotics.   MEDICATIONS:  Current Outpatient Medications  Medication Sig Dispense Refill   bisacodyl (DULCOLAX) 10 MG suppository Place 1 suppository (10 mg total) rectally daily as needed for severe constipation. 12 suppository 0   Calcium Carb-Cholecalciferol (CALCIUM 600 + D PO) Take 1 tablet by mouth daily.     clobetasol (TEMOVATE) 0.05 % external solution Apply 1 application topically daily as needed (psoriasis).     denosumab (PROLIA) 60 MG/ML SOLN injection Inject 60 mg into the skin every 6 (six) months. Administer in upper arm, thigh, or abdomen     ELIQUIS 2.5  MG TABS tablet Take 1 tablet (2.5 mg total) by mouth 2 (two) times daily. 60 tablet    exemestane (AROMASIN) 25 MG tablet TAKE 1 TABLET DAILY AFTER BREAKFAST. 30 tablet 6   FARXIGA 10 MG TABS tablet Take 10 mg by mouth daily.     fluticasone (CUTIVATE) 0.05 % cream SMARTSIG:1 Topical Daily     HUMIRA PEN 40 MG/0.8ML PNKT INJECT 40 MG EVERY 14 DAYS AS DIRECTED. (Patient taking differently: Inject 40 mg into the skin every 14 (fourteen) days.) 2 each 11   ketoconazole (NIZORAL) 2 % cream SMARTSIG:1 Topical Daily     Magnesium 250 MG TABS Take 250 mg by mouth daily.     OVER THE COUNTER MEDICATION Take 2-4 tablets by mouth 3 (three) times a week. Healthy Feet and Nerves otc supplement     polyethylene glycol (MIRALAX / GLYCOLAX) 17 g packet Take 17 g by mouth 2 (two) times daily. 14 each 0   PRESCRIPTION MEDICATION Apply 1 application topically daily as needed (irritation). Fluticasone 0.05 % and Ketoconazole 2% 1:1     No current facility-administered medications for this visit.   Facility-Administered Medications Ordered in Other Visits  Medication Dose Route Frequency Provider Last Rate Last Admin   sodium chloride flush (NS) 0.9 % injection 10 mL  10 mL Intravenous PRN Derek Jack, MD   10 mL at 07/24/21 1429     PHYSICAL EXAMINATION: Performance status (ECOG): 1 - Symptomatic but completely ambulatory  Vitals:   03/06/22 1430  BP: (!) 152/75  Pulse: 85  Resp: 18  Temp: 97.6 F (36.4 C)  SpO2: 95%   Wt Readings from Last 3 Encounters:  03/06/22 168 lb 11.2 oz (76.5 kg)  12/17/21 169 lb 12.1 oz (77 kg)  10/08/21 170 lb 9.6 oz (77.4 kg)   Physical Exam Vitals reviewed.  Constitutional:      Appearance: Normal appearance.  Cardiovascular:     Rate and Rhythm: Normal rate and regular rhythm.     Pulses: Normal pulses.     Heart sounds: Normal heart sounds.  Pulmonary:     Effort: Pulmonary effort is normal.     Breath sounds: Normal breath sounds.  Chest:   Breasts:    Right: Absent. No swelling, bleeding, mass, skin change (mastectomy site WNL) or tenderness.     Left: Absent. No swelling, bleeding, mass, skin change (mastectomy site WNL) or tenderness.  Lymphadenopathy:     Upper Body:     Right upper body: No supraclavicular or axillary adenopathy.     Left upper body: No supraclavicular or axillary adenopathy.  Neurological:     General: No focal deficit present.     Mental Status: She is alert and oriented to person, place, and time.  Psychiatric:        Mood and Affect: Mood normal.        Behavior: Behavior normal.     Breast Exam Chaperone: Thana Ates     LABORATORY DATA:  I have reviewed the data as listed    Latest Ref Rng & Units 02/27/2022    2:08 PM 12/21/2021    6:21 AM 12/18/2021    2:33 PM  CMP  Glucose 70 - 99 mg/dL 146  96  109   BUN 8 - 23 mg/dL 22  31  21    Creatinine 0.44 - 1.00 mg/dL 1.25  0.90  1.09   Sodium 135 - 145 mmol/L 136  134  136   Potassium 3.5 - 5.1 mmol/L 3.3  4.1  3.9   Chloride 98 - 111 mmol/L 103  103  105   CO2 22 - 32 mmol/L 26  27  25    Calcium 8.9 - 10.3 mg/dL 9.0  7.8  8.5   Total Protein 6.5 - 8.1 g/dL 7.1   7.0   Total Bilirubin 0.3 - 1.2 mg/dL 0.5   0.5   Alkaline Phos 38 - 126 U/L 77   24   AST 15 - 41 U/L 20   17   ALT 0 - 44 U/L 17   16    No results found for: "CAN153" Lab Results  Component Value Date   WBC 7.4 02/27/2022   HGB 14.2 02/27/2022   HCT 42.5 02/27/2022   MCV 96.4 02/27/2022   PLT 186 02/27/2022   NEUTROABS 4.5 02/27/2022    ASSESSMENT:  1.  Stage I HER-2 positive left breast cancer: -Left mastectomy on 08/19/2018, 1.1 cm IDC, grade 1, margins negative.  No lymph nodes were identified.  ER positive/PR positive/HER 2 3+. -12 doses of weekly paclitaxel and Herceptin from 10/22/2018 through 01/14/2019. -Herceptin every 3 weeks started on 02/03/2019 her last dose was on 10/21/2019. -Anastrozole started in December 2019, made her confused and forgetful. -Switched  to Femara on 02/03/2019, which is causing vaginal irritation that has worsened over the past 6 months. - She was switched to exemestane.   2.  Osteopenia: -Last DEXA scan on 02/08/2018 showed a T score of -1.2. -She is on Prolia every 6 months  with Dr. Melina Copa.     3.  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 -She denies any bleeding episodes.   4.  Ulcerative colitis: -She is on Humira for the past few years.  She is off of mesalamine. -She is following up with Dr. Laural Golden   PLAN:  1.  Stage I HER-2 positive left breast cancer: - Bilateral mastectomy sites are within normal limits today. - She is tolerating exemestane very well. - She fell and broke her left hip and had IM nailing for left intertrochanteric fracture on 12/19/2021.  She is recovering well. - Reviewed labs today which showed normal LFTs and CBC. - Recommend follow-up in 6 months with repeat labs and exam.     2.  Osteopenia: - DEXA scan on 12/19/2020 with T score -1.3. - Continue Prolia injections with her PMD. - Continue calcium and vitamin D.  Vitamin D is 29.59.     3.  Recurrent DVTs: - Continue Eliquis.  No bleeding issues.  Breast Cancer therapy associated bone loss: I have recommended calcium, Vitamin D and weight bearing exercises.  Orders placed this encounter:  No orders of the defined types were placed in this encounter.   The patient has a good understanding of the overall plan. She agrees with it. She will call with any problems that may develop before the next visit here.  Derek Jack, MD [place] [phone]   I, Thana Ates, am acting as a scribe for Dr. Derek Jack.  I, Derek Jack MD, have reviewed the above documentation for accuracy and completeness, and I agree with the above.

## 2022-03-06 NOTE — Patient Instructions (Addendum)
[place] at [facility] Discharge Instructions  You were seen and examined today by Dr. Delton Coombes.  Dr. Delton Coombes discussed your most recent lab work and everything looks good. Continue taking Exemastane  Follow-up as scheduled in 6 months.    Thank you for choosing Center at [facility] to provide your oncology and hematology care.  To afford each patient quality time with our provider, please arrive at least 15 minutes before your scheduled appointment time.   If you have a lab appointment with the [facility] please come in thru the Main Entrance and check in at the main information desk.  You need to re-schedule your appointment should you arrive 10 or more minutes late.  We strive to give you quality time with our providers, and arriving late affects you and other patients whose appointments are after yours.  Also, if you no show three or more times for appointments you may be dismissed from the clinic at the providers discretion.     Again, thank you for choosing [facility].  Our hope is that these requests will decrease the amount of time that you wait before being seen by our physicians.       _____________________________________________________________  Should you have questions after your visit to [facility], please contact our office at [phone] and follow the prompts.  Our office hours are 8:00 a.m. and 4:30 p.m. Monday - Friday.  Please note that voicemails left after 4:00 p.m. may not be returned until the following business day.  We are closed weekends and major holidays.  You do have access to a nurse 24-7, just call the main number to the clinic [phone] and do not press any options, hold on the line and a nurse will answer the phone.    For prescription refill requests, have your pharmacy contact our office and allow 72 hours.    Due to Covid, you will need to wear a mask upon  entering the hospital. If you do not have a mask, a mask will be given to you at the Main Entrance upon arrival. For doctor visits, patients may have 1 support person age 56 or older with them. For treatment visits, patients can not have anyone with them due to social distancing guidelines and our immunocompromised population.

## 2022-03-07 ENCOUNTER — Ambulatory Visit: Payer: Medicare PPO

## 2022-03-07 DIAGNOSIS — M25652 Stiffness of left hip, not elsewhere classified: Secondary | ICD-10-CM

## 2022-03-07 DIAGNOSIS — R262 Difficulty in walking, not elsewhere classified: Secondary | ICD-10-CM | POA: Diagnosis not present

## 2022-03-07 NOTE — Therapy (Signed)
OUTPATIENT PHYSICAL THERAPY LOWER EXTREMITY TREATMENT   Patient Name: Monique Day MRN: [id number] DOB:Feb 01, 1936, 86 y.o., female Today's Date: 03/07/2022   PT End of Session - 03/07/22 1036     Visit Number 4    Number of Visits 8    Date for PT Re-Evaluation 04/11/22    PT Start Time 1030    PT Stop Time 1115    PT Time Calculation (min) 45 min    Activity Tolerance Patient tolerated treatment well    Behavior During Therapy WFL for tasks assessed/performed               Past Medical History:  Diagnosis Date   Adult idiopathic generalized osteoporosis    Arthritis    "minor" (08/18/2018)   Breast cancer, left breast ([place]) 1998   lumpectomy   Breast cancer, right breast ([place]) 2009   lumpectomy; mastectomy   DVT (deep venous thrombosis) ([place]) "early 2000's"   RLE   DVT (deep venous thrombosis) ([place])    Hemorrhoids, external    History of blood transfusion    "related to ulcerative colitis"   Lower abdominal pain    Occult blood in stools    Peripheral edema    Seizures ([place])    one very slight seizure with the stroke; somewhere between 2005-2009   Stroke [facility]) 2005-2009   "very light;" ; denies residual on 08/19/2018   UC (ulcerative colitis) ([place]) dx'd 4401   Past Surgical History:  Procedure Laterality Date   APPENDECTOMY     BIOPSY  04/14/2019   Procedure: BIOPSY;  Surgeon: [name], MD;  [facility];  Service: Endoscopy;;   bone density  12/11/10   BREAST BIOPSY Left 1998; 2019 X 2   BREAST BIOPSY Right 2009   BREAST LUMPECTOMY Left 1998   CATARACT EXTRACTION W/ INTRAOCULAR LENS  IMPLANT, BILATERAL Bilateral    COLONOSCOPY  02/21/2011   COLONOSCOPY  10/06/2008   COLONOSCOPY  12/27/01   COLONOSCOPY  05/09/05   COLONOSCOPY N/A 07/02/2016   Procedure: COLONOSCOPY;  Surgeon: [name], MD;  Location: [facility];  Service: Endoscopy;  Laterality: N/A;  1055   COLONOSCOPY N/A 07/04/2021   Procedure: COLONOSCOPY;  Surgeon:  [name], MD;  Location[facility];  Service: Endoscopy;  Laterality: N/A;  7:30   [place]   "I was having rectal bleeding"   FLEXIBLE SIGMOIDOSCOPY N/A 04/14/2019   Procedure: FLEXIBLE SIGMOIDOSCOPY;  Surgeon: [name], MD;  [facility];  Service: Endoscopy;  Laterality: N/A;  1:00   FRACTURE SURGERY     INTRAMEDULLARY (IM) NAIL INTERTROCHANTERIC Left 12/19/2021   Procedure: INTRAMEDULLARY (IM) NAIL INTERTROCHANTRIC;  Surgeon: Carole Civil, MD;  Location: [facility] ORS;  Service: Orthopedics;  Laterality: Left;   MASTECTOMY Right 2009   MASTECTOMY COMPLETE / SIMPLE Left 08/19/2018   POLYPECTOMY  07/04/2021   Procedure: POLYPECTOMY;  Surgeon: [name], MD;  [facility];  Service: Endoscopy;;   PORTACATH PLACEMENT Right 10/15/2018   Procedure: INSERTION PORT-A-CATH RIGHT SUBCLAVIN;  Surgeon: [name], MD;  Location: [facility] OR[facility];  Service: General;  Laterality: Right;  pt knows to arrive at 7:30   [place] Left 08/19/2018   Procedure: LEFT SIMPLE MASTECTOMY;  Surgeon: [name], MD;  Location: [place];  Service: General;  Laterality: Left;   TONSILLECTOMY     VENA CAVA FILTER PLACEMENT Right    WRIST  FRACTURE SURGERY Right    Patient Active Problem List   Diagnosis Date Noted   Displaced intertrochanteric fracture of left femur, initial encounter for closed fracture ([place]) 12/17/2021   Constipation 04/02/2021   Rectal bleeding 04/05/2019   Ulcerative pancolitis without complication ([place]) 22/63/3354   Breast cancer, stage 1, left ([place]) 08/19/2018   Breast cancer of upper-inner quadrant of left female breast ([place]) 07/21/2018   Recurrent deep vein thrombosis (DVT) of right lower extremity ([place]) 03/08/2018   RBBB 03/08/2018   Hx of colonic polyps 03/14/2016   S/P IVC filter    Leukocytosis 07/12/2012   Hypotension 07/12/2012   Hyperglycemia 07/12/2012   Anemia 06/28/2012    Anemia due to blood loss, acute 06/09/2012   Hematochezia 06/09/2012   Warfarin-induced coagulopathy ([place]) 06/09/2012   Hyponatremia 06/07/2012   Dehydration 06/07/2012   UC (ulcerative colitis confined to rectum) ([place]) 11/05/2011   DVT (deep venous thrombosis) ([place]) 11/05/2011   REFERRING PROVIDER: Carole Civil, MD  REFERRING DIAG: Closed hip fracture, left, with routine healing  THERAPY DIAG:  Difficulty in walking, not elsewhere classified  Stiffness of left hip, not elsewhere classified  Rationale for Evaluation and Treatment Rehabilitation  ONSET DATE: Fracture: 12/17/21 Surgical repair: 12/19/21  SUBJECTIVE:   SUBJECTIVE STATEMENT: Patient reports that her hip feels fine, but her knees are still a little stiff.    NEXT MD FOLLOW UP:   03/10/2022  PERTINENT HISTORY: OA, osteoporosis, history of TIA history of breast cancer, neuropathy   PAIN:  Are you having pain? No  PRECAUTIONS: Fall    OBJECTIVE:   PATIENT SURVEYS:  FOTO 51.85  TODAY'S TREATMENT:                                   7/21 EXERCISE LOG  Exercise Repetitions and Resistance Comments  Nustep L2-4 x 15 minutes   Semi tandem on foam  4 x 30 seconds each    Lunge  Onto 6" step; 20 reps each    Marching  On BOSU x 3 minutes   Squats 25 reps  Cueing for knee stability   LAQ 5 lbs x 30 reps each   Cybex knee flexion 40# 3 x 10 reps   Standing heel / toe raises 20 reps   Donkey kicks  20 reps each; standing     Blank cell = exercise not performed today                                    7/18 EXERCISE LOG  Exercise Repetitions and Resistance Comments  Nustep L3 x 15 minutes    Eccentric step down 6" step x 20 reps  With cueing for slow eccentric lower   Sit to stand From low mat table with foam pad under feet x 20 reps   Marching On BOSU (ball up) x 3 minutes    LAQ 5 lbs x 3 minutes   SLR 20 reps each with hip abduction    Bridge w/ march 20 reps     Blank cell = exercise not performed  today                                    7/10 EXERCISE LOG  Exercise Repetitions and Resistance Comments  Nustep L2 x16 min   L forward step 6" x20 reps   L side step 6" step x20 reps   B heel/toe raises  X20 reps   B hip marching Standing; x20 reps   B hip abduction Standing; x20 reps   LAQ 3# BLE x20 reps   B hip marching Sitting 3# x20 reps   Sit/stand X10 reps no UE support   Hip clam Red; x20 reps    Blank cell = exercise not performed today    PATIENT EDUCATION:  Education details: healing and walking Person educated: Patient Education method: Explanation Education comprehension: verbalized understanding   HOME EXERCISE PROGRAM: Reviewed home health HEP  ASSESSMENT:  CLINICAL IMPRESSION: Patient was progressed with multiple new and familiar interventions for improved lower extremity strength and stability. She required minimal cueing with squats to facilitate improved knee stability to prevent a valgus collapse. She experienced no pain or discomfort with any of today's interventions. She reported feeling alright upon the conclusion of treatment. She continues to require skilled physical therapy to address her remaining impairments to return to her prior level of function.    OBJECTIVE IMPAIRMENTS Abnormal gait, decreased activity tolerance, decreased balance, decreased mobility, difficulty walking, decreased ROM, decreased strength, and hypomobility.   ACTIVITY LIMITATIONS carrying, lifting, stairs, and locomotion level  PARTICIPATION LIMITATIONS: meal prep, cleaning, laundry, and community activity  PERSONAL FACTORS 3+ comorbidities: HTN, history of breast cancer, OA, and osteoporosis  are also affecting patient's functional outcome.   REHAB POTENTIAL: Excellent  CLINICAL DECISION MAKING: Stable/uncomplicated   GOALS: Goals reviewed with patient? Yes  LONG TERM GOALS: Target date: 03/24/2022   Patient will be independent with her HEP.  Baseline:  Goal  status: INITIAL  2.  Patient will be able to navigate at least 4 steps with a reciprocal pattern for improved function navigating her house.  Baseline:  Goal status: INITIAL  3.  Patient will be able to walk at least 15 minutes without an assistive device for improved community mobility.  Baseline:  Goal status: INITIAL  4.  Patient will be able to walk without an assistive device without significant gait deviations for improved mobility.  Baseline:  Goal status: INITIAL  5.  Patient will be able to return to her community activities without being limited by her left hip.  Baseline:  Goal status: INITIAL   PLAN: PT FREQUENCY: 2x/week  PT DURATION: 4 weeks  PLANNED INTERVENTIONS: Therapeutic exercises, Therapeutic activity, Neuromuscular re-education, Balance training, Gait training, Patient/Family education, Joint mobilization, Electrical stimulation, Moist heat, Taping, Vasopneumatic device, Manual therapy, and Re-evaluation  PLAN FOR NEXT SESSION: nustep, step ups, and functional strengthening    Darlin Coco, PT 03/07/2022, 12:21 PM

## 2022-03-10 ENCOUNTER — Ambulatory Visit (INDEPENDENT_AMBULATORY_CARE_PROVIDER_SITE_OTHER): Payer: Medicare PPO

## 2022-03-10 ENCOUNTER — Ambulatory Visit (INDEPENDENT_AMBULATORY_CARE_PROVIDER_SITE_OTHER): Payer: Medicare PPO | Admitting: Orthopedic Surgery

## 2022-03-10 DIAGNOSIS — S72002D Fracture of unspecified part of neck of left femur, subsequent encounter for closed fracture with routine healing: Secondary | ICD-10-CM | POA: Diagnosis not present

## 2022-03-10 NOTE — Progress Notes (Signed)
FOLLOW UP   Encounter Diagnosis  Name Primary?   Closed hip fracture, left, with routine healing, subsequent encounter Yes     Chief Complaint  Patient presents with   Post-op Follow-up    Left hip fracture  DOS 12/19/21    Approximately 10 to 11 weeks status post intertrochanteric fracture fixation with a long Arthrex nail patient continues to have no pain has excellent motion in her hip she is using a cane which she is encouraged to continue  Follow-up in 3 months  Based on extravasated fracture has healed

## 2022-03-11 ENCOUNTER — Encounter: Payer: Self-pay | Admitting: Physical Therapy

## 2022-03-11 ENCOUNTER — Ambulatory Visit: Payer: Medicare PPO | Admitting: Physical Therapy

## 2022-03-11 DIAGNOSIS — M25652 Stiffness of left hip, not elsewhere classified: Secondary | ICD-10-CM

## 2022-03-11 DIAGNOSIS — R262 Difficulty in walking, not elsewhere classified: Secondary | ICD-10-CM

## 2022-03-11 NOTE — Therapy (Signed)
OUTPATIENT PHYSICAL THERAPY LOWER EXTREMITY TREATMENT   Patient Name: Monique Day MRN: [id number] DOB:[birth year], 86 y.o., female Today's Date: 03/11/2022   PT End of Session - 03/11/22 1344     Visit Number 5    Number of Visits 8    Date for PT Re-Evaluation 04/11/22    PT Start Time 1346    PT Stop Time 1430    PT Time Calculation (min) 44 min    Activity Tolerance Patient tolerated treatment well    Behavior During Therapy WFL for tasks assessed/performed               Past Medical History:  Diagnosis Date   Adult idiopathic generalized osteoporosis    Arthritis    "minor" (08/18/2018)   Breast cancer, left breast ([place]) 1998   lumpectomy   Breast cancer, right breast ([place]) 2009   lumpectomy; mastectomy   DVT (deep venous thrombosis) ([place]) "early 2000's"   RLE   DVT (deep venous thrombosis) ([place])    Hemorrhoids, external    History of blood transfusion    "related to ulcerative colitis"   Lower abdominal pain    Occult blood in stools    Peripheral edema    Seizures ([place])    one very slight seizure with the stroke; somewhere between 2005-2009   Stroke [facility]) 2005-2009   "very light;" ; denies residual on 08/19/2018   UC (ulcerative colitis) ([place]) dx'd 5427   Past Surgical History:  Procedure Laterality Date   APPENDECTOMY     BIOPSY  04/14/2019   Procedure: BIOPSY;  Surgeon: [name], MD;  [facility]: [facility];  Service: Endoscopy;;   bone density  12/11/10   BREAST BIOPSY Left 1998; 2019 X 2   BREAST BIOPSY Right 2009   BREAST LUMPECTOMY Left 1998   CATARACT EXTRACTION W/ INTRAOCULAR LENS  IMPLANT, BILATERAL Bilateral    COLONOSCOPY  02/21/2011   COLONOSCOPY  10/06/2008   COLONOSCOPY  12/27/01   COLONOSCOPY  05/09/05   COLONOSCOPY N/A 07/02/2016   Procedure: COLONOSCOPY;  Surgeon: [name], MD;  Location: [facility];  Service: Endoscopy;  Laterality: N/A;  1055   COLONOSCOPY N/A 07/04/2021   Procedure: COLONOSCOPY;  Surgeon:  [name], MD;  Location: [facility];  Service: Endoscopy;  Laterality: N/A;  7:30   [place]   "I was having rectal bleeding"   FLEXIBLE SIGMOIDOSCOPY N/A 04/14/2019   Procedure: FLEXIBLE SIGMOIDOSCOPY;  Surgeon: [name], MD;  Location: [facility];  Service: Endoscopy;  Laterality: N/A;  1:00   FRACTURE SURGERY     INTRAMEDULLARY (IM) NAIL INTERTROCHANTERIC Left 12/19/2021   Procedure: INTRAMEDULLARY (IM) NAIL INTERTROCHANTRIC;  Surgeon: Carole Civil, MD;  Location: [facility];  Service: Orthopedics;  Laterality: Left;   MASTECTOMY Right 2009   MASTECTOMY COMPLETE / SIMPLE Left 08/19/2018   POLYPECTOMY  07/04/2021   Procedure: POLYPECTOMY;  Surgeon: [name], MD;  Location: [facility];  Service: Endoscopy;;   PORTACATH PLACEMENT Right 10/15/2018   Procedure: INSERTION PORT-A-CATH RIGHT SUBCLAVIN;  Surgeon: [name], MD;  Location: [facility];  Service: General;  Laterality: Right;  pt knows to arrive at 7:30   [place] Left 08/19/2018   Procedure: LEFT SIMPLE MASTECTOMY;  Surgeon: [name], MD;  Location: [place];  Service: General;  Laterality: Left;   TONSILLECTOMY     VENA CAVA FILTER PLACEMENT Right    WRIST  FRACTURE SURGERY Right    Patient Active Problem List   Diagnosis Date Noted   Displaced intertrochanteric fracture of left femur, initial encounter for closed fracture ([place]) 12/17/2021   Constipation 04/02/2021   Rectal bleeding 04/05/2019   Ulcerative pancolitis without complication ([place]) 22/48/2500   Breast cancer, stage 1, left ([place]) 08/19/2018   Breast cancer of upper-inner quadrant of left female breast ([place]) 07/21/2018   Recurrent deep vein thrombosis (DVT) of right lower extremity ([place]) 03/08/2018   RBBB 03/08/2018   Hx of colonic polyps 03/14/2016   S/P IVC filter    Leukocytosis 07/12/2012   Hypotension 07/12/2012   Hyperglycemia 07/12/2012   Anemia 06/28/2012    Anemia due to blood loss, acute 06/09/2012   Hematochezia 06/09/2012   Warfarin-induced coagulopathy ([place]) 06/09/2012   Hyponatremia 06/07/2012   Dehydration 06/07/2012   UC (ulcerative colitis confined to rectum) ([place]) 11/05/2011   DVT (deep venous thrombosis) ([place]) 11/05/2011   REFERRING PROVIDER: Carole Civil, MD  REFERRING DIAG: Closed hip fracture, left, with routine healing  THERAPY DIAG:  Difficulty in walking, not elsewhere classified  Stiffness of left hip, not elsewhere classified  Rationale for Evaluation and Treatment Rehabilitation  ONSET DATE: Fracture: 12/17/21 Surgical repair: 12/19/21  SUBJECTIVE:   SUBJECTIVE STATEMENT: Reports that she got a good report from her surgeon.  PAIN:  Are you having pain? No   NEXT MD FOLLOW UP:   06/09/2022  PERTINENT HISTORY: OA, osteoporosis, history of TIA history of breast cancer, neuropathy   PAIN:  Are you having pain? No  PRECAUTIONS: Fall  OBJECTIVE:   PATIENT SURVEYS:  FOTO 51.85  TODAY'S TREATMENT:                                   7/25 EXERCISE LOG  Exercise Repetitions and Resistance Comments  Nustep L4 x 15 minutes   LAQ 5 lbs x 30 reps each   Standing heel / toe raises 20 reps   Donkey kicks  20 reps each; standing red theraband   Forward step LLE x20 reps   Sidestepping Red theraband x5 RT   Tandem on beam X2 min with intermitt UE support   DLS on beam X3 min with cognitive challenge    Blank cell = exercise not performed today   PATIENT EDUCATION:  Education details: healing and walking Person educated: Patient Education method: Explanation Education comprehension: verbalized understanding   HOME EXERCISE PROGRAM: Reviewed home health HEP  ASSESSMENT:  CLINICAL IMPRESSION: Patient presented in clinic with reports of a good visit with orthopedic surgeon. Patient reports not using SPC as much at home. Patient has ordered a Life Alert as her washing machine is in her basement and  she has not gone into her basement yet. Patient progressed through strengthening and smaller BOS balance activities. Patient able to tolerate treatment well with only report of muscle fatigue.   OBJECTIVE IMPAIRMENTS Abnormal gait, decreased activity tolerance, decreased balance, decreased mobility, difficulty walking, decreased ROM, decreased strength, and hypomobility.   ACTIVITY LIMITATIONS carrying, lifting, stairs, and locomotion level  PARTICIPATION LIMITATIONS: meal prep, cleaning, laundry, and community activity  PERSONAL FACTORS 3+ comorbidities: HTN, history of breast cancer, OA, and osteoporosis  are also affecting patient's functional outcome.   REHAB POTENTIAL: Excellent  CLINICAL DECISION MAKING: Stable/uncomplicated   GOALS: Goals reviewed with patient? Yes  LONG TERM GOALS: Target date: 03/24/2022   Patient will be independent with her  HEP.  Baseline:  Goal status: On-going  2.  Patient will be able to navigate at least 4 steps with a reciprocal pattern for improved function navigating her house.  Baseline:  Goal status: On-going  3.  Patient will be able to walk at least 15 minutes without an assistive device for improved community mobility.  Baseline:  Goal status: On-going  4.  Patient will be able to walk without an assistive device without significant gait deviations for improved mobility.  Baseline:  Goal status: On-going  5.  Patient will be able to return to her community activities without being limited by her left hip.  Baseline:  Goal status: On-going   PLAN: PT FREQUENCY: 2x/week  PT DURATION: 4 weeks  PLANNED INTERVENTIONS: Therapeutic exercises, Therapeutic activity, Neuromuscular re-education, Balance training, Gait training, Patient/Family education, Joint mobilization, Electrical stimulation, Moist heat, Taping, Vasopneumatic device, Manual therapy, and Re-evaluation  PLAN FOR NEXT SESSION: nustep, step ups, and functional strengthening     Standley Brooking, PTA 03/11/2022, 2:34 PM

## 2022-03-13 ENCOUNTER — Other Ambulatory Visit (HOSPITAL_COMMUNITY): Payer: Medicare PPO

## 2022-03-14 ENCOUNTER — Encounter: Payer: Self-pay | Admitting: *Deleted

## 2022-03-14 ENCOUNTER — Ambulatory Visit: Payer: Medicare PPO | Admitting: *Deleted

## 2022-03-14 DIAGNOSIS — R262 Difficulty in walking, not elsewhere classified: Secondary | ICD-10-CM

## 2022-03-14 DIAGNOSIS — M25652 Stiffness of left hip, not elsewhere classified: Secondary | ICD-10-CM

## 2022-03-14 NOTE — Therapy (Signed)
OUTPATIENT PHYSICAL THERAPY LOWER EXTREMITY TREATMENT   Patient Name: Monique Day MRN: [id number] DOB:[birth year], 86 y.o., female Today's Date: 03/14/2022   PT End of Session - 03/14/22 0949     Visit Number 6    Number of Visits 8    Date for PT Re-Evaluation 04/11/22    PT Start Time 0945    PT Stop Time 1761    PT Time Calculation (min) 47 min               Past Medical History:  Diagnosis Date   Adult idiopathic generalized osteoporosis    Arthritis    "minor" (08/18/2018)   Breast cancer, left breast ([place]) 1998   lumpectomy   Breast cancer, right breast ([place]) 2009   lumpectomy; mastectomy   DVT (deep venous thrombosis) ([place]) "early 2000's"   RLE   DVT (deep venous thrombosis) ([place])    Hemorrhoids, external    History of blood transfusion    "related to ulcerative colitis"   Lower abdominal pain    Occult blood in stools    Peripheral edema    Seizures ([place])    one very slight seizure with the stroke; somewhere between 2005-2009   Stroke [facility]) 2005-2009   "very light;" ; denies residual on 08/19/2018   UC (ulcerative colitis) ([place]) dx'd 6073   Past Surgical History:  Procedure Laterality Date   APPENDECTOMY     BIOPSY  04/14/2019   Procedure: BIOPSY;  Surgeon: [name], MD;  [facility];  Service: Endoscopy;;   bone density  12/11/10   BREAST BIOPSY Left 1998; 2019 X 2   BREAST BIOPSY Right 2009   BREAST LUMPECTOMY Left 1998   CATARACT EXTRACTION W/ INTRAOCULAR LENS  IMPLANT, BILATERAL Bilateral    COLONOSCOPY  02/21/2011   COLONOSCOPY  10/06/2008   COLONOSCOPY  12/27/01   COLONOSCOPY  05/09/05   COLONOSCOPY N/A 07/02/2016   Procedure: COLONOSCOPY;  Surgeon: [name], MD;  [facility];  Service: Endoscopy;  Laterality: N/A;  1055   COLONOSCOPY N/A 07/04/2021   Procedure: COLONOSCOPY;  Surgeon: [name], MD;  [facility];  Service: Endoscopy;  Laterality: N/A;  7:30   [place]   "I was having rectal bleeding"   FLEXIBLE SIGMOIDOSCOPY N/A 04/14/2019   Procedure: FLEXIBLE SIGMOIDOSCOPY;  Surgeon: [name], MD;  [facility];  Service: Endoscopy;  Laterality: N/A;  1:00   FRACTURE SURGERY     INTRAMEDULLARY (IM) NAIL INTERTROCHANTERIC Left 12/19/2021   Procedure: INTRAMEDULLARY (IM) NAIL INTERTROCHANTRIC;  Surgeon: Carole Civil, MD;  Location: [facility];  Service: Orthopedics;  Laterality: Left;   MASTECTOMY Right 2009   MASTECTOMY COMPLETE / SIMPLE Left 08/19/2018   POLYPECTOMY  07/04/2021   Procedure: POLYPECTOMY;  Surgeon: [name], MD;  [facility];  Service: Endoscopy;;   PORTACATH PLACEMENT Right 10/15/2018   Procedure: INSERTION PORT-A-CATH RIGHT SUBCLAVIN;  Surgeon: [name], MD;  Location: [facility];  Service: General;  Laterality: Right;  pt knows to arrive at 7:30   [place] Left 08/19/2018   Procedure: LEFT SIMPLE MASTECTOMY;  Surgeon: [name], MD;  Location: [place];  Service: General;  Laterality: Left;   TONSILLECTOMY     VENA CAVA FILTER PLACEMENT Right    WRIST FRACTURE SURGERY Right    Patient Active Problem List   Diagnosis Date Noted   Displaced intertrochanteric  fracture of left femur, initial encounter for closed fracture ([place]) 12/17/2021   Constipation 04/02/2021   Rectal bleeding 04/05/2019   Ulcerative pancolitis without complication ([place]) 47/42/5956   Breast cancer, stage 1, left ([place]) 08/19/2018   Breast cancer of upper-inner quadrant of left female breast ([place]) 07/21/2018   Recurrent deep vein thrombosis (DVT) of right lower extremity ([place]) 03/08/2018   RBBB 03/08/2018   Hx of colonic polyps 03/14/2016   S/P IVC filter    Leukocytosis 07/12/2012   Hypotension 07/12/2012   Hyperglycemia 07/12/2012   Anemia 06/28/2012   Anemia due to blood loss, acute 06/09/2012   Hematochezia 06/09/2012   Warfarin-induced coagulopathy ([place])  06/09/2012   Hyponatremia 06/07/2012   Dehydration 06/07/2012   UC (ulcerative colitis confined to rectum) ([place]) 11/05/2011   DVT (deep venous thrombosis) ([place]) 11/05/2011   REFERRING PROVIDER: Carole Civil, MD  REFERRING DIAG: Closed hip fracture, left, with routine healing  THERAPY DIAG:  Difficulty in walking, not elsewhere classified  Stiffness of left hip, not elsewhere classified  Rationale for Evaluation and Treatment Rehabilitation  ONSET DATE: Fracture: 12/17/21 Surgical repair: 12/19/21  SUBJECTIVE:   SUBJECTIVE STATEMENT: Doing good today. No hip pain  PAIN:  Are you having pain? No   NEXT MD FOLLOW UP:   06/09/2022  PERTINENT HISTORY: OA, osteoporosis, history of TIA history of breast cancer, neuropathy   PAIN:  Are you having pain? No  PRECAUTIONS: Fall  OBJECTIVE:   PATIENT SURVEYS:  FOTO 51.85           visit 1  TODAY'S TREATMENT:                                   7/28 EXERCISE LOG  Exercise Repetitions and Resistance Comments  Nustep L4 x 15 minutes   LAQ 5 lbs x 30 reps each LE   Standing heel / toe raises 20 reps   Donkey kicks  20 reps each; standing red theraband   Forward step LLE x20 reps   Sidestepping    Tandem on beam X2 min with intermitt UE support   DLS on beam X3 min with cognitive challenge   Marching 2x20    Ball squeeze X10 hold 10 secs    Blank cell = exercise not performed today   PATIENT EDUCATION:  Education details: healing and walking Person educated: Patient Education method: Explanation Education comprehension: verbalized understanding   HOME EXERCISE PROGRAM:   ASSESSMENT:  CLINICAL IMPRESSION:    Pt arrived today doing fairly well and was able to continue with LE strengthening and balance act.'s. She was able to meet 2 LTG's today.       OBJECTIVE IMPAIRMENTS Abnormal gait, decreased activity tolerance, decreased balance, decreased mobility, difficulty walking, decreased ROM, decreased  strength, and hypomobility.   ACTIVITY LIMITATIONS carrying, lifting, stairs, and locomotion level  PARTICIPATION LIMITATIONS: meal prep, cleaning, laundry, and community activity  PERSONAL FACTORS 3+ comorbidities: HTN, history of breast cancer, OA, and osteoporosis  are also affecting patient's functional outcome.   REHAB POTENTIAL: Excellent  CLINICAL DECISION MAKING: Stable/uncomplicated   GOALS: Goals reviewed with patient? Yes  LONG TERM GOALS: Target date: 03/24/2022   Patient will be independent with her HEP.  Baseline:  Goal status: On-going  2.  Patient will be able to navigate at least 4 steps with a reciprocal pattern for improved function navigating her house.  Baseline:  Goal status: MET  3.  Patient will be able to walk at least 15 minutes without an assistive device for improved community mobility.  Baseline:  Goal status: MET  4.  Patient will be able to walk without an assistive device without significant gait deviations for improved mobility.  Baseline:  Goal status: On-going  5.  Patient will be able to return to her community activities without being limited by her left hip.  Baseline:  Goal status: On-going   PLAN: PT FREQUENCY: 2x/week  PT DURATION: 4 weeks  PLANNED INTERVENTIONS: Therapeutic exercises, Therapeutic activity, Neuromuscular re-education, Balance training, Gait training, Patient/Family education, Joint mobilization, Electrical stimulation, Moist heat, Taping, Vasopneumatic device, Manual therapy, and Re-evaluation  PLAN FOR NEXT SESSION: nustep, step ups, and functional strengthening    Nikelle Malatesta,CHRIS, PTA 03/14/2022, 12:25 PM

## 2022-03-18 ENCOUNTER — Ambulatory Visit: Payer: Medicare PPO | Attending: Orthopedic Surgery | Admitting: Physical Therapy

## 2022-03-18 ENCOUNTER — Encounter: Payer: Self-pay | Admitting: Physical Therapy

## 2022-03-18 DIAGNOSIS — R262 Difficulty in walking, not elsewhere classified: Secondary | ICD-10-CM | POA: Insufficient documentation

## 2022-03-18 DIAGNOSIS — M25652 Stiffness of left hip, not elsewhere classified: Secondary | ICD-10-CM | POA: Insufficient documentation

## 2022-03-18 NOTE — Therapy (Signed)
OUTPATIENT PHYSICAL THERAPY LOWER EXTREMITY TREATMENT   Patient Name: Monique Day MRN: [id number] DOB:Oct 08, 1935, 86 y.o., female Today's Date: 03/18/2022   PT End of Session - 03/18/22 0958     Visit Number 7    Number of Visits 8    Date for PT Re-Evaluation 04/11/22    PT Start Time 0948    PT Stop Time 1028    PT Time Calculation (min) 40 min    Activity Tolerance Patient tolerated treatment well    Behavior During Therapy [facility] for tasks assessed/performed               Past Medical History:  Diagnosis Date   Adult idiopathic generalized osteoporosis    Arthritis    "minor" (08/18/2018)   Breast cancer, left breast ([place]) 1998   lumpectomy   Breast cancer, right breast ([place]) 2009   lumpectomy; mastectomy   DVT (deep venous thrombosis) ([place]) "early 2000's"   RLE   DVT (deep venous thrombosis) ([place])    Hemorrhoids, external    History of blood transfusion    "related to ulcerative colitis"   Lower abdominal pain    Occult blood in stools    Peripheral edema    Seizures ([place])    one very slight seizure with the stroke; somewhere between 2005-2009   Stroke [facility]) 2005-2009   "very light;" ; denies residual on 08/19/2018   UC (ulcerative colitis) ([place]) dx'd 6314   Past Surgical History:  Procedure Laterality Date   APPENDECTOMY     BIOPSY  04/14/2019   Procedure: BIOPSY;  Surgeon: [name], MD;  [facility];  Service: Endoscopy;;   bone density  12/11/10   BREAST BIOPSY Left 1998; 2019 X 2   BREAST BIOPSY Right 2009   BREAST LUMPECTOMY Left 1998   CATARACT EXTRACTION W/ INTRAOCULAR LENS  IMPLANT, BILATERAL Bilateral    COLONOSCOPY  02/21/2011   COLONOSCOPY  10/06/2008   COLONOSCOPY  12/27/01   COLONOSCOPY  05/09/05   COLONOSCOPY N/A 07/02/2016   Procedure: COLONOSCOPY;  Surgeon: [name], MD;  [facility];  Service: Endoscopy;  Laterality: N/A;  1055   COLONOSCOPY N/A 07/04/2021   Procedure: COLONOSCOPY;  Surgeon:  [name], MD;  [facility];  Service: Endoscopy;  Laterality: N/A;  7:30   [place]   "I was having rectal bleeding"   FLEXIBLE SIGMOIDOSCOPY N/A 04/14/2019   Procedure: FLEXIBLE SIGMOIDOSCOPY;  Surgeon: [name], MD;  [facility];  Service: Endoscopy;  Laterality: N/A;  1:00   FRACTURE SURGERY     INTRAMEDULLARY (IM) NAIL INTERTROCHANTERIC Left 12/19/2021   Procedure: INTRAMEDULLARY (IM) NAIL INTERTROCHANTRIC;  Surgeon: Carole Civil, MD;  Location: [facility];  Service: Orthopedics;  Laterality: Left;   MASTECTOMY Right 2009   MASTECTOMY COMPLETE / SIMPLE Left 08/19/2018   POLYPECTOMY  07/04/2021   Procedure: POLYPECTOMY;  Surgeon: [name], MD;  [facility];  Service: Endoscopy;;   PORTACATH PLACEMENT Right 10/15/2018   Procedure: INSERTION PORT-A-CATH RIGHT SUBCLAVIN;  Surgeon: [name], MD;  Location: [facility];  Service: General;  Laterality: Right;  pt knows to arrive at 7:30   [place] Left 08/19/2018   Procedure: LEFT SIMPLE MASTECTOMY;  Surgeon: [name], MD;  Location: [place];  Service: General;  Laterality: Left;   TONSILLECTOMY     VENA CAVA FILTER PLACEMENT Right    WRIST  FRACTURE SURGERY Right    Patient Active Problem List   Diagnosis Date Noted   Displaced intertrochanteric fracture of left femur, initial encounter for closed fracture ([place]) 12/17/2021   Constipation 04/02/2021   Rectal bleeding 04/05/2019   Ulcerative pancolitis without complication ([place]) 17/61/6073   Breast cancer, stage 1, left ([place]) 08/19/2018   Breast cancer of upper-inner quadrant of left female breast ([place]) 07/21/2018   Recurrent deep vein thrombosis (DVT) of right lower extremity ([place]) 03/08/2018   RBBB 03/08/2018   Hx of colonic polyps 03/14/2016   S/P IVC filter    Leukocytosis 07/12/2012   Hypotension 07/12/2012   Hyperglycemia 07/12/2012   Anemia 06/28/2012    Anemia due to blood loss, acute 06/09/2012   Hematochezia 06/09/2012   Warfarin-induced coagulopathy ([place]) 06/09/2012   Hyponatremia 06/07/2012   Dehydration 06/07/2012   UC (ulcerative colitis confined to rectum) ([place]) 11/05/2011   DVT (deep venous thrombosis) ([place]) 11/05/2011   REFERRING PROVIDER: Carole Civil, MD  REFERRING DIAG: Closed hip fracture, left, with routine healing  THERAPY DIAG:  Difficulty in walking, not elsewhere classified  Stiffness of left hip, not elsewhere classified  Rationale for Evaluation and Treatment Rehabilitation  ONSET DATE: Fracture: 12/17/21 Surgical repair: 12/19/21  SUBJECTIVE:   SUBJECTIVE STATEMENT: Doing good today. No hip pain  PAIN:  Are you having pain? No   NEXT MD FOLLOW UP:   06/09/2022  PERTINENT HISTORY: OA, osteoporosis, history of TIA history of breast cancer, neuropathy   PAIN:  Are you having pain? No  PRECAUTIONS: Fall  OBJECTIVE:   PATIENT SURVEYS:  FOTO 51.85           visit 1  TODAY'S TREATMENT:                                   7/28 EXERCISE LOG  Exercise Repetitions and Resistance Comments  Nustep L4 x 15 minutes   LAQ 5 lbs x 30 reps each LE   Standing heel / toe raises 20 reps   Forward step LLE x20 reps 6" step   Lateral step LLE 6" step x20 reps   Hip flexion 2# x20 reps   Hip abduction 2# x20 reps    HS curl Green x20 reps   Clam Green x20 reps   Sit to stand with clam Green x20 reps    Blank cell = exercise not performed today   GAIT:  WNL with very minor trunk lean  PATIENT EDUCATION:  Education details: healing and walking Person educated: Patient Education method: Explanation Education comprehension: verbalized understanding   HOME EXERCISE PROGRAM:   ASSESSMENT:  CLINICAL IMPRESSION:  Patient presented in clinic with reports of now walking without AD as much. Patient observed ambulating throughout the gym with only very minor trunk lean without AD. Patient able to  tolerate all therex fairly well with only intermittant reports of fatigue with strengthening. Patient able to met most goals and prepared for discharge. Patient has returned to church activities as well as helping at soup kitchen and plans to return to [employer] in September.   OBJECTIVE IMPAIRMENTS Abnormal gait, decreased activity tolerance, decreased balance, decreased mobility, difficulty walking, decreased ROM, decreased strength, and hypomobility.   ACTIVITY LIMITATIONS carrying, lifting, stairs, and locomotion level  PARTICIPATION LIMITATIONS: meal prep, cleaning, laundry, and community activity  PERSONAL FACTORS 3+ comorbidities: HTN, history of breast cancer, OA, and osteoporosis  are also affecting  patient's functional outcome.   REHAB POTENTIAL: Excellent  CLINICAL DECISION MAKING: Stable/uncomplicated   GOALS: Goals reviewed with patient? Yes  LONG TERM GOALS: Target date: 03/24/2022   Patient will be independent with her HEP.  Baseline:  Goal status: On-going  2.  Patient will be able to navigate at least 4 steps with a reciprocal pattern for improved function navigating her house.  Baseline:  Goal status: MET  3.  Patient will be able to walk at least 15 minutes without an assistive device for improved community mobility.  Baseline:  Goal status: MET  4.  Patient will be able to walk without an assistive device without significant gait deviations for improved mobility.  Baseline:  Goal status: MET  5.  Patient will be able to return to her community activities without being limited by her left hip.  Baseline:  Goal status: MET   PLAN: PT FREQUENCY: 2x/week  PT DURATION: 4 weeks  PLANNED INTERVENTIONS: Therapeutic exercises, Therapeutic activity, Neuromuscular re-education, Balance training, Gait training, Patient/Family education, Joint mobilization, Electrical stimulation, Moist heat, Taping, Vasopneumatic device, Manual therapy, and  Re-evaluation  PLAN FOR NEXT SESSION: nustep, step ups, and functional strengthening    Standley Brooking, PTA 03/18/2022, 10:42 AM

## 2022-03-20 ENCOUNTER — Ambulatory Visit (HOSPITAL_COMMUNITY): Payer: Medicare PPO | Admitting: Hematology

## 2022-03-21 ENCOUNTER — Ambulatory Visit: Payer: Medicare PPO

## 2022-03-21 DIAGNOSIS — R262 Difficulty in walking, not elsewhere classified: Secondary | ICD-10-CM | POA: Diagnosis not present

## 2022-03-21 DIAGNOSIS — M25652 Stiffness of left hip, not elsewhere classified: Secondary | ICD-10-CM

## 2022-03-21 NOTE — Therapy (Signed)
OUTPATIENT PHYSICAL THERAPY LOWER EXTREMITY TREATMENT   Patient Name: Monique Day MRN: [id number] DOB:1935/10/27, 86 y.o., female Today's Date: 03/21/2022   PT End of Session - 03/21/22 0948     Visit Number 8    Number of Visits 8    Date for PT Re-Evaluation 04/11/22    PT Start Time 0947    PT Stop Time 1025    PT Time Calculation (min) 38 min    Activity Tolerance Patient tolerated treatment well    Behavior During Therapy [facility] for tasks assessed/performed                Past Medical History:  Diagnosis Date   Adult idiopathic generalized osteoporosis    Arthritis    "minor" (08/18/2018)   Breast cancer, left breast ([place]) 1998   lumpectomy   Breast cancer, right breast ([place]) 2009   lumpectomy; mastectomy   DVT (deep venous thrombosis) ([place]) "early 2000's"   RLE   DVT (deep venous thrombosis) ([place])    Hemorrhoids, external    History of blood transfusion    "related to ulcerative colitis"   Lower abdominal pain    Occult blood in stools    Peripheral edema    Seizures ([place])    one very slight seizure with the stroke; somewhere between 2005-2009   Stroke [facility]) 2005-2009   "very light;" ; denies residual on 08/19/2018   UC (ulcerative colitis) ([place]) dx'd 3419   Past Surgical History:  Procedure Laterality Date   APPENDECTOMY     BIOPSY  04/14/2019   Procedure: BIOPSY;  Surgeon: [name], MD;  [facility];  Service: Endoscopy;;   bone density  12/11/10   BREAST BIOPSY Left 1998; 2019 X 2   BREAST BIOPSY Right 2009   BREAST LUMPECTOMY Left 1998   CATARACT EXTRACTION W/ INTRAOCULAR LENS  IMPLANT, BILATERAL Bilateral    COLONOSCOPY  02/21/2011   COLONOSCOPY  10/06/2008   COLONOSCOPY  12/27/01   COLONOSCOPY  05/09/05   COLONOSCOPY N/A 07/02/2016   Procedure: COLONOSCOPY;  Surgeon: [name], MD;  [facility];  Service: Endoscopy;  Laterality: N/A;  1055   COLONOSCOPY N/A 07/04/2021   Procedure: COLONOSCOPY;   Surgeon: [name], MD;  [facility];  Service: Endoscopy;  Laterality: N/A;  7:30   [place]   "I was having rectal bleeding"   FLEXIBLE SIGMOIDOSCOPY N/A 04/14/2019   Procedure: FLEXIBLE SIGMOIDOSCOPY;  Surgeon: [name], MD;  [facility];  Service: Endoscopy;  Laterality: N/A;  1:00   FRACTURE SURGERY     INTRAMEDULLARY (IM) NAIL INTERTROCHANTERIC Left 12/19/2021   Procedure: INTRAMEDULLARY (IM) NAIL INTERTROCHANTRIC;  Surgeon: Carole Civil, MD;  Location: [facility];  Service: Orthopedics;  Laterality: Left;   MASTECTOMY Right 2009   MASTECTOMY COMPLETE / SIMPLE Left 08/19/2018   POLYPECTOMY  07/04/2021   Procedure: POLYPECTOMY;  Surgeon: [name], MD;  [facility];  Service: Endoscopy;;   PORTACATH PLACEMENT Right 10/15/2018   Procedure: INSERTION PORT-A-CATH RIGHT SUBCLAVIN;  Surgeon: [name], MD;  Location: [facility];  Service: General;  Laterality: Right;  pt knows to arrive at 7:30   [place] Left 08/19/2018   Procedure: LEFT SIMPLE MASTECTOMY;  Surgeon: [name], MD;  Location: [place];  Service: General;  Laterality: Left;   TONSILLECTOMY     VENA CAVA FILTER PLACEMENT Right  WRIST FRACTURE SURGERY Right    Patient Active Problem List   Diagnosis Date Noted   Displaced intertrochanteric fracture of left femur, initial encounter for closed fracture ([place]) 12/17/2021   Constipation 04/02/2021   Rectal bleeding 04/05/2019   Ulcerative pancolitis without complication ([place]) 81/44/8185   Breast cancer, stage 1, left ([place]) 08/19/2018   Breast cancer of upper-inner quadrant of left female breast ([place]) 07/21/2018   Recurrent deep vein thrombosis (DVT) of right lower extremity ([place]) 03/08/2018   RBBB 03/08/2018   Hx of colonic polyps 03/14/2016   S/P IVC filter    Leukocytosis 07/12/2012   Hypotension 07/12/2012   Hyperglycemia 07/12/2012   Anemia  06/28/2012   Anemia due to blood loss, acute 06/09/2012   Hematochezia 06/09/2012   Warfarin-induced coagulopathy ([place]) 06/09/2012   Hyponatremia 06/07/2012   Dehydration 06/07/2012   UC (ulcerative colitis confined to rectum) ([place]) 11/05/2011   DVT (deep venous thrombosis) ([place]) 11/05/2011   REFERRING PROVIDER: Carole Civil, MD  REFERRING DIAG: Closed hip fracture, left, with routine healing  THERAPY DIAG:  Difficulty in walking, not elsewhere classified  Stiffness of left hip, not elsewhere classified  Rationale for Evaluation and Treatment Rehabilitation  ONSET DATE: Fracture: 12/17/21 Surgical repair: 12/19/21  SUBJECTIVE:   SUBJECTIVE STATEMENT: Patient reports that she feels good today and is ready to be discharged.   PAIN:  Are you having pain? No   NEXT MD FOLLOW UP:   06/09/2022  PERTINENT HISTORY: OA, osteoporosis, history of TIA history of breast cancer, neuropathy   PAIN:  Are you having pain? No  PRECAUTIONS: Fall  OBJECTIVE:   PATIENT SURVEYS:  FOTO 80.81             TODAY'S TREATMENT:                                   8/4 EXERCISE LOG  Exercise Repetitions and Resistance Comments  Nustep L5 x 15 minutes   Tandem walking 2 laps    Chair tap  15 reps             Blank cell = exercise not performed today                                    7/28 EXERCISE LOG  Exercise Repetitions and Resistance Comments  Nustep L4 x 15 minutes   LAQ 5 lbs x 30 reps each LE   Standing heel / toe raises 20 reps   Forward step LLE x20 reps 6" step   Lateral step LLE 6" step x20 reps   Hip flexion 2# x20 reps   Hip abduction 2# x20 reps    HS curl Green x20 reps   Clam Green x20 reps   Sit to stand with clam Green x20 reps    Blank cell = exercise not performed today   GAIT:  WNL with very minor trunk lean  PATIENT EDUCATION:  Education details: daily activities, HEP, benefits of exercise, exercises role in fall risk and bone density Person  educated: Patient Education method: Explanation Education comprehension: verbalized understanding   HOME EXERCISE PROGRAM:   ASSESSMENT:  CLINICAL IMPRESSION:  Patient was able to meet all of her goals for skilled physical therapy. She was educated on her HEP and the benefits of exercise. Her HEP was reviewed and she reported feeling  comfortable with these interventions. She reported feeling comfortable being discharged at this time.    OBJECTIVE IMPAIRMENTS Abnormal gait, decreased activity tolerance, decreased balance, decreased mobility, difficulty walking, decreased ROM, decreased strength, and hypomobility.   ACTIVITY LIMITATIONS carrying, lifting, stairs, and locomotion level  PARTICIPATION LIMITATIONS: meal prep, cleaning, laundry, and community activity  PERSONAL FACTORS 3+ comorbidities: HTN, history of breast cancer, OA, and osteoporosis  are also affecting patient's functional outcome.   REHAB POTENTIAL: Excellent  CLINICAL DECISION MAKING: Stable/uncomplicated   GOALS: Goals reviewed with patient? Yes  LONG TERM GOALS: Target date: 03/24/2022   Patient will be independent with her HEP.  Baseline:  Goal status: MET  2.  Patient will be able to navigate at least 4 steps with a reciprocal pattern for improved function navigating her house.  Baseline:  Goal status: MET  3.  Patient will be able to walk at least 15 minutes without an assistive device for improved community mobility.  Baseline:  Goal status: MET  4.  Patient will be able to walk without an assistive device without significant gait deviations for improved mobility.  Baseline:  Goal status: MET  5.  Patient will be able to return to her community activities without being limited by her left hip.  Baseline:  Goal status: MET   PLAN: PT FREQUENCY: 2x/week  PT DURATION: 4 weeks  PLANNED INTERVENTIONS: Therapeutic exercises, Therapeutic activity, Neuromuscular re-education, Balance training,  Gait training, Patient/Family education, Joint mobilization, Electrical stimulation, Moist heat, Taping, Vasopneumatic device, Manual therapy, and Re-evaluation  PLAN FOR NEXT SESSION: nustep, step ups, and functional strengthening   PHYSICAL THERAPY DISCHARGE SUMMARY  Visits from Start of Care: 8  Current functional level related to goals / functional outcomes: Patient was able to meet all of her goals for physical therapy.    Remaining deficits: None    Education / Equipment: HEP   Patient agrees to discharge. Patient goals were met. Patient is being discharged due to meeting the stated rehab goals.   Darlin Coco, PT 03/21/2022, 10:52 AM

## 2022-04-07 ENCOUNTER — Encounter (INDEPENDENT_AMBULATORY_CARE_PROVIDER_SITE_OTHER): Payer: Self-pay | Admitting: Gastroenterology

## 2022-04-07 ENCOUNTER — Ambulatory Visit (INDEPENDENT_AMBULATORY_CARE_PROVIDER_SITE_OTHER): Payer: Medicare PPO | Admitting: Gastroenterology

## 2022-04-07 VITALS — BP 105/69 | HR 80 | Temp 98.2°F | Ht 62.0 in | Wt 164.6 lb

## 2022-04-07 DIAGNOSIS — K5100 Ulcerative (chronic) pancolitis without complications: Secondary | ICD-10-CM

## 2022-04-07 MED ORDER — HUMIRA PEN 40 MG/0.8ML ~~LOC~~ PNKT: 40.0000 mg | PEN_INJECTOR | SUBCUTANEOUS | 11 refills | Status: AC

## 2022-04-07 MED ORDER — ERGOCALCIFEROL 10 MCG (400 UNIT) PO TABS: 1.0000 | ORAL_TABLET | Freq: Every day | ORAL | 3 refills | Status: DC

## 2022-04-07 NOTE — Patient Instructions (Addendum)
Continue Humira every 2 weeks Perform blood workup Start extra Vitamin D supplementation

## 2022-04-07 NOTE — Progress Notes (Unsigned)
Maylon Peppers, M.D. Gastroenterology & Hepatology [facility] [address] [place], [place] [address]  Primary Care Physician: [name], NP [address] [place] [address]  I will communicate my assessment and recommendations to the referring MD via EMR.  Problems: Ulcerative pancolitis on Humira  History of Present Illness: Monique Day is a 86 y.o. female with PMH  DVT, ulcerative pancolitis, seizures, breast cancer status post mastectomy, arthritis, who presents for follow up of ulcerative colitis.  The patient was last seen on 09/18/2021. At that time, the patient was continued on Humira 40 mg SQ q2 weeks.  She reports feeling well and denies any complaints. The patient denies having any nausea, vomiting, fever, chills, hematochezia, melena, hematemesis, abdominal distention, abdominal pain, diarrhea, jaundice, pruritus. Lost some weight on purpose. Has 1 soft BM every day - takes Miralax every day and some docusate occasionally as sometimes it is hard for the stool to completely come out.  Vitamin D was 29.5 in 02/2022. She is taking calcium and vitamin D supplementation.  Also had other labs on 02/27/2022 which showed mild hypokalemia of 3.3 with rest of electrolytes within normal limits, creatinine 1.25, liver enzymes were within normal limits, CBC was also normal.  Last flu shot:2022 Last pneumonia shot: yes, had the least potent one but not interested to have other shots Last zoster vaccine: 2 years ago, Shingrix Last DEXA scan: 2022 - osteopenia    Last Colonoscopy: 07/04/2021 - A single non-bleeding colonic angiodysplastic lesion. - Scar in the entire examined colon. - One 6 mm polyp in the distal rectum, removed with a cold snare. Resected and retrieved.  Past Medical History: Past Medical History:  Diagnosis Date   Adult idiopathic generalized osteoporosis    Arthritis    "minor" (08/18/2018)    Breast cancer, left breast ([place]) 1998   lumpectomy   Breast cancer, right breast ([place]) 2009   lumpectomy; mastectomy   DVT (deep venous thrombosis) ([place]) "early 2000's"   RLE   DVT (deep venous thrombosis) ([facility])    Hemorrhoids, external    History of blood transfusion    "related to ulcerative colitis"   Lower abdominal pain    Occult blood in stools    Peripheral edema    Seizures ([place])    one very slight seizure with the stroke; somewhere between 2005-2009   Stroke [facility]) 2005-2009   "very light;" ; denies residual on 08/19/2018   UC (ulcerative colitis) ([place]) dx'd 3159    Past Surgical History: Past Surgical History:  Procedure Laterality Date   APPENDECTOMY     BIOPSY  04/14/2019   Procedure: BIOPSY;  Surgeon: [name], MD;  Location: [facility] END[facility];  Service: Endoscopy;;   bone density  12/11/10   BREAST BIOPSY Left 1998; 2019 X 2   BREAST BIOPSY Right 2009   BREAST LUMPECTOMY Left 1998   CATARACT EXTRACTION W/ INTRAOCULAR LENS  IMPLANT, BILATERAL Bilateral    COLONOSCOPY  02/21/2011   COLONOSCOPY  10/06/2008   COLONOSCOPY  12/27/01   COLONOSCOPY  05/09/05   COLONOSCOPY N/A 07/02/2016   Procedure: COLONOSCOPY;  Surgeon: [name], MD;  Location: [facility] ENDO SU[facility];  Service: Endoscopy;  Laterality: N/A;  1055   COLONOSCOPY N/A 07/04/2021   Procedure: COLONOSCOPY;  Surgeon: [name], MD;  Location: [facility];  Service: Endoscopy;  Laterality: N/A;  7:30   [place]   "I was  having rectal bleeding"   FLEXIBLE SIGMOIDOSCOPY N/A 04/14/2019   Procedure: FLEXIBLE SIGMOIDOSCOPY;  Surgeon: [name], MD;  Location: [facility];  Service: Endoscopy;  Laterality: N/A;  1:00   FRACTURE SURGERY     INTRAMEDULLARY (IM) NAIL INTERTROCHANTERIC Left 12/19/2021   Procedure: INTRAMEDULLARY (IM) NAIL INTERTROCHANTRIC;  Surgeon: Carole Civil, MD;  Location: [facility] ORS;  Service: Orthopedics;  Laterality: Left;   MASTECTOMY Right 2009    MASTECTOMY COMPLETE / SIMPLE Left 08/19/2018   POLYPECTOMY  07/04/2021   Procedure: POLYPECTOMY;  Surgeon: [name], MD;  Location: [facility];  Service: Endoscopy;;   PORTACATH PLACEMENT Right 10/15/2018   Procedure: INSERTION PORT-A-CATH RIGHT SUBCLAVIN;  Surgeon: [name], MD;  Location: [facility] ORS;  Service: General;  Laterality: Right;  pt knows to arrive at 7:30   [place] Left 08/19/2018   Procedure: LEFT SIMPLE MASTECTOMY;  Surgeon: [name], MD;  Location: [facility];  Service: General;  Laterality: Left;   TONSILLECTOMY     VENA CAVA FILTER PLACEMENT Right    WRIST FRACTURE SURGERY Right     Family History: Family History  Problem Relation Age of Onset   Prostate cancer Father    Colon cancer Neg Hx     Social History: Social History   Tobacco Use  Smoking Status Former   Packs/day: 2.00   Years: 40.00   Total pack years: 80.00   Types: Cigarettes   Quit date: 05/05/2001   Years since quitting: 20.9   Passive exposure: Never  Smokeless Tobacco Never   Social History   Substance and Sexual Activity  Alcohol Use Yes   Comment: 08/18/2018 "couple drinks/year"   Social History   Substance and Sexual Activity  Drug Use Never    Allergies: Allergies  Allergen Reactions   Mercaptopurine Other (See Comments)    Dropped WBC   Sulfa Antibiotics Other (See Comments)    Extreme Weakness    Medications: Current Outpatient Medications  Medication Sig Dispense Refill   Calcium Carb-Cholecalciferol (CALCIUM 600 + D PO) Take 1 tablet by mouth daily.     denosumab (PROLIA) 60 MG/ML SOLN injection Inject 60 mg into the skin every 6 (six) months. Administer in upper arm, thigh, or abdomen     ELIQUIS 2.5 MG TABS tablet Take 1 tablet (2.5 mg total) by mouth 2 (two) times daily. 60 tablet    exemestane (AROMASIN) 25 MG tablet TAKE 1 TABLET DAILY AFTER BREAKFAST. 30 tablet 6   FARXIGA 10 MG TABS tablet Take 10 mg by  mouth daily.     fluticasone (CUTIVATE) 0.05 % cream SMARTSIG:1 Topical Daily     HUMIRA PEN 40 MG/0.8ML PNKT INJECT 40 MG EVERY 14 DAYS AS DIRECTED. (Patient taking differently: Inject 40 mg into the skin every 14 (fourteen) days.) 2 each 11   ketoconazole (NIZORAL) 2 % cream SMARTSIG:1 Topical Daily     Magnesium 250 MG TABS Take 250 mg by mouth daily.     OVER THE COUNTER MEDICATION Take 2-4 tablets by mouth 3 (three) times a week. Healthy Feet and Nerves otc supplement     polyethylene glycol (MIRALAX / GLYCOLAX) 17 g packet Take 17 g by mouth 2 (two) times daily. 14 each 0   PRESCRIPTION MEDICATION Apply 1 application topically daily as needed (irritation). Fluticasone 0.05 % and Ketoconazole 2% 1:1     No current facility-administered medications for this visit.   Facility-Administered Medications Ordered in Other  Visits  Medication Dose Route Frequency Provider Last Rate Last Admin   sodium chloride flush (NS) 0.9 % injection 10 mL  10 mL Intravenous PRN Derek Jack, MD   10 mL at 07/24/21 1429    Review of Systems: GENERAL: negative for malaise, night sweats HEENT: No changes in hearing or vision, no nose bleeds or other nasal problems. NECK: Negative for lumps, goiter, pain and significant neck swelling RESPIRATORY: Negative for cough, wheezing CARDIOVASCULAR: Negative for chest pain, leg swelling, palpitations, orthopnea GI: SEE HPI MUSCULOSKELETAL: Negative for joint pain or swelling, back pain, and muscle pain. SKIN: Negative for lesions, rash PSYCH: Negative for sleep disturbance, mood disorder and recent psychosocial stressors. HEMATOLOGY Negative for prolonged bleeding, bruising easily, and swollen nodes. ENDOCRINE: Negative for cold or heat intolerance, polyuria, polydipsia and goiter. NEURO: negative for tremor, gait imbalance, syncope and seizures. The remainder of the review of systems is noncontributory.   Physical Exam: BP 105/69 (BP Location: Right Arm,  Patient Position: Sitting, Cuff Size: Large)   Pulse 80   Temp 98.2 F (36.8 C) (Oral)   Ht 5' 2"  (1.575 m)   Wt 164 lb 9.6 oz (74.7 kg)   BMI 30.11 kg/m  GENERAL: The patient is AO x3, in no acute distress. HEENT: Head is normocephalic and atraumatic. EOMI are intact. Mouth is well hydrated and without lesions. NECK: Supple. No masses LUNGS: Clear to auscultation. No presence of rhonchi/wheezing/rales. Adequate chest expansion HEART: RRR, normal s1 and s2. ABDOMEN: Soft, nontender, no guarding, no peritoneal signs, and nondistended. BS +. No masses. EXTREMITIES: Without any cyanosis, clubbing, rash, lesions or edema. NEUROLOGIC: AOx3, no focal motor deficit. SKIN: no jaundice, no rashes  Imaging/Labs: as above  I personally reviewed and interpreted the available labs, imaging and endoscopic files.  Impression and Plan: Monique Day is a 86 y.o. female with PMH  DVT, ulcerative pancolitis, seizures, breast cancer status post mastectomy, arthritis, who presents for follow up of ulcerative colitis.  The patient has presented adequate response to Humira as she has achieved clinical and endoscopic remission based on most recent colonoscopy.  She has tolerated Humira every 2 weeks adequately without presenting any significant side effects.  Due to this, we will continue with Humira every 2 weeks and we will recheck her yearly QuantiFERON and hepatitis B surface antigen.  No need for other labs as she recently had blood testing.  She had borderline vitamin D levels, supplementation will be optimized with an extra dose of vitamin D daily.  -Continue Humira every 2 weeks -Check QuantiFERON and hepatitis B surface antigen - Start extra Vitamin D supplementation  All questions were answered.      Harvel Quale, MD Gastroenterology and Hepatology [facility]

## 2022-04-10 LAB — QUANTIFERON-TB GOLD PLUS
Mitogen-NIL: >10 IU/mL
NIL: 0.12 IU/mL
QuantiFERON-TB Gold Plus: NEGATIVE
TB1-NIL: <0 IU/mL
TB2-NIL: 0 IU/mL

## 2022-04-10 LAB — HEPATITIS B SURFACE ANTIGEN: Hepatitis B Surface Ag: NONREACTIVE

## 2022-06-06 ENCOUNTER — Inpatient Hospital Stay: Payer: Medicare PPO | Attending: Hematology

## 2022-06-06 VITALS — BP 134/83 | HR 67 | Temp 98.3°F | Resp 18

## 2022-06-06 DIAGNOSIS — Z86718 Personal history of other venous thrombosis and embolism: Secondary | ICD-10-CM | POA: Insufficient documentation

## 2022-06-06 DIAGNOSIS — C50212 Malignant neoplasm of upper-inner quadrant of left female breast: Secondary | ICD-10-CM | POA: Insufficient documentation

## 2022-06-06 DIAGNOSIS — Z79899 Other long term (current) drug therapy: Secondary | ICD-10-CM | POA: Diagnosis not present

## 2022-06-06 DIAGNOSIS — Z170 Estrogen receptor positive status [ER+]: Secondary | ICD-10-CM | POA: Diagnosis not present

## 2022-06-06 DIAGNOSIS — M8580 Other specified disorders of bone density and structure, unspecified site: Secondary | ICD-10-CM | POA: Diagnosis not present

## 2022-06-06 DIAGNOSIS — Z95828 Presence of other vascular implants and grafts: Secondary | ICD-10-CM

## 2022-06-06 MED ORDER — SODIUM CHLORIDE 0.9% FLUSH
10.0000 mL | INTRAVENOUS | Status: DC | PRN
  Administered 2022-06-06: 10 mL via INTRAVENOUS

## 2022-06-06 MED ORDER — HEPARIN SOD (PORK) LOCK FLUSH 100 UNIT/ML IV SOLN
500.0000 [IU] | Freq: Once | INTRAVENOUS | Status: AC
  Administered 2022-06-06: 500 [IU] via INTRAVENOUS

## 2022-06-06 NOTE — Progress Notes (Signed)
Monique Day presented for Portacath access and flush.  Portacath located right chest wall accessed with H 20 needle.  Good blood return present. Portacath flushed with 48m NS and 500U/561mHeparin and needle removed intact.  Procedure tolerated well and without incident. Discharged ambulatory from clinic follow up as scheduled.

## 2022-06-06 NOTE — Patient Instructions (Signed)
[facility]  Discharge Instructions: Thank you for choosing [facility] to provide your oncology and hematology care.  If you have a lab appointment with the [facility], please come in thru the Main Entrance and check in at the main information desk.  Wear comfortable clothing and clothing appropriate for easy access to any Portacath or PICC line.   We strive to give you quality time with your provider. You may need to reschedule your appointment if you arrive late (15 or more minutes).  Arriving late affects you and other patients whose appointments are after yours.  Also, if you miss three or more appointments without notifying the office, you may be dismissed from the clinic at the provider's discretion.      For prescription refill requests, have your pharmacy contact our office and allow 72 hours for refills to be completed.    Today you had your port flushed.   To help prevent nausea and vomiting after your treatment, we encourage you to take your nausea medication as directed.  BELOW ARE SYMPTOMS THAT SHOULD BE REPORTED IMMEDIATELY: *FEVER GREATER THAN 100.4 F (38 C) OR HIGHER *CHILLS OR SWEATING *NAUSEA AND VOMITING THAT IS NOT CONTROLLED WITH YOUR NAUSEA MEDICATION *UNUSUAL SHORTNESS OF BREATH *UNUSUAL BRUISING OR BLEEDING *URINARY PROBLEMS (pain or burning when urinating, or frequent urination) *BOWEL PROBLEMS (unusual diarrhea, constipation, pain near the anus) TENDERNESS IN MOUTH AND THROAT WITH OR WITHOUT PRESENCE OF ULCERS (sore throat, sores in mouth, or a toothache) UNUSUAL RASH, SWELLING OR PAIN  UNUSUAL VAGINAL DISCHARGE OR ITCHING   Items with * indicate a potential emergency and should be followed up as soon as possible or go to the Emergency Department if any problems should occur.  Please show the CHEMOTHERAPY ALERT CARD or IMMUNOTHERAPY ALERT CARD at check-in to the Emergency Department and triage nurse.  Should you have  questions after your visit or need to cancel or reschedule your appointment, please contact [facility] [phone]  and follow the prompts.  Office hours are 8:00 a.m. to 4:30 p.m. Monday - Friday. Please note that voicemails left after 4:00 p.m. may not be returned until the following business day.  We are closed weekends and major holidays. You have access to a nurse at all times for urgent questions. Please call the main number to the clinic [phone] and follow the prompts.  For any non-urgent questions, you may also contact your provider using MyChart. We now offer e-Visits for anyone 67 and older to request care online for non-urgent symptoms. For details visit [email or web address].   Also download the MyChart app! Go to the app store, search "MyChart", open the app, select [facility], and log in with your MyChart username and password.  Masks are optional in the cancer centers. If you would like for your care team to wear a mask while they are taking care of you, please let them know. You may have one support person who is at least 84 years old accompany you for your appointments.

## 2022-06-09 ENCOUNTER — Ambulatory Visit: Payer: Medicare PPO | Admitting: Orthopedic Surgery

## 2022-06-09 NOTE — Progress Notes (Deleted)
FOLLOW UP   Encounter Diagnosis  Name Primary?   Closed hip fracture, left, with routine healing, subsequent encounter Yes     No chief complaint on file.   Monique Day had a fracture of her left hip repaired on Dec 19, 2021.  This was treated with an Arthrex long nail  She is here for a follow-up 6 months post injury  ***

## 2022-06-12 ENCOUNTER — Other Ambulatory Visit: Payer: Self-pay | Admitting: Hematology

## 2022-06-12 ENCOUNTER — Other Ambulatory Visit: Payer: Self-pay | Admitting: *Deleted

## 2022-06-12 NOTE — Telephone Encounter (Signed)
Refill for Aromasin approved.  Patient tolerating and is to continue therapy.

## 2022-06-19 ENCOUNTER — Other Ambulatory Visit: Payer: Self-pay | Admitting: Hematology

## 2022-06-19 ENCOUNTER — Other Ambulatory Visit: Payer: Self-pay | Admitting: *Deleted

## 2022-06-19 ENCOUNTER — Encounter: Payer: Self-pay | Admitting: Orthopedic Surgery

## 2022-06-19 ENCOUNTER — Ambulatory Visit: Payer: Medicare PPO | Admitting: Orthopedic Surgery

## 2022-06-19 DIAGNOSIS — S72002D Fracture of unspecified part of neck of left femur, subsequent encounter for closed fracture with routine healing: Secondary | ICD-10-CM

## 2022-06-19 NOTE — Progress Notes (Signed)
FOLLOW UP   Encounter Diagnosis  Name Primary?   Closed hip fracture, left, with routine healing, subsequent encounter 12/19/21 long IM nail Yes     Chief Complaint  Patient presents with   Follow-up    Recheck on left hip, DOS 12-19-21.     Monique Day is here for the 1-monthfollow-up her fracture healed nicely no complications noted  She has no complaints with her hip she says her back is stiff sometimes  She is ambulatory with no cane  She has no pain on range of motion of the hip leg lengths are equal rotational alignment is normal  I released her follow-up as needed

## 2022-06-19 NOTE — Telephone Encounter (Signed)
Refill approved for Aromasin.  Patient tolerating and is to continue therapy.

## 2022-08-12 ENCOUNTER — Other Ambulatory Visit: Payer: Self-pay | Admitting: Hematology

## 2022-08-12 ENCOUNTER — Other Ambulatory Visit: Payer: Self-pay | Admitting: *Deleted

## 2022-08-12 NOTE — Telephone Encounter (Signed)
Refill for Aromasin approved.  Patient is tolerating and is to continue therapy.

## 2022-09-01 ENCOUNTER — Inpatient Hospital Stay: Payer: Medicare PPO | Attending: Hematology

## 2022-09-01 VITALS — BP 155/69 | HR 78 | Temp 97.8°F | Resp 17

## 2022-09-01 DIAGNOSIS — Z95828 Presence of other vascular implants and grafts: Secondary | ICD-10-CM

## 2022-09-01 DIAGNOSIS — Z7901 Long term (current) use of anticoagulants: Secondary | ICD-10-CM | POA: Diagnosis not present

## 2022-09-01 DIAGNOSIS — Z79811 Long term (current) use of aromatase inhibitors: Secondary | ICD-10-CM | POA: Insufficient documentation

## 2022-09-01 DIAGNOSIS — K5190 Ulcerative colitis, unspecified, without complications: Secondary | ICD-10-CM | POA: Insufficient documentation

## 2022-09-01 DIAGNOSIS — C50212 Malignant neoplasm of upper-inner quadrant of left female breast: Secondary | ICD-10-CM | POA: Insufficient documentation

## 2022-09-01 DIAGNOSIS — Z79899 Other long term (current) drug therapy: Secondary | ICD-10-CM | POA: Insufficient documentation

## 2022-09-01 DIAGNOSIS — Z170 Estrogen receptor positive status [ER+]: Secondary | ICD-10-CM | POA: Diagnosis not present

## 2022-09-01 DIAGNOSIS — M8580 Other specified disorders of bone density and structure, unspecified site: Secondary | ICD-10-CM | POA: Insufficient documentation

## 2022-09-01 DIAGNOSIS — G479 Sleep disorder, unspecified: Secondary | ICD-10-CM | POA: Diagnosis not present

## 2022-09-01 DIAGNOSIS — Z882 Allergy status to sulfonamides status: Secondary | ICD-10-CM | POA: Insufficient documentation

## 2022-09-01 LAB — COMPREHENSIVE METABOLIC PANEL
ALT: 20 U/L (ref 0–44)
AST: 22 U/L (ref 15–41)
Albumin: 4 g/dL (ref 3.5–5.0)
Alkaline Phosphatase: 37 U/L — ABNORMAL LOW (ref 38–126)
Anion gap: 9 (ref 5–15)
BUN: 22 mg/dL (ref 8–23)
CO2: 24 mmol/L (ref 22–32)
Calcium: 8.9 mg/dL (ref 8.9–10.3)
Chloride: 103 mmol/L (ref 98–111)
Creatinine, Ser: 1.05 mg/dL — ABNORMAL HIGH (ref 0.44–1.00)
GFR, Estimated: 52 mL/min — ABNORMAL LOW (ref >60)
Glucose, Bld: 102 mg/dL — ABNORMAL HIGH (ref 70–99)
Potassium: 3.9 mmol/L (ref 3.5–5.1)
Sodium: 136 mmol/L (ref 135–145)
Total Bilirubin: 0.8 mg/dL (ref 0.3–1.2)
Total Protein: 7.2 g/dL (ref 6.5–8.1)

## 2022-09-01 LAB — CBC WITH DIFFERENTIAL/PLATELET
Abs Immature Granulocytes: 0.02 10*3/uL (ref 0.00–0.07)
Basophils Absolute: 0 10*3/uL (ref 0.0–0.1)
Basophils Relative: 1 %
Eosinophils Absolute: 0.1 10*3/uL (ref 0.0–0.5)
Eosinophils Relative: 2 %
HCT: 44.4 % (ref 36.0–46.0)
Hemoglobin: 14.8 g/dL (ref 12.0–15.0)
Immature Granulocytes: 0 %
Lymphocytes Relative: 34 %
Lymphs Abs: 2.1 10*3/uL (ref 0.7–4.0)
MCH: 32.5 pg (ref 26.0–34.0)
MCHC: 33.3 g/dL (ref 30.0–36.0)
MCV: 97.4 fL (ref 80.0–100.0)
Monocytes Absolute: 0.5 10*3/uL (ref 0.1–1.0)
Monocytes Relative: 8 %
Neutro Abs: 3.5 10*3/uL (ref 1.7–7.7)
Neutrophils Relative %: 55 %
Platelets: 196 10*3/uL (ref 150–400)
RBC: 4.56 MIL/uL (ref 3.87–5.11)
RDW: 12.8 % (ref 11.5–15.5)
WBC: 6.3 10*3/uL (ref 4.0–10.5)
nRBC: 0 % (ref 0.0–0.2)

## 2022-09-01 LAB — VITAMIN D 25 HYDROXY (VIT D DEFICIENCY, FRACTURES): Vit D, 25-Hydroxy: 38.18 ng/mL (ref 30–100)

## 2022-09-01 MED ORDER — SODIUM CHLORIDE 0.9% FLUSH
10.0000 mL | Freq: Once | INTRAVENOUS | Status: AC
  Administered 2022-09-01: 10 mL via INTRAVENOUS

## 2022-09-01 MED ORDER — HEPARIN SOD (PORK) LOCK FLUSH 100 UNIT/ML IV SOLN
500.0000 [IU] | Freq: Once | INTRAVENOUS | Status: AC
  Administered 2022-09-01: 500 [IU] via INTRAVENOUS

## 2022-09-01 NOTE — Patient Instructions (Signed)
[facility]  Discharge Instructions: Thank you for choosing [facility] to provide your oncology and hematology care.  If you have a lab appointment with the [facility], please come in thru the Main Entrance and check in at the main information desk.  Wear comfortable clothing and clothing appropriate for easy access to any Portacath or PICC line.   We strive to give you quality time with your provider. You may need to reschedule your appointment if you arrive late (15 or more minutes).  Arriving late affects you and other patients whose appointments are after yours.  Also, if you miss three or more appointments without notifying the office, you may be dismissed from the clinic at the provider's discretion.      For prescription refill requests, have your pharmacy contact our office and allow 72 hours for refills to be completed.    Today you received the following port flush with labs, return as scheduled.   To help prevent nausea and vomiting after your treatment, we encourage you to take your nausea medication as directed.  BELOW ARE SYMPTOMS THAT SHOULD BE REPORTED IMMEDIATELY: *FEVER GREATER THAN 100.4 F (38 C) OR HIGHER *CHILLS OR SWEATING *NAUSEA AND VOMITING THAT IS NOT CONTROLLED WITH YOUR NAUSEA MEDICATION *UNUSUAL SHORTNESS OF BREATH *UNUSUAL BRUISING OR BLEEDING *URINARY PROBLEMS (pain or burning when urinating, or frequent urination) *BOWEL PROBLEMS (unusual diarrhea, constipation, pain near the anus) TENDERNESS IN MOUTH AND THROAT WITH OR WITHOUT PRESENCE OF ULCERS (sore throat, sores in mouth, or a toothache) UNUSUAL RASH, SWELLING OR PAIN  UNUSUAL VAGINAL DISCHARGE OR ITCHING   Items with * indicate a potential emergency and should be followed up as soon as possible or go to the Emergency Department if any problems should occur.  Please show the CHEMOTHERAPY ALERT CARD or IMMUNOTHERAPY ALERT CARD at check-in to the Emergency  Department and triage nurse.  Should you have questions after your visit or need to cancel or reschedule your appointment, please contact [facility] [phone]  and follow the prompts.  Office hours are 8:00 a.m. to 4:30 p.m. Monday - Friday. Please note that voicemails left after 4:00 p.m. may not be returned until the following business day.  We are closed weekends and major holidays. You have access to a nurse at all times for urgent questions. Please call the main number to the clinic [phone] and follow the prompts.  For any non-urgent questions, you may also contact your provider using MyChart. We now offer e-Visits for anyone 77 and older to request care online for non-urgent symptoms. For details visit [email or web address].   Also download the MyChart app! Go to the app store, search "MyChart", open the app, select [facility], and log in with your MyChart username and password.

## 2022-09-01 NOTE — Progress Notes (Signed)
Port flushed with good blood return noted. No bruising or swelling at site. Bandaid applied and patient discharged in satisfactory condition. VVS stable with no signs or symptoms of distressed noted. 

## 2022-09-02 ENCOUNTER — Telehealth: Payer: Self-pay | Admitting: Orthopedic Surgery

## 2022-09-02 NOTE — Telephone Encounter (Signed)
Returned the patient's call, lvm for her to call us back.  Pt's # [phone]

## 2022-09-04 ENCOUNTER — Ambulatory Visit (INDEPENDENT_AMBULATORY_CARE_PROVIDER_SITE_OTHER): Payer: Medicare PPO

## 2022-09-04 ENCOUNTER — Ambulatory Visit: Payer: Medicare PPO | Admitting: Orthopedic Surgery

## 2022-09-04 ENCOUNTER — Encounter: Payer: Self-pay | Admitting: Orthopedic Surgery

## 2022-09-04 VITALS — Ht 62.0 in | Wt 164.0 lb

## 2022-09-04 DIAGNOSIS — M7918 Myalgia, other site: Secondary | ICD-10-CM

## 2022-09-04 DIAGNOSIS — S72002D Fracture of unspecified part of neck of left femur, subsequent encounter for closed fracture with routine healing: Secondary | ICD-10-CM

## 2022-09-04 DIAGNOSIS — M545 Low back pain, unspecified: Secondary | ICD-10-CM

## 2022-09-04 DIAGNOSIS — M48061 Spinal stenosis, lumbar region without neurogenic claudication: Secondary | ICD-10-CM

## 2022-09-04 NOTE — Patient Instructions (Addendum)
Physical therapy has been ordered for you in [place] . They should call you to schedule, [phone]  s the phone number to call, if you want to call to schedule.

## 2022-09-04 NOTE — Progress Notes (Signed)
Chief Complaint  Patient presents with   Back Pain    Pain in the L buttocks that's causing a painful "catch" in left leg for 1 wk.   HPI  This is a 87 year old female who had a left hip fracture which was treated with a long IM nail cephalic medullary May 6754 did surprisingly well with minimal pain or discomfort.  She was last seen November this year and released  Comes in with a 1 week history of pain in the left buttock with a catching sensation that causes pain when she starts to walk she also feels that the legs are heavy and may not be able to hold her up  2 years ago she was lifting a suitcase and had some centralized back pain but that was treated and resolved.  Examination of both hips show that she has normal range of motion no groin pain with rotation leg lengths are equal incision is healed without infection  We will get x-rays of the back and the implant  Imaging of the implant includes 5 used include the entire implant.  The fracture healed the implant is in good position there is no prominent hardware  Spinal imaging shows coronal plane abnormality with thoracolumbar scoliosis.  No spondylolysis or listhesis.  We do see some lower lumbar facet arthrosis.  No fracture or pathologic bone lesion is seen.  There is some significant lower thoracic degenerative changes.  Impression early spinal stenosis  Recommend physical therapy  Encounter Diagnoses  Name Primary?   Closed hip fracture, left, with routine healing, subsequent encounter Yes   Lumbar pain    Pain in left buttock    Spinal stenosis of lumbar region without neurogenic claudication    RETURN IN 6 WEEKS

## 2022-09-08 ENCOUNTER — Other Ambulatory Visit: Payer: Self-pay | Admitting: Hematology

## 2022-09-08 ENCOUNTER — Inpatient Hospital Stay: Payer: Medicare PPO | Admitting: Hematology

## 2022-09-08 ENCOUNTER — Encounter: Payer: Self-pay | Admitting: Hematology

## 2022-09-08 ENCOUNTER — Other Ambulatory Visit (HOSPITAL_COMMUNITY): Payer: Self-pay | Admitting: *Deleted

## 2022-09-08 VITALS — BP 151/67 | HR 87 | Temp 98.9°F | Resp 18 | Ht 62.5 in | Wt 170.0 lb

## 2022-09-08 DIAGNOSIS — Z170 Estrogen receptor positive status [ER+]: Secondary | ICD-10-CM

## 2022-09-08 DIAGNOSIS — C50212 Malignant neoplasm of upper-inner quadrant of left female breast: Secondary | ICD-10-CM | POA: Diagnosis not present

## 2022-09-08 MED ORDER — EXEMESTANE 25 MG PO TABS: ORAL_TABLET | ORAL | 6 refills | Status: DC

## 2022-09-08 NOTE — Telephone Encounter (Signed)
Refill for Aromasin approved.  Patient is tolerating therapy and is to continue.

## 2022-09-08 NOTE — Progress Notes (Signed)
[place] [address] [place] [address]   Patient Care Team: [name], NP as PCP - General (Internal Medicine)  SUMMARY OF ONCOLOGIC HISTORY: Oncology History  Breast cancer of upper-inner quadrant of left female breast ([place])  07/21/2018 Initial Diagnosis   Breast cancer of upper-inner quadrant of left female breast ([place])   10/22/2018 - 10/21/2019 Chemotherapy   Patient is on Treatment Plan : BREAST weekly PACLitaxel / trastuzumab / Maintenance trastuzumab every 21 days       CHIEF COMPLIANT: Follow-up for breast cancer   INTERVAL HISTORY: [name] is a 87 y.o. female seen for follow-up of breast cancer.  She is tolerating exemestane very well.  Energy levels are 75%.  Denies any new onset pains.  REVIEW OF SYSTEMS:   Review of Systems  Constitutional:  Negative for appetite change and fatigue.  Psychiatric/Behavioral:  Positive for sleep disturbance.   All other systems reviewed and are negative.   I have reviewed the past medical history, past surgical history, social history and family history with the patient and they are unchanged from previous note.   ALLERGIES:   is allergic to mercaptopurine and sulfa antibiotics.   MEDICATIONS:  Current Outpatient Medications  Medication Sig Dispense Refill   Adalimumab (HUMIRA PEN) 40 MG/0.8ML PNKT Inject 40 mg into the skin every 14 (fourteen) days. INJECT 40 MG EVERY 14 DAYS AS DIRECTED 2 each 11   Calcium Carb-Cholecalciferol (CALCIUM 600 + D PO) Take 1 tablet by mouth daily.     denosumab (PROLIA) 60 MG/ML SOLN injection Inject 60 mg into the skin every 6 (six) months. Administer in upper arm, thigh, or abdomen     ELIQUIS 2.5 MG TABS tablet Take 1 tablet (2.5 mg total) by mouth 2 (two) times daily. 60 tablet    Ergocalciferol 10 MCG (400 UNIT) TABS Take 1 each by mouth daily. 90 tablet 3   exemestane (AROMASIN) 25 MG tablet TAKE 1 TABLET DAILY AFTER BREAKFAST. 30 tablet 0    FARXIGA 10 MG TABS tablet Take 10 mg by mouth daily.     fluticasone (CUTIVATE) 0.05 % cream SMARTSIG:1 Topical Daily     ketoconazole (NIZORAL) 2 % cream SMARTSIG:1 Topical Daily     Magnesium 250 MG TABS Take 250 mg by mouth daily.     OVER THE COUNTER MEDICATION Take 2-4 tablets by mouth 3 (three) times a week. Healthy Feet and Nerves otc supplement     polyethylene glycol (MIRALAX / GLYCOLAX) 17 g packet Take 17 g by mouth 2 (two) times daily. 14 each 0   PRESCRIPTION MEDICATION Apply 1 application topically daily as needed (irritation). Fluticasone 0.05 % and Ketoconazole 2% 1:1     No current facility-administered medications for this visit.   Facility-Administered Medications Ordered in Other Visits  Medication Dose Route Frequency Provider Last Rate Last Admin   sodium chloride flush (NS) 0.9 % injection 10 mL  10 mL Intravenous PRN Derek Jack, MD   10 mL at 07/24/21 1429     PHYSICAL EXAMINATION: Performance status (ECOG): 1 - Symptomatic but completely ambulatory  Vitals:   09/08/22 1421  BP: (!) 151/67  Pulse: 87  Resp: 18  Temp: 98.9 F (37.2 C)  SpO2: 96%   Wt Readings from Last 3 Encounters:  09/08/22 170 lb (77.1 kg)  09/04/22 164 lb (74.4 kg)  04/07/22 164 lb 9.6 oz (74.7 kg)   Physical Exam Vitals reviewed.  Constitutional:  Appearance: Normal appearance.  Cardiovascular:     Rate and Rhythm: Normal rate and regular rhythm.     Pulses: Normal pulses.     Heart sounds: Normal heart sounds.  Pulmonary:     Effort: Pulmonary effort is normal.     Breath sounds: Normal breath sounds.  Chest:  Breasts:    Right: Absent. No swelling, bleeding, mass, skin change (mastectomy site WNL) or tenderness.     Left: Absent. No swelling, bleeding, mass, skin change (mastectomy site WNL) or tenderness.  Lymphadenopathy:     Upper Body:     Right upper body: No supraclavicular or axillary adenopathy.     Left upper body: No supraclavicular or axillary  adenopathy.  Neurological:     General: No focal deficit present.     Mental Status: She is alert and oriented to person, place, and time.  Psychiatric:        Mood and Affect: Mood normal.        Behavior: Behavior normal.     Breast Exam Chaperone: Thana Ates     LABORATORY DATA:  I have reviewed the data as listed    Latest Ref Rng & Units 09/01/2022   10:57 AM 02/27/2022    2:08 PM 12/21/2021    6:21 AM  CMP  Glucose 70 - 99 mg/dL 102  146  96   BUN 8 - 23 mg/dL '22  22  31   '$ Creatinine 0.44 - 1.00 mg/dL 1.05  1.25  0.90   Sodium 135 - 145 mmol/L 136  136  134   Potassium 3.5 - 5.1 mmol/L 3.9  3.3  4.1   Chloride 98 - 111 mmol/L 103  103  103   CO2 22 - 32 mmol/L '24  26  27   '$ Calcium 8.9 - 10.3 mg/dL 8.9  9.0  7.8   Total Protein 6.5 - 8.1 g/dL 7.2  7.1    Total Bilirubin 0.3 - 1.2 mg/dL 0.8  0.5    Alkaline Phos 38 - 126 U/L 37  77    AST 15 - 41 U/L 22  20    ALT 0 - 44 U/L 20  17     No results found for: "CAN153" Lab Results  Component Value Date   WBC 6.3 09/01/2022   HGB 14.8 09/01/2022   HCT 44.4 09/01/2022   MCV 97.4 09/01/2022   PLT 196 09/01/2022   NEUTROABS 3.5 09/01/2022    ASSESSMENT:  1.  Stage I HER-2 positive left breast cancer: -Left mastectomy on 08/19/2018, 1.1 cm IDC, grade 1, margins negative.  No lymph nodes were identified.  ER positive/PR positive/HER 2 3+. -12 doses of weekly paclitaxel and Herceptin from 10/22/2018 through 01/14/2019. -Herceptin every 3 weeks started on 02/03/2019 her last dose was on 10/21/2019. -Anastrozole started in December 2019, made her confused and forgetful. -Switched to Femara on 02/03/2019, which is causing vaginal irritation that has worsened over the past 6 months. - She was switched to exemestane.   2.  Osteopenia: -Last DEXA scan on 02/08/2018 showed a T score of -1.2. -She is on Prolia every 6 months with Dr. Melina Copa.     3.  Recurrent DVTs: -She has had multiple recurrent DVTs in the past and is on  Eliquis. -She denies any bleeding episodes.   4.  Ulcerative colitis: -She is on Humira for the past few years.  She is off of mesalamine. -She is following up with Dr. Laural Golden   PLAN:  1.  Stage I HER-2 positive left breast cancer: - She is tolerating exemestane well. - Bilateral mastectomy sites are within normal limits. - Reviewed labs today which showed normal LFTs and CBC. - RTC 6 months for follow-up with repeat labs.  Continue exemestane.     2.  Osteopenia: - DEXA scan on 12/19/2020 T-score -1.3. - Continue Prolia injections with her PMD. - Vitamin D is 38.  Will repeat DEXA scan prior to next visit.     3.  Recurrent DVTs: - Continue Eliquis.  No bleeding issues.  Breast Cancer therapy associated bone loss: I have recommended calcium, Vitamin D and weight bearing exercises.  Orders placed this encounter:  No orders of the defined types were placed in this encounter.   The patient has a good understanding of the overall plan. She agrees with it. She will call with any problems that may develop before the next visit here.  Derek Jack, MD [place] [phone]

## 2022-09-08 NOTE — Patient Instructions (Signed)
[place] at [facility] Discharge Instructions   You were seen and examined today by Dr. Delton Coombes.  He reviewed the results of your lab work which are normal/stable.   Continue exemestane as prescribed. A refill has been sent to your pharmacy.   We will do a bone density prior to your next visit.   Return as scheduled in 6 months.    Thank you for choosing [place] at [facility] to provide your oncology and hematology care.  To afford each patient quality time with our provider, please arrive at least 15 minutes before your scheduled appointment time.   If you have a lab appointment with the [facility] please come in thru the Main Entrance and check in at the main information desk.  You need to re-schedule your appointment should you arrive 10 or more minutes late.  We strive to give you quality time with our providers, and arriving late affects you and other patients whose appointments are after yours.  Also, if you no show three or more times for appointments you may be dismissed from the clinic at the providers discretion.     Again, thank you for choosing [facility].  Our hope is that these requests will decrease the amount of time that you wait before being seen by our physicians.       _____________________________________________________________  Should you have questions after your visit to [facility], please contact our office at [phone] and follow the prompts.  Our office hours are 8:00 a.m. and 4:30 p.m. Monday - Friday.  Please note that voicemails left after 4:00 p.m. may not be returned until the following business day.  We are closed weekends and major holidays.  You do have access to a nurse 24-7, just call the main number to the clinic [phone] and do not press any options, hold on the line and a nurse will answer the phone.    For prescription refill requests, have your pharmacy  contact our office and allow 72 hours.    Due to Covid, you will need to wear a mask upon entering the hospital. If you do not have a mask, a mask will be given to you at the Main Entrance upon arrival. For doctor visits, patients may have 1 support person age 30 or older with them. For treatment visits, patients can not have anyone with them due to social distancing guidelines and our immunocompromised population.

## 2022-09-16 ENCOUNTER — Ambulatory Visit: Payer: Medicare PPO | Attending: Orthopedic Surgery

## 2022-09-16 ENCOUNTER — Other Ambulatory Visit: Payer: Self-pay

## 2022-09-16 DIAGNOSIS — M5459 Other low back pain: Secondary | ICD-10-CM | POA: Insufficient documentation

## 2022-09-16 DIAGNOSIS — M48061 Spinal stenosis, lumbar region without neurogenic claudication: Secondary | ICD-10-CM | POA: Diagnosis not present

## 2022-09-16 DIAGNOSIS — M7918 Myalgia, other site: Secondary | ICD-10-CM | POA: Diagnosis not present

## 2022-09-16 DIAGNOSIS — M545 Low back pain, unspecified: Secondary | ICD-10-CM | POA: Insufficient documentation

## 2022-09-16 NOTE — Therapy (Signed)
OUTPATIENT PHYSICAL THERAPY THORACOLUMBAR EVALUATION   Patient Name: Monique Day MRN: [id number] DOB:Nov 30, 1935, 87 y.o., female Today's Date: 09/16/2022  END OF SESSION:  PT End of Session - 09/16/22 0945     Visit Number 1    Number of Visits 1    Date for PT Re-Evaluation 09/17/22    PT Start Time 7106    PT Stop Time 1022    PT Time Calculation (min) 35 min    Activity Tolerance Patient tolerated treatment well    Behavior During Therapy [facility] for tasks assessed/performed             Past Medical History:  Diagnosis Date   Adult idiopathic generalized osteoporosis    Arthritis    "minor" (08/18/2018)   Breast cancer, left breast ([place]) 1998   lumpectomy   Breast cancer, right breast ([place]) 2009   lumpectomy; mastectomy   DVT (deep venous thrombosis) ([place]) "early 2000's"   RLE   DVT (deep venous thrombosis) ([place])    Hemorrhoids, external    History of blood transfusion    "related to ulcerative colitis"   Lower abdominal pain    Occult blood in stools    Peripheral edema    Seizures ([place])    one very slight seizure with the stroke; somewhere between 2005-2009   Stroke [facility]) 2005-2009   "very light;" ; denies residual on 08/19/2018   UC (ulcerative colitis) ([place]) dx'd 1957   Warfarin-induced coagulopathy ([place]) 06/09/2012   Past Surgical History:  Procedure Laterality Date   APPENDECTOMY     BIOPSY  04/14/2019   Procedure: BIOPSY;  Surgeon: [name], MD;  Location: [facility];  Service: Endoscopy;;   bone density  12/11/10   BREAST BIOPSY Left 1998; 2019 X 2   BREAST BIOPSY Right 2009   BREAST LUMPECTOMY Left 1998   CATARACT EXTRACTION W/ INTRAOCULAR LENS  IMPLANT, BILATERAL Bilateral    COLONOSCOPY  02/21/2011   COLONOSCOPY  10/06/2008   COLONOSCOPY  12/27/01   COLONOSCOPY  05/09/05   COLONOSCOPY N/A 07/02/2016   Procedure: COLONOSCOPY;  Surgeon: [name], MD;  Location: [facility];  Service: Endoscopy;  Laterality: N/A;  1055    COLONOSCOPY N/A 07/04/2021   Procedure: COLONOSCOPY;  Surgeon: [name], MD;  Location[facility];  Service: Endoscopy;  Laterality: N/A;  7:30   [place]   "I was having rectal bleeding"   FLEXIBLE SIGMOIDOSCOPY N/A 04/14/2019   Procedure: FLEXIBLE SIGMOIDOSCOPY;  Surgeon: [name], MD;  [facility];  Service: Endoscopy;  Laterality: N/A;  1:00   FRACTURE SURGERY     INTRAMEDULLARY (IM) NAIL INTERTROCHANTERIC Left 12/19/2021   Procedure: INTRAMEDULLARY (IM) NAIL INTERTROCHANTRIC;  Surgeon: Carole Civil, MD;  Location: [facility] ORS;  Service: Orthopedics;  Laterality: Left;   MASTECTOMY Right 2009   MASTECTOMY COMPLETE / SIMPLE Left 08/19/2018   POLYPECTOMY  07/04/2021   Procedure: POLYPECTOMY;  Surgeon: [name], MD;  Location[facility];  Service: Endoscopy;;   PORTACATH PLACEMENT Right 10/15/2018   Procedure: INSERTION PORT-A-CATH RIGHT SUBCLAVIN;  Surgeon: [name], MD;  Location: [facility] OR[facility];  Service: General;  Laterality: Right;  pt knows to arrive at 7:30   [place] Left 08/19/2018   Procedure: LEFT SIMPLE MASTECTOMY;  Surgeon: [name], MD;  Location: [place];  Service: General;  Laterality: Left;   [place]  PLACEMENT Right    WRIST FRACTURE SURGERY Right    Patient Active Problem List   Diagnosis Date Noted   Displaced intertrochanteric fracture of left femur, initial encounter for closed fracture ([place]) 12/17/2021   Constipation 04/02/2021   Ulcerative pancolitis without complication ([place]) 19/14/7829   Breast cancer, stage 1, left ([place]) 08/19/2018   Breast cancer of upper-inner quadrant of left female breast ([place]) 07/21/2018   Recurrent deep vein thrombosis (DVT) of right lower extremity ([place]) 03/08/2018   RBBB 03/08/2018   Hx of colonic polyps 03/14/2016   S/P IVC filter    Leukocytosis 07/12/2012   Hypotension 07/12/2012   Hyperglycemia  07/12/2012   Anemia 06/28/2012   Hematochezia 06/09/2012   Hyponatremia 06/07/2012   Dehydration 06/07/2012   DVT (deep venous thrombosis) ([place]) 11/05/2011    PCP: [name], NP  REFERRING PROVIDER: Carole Civil, MD   REFERRING DIAG: Lumbar pain; Pain in left buttock; Spinal stenosis of lumbar region without neurogenic claudication   Rationale for Evaluation and Treatment: Rehabilitation  THERAPY DIAG:  Other low back pain  ONSET DATE: "a while now"   SUBJECTIVE:                                                                                                                                                                                           SUBJECTIVE STATEMENT: Patient reports that she feels a lot better in the past few days. She notes that she has been able to do almost everything she needs to. However, she still has some numbness in her right leg. She notes that it is primarily in her right foot right now, but last Friday her leg was numb from the knee down. She has been having these symptoms "for a while, but they have not been limiting me." Her left hip started bothering her about 3 weeks ago, but it has not bothered her in the past 2-3 days.   PERTINENT HISTORY:  OA, osteoporosis, history of TIA history of breast cancer, neuropathy   PAIN:  Are you having pain? Yes: NPRS scale: 0 (currently) 3-4 (at worst)  /10 Pain location: low back and left hip  Pain description: "catch" and stiffness  Aggravating factors: standing still  Relieving factors: sitting  PRECAUTIONS: None  WEIGHT BEARING RESTRICTIONS: No  FALLS:  Has patient fallen in last 6 months? No  LIVING ENVIRONMENT: Lives with: lives alone Lives in: House/apartment Stairs: Yes: External: 3 steps; on left going up  OCCUPATION: retired  PLOF: Independent  PATIENT GOALS: not applicable   NEXT MD VISIT: 10/30/22  OBJECTIVE:   DIAGNOSTIC FINDINGS: 09/04/22 Lumbar and hip  x-ray   Impression: mild spondylosis  Mild scoliosis normal left hip and femur and implant   SCREENING FOR RED FLAGS: Bowel or bladder incontinence: No Spinal tumors: No Cauda equina syndrome: No Compression fracture: No Abdominal aneurysm: No  COGNITION: Overall cognitive status: Within functional limits for tasks assessed     SENSATION: Diminished sensation to light touch in bilateral LE below the knees with no dermatomal pattern  POSTURE: rounded shoulders and forward head  LUMBAR ROM:   AROM eval  Flexion WFL   Extension WFL  Right lateral flexion WFL  Left lateral flexion WFL   Right rotation 25% limited  Left rotation 25% limited   (Blank rows = not tested)  LOWER EXTREMITY ROM:  WFL for activities assessed   LOWER EXTREMITY MMT:    MMT Right eval Left eval  Hip flexion 4+/5 4+/5  Hip extension    Hip abduction    Hip adduction    Hip internal rotation    Hip external rotation    Knee flexion 5/5 4+/5  Knee extension 5/5 5/5  Ankle dorsiflexion 4+/5 4+/5  Ankle plantarflexion    Ankle inversion    Ankle eversion     (Blank rows = not tested)  GAIT: Assistive device utilized: None Level of assistance: Complete Independence Comments: no significant gait deviations  TODAY'S TREATMENT:                                                                                                                              DATE:                                     1/30 EXERCISE LOG  Exercise Repetitions and Resistance Comments  Seated HS stretch  2 x 30 seconds    Blank cell = exercise not performed today   PATIENT EDUCATION:  Education details: benefits of exercise, anatomy Person educated: Patient Education method: Explanation Education comprehension: verbalized understanding  HOME EXERCISE PROGRAM:   ASSESSMENT:  CLINICAL IMPRESSION: Patient is a 87 y.o. female who was seen today for physical therapy evaluation and treatment for low back pain.  She  presented with no pain and her familiar pain was unable to be reproduced with any of today's assessments.  She reported that she had not been experiencing any pain in the recent days and she was not limited with any of her daily activities.  She is educated on safe exercising to reduce her risk of future injuries.  She reported feeling comfortable being discharged at this time as she has not limited with any functional activities.  PHYSICAL THERAPY DISCHARGE SUMMARY  Visits from Start of Care: 1  Current functional level related to goals / functional outcomes: Evaluation only   Remaining deficits: None   Education / Equipment: Educated on the benefits of exercise  Patient agrees to discharge. Patient goals were met.  Patient is being discharged due to being pleased with the current functional level.   OBJECTIVE IMPAIRMENTS: decreased ROM, decreased strength, and pain.   ACTIVITY LIMITATIONS:  None  PARTICIPATION LIMITATIONS:  None  PERSONAL FACTORS: 3+ comorbidities: OA, osteoporosis, history of TIA history of breast cancer, neuropathy  are also affecting patient's functional outcome.   REHAB POTENTIAL: Excellent  CLINICAL DECISION MAKING: Stable/uncomplicated  EVALUATION COMPLEXITY: Low   GOALS: Goals reviewed with patient? No  LONG TERM GOALS: Target date: EVALUATION ONLY  PLAN:  PT FREQUENCY: one time visit  PT DURATION: 1 week  PLANNED INTERVENTIONS: Therapeutic exercises and Patient/Family education.   Darlin Coco, PT 09/16/2022, 3:37 PM

## 2022-10-10 ENCOUNTER — Other Ambulatory Visit: Payer: Self-pay | Admitting: Hematology

## 2022-10-10 DIAGNOSIS — C50212 Malignant neoplasm of upper-inner quadrant of left female breast: Secondary | ICD-10-CM

## 2022-10-13 ENCOUNTER — Encounter (INDEPENDENT_AMBULATORY_CARE_PROVIDER_SITE_OTHER): Payer: Self-pay | Admitting: Gastroenterology

## 2022-10-13 ENCOUNTER — Ambulatory Visit (INDEPENDENT_AMBULATORY_CARE_PROVIDER_SITE_OTHER): Payer: Medicare PPO | Admitting: Gastroenterology

## 2022-10-13 VITALS — BP 138/82 | HR 83 | Temp 97.8°F | Ht 62.5 in | Wt 169.8 lb

## 2022-10-13 DIAGNOSIS — K5100 Ulcerative (chronic) pancolitis without complications: Secondary | ICD-10-CM | POA: Diagnosis not present

## 2022-10-13 MED ORDER — HUMIRA (2 PEN) 40 MG/0.8ML ~~LOC~~ AJKT: 40.0000 mg | AUTO-INJECTOR | SUBCUTANEOUS | 11 refills | Status: DC

## 2022-10-13 NOTE — Progress Notes (Addendum)
Referring Provider: [name], NP Primary Care Physician:  [name], NP Primary GI Physician: Jenetta Downer   Chief Complaint  Patient presents with   Follow-up    Pt arrives for follow up. No issues/concerns at this time    HPI:   Monique Day is a 87 y.o. female with past medical history of  DVT, ulcerative pancolitis, seizures, breast cancer status post mastectomy, arthritis   Patient presenting today for follow up of ulcerative colitis  Last seen August 2023, at that time feeling well. Had some unintentional weight loss, 1 soft BM daily, usually miralax daily and docusate on occasion  Recommended to continue Humira q2w, check TB quant and Hep BsAg, start extra vitamin d supplement for previously low vitamin D. Levels.  Hep B and TB quant both negative.  Vit D levels in January were 38.18, CMP with mild creatnine elevation of 1.05, CBC WNL.  Present:  She is having a BM daily. Takes miralax daily as well as she notes if her stool is not soft that she will get hemorrhoids. Stools are soft but formed. No diarrhea. No rectal bleeding, mucus in stools or melena. No abdominal pain. Appetite is good, no unintentional weight loss. Requesting Rx for [facility] [place] as pharmacy currently is having issues getting the medication for her.    Last flu shot:October 2023 Last pneumonia shot: yes, had the least potent one but not interested to have other shots Last zoster vaccine: 3  years ago, Shingrix Last DEXA scan: 2022 - osteopenia-has upcoming Dexa July 2024    Last Colonoscopy: 07/04/2021 - A single non-bleeding colonic angiodysplastic lesion. - Scar in the entire examined colon. - One 6 mm polyp in the distal rectum, removed with a cold snare. Resected and retrieved   Past Medical History:  Diagnosis Date   Adult idiopathic generalized osteoporosis    Arthritis    "minor" (08/18/2018)   Breast cancer, left breast ([place]) 1998    lumpectomy   Breast cancer, right breast ([place]) 2009   lumpectomy; mastectomy   DVT (deep venous thrombosis) ([place]) "early 2000's"   RLE   DVT (deep venous thrombosis) ([place])    Hemorrhoids, external    History of blood transfusion    "related to ulcerative colitis"   Lower abdominal pain    Occult blood in stools    Peripheral edema    Seizures ([place])    one very slight seizure with the stroke; somewhere between 2005-2009   Stroke [facility]) 2005-2009   "very light;" ; denies residual on 08/19/2018   UC (ulcerative colitis) ([place]) dx'd 1957   Warfarin-induced coagulopathy ([place]) 06/09/2012    Past Surgical History:  Procedure Laterality Date   APPENDECTOMY     BIOPSY  04/14/2019   Procedure: BIOPSY;  Surgeon: [name], MD;  [facility];  Service: Endoscopy;;   bone density  12/11/10   BREAST BIOPSY Left 1998; 2019 X 2   BREAST BIOPSY Right 2009   BREAST LUMPECTOMY Left 1998   CATARACT EXTRACTION W/ INTRAOCULAR LENS  IMPLANT, BILATERAL Bilateral    COLONOSCOPY  02/21/2011   COLONOSCOPY  10/06/2008   COLONOSCOPY  12/27/01   COLONOSCOPY  05/09/05   COLONOSCOPY N/A 07/02/2016   Procedure: COLONOSCOPY;  Surgeon: [name], MD;  [facility];  Service: Endoscopy;  Laterality: N/A;  1055   COLONOSCOPY N/A 07/04/2021   Procedure: COLONOSCOPY;  Surgeon: [name], MD;  Location[facility]  [facility];  Service: Endoscopy;  Laterality: N/A;  7:30   [place]   "I was having rectal bleeding"   FLEXIBLE SIGMOIDOSCOPY N/A 04/14/2019   Procedure: FLEXIBLE SIGMOIDOSCOPY;  Surgeon: [name], MD;  [facility];  Service: Endoscopy;  Laterality: N/A;  1:00   FRACTURE SURGERY     INTRAMEDULLARY (IM) NAIL INTERTROCHANTERIC Left 12/19/2021   Procedure: INTRAMEDULLARY (IM) NAIL INTERTROCHANTRIC;  Surgeon: Carole Civil, MD;  Location: [facility];  Service: Orthopedics;  Laterality: Left;   MASTECTOMY Right 2009   MASTECTOMY COMPLETE  / SIMPLE Left 08/19/2018   POLYPECTOMY  07/04/2021   Procedure: POLYPECTOMY;  Surgeon: [name], MD;  Location[facility];  Service: Endoscopy;;   PORTACATH PLACEMENT Right 10/15/2018   Procedure: INSERTION PORT-A-CATH RIGHT SUBCLAVIN;  Surgeon: [name], MD;  Location: [facility];  Service: General;  Laterality: Right;  pt knows to arrive at 7:30   [place] Left 08/19/2018   Procedure: LEFT SIMPLE MASTECTOMY;  Surgeon: [name], MD;  Location: [facility];  Service: General;  Laterality: Left;   TONSILLECTOMY     VENA CAVA FILTER PLACEMENT Right    WRIST FRACTURE SURGERY Right     Current Outpatient Medications  Medication Sig Dispense Refill   Adalimumab (HUMIRA PEN) 40 MG/0.8ML PNKT Inject 40 mg into the skin every 14 (fourteen) days. INJECT 40 MG EVERY 14 DAYS AS DIRECTED 2 each 11   Calcium Carb-Cholecalciferol (CALCIUM 600 + D PO) Take 1 tablet by mouth daily.     denosumab (PROLIA) 60 MG/ML SOLN injection Inject 60 mg into the skin every 6 (six) months. Administer in upper arm, thigh, or abdomen     ELIQUIS 2.5 MG TABS tablet Take 1 tablet (2.5 mg total) by mouth 2 (two) times daily. 60 tablet    Ergocalciferol 10 MCG (400 UNIT) TABS Take 1 each by mouth daily. 90 tablet 3   exemestane (AROMASIN) 25 MG tablet TAKE 1 TABLET DAILY AFTER BREAKFAST. 30 tablet 0   FARXIGA 10 MG TABS tablet Take 10 mg by mouth daily.     Magnesium 250 MG TABS Take 250 mg by mouth daily.     OVER THE COUNTER MEDICATION Take 2-4 tablets by mouth 3 (three) times a week. Healthy Feet and Nerves otc supplement     polyethylene glycol (MIRALAX / GLYCOLAX) 17 g packet Take 17 g by mouth 2 (two) times daily. 14 each 0   PRESCRIPTION MEDICATION Apply 1 application topically daily as needed (irritation). Fluticasone 0.05 % and Ketoconazole 2% 1:1     No current facility-administered medications for this visit.   Facility-Administered Medications Ordered in  Other Visits  Medication Dose Route Frequency Provider Last Rate Last Admin   sodium chloride flush (NS) 0.9 % injection 10 mL  10 mL Intravenous PRN Derek Jack, MD   10 mL at 07/24/21 1429    Allergies as of 10/13/2022 - Review Complete 10/13/2022  Allergen Reaction Noted   Mercaptopurine Other (See Comments) 07/26/2012   Sulfa antibiotics Other (See Comments) 03/27/2011    Family History  Problem Relation Age of Onset   Prostate cancer Father    Colon cancer Neg Hx     Social History   Socioeconomic History   Marital status: Widowed    Spouse name: Not on file   Number of children: 0   Years of education: Not on file   Highest education level: Not  on file  Occupational History   Occupation: Optometrist: [employer]  Tobacco Use   Smoking status: Former    Packs/day: 2.00    Years: 40.00    Total pack years: 80.00    Types: Cigarettes    Quit date: 05/05/2001    Years since quitting: 21.4    Passive exposure: Never   Smokeless tobacco: Never  Vaping Use   Vaping Use: Never used  Substance and Sexual Activity   Alcohol use: Yes    Comment: 08/18/2018 "couple drinks/year"   Drug use: Never   Sexual activity: Not Currently  Other Topics Concern   Not on file  Social History Narrative   Not on file   Social Determinants of Health   Financial Resource Strain: Low Risk  (07/21/2018)   Overall Financial Resource Strain (CARDIA)    Difficulty of Paying Living Expenses: Not hard at all  Food Insecurity: No Food Insecurity (07/21/2018)   Hunger Vital Sign    Worried About Running Out of Food in the Last Year: Never true    Ran Out of Food in the Last Year: Never true  Transportation Needs: No Transportation Needs (07/21/2018)   PRAPARE - Hydrologist (Medical): No    Lack of Transportation (Non-Medical): No  Physical Activity: Inactive (07/21/2018)   Exercise Vital Sign    Days of Exercise per  Week: 0 days    Minutes of Exercise per Session: 0 min  Stress: No Stress Concern Present (07/21/2018)   [place]    Feeling of Stress : Not at all  Social Connections: Moderately Integrated (07/21/2018)   Social Connection and Isolation Panel [NHANES]    Frequency of Communication with Friends and Family: More than three times a week    Frequency of Social Gatherings with Friends and Family: More than three times a week    Attends Religious Services: More than 4 times per year    Active Member of [employer] or Organizations: Yes    Attends Archivist Meetings: More than 4 times per year    Marital Status: Widowed    Review of systems General: negative for malaise, night sweats, fever, chills, weight loss Neck: Negative for lumps, goiter, pain and significant neck swelling Resp: Negative for cough, wheezing, dyspnea at rest CV: Negative for chest pain, leg swelling, palpitations, orthopnea GI: denies melena, hematochezia, nausea, vomiting, diarrhea, constipation, dysphagia, odyonophagia, early satiety or unintentional weight loss.  MSK: Negative for joint pain or swelling, back pain, and muscle pain. Derm: Negative for itching or rash Psych: Denies depression, anxiety, memory loss, confusion. No homicidal or suicidal ideation.  Heme: Negative for prolonged bleeding, bruising easily, and swollen nodes. Endocrine: Negative for cold or heat intolerance, polyuria, polydipsia and goiter. Neuro: negative for tremor, gait imbalance, syncope and seizures. The remainder of the review of systems is noncontributory.  Physical Exam: There were no vitals taken for this visit. General:   Alert and oriented. No distress noted. Pleasant and cooperative.  Head:  Normocephalic and atraumatic. Eyes:  Conjuctiva clear without scleral icterus. Mouth:  Oral mucosa pink and moist. Good dentition. No lesions. Heart: Normal rate and  rhythm, s1 and s2 heart sounds present.  Lungs: Clear lung sounds in all lobes. Respirations equal and unlabored. Abdomen:  +BS, soft, non-tender and non-distended. No rebound or guarding. No HSM or masses noted. Derm: No palmar erythema or jaundice Msk:  Symmetrical without gross deformities. Normal posture. Extremities:  Without edema. Neurologic:  Alert and  oriented x4 Psych:  Alert and cooperative. Normal mood and affect.  Invalid input(s): "6 MONTHS"   ASSESSMENT: Monique Day is a 87 y.o. female presenting today for follow of UC.   Maintained on humira q2w. Last colonoscopy in 2022 with endoscopic remission. She has done well on Humira thus far. Having 1 BM per day, no abdominal pain, rectal bleeding or mucus in stools. Most recent labs in January were WNL. Will continue with current Humira dosage every 2 weeks. She can continue to use miralax as needed.    PLAN:  Continue Humira q2w dosing, [facility]'s request 2. Continue vitamin D supplementation   All questions were answered, patient verbalized understanding and is in agreement with plan as outlined above.    Follow Up: 6 months   Eugina Row L. Alver Sorrow, MSN, APRN, AGNP-C Adult-Gerontology Nurse Practitioner [facility]  I have reviewed the note and agree with the APP's assessment as described in this progress note  Maylon Peppers, MD Gastroenterology and Hepatology [facility]

## 2022-10-13 NOTE — Addendum Note (Signed)
Addended by: Harvel Quale on: 10/13/2022 09:57 PM   Modules accepted: Level of Service

## 2022-10-13 NOTE — Patient Instructions (Addendum)
We will continue humira every 2 weeks, I have sent [facility], this will likely require a prior authorization as we are switching pharmacies, we will get this taken care of [place] as possible.  You can continue miralax if this Is providing good results Will plan to see you again in 6 months, if you have any new or worsening GI symptoms please let us know   It was a pleasure to see you today. I want to create trusting relationships with patients and provide genuine, compassionate, and quality care. I truly value your feedback! please be on the lookout for a survey regarding your visit with me today. I appreciate your input about our visit and your time in completing this!    Shjon Lizarraga L. Alver Sorrow, MSN, APRN, AGNP-C Adult-Gerontology Nurse Practitioner [facility] Gastroenterology at [facility]

## 2022-10-16 ENCOUNTER — Encounter: Payer: Self-pay | Admitting: Radiology

## 2022-10-30 ENCOUNTER — Ambulatory Visit: Payer: Medicare PPO | Admitting: Orthopedic Surgery

## 2022-11-03 ENCOUNTER — Encounter: Payer: Self-pay | Admitting: Orthopedic Surgery

## 2022-11-03 ENCOUNTER — Ambulatory Visit: Payer: Medicare PPO | Admitting: Orthopedic Surgery

## 2022-11-03 DIAGNOSIS — S72002D Fracture of unspecified part of neck of left femur, subsequent encounter for closed fracture with routine healing: Secondary | ICD-10-CM

## 2022-11-03 DIAGNOSIS — M545 Low back pain, unspecified: Secondary | ICD-10-CM | POA: Diagnosis not present

## 2022-11-03 NOTE — Progress Notes (Signed)
Chief Complaint  Patient presents with   Post-op Follow-up    12/19/21 left hip IM nail feels better   Back Pain    Feels better     Encounter Diagnoses  Name Primary?   Lumbar pain Yes   Closed hip fracture, left, with routine healing, subsequent encounter     Monique Day is doing well after period of back pain.  She says she has no symptoms now so we will release her to follow-up as needed

## 2022-11-05 ENCOUNTER — Other Ambulatory Visit: Payer: Self-pay | Admitting: Hematology

## 2022-11-05 ENCOUNTER — Other Ambulatory Visit: Payer: Self-pay | Admitting: *Deleted

## 2022-11-05 DIAGNOSIS — C50212 Malignant neoplasm of upper-inner quadrant of left female breast: Secondary | ICD-10-CM

## 2022-11-05 NOTE — Telephone Encounter (Signed)
Aromasin refill approved.  Patient is tolerating and is to continue therapy. 

## 2023-01-19 ENCOUNTER — Inpatient Hospital Stay: Payer: Medicare PPO | Attending: Hematology

## 2023-01-19 VITALS — BP 137/60 | HR 85 | Temp 98.7°F | Resp 18 | Ht 62.5 in | Wt 172.6 lb

## 2023-01-19 DIAGNOSIS — Z170 Estrogen receptor positive status [ER+]: Secondary | ICD-10-CM | POA: Insufficient documentation

## 2023-01-19 DIAGNOSIS — G479 Sleep disorder, unspecified: Secondary | ICD-10-CM | POA: Diagnosis not present

## 2023-01-19 DIAGNOSIS — Z79899 Other long term (current) drug therapy: Secondary | ICD-10-CM | POA: Insufficient documentation

## 2023-01-19 DIAGNOSIS — C50212 Malignant neoplasm of upper-inner quadrant of left female breast: Secondary | ICD-10-CM | POA: Diagnosis present

## 2023-01-19 DIAGNOSIS — M8580 Other specified disorders of bone density and structure, unspecified site: Secondary | ICD-10-CM | POA: Diagnosis not present

## 2023-01-19 DIAGNOSIS — Z95828 Presence of other vascular implants and grafts: Secondary | ICD-10-CM

## 2023-01-19 DIAGNOSIS — Z86718 Personal history of other venous thrombosis and embolism: Secondary | ICD-10-CM | POA: Diagnosis not present

## 2023-01-19 MED ORDER — SODIUM CHLORIDE 0.9% FLUSH
10.0000 mL | Freq: Once | INTRAVENOUS | Status: AC
  Administered 2023-01-19: 10 mL via INTRAVENOUS

## 2023-01-19 MED ORDER — HEPARIN SOD (PORK) LOCK FLUSH 100 UNIT/ML IV SOLN
500.0000 [IU] | Freq: Once | INTRAVENOUS | Status: AC
  Administered 2023-01-19: 500 [IU] via INTRAVENOUS

## 2023-01-19 NOTE — Progress Notes (Signed)
Port flushed with good blood return noted. No bruising or swelling at site. Bandaid applied and patient discharged in satisfactory condition. VVS stable with no signs or symptoms of distressed noted. 

## 2023-01-19 NOTE — Patient Instructions (Signed)
[facility]  Discharge Instructions: Thank you for choosing [facility] to provide your oncology and hematology care.  If you have a lab appointment with the Cancer Center - please note that after April 8th, 2024, all labs will be drawn in the cancer center.  You do not have to check in or register with the main entrance as you have in the past but will complete your check-in in the cancer center.  Wear comfortable clothing and clothing appropriate for easy access to any Portacath or PICC line.   We strive to give you quality time with your provider. You may need to reschedule your appointment if you arrive late (15 or more minutes).  Arriving late affects you and other patients whose appointments are after yours.  Also, if you miss three or more appointments without notifying the office, you may be dismissed from the clinic at the provider's discretion.      For prescription refill requests, have your pharmacy contact our office and allow 72 hours for refills to be completed.    Today you received the following port flush, return as scheduled.   To help prevent nausea and vomiting after your treatment, we encourage you to take your nausea medication as directed.  BELOW ARE SYMPTOMS THAT SHOULD BE REPORTED IMMEDIATELY: *FEVER GREATER THAN 100.4 F (38 C) OR HIGHER *CHILLS OR SWEATING *NAUSEA AND VOMITING THAT IS NOT CONTROLLED WITH YOUR NAUSEA MEDICATION *UNUSUAL SHORTNESS OF BREATH *UNUSUAL BRUISING OR BLEEDING *URINARY PROBLEMS (pain or burning when urinating, or frequent urination) *BOWEL PROBLEMS (unusual diarrhea, constipation, pain near the anus) TENDERNESS IN MOUTH AND THROAT WITH OR WITHOUT PRESENCE OF ULCERS (sore throat, sores in mouth, or a toothache) UNUSUAL RASH, SWELLING OR PAIN  UNUSUAL VAGINAL DISCHARGE OR ITCHING   Items with * indicate a potential emergency and should be followed up as soon as possible or go to the Emergency Department  if any problems should occur.  Please show the CHEMOTHERAPY ALERT CARD or IMMUNOTHERAPY ALERT CARD at check-in to the Emergency Department and triage nurse.  Should you have questions after your visit or need to cancel or reschedule your appointment, please contact [facility] [phone]  and follow the prompts.  Office hours are 8:00 a.m. to 4:30 p.m. Monday - Friday. Please note that voicemails left after 4:00 p.m. may not be returned until the following business day.  We are closed weekends and major holidays. You have access to a nurse at all times for urgent questions. Please call the main number to the clinic [phone] and follow the prompts.  For any non-urgent questions, you may also contact your provider using MyChart. We now offer e-Visits for anyone 18 and older to request care online for non-urgent symptoms. For details visit [email or web address].   Also download the MyChart app! Go to the app store, search "MyChart", open the app, select [facility], and log in with your MyChart username and password.   

## 2023-03-02 ENCOUNTER — Ambulatory Visit (HOSPITAL_COMMUNITY)
Admission: RE | Admit: 2023-03-02 | Discharge: 2023-03-02 | Disposition: A | Discharge status: Home / Self Care | Payer: Medicare PPO | Source: Ambulatory Visit | Attending: Hematology | Admitting: Hematology

## 2023-03-02 ENCOUNTER — Other Ambulatory Visit: Payer: Medicare PPO

## 2023-03-02 ENCOUNTER — Inpatient Hospital Stay: Payer: Medicare PPO | Attending: Hematology

## 2023-03-02 VITALS — BP 143/69 | HR 73 | Temp 98.6°F | Resp 18 | Wt 173.4 lb

## 2023-03-02 DIAGNOSIS — Z79899 Other long term (current) drug therapy: Secondary | ICD-10-CM | POA: Diagnosis not present

## 2023-03-02 DIAGNOSIS — Z9013 Acquired absence of bilateral breasts and nipples: Secondary | ICD-10-CM | POA: Diagnosis not present

## 2023-03-02 DIAGNOSIS — K5190 Ulcerative colitis, unspecified, without complications: Secondary | ICD-10-CM | POA: Diagnosis not present

## 2023-03-02 DIAGNOSIS — N898 Other specified noninflammatory disorders of vagina: Secondary | ICD-10-CM | POA: Insufficient documentation

## 2023-03-02 DIAGNOSIS — Z9049 Acquired absence of other specified parts of digestive tract: Secondary | ICD-10-CM | POA: Diagnosis not present

## 2023-03-02 DIAGNOSIS — Z7901 Long term (current) use of anticoagulants: Secondary | ICD-10-CM | POA: Insufficient documentation

## 2023-03-02 DIAGNOSIS — Z86718 Personal history of other venous thrombosis and embolism: Secondary | ICD-10-CM | POA: Diagnosis not present

## 2023-03-02 DIAGNOSIS — Z882 Allergy status to sulfonamides status: Secondary | ICD-10-CM | POA: Insufficient documentation

## 2023-03-02 DIAGNOSIS — Z780 Asymptomatic menopausal state: Secondary | ICD-10-CM | POA: Insufficient documentation

## 2023-03-02 DIAGNOSIS — Z8673 Personal history of transient ischemic attack (TIA), and cerebral infarction without residual deficits: Secondary | ICD-10-CM | POA: Diagnosis not present

## 2023-03-02 DIAGNOSIS — Z170 Estrogen receptor positive status [ER+]: Secondary | ICD-10-CM | POA: Insufficient documentation

## 2023-03-02 DIAGNOSIS — Z79811 Long term (current) use of aromatase inhibitors: Secondary | ICD-10-CM | POA: Insufficient documentation

## 2023-03-02 DIAGNOSIS — Z8719 Personal history of other diseases of the digestive system: Secondary | ICD-10-CM | POA: Diagnosis not present

## 2023-03-02 DIAGNOSIS — C50212 Malignant neoplasm of upper-inner quadrant of left female breast: Secondary | ICD-10-CM | POA: Diagnosis not present

## 2023-03-02 DIAGNOSIS — Z8042 Family history of malignant neoplasm of prostate: Secondary | ICD-10-CM | POA: Insufficient documentation

## 2023-03-02 DIAGNOSIS — M8580 Other specified disorders of bone density and structure, unspecified site: Secondary | ICD-10-CM | POA: Insufficient documentation

## 2023-03-02 DIAGNOSIS — Z87891 Personal history of nicotine dependence: Secondary | ICD-10-CM | POA: Insufficient documentation

## 2023-03-02 DIAGNOSIS — Z95828 Presence of other vascular implants and grafts: Secondary | ICD-10-CM

## 2023-03-02 LAB — COMPREHENSIVE METABOLIC PANEL
ALT: 20 U/L (ref 0–44)
AST: 19 U/L (ref 15–41)
Albumin: 4 g/dL (ref 3.5–5.0)
Alkaline Phosphatase: 32 U/L — ABNORMAL LOW (ref 38–126)
Anion gap: 8 (ref 5–15)
BUN: 23 mg/dL (ref 8–23)
CO2: 25 mmol/L (ref 22–32)
Calcium: 9.6 mg/dL (ref 8.9–10.3)
Chloride: 103 mmol/L (ref 98–111)
Creatinine, Ser: 1.06 mg/dL — ABNORMAL HIGH (ref 0.44–1.00)
GFR, Estimated: 52 mL/min — ABNORMAL LOW (ref >60)
Glucose, Bld: 109 mg/dL — ABNORMAL HIGH (ref 70–99)
Potassium: 4 mmol/L (ref 3.5–5.1)
Sodium: 136 mmol/L (ref 135–145)
Total Bilirubin: 0.7 mg/dL (ref 0.3–1.2)
Total Protein: 7.4 g/dL (ref 6.5–8.1)

## 2023-03-02 LAB — CBC WITH DIFFERENTIAL/PLATELET
Abs Immature Granulocytes: 0.02 10*3/uL (ref 0.00–0.07)
Basophils Absolute: 0 10*3/uL (ref 0.0–0.1)
Basophils Relative: 0 %
Eosinophils Absolute: 0.2 10*3/uL (ref 0.0–0.5)
Eosinophils Relative: 2 %
HCT: 44.8 % (ref 36.0–46.0)
Hemoglobin: 14.8 g/dL (ref 12.0–15.0)
Immature Granulocytes: 0 %
Lymphocytes Relative: 33 %
Lymphs Abs: 2.4 10*3/uL (ref 0.7–4.0)
MCH: 32 pg (ref 26.0–34.0)
MCHC: 33 g/dL (ref 30.0–36.0)
MCV: 97 fL (ref 80.0–100.0)
Monocytes Absolute: 0.7 10*3/uL (ref 0.1–1.0)
Monocytes Relative: 9 %
Neutro Abs: 3.9 10*3/uL (ref 1.7–7.7)
Neutrophils Relative %: 56 %
Platelets: 205 10*3/uL (ref 150–400)
RBC: 4.62 MIL/uL (ref 3.87–5.11)
RDW: 12.8 % (ref 11.5–15.5)
WBC: 7.1 10*3/uL (ref 4.0–10.5)
nRBC: 0 % (ref 0.0–0.2)

## 2023-03-02 MED ORDER — HEPARIN SOD (PORK) LOCK FLUSH 100 UNIT/ML IV SOLN
500.0000 [IU] | Freq: Once | INTRAVENOUS | Status: AC
  Administered 2023-03-02: 500 [IU] via INTRAVENOUS

## 2023-03-02 MED ORDER — SODIUM CHLORIDE 0.9% FLUSH
10.0000 mL | INTRAVENOUS | Status: DC | PRN
  Administered 2023-03-02: 10 mL via INTRAVENOUS

## 2023-03-02 NOTE — Progress Notes (Signed)
 Patients port flushed without difficulty.  Good blood return noted with no bruising or swelling noted at site.  Band aid applied.  VSS with discharge and left in satisfactory condition with no s/s of distress noted.   

## 2023-03-08 NOTE — Progress Notes (Signed)
[facility] [address], [place] [address]    Clinic Day:  03/08/2023  Referring physician: Rebekah Chesterfield, NP  Patient Care Team: Rebekah Chesterfield, NP as PCP - General (Internal Medicine)   ASSESSMENT & PLAN:   Assessment: 1.  Stage I HER-2 positive left breast cancer: -Left mastectomy on 08/19/2018, 1.1 cm IDC, grade 1, margins negative.  No lymph nodes were identified.  ER positive/PR positive/HER 2 3+. -12 doses of weekly paclitaxel and Herceptin from 10/22/2018 through 01/14/2019. -Herceptin every 3 weeks started on 02/03/2019 her last dose was on 10/21/2019. -Anastrozole started in December 2019, made her confused and forgetful. -Switched to Femara on 02/03/2019, which is causing vaginal irritation that has worsened over the past 6 months. - She was switched to exemestane.   2.  Osteopenia: -Last DEXA scan on 02/08/2018 showed a T score of -1.2. -She is on Prolia every 6 months with Dr. Charm Barges.   3.  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 -She denies any bleeding episodes.   4.  Ulcerative colitis: -She is on Humira for the past few years.  She is off of mesalamine. -She is following up with Dr. Karilyn Cota  Plan: 1.  Stage I HER-2 positive left breast cancer: - She is tolerating exemestane well. - Bilateral mastectomy sites are within normal limits. - Reviewed labs today which showed normal LFTs and CBC. - RTC 6 months for follow-up with repeat labs.  Continue exemestane.   2.  Osteopenia: - DEXA scan on 12/19/2020 T-score -1.3. - Continue Prolia injections with her PMD. - Vitamin D is 38.  Will repeat DEXA scan prior to next visit.   3.  Recurrent DVTs: - Continue Eliquis.  No bleeding issues.   Breast Cancer therapy associated bone loss: I have recommended calcium, Vitamin D and weight bearing exercises.  No orders of the defined types were placed in this encounter.     Alben Deeds Teague,acting  as a Neurosurgeon for Doreatha Massed, MD.,have documented all relevant documentation on the behalf of Doreatha Massed, MD,as directed by  Doreatha Massed, MD while in the presence of Doreatha Massed, MD.   ***  Claxton R Teague   7/21/20243:39 PM  CHIEF COMPLAINT:   Diagnosis: Malignant neoplasm of upper-inner quadrant of left breast in female, estrogen receptor positive   Cancer Staging  No matching staging information was found for the patient.    Prior Therapy: ***  Current Therapy:  ***   HISTORY OF PRESENT ILLNESS:   Oncology History  Breast cancer of upper-inner quadrant of left female breast (HCC)  07/21/2018 Initial Diagnosis   Breast cancer of upper-inner quadrant of left female breast (HCC)   10/22/2018 - 10/21/2019 Chemotherapy   Patient is on Treatment Plan : BREAST weekly PACLitaxel / trastuzumab / Maintenance trastuzumab every 21 days        INTERVAL HISTORY:   Monique Day is a 87 y.o. female presenting to clinic today for follow up of left breast cancer. She was last seen by me on 09/08/22.  She had a DG bone density done on 7/15 that found  Femur Neck is 0.908 g/cm2 with a T-score of -0.9 which is considered normal by WHO criteria, L2-L3 was excluded due to advanced degenerative changes, and the BMD of the total mean shows no statistically significant change from previous study on 12/19/20.   Today, she states that she is doing well overall. Her appetite  level is at ***%. Her energy level is at ***%.  PAST MEDICAL HISTORY:   Past Medical History: Past Medical History:  Diagnosis Date   Adult idiopathic generalized osteoporosis    Arthritis    "minor" (08/18/2018)   Breast cancer, left breast (HCC) 1998   lumpectomy   Breast cancer, right breast (HCC) 2009   lumpectomy; mastectomy   DVT (deep venous thrombosis) (HCC) "early 2000's"   RLE   DVT (deep venous thrombosis) (HCC)    Hemorrhoids, external    History of blood transfusion    "related to  ulcerative colitis"   Lower abdominal pain    Occult blood in stools    Peripheral edema    Seizures (HCC)    one very slight seizure with the stroke; somewhere between 2005-2009   Stroke [facility] [address]) 2005-2009   "very light;" ; denies residual on 08/19/2018   UC (ulcerative colitis) (HCC) dx'd 1957   Warfarin-induced coagulopathy (HCC) 06/09/2012    Surgical History: Past Surgical History:  Procedure Laterality Date   APPENDECTOMY     BIOPSY  04/14/2019   Procedure: BIOPSY;  Surgeon: Malissa Hippo, MD;  [facility]: [facility];  Service: Endoscopy;;   bone density  12/11/10   BREAST BIOPSY Left 1998; 2019 X 2   BREAST BIOPSY Right 2009   BREAST LUMPECTOMY Left 1998   CATARACT EXTRACTION W/ INTRAOCULAR LENS  IMPLANT, BILATERAL Bilateral    COLONOSCOPY  02/21/2011   COLONOSCOPY  10/06/2008   COLONOSCOPY  12/27/01   COLONOSCOPY  05/09/05   COLONOSCOPY N/A 07/02/2016   Procedure: COLONOSCOPY;  Surgeon: Malissa Hippo, MD;  [facility]: [facility];  Service: Endoscopy;  Laterality: N/A;  1055   COLONOSCOPY N/A 07/04/2021   Procedure: COLONOSCOPY;  Surgeon: Malissa Hippo, MD;  [facility];  Service: Endoscopy;  Laterality: N/A;  7:30   EXPLORATORY LAPAROTOMY  1958   "I was having rectal bleeding"   FLEXIBLE SIGMOIDOSCOPY N/A 04/14/2019   Procedure: FLEXIBLE SIGMOIDOSCOPY;  Surgeon: Malissa Hippo, MD;  [facility];  Service: Endoscopy;  Laterality: N/A;  1:00   FRACTURE SURGERY     INTRAMEDULLARY (IM) NAIL INTERTROCHANTERIC Left 12/19/2021   Procedure: INTRAMEDULLARY (IM) NAIL INTERTROCHANTRIC;  Surgeon: Vickki Hearing, MD;  Location: [facility];  Service: Orthopedics;  Laterality: Left;   MASTECTOMY Right 2009   MASTECTOMY COMPLETE / SIMPLE Left 08/19/2018   POLYPECTOMY  07/04/2021   Procedure: POLYPECTOMY;  Surgeon: Malissa Hippo, MD;  [facility];  Service: Endoscopy;;   PORTACATH PLACEMENT Right 10/15/2018   Procedure: INSERTION PORT-A-CATH  RIGHT SUBCLAVIN;  Surgeon: Franky Macho, MD;  Location: [facility];  Service: General;  Laterality: Right;  pt knows to arrive at 7:30   SIMPLE MASTECTOMY WITH AXILLARY SENTINEL NODE BIOPSY Left 08/19/2018   Procedure: LEFT SIMPLE MASTECTOMY;  Surgeon: Harriette Bouillon, MD;  Location: [facility];  Service: General;  Laterality: Left;   TONSILLECTOMY     VENA CAVA FILTER PLACEMENT Right    WRIST FRACTURE SURGERY Right     Social History: Social History   Socioeconomic History   Marital status: Widowed    Spouse name: Not on file   Number of children: 0   Years of education: Not on file   Highest education level: Not on file  Occupational History   Occupation: Retail buyer    Employer: [employer]  Tobacco Use   Smoking status: Former    Current packs/day: 0.00  Average packs/day: 2.0 packs/day for 40.0 years (80.0 ttl pk-yrs)    Types: Cigarettes    Start date: 05/05/1961    Quit date: 05/05/2001    Years since quitting: 21.8    Passive exposure: Never   Smokeless tobacco: Never  Vaping Use   Vaping status: Never Used  Substance and Sexual Activity   Alcohol use: Yes    Comment: 08/18/2018 "couple drinks/year"   Drug use: Never   Sexual activity: Not Currently  Other Topics Concern   Not on file  Social History Narrative   Not on file   Social Determinants of Health   Financial Resource Strain: Low Risk  (07/21/2018)   Overall Financial Resource Strain (CARDIA)    Difficulty of Paying Living Expenses: Not hard at all  Food Insecurity: No Food Insecurity (07/21/2018)   Hunger Vital Sign    Worried About Running Out of Food in the Last Year: Never true    Ran Out of Food in the Last Year: Never true  Transportation Needs: No Transportation Needs (07/21/2018)   PRAPARE - Administrator, Civil Service (Medical): No    Lack of Transportation (Non-Medical): No  Physical Activity: Inactive (07/21/2018)   Exercise Vital Sign    Days of Exercise per Week:  0 days    Minutes of Exercise per Session: 0 min  Stress: No Stress Concern Present (07/21/2018)   [employer] of Occupational Health - Occupational Stress Questionnaire    Feeling of Stress : Not at all  Social Connections: Moderately Integrated (07/21/2018)   Social Connection and Isolation Panel [NHANES]    Frequency of Communication with Friends and Family: More than three times a week    Frequency of Social Gatherings with Friends and Family: More than three times a week    Attends Religious Services: More than 4 times per year    Active Member of [employer] or Organizations: Yes    Attends Banker Meetings: More than 4 times per year    Marital Status: Widowed  Intimate Partner Violence: Not At Risk (07/21/2018)   Humiliation, Afraid, Rape, and Kick questionnaire    Fear of Current or Ex-Partner: No    Emotionally Abused: No    Physically Abused: No    Sexually Abused: No    Family History: Family History  Problem Relation Age of Onset   Prostate cancer Father    Colon cancer Neg Hx     Current Medications:  Current Outpatient Medications:    Adalimumab (HUMIRA PEN) 40 MG/0.8ML PNKT, Inject 40 mg into the skin every 14 (fourteen) days. INJECT 40 MG EVERY 14 DAYS AS DIRECTED, Disp: 2 each, Rfl: 11   Calcium Carb-Cholecalciferol (CALCIUM 600 + D PO), Take 1 tablet by mouth daily., Disp: , Rfl:    denosumab (PROLIA) 60 MG/ML SOLN injection, Inject 60 mg into the skin every 6 (six) months. Administer in upper arm, thigh, or abdomen, Disp: , Rfl:    ELIQUIS 2.5 MG TABS tablet, Take 1 tablet (2.5 mg total) by mouth 2 (two) times daily., Disp: 60 tablet, Rfl:    Ergocalciferol 10 MCG (400 UNIT) TABS, Take 1 each by mouth daily., Disp: 90 tablet, Rfl: 3   exemestane (AROMASIN) 25 MG tablet, TAKE 1 TABLET DAILY AFTER BREAKFAST., Disp: 30 tablet, Rfl: 0   FARXIGA 10 MG TABS tablet, Take 10 mg by mouth daily., Disp: , Rfl:    Magnesium 250 MG TABS, Take 250 mg by mouth  daily., Disp: ,  Rfl:    OVER THE COUNTER MEDICATION, Take 2-4 tablets by mouth 3 (three) times a week. Healthy Feet and Nerves otc supplement, Disp: , Rfl:    polyethylene glycol (MIRALAX / GLYCOLAX) 17 g packet, Take 17 g by mouth 2 (two) times daily., Disp: 14 each, Rfl: 0   PRESCRIPTION MEDICATION, Apply 1 application topically daily as needed (irritation). Fluticasone 0.05 % and Ketoconazole 2% 1:1, Disp: , Rfl:  No current facility-administered medications for this visit.  Facility-Administered Medications Ordered in Other Visits:    sodium chloride flush (NS) 0.9 % injection 10 mL, 10 mL, Intravenous, PRN, Doreatha Massed, MD, 10 mL at 07/24/21 1429   Allergies: Allergies  Allergen Reactions   Mercaptopurine Other (See Comments)    Dropped WBC   Sulfa Antibiotics Other (See Comments)    Extreme Weakness    REVIEW OF SYSTEMS:   Review of Systems  Constitutional:  Negative for chills, fatigue and fever.  HENT:   Negative for lump/mass, mouth sores, nosebleeds, sore throat and trouble swallowing.   Eyes:  Negative for eye problems.  Respiratory:  Negative for cough and shortness of breath.   Cardiovascular:  Negative for chest pain, leg swelling and palpitations.  Gastrointestinal:  Negative for abdominal pain, constipation, diarrhea, nausea and vomiting.  Genitourinary:  Negative for bladder incontinence, difficulty urinating, dysuria, frequency, hematuria and nocturia.   Musculoskeletal:  Negative for arthralgias, back pain, flank pain, myalgias and neck pain.  Skin:  Negative for itching and rash.  Neurological:  Negative for dizziness, headaches and numbness.  Hematological:  Does not bruise/bleed easily.  Psychiatric/Behavioral:  Negative for depression, sleep disturbance and suicidal ideas. The patient is not nervous/anxious.   All other systems reviewed and are negative.    VITALS:   There were no vitals taken for this visit.  Wt Readings from Last 3 Encounters:   03/02/23 173 lb 6.4 oz (78.7 kg)  01/19/23 172 lb 9.6 oz (78.3 kg)  10/13/22 169 lb 12.8 oz (77 kg)    There is no height or weight on file to calculate BMI.  Performance status (ECOG): {CHL ONC [id number]  PHYSICAL EXAM:   Physical Exam Vitals and nursing note reviewed. Exam conducted with a chaperone present.  Constitutional:      Appearance: Normal appearance.  Cardiovascular:     Rate and Rhythm: Normal rate and regular rhythm.     Pulses: Normal pulses.     Heart sounds: Normal heart sounds.  Pulmonary:     Effort: Pulmonary effort is normal.     Breath sounds: Normal breath sounds.  Abdominal:     Palpations: Abdomen is soft. There is no hepatomegaly, splenomegaly or mass.     Tenderness: There is no abdominal tenderness.  Musculoskeletal:     Right lower leg: No edema.     Left lower leg: No edema.  Lymphadenopathy:     Cervical: No cervical adenopathy.     Right cervical: No superficial, deep or posterior cervical adenopathy.    Left cervical: No superficial, deep or posterior cervical adenopathy.     Upper Body:     Right upper body: No supraclavicular or axillary adenopathy.     Left upper body: No supraclavicular or axillary adenopathy.  Neurological:     General: No focal deficit present.     Mental Status: She is alert and oriented to person, place, and time.  Psychiatric:        Mood and Affect: Mood normal.  Behavior: Behavior normal.     LABS:      Latest Ref Rng & Units 03/02/2023    1:48 PM 09/01/2022   10:57 AM 02/27/2022    2:08 PM  CBC  WBC 4.0 - 10.5 K/uL 7.1  6.3  7.4   Hemoglobin 12.0 - 15.0 g/dL 16.1  09.6  04.5   Hematocrit 36.0 - 46.0 % 44.8  44.4  42.5   Platelets 150 - 400 K/uL 205  196  186       Latest Ref Rng & Units 03/02/2023    1:48 PM 09/01/2022   10:57 AM 02/27/2022    2:08 PM  CMP  Glucose 70 - 99 mg/dL 409  811  914   BUN 8 - 23 mg/dL 23  22  22    Creatinine 0.44 - 1.00 mg/dL 7.82  9.56  2.13   Sodium 135 -  145 mmol/L 136  136  136   Potassium 3.5 - 5.1 mmol/L 4.0  3.9  3.3   Chloride 98 - 111 mmol/L 103  103  103   CO2 22 - 32 mmol/L 25  24  26    Calcium 8.9 - 10.3 mg/dL 9.6  8.9  9.0   Total Protein 6.5 - 8.1 g/dL 7.4  7.2  7.1   Total Bilirubin 0.3 - 1.2 mg/dL 0.7  0.8  0.5   Alkaline Phos 38 - 126 U/L 32  37  77   AST 15 - 41 U/L 19  22  20    ALT 0 - 44 U/L 20  20  17       No results found for: "CEA1", "CEA" / No results found for: "CEA1", "CEA" No results found for: "PSA1" No results found for: "YQM578" No results found for: "CAN125"  No results found for: "TOTALPROTELP", "ALBUMINELP", "A1GS", "A2GS", "BETS", "BETA2SER", "GAMS", "MSPIKE", "SPEI" No results found for: "TIBC", "FERRITIN", "IRONPCTSAT" Lab Results  Component Value Date   LDH 137 07/24/2021   LDH 150 08/23/2020   LDH 137 06/29/2020     STUDIES:   DG Bone Density  Result Date: 03/02/2023 EXAM: DUAL X-RAY ABSORPTIOMETRY (DXA) FOR BONE MINERAL DENSITY IMPRESSION: Your patient Monique Day completed a BMD test on 03/02/2023 using the [employer] DXA System (software version: 14.10) manufactured by [employer]. The following summarizes the results of our evaluation. Technologist: BRG PATIENT BIOGRAPHICAL: Name: Monique Day, Monique Day Patient ID: [id number] Birth Date: Mar 31, 1936 Height: 62.5 in. Gender: Female Exam Date: 03/02/2023 Weight: 173.4 lbs. Indications: Caucasian, Chronic Steroid Use, Colitis, Follow up Osteopenia, History of Fracture (Adult), Hx Breast Ca, Post Menopausal Fractures: Hip, Wrist Treatments: Vitamin D, Calcium, Prolia, Exemestane DENSITOMETRY RESULTS: Site         Region        Measured Date Measured Age WHO Classification Young Adult T-score BMD         %Change vs. Previous Significant Change (*) AP Spine L1-L4 (L2,L3) 5/02/2025 84.9 Normal 0.9 1.268 g/cm2 8.4% Yes AP Spine L1-L4 (L2,L3) 12/19/2020 82.7 Normal 0.0 1.170 g/cm2 - - Right Femur Neck 5/02/2023 84.9 Normal -0.9 0.908 g/cm2  6.7% Yes Right Femur Neck 12/19/2020 82.7 Osteopenia -1.3 0.851 g/cm2 - - Right Femur Total 5/02/2023 84.9 Normal -0.1 0.992 g/cm2 -0.9% - Right Femur Total 12/19/2020 82.7 Normal 0.0 1.001 g/cm2 - - Left Forearm Radius 33% 5/02/2023 84.9 Normal -0.2 0.688 g/cm2 -5.5% - Left Forearm Radius 33% 12/19/2020 82.7 Normal 0.3 0.728 g/cm2 - - ASSESSMENT: The BMD measured at Femur Neck  is 0.908 g/cm2 with a T-score of -0.9. This patient is considered normal according to World Health Organization [facility]) criteria. The scan quality is good. L2-L3 was excluded due to advanced degenerative changes. Compared with the prior study on 12/19/20 , the BMD of the total mean shows no statistically significant change. Patient is not a candidate for FRAX assessment due to normal classification. World Science writer [facility]) criteria for post-menopausal, Caucasian Women: Normal:       T-score at or above -1 SD Osteopenia:   T-score between -1 and -2.5 SD Osteoporosis: T-score at or below -2.5 SD RECOMMENDATIONS: 1. All patients should optimize calcium and vitamin D intake. 2. Consider FDA-approved medical therapies in postmenopausal women and med aged 15 years and older, based on the following: a. A hip or vertebral (clinical or morphometric) fracture b. T-score< -2.5 at the femoral neck or spine after appropriate evaluation to exclude secondary causes c. Low bone mass (T-score between -1.0 and -2.5 at the femoral neck or spine) and a 10-year probability of a hip fracture > 3% or a 10-year probability of a major osteoporosis-related fracture > 20% based on the [place]-adapted WHO algorithm d. Clinician judgment and/or patient preferences may indicate treatment for people with 10-year fracture probabilities above or below these levels FOLLOW-UP: Patients with diagnosis of osteoporsis or at high risk for fracture should have regular bone mineral density tests. For patients eligible for Medicare, routine testing is allowed once every 2 years.  The testing frequency can be increased to one year for patients who have rapidly progressing disease, those who are receiving or discontinuing medical therapy to restore bone mass, or have additional risk factors. I have reviewed this report, and agree with the above findings. [facility] Electronically Signed   By: Baird Lyons M.D.   On: 03/02/2023 14:52

## 2023-03-09 ENCOUNTER — Inpatient Hospital Stay: Payer: Medicare PPO | Admitting: Hematology

## 2023-03-09 VITALS — BP 136/70 | HR 85 | Temp 98.4°F | Resp 18 | Wt 172.6 lb

## 2023-03-09 DIAGNOSIS — Z170 Estrogen receptor positive status [ER+]: Secondary | ICD-10-CM

## 2023-03-09 DIAGNOSIS — C50212 Malignant neoplasm of upper-inner quadrant of left female breast: Secondary | ICD-10-CM

## 2023-03-09 NOTE — Patient Instructions (Signed)
[facility]  Discharge Instructions  You were seen and examined today by Dr. Katragadda.  Your labs are stable.  Follow-up as scheduled.  Thank you for choosing [facility] [place] [facility] to provide your oncology and hematology care.   To afford each patient quality time with our provider, please arrive at least 15 minutes before your scheduled appointment time. You may need to reschedule your appointment if you arrive late (10 or more minutes). Arriving late affects you and other patients whose appointments are after yours.  Also, if you miss three or more appointments without notifying the office, you may be dismissed from the clinic at the provider's discretion.    Again, thank you for choosing [facility].  Our hope is that these requests will decrease the amount of time that you wait before being seen by our physicians.   If you have a lab appointment with the Cancer Center - please note that after April 8th, all labs will be drawn in the cancer center.  You do not have to check in or register with the main entrance as you have in the past but will complete your check-in at the cancer center.            _____________________________________________________________  Should you have questions after your visit to [facility], please contact our office at [phone] and follow the prompts.  Our office hours are 8:00 a.m. to 4:30 p.m. Monday - Thursday and 8:00 a.m. to 2:30 p.m. Friday.  Please note that voicemails left after 4:00 p.m. may not be returned until the following business day.  We are closed weekends and all major holidays.  You do have access to a nurse 24-7, just call the main number to the clinic [phone] and do not press any options, hold on the line and a nurse will answer the phone.    For prescription refill requests, have your pharmacy contact our office and allow 72 hours.    Masks are no longer  required in the cancer centers. If you would like for your care team to wear a mask while they are taking care of you, please let them know. You may have one support person who is at least 85 years old accompany you for your appointments.  

## 2023-04-02 ENCOUNTER — Encounter: Payer: Self-pay | Admitting: General Surgery

## 2023-04-02 ENCOUNTER — Ambulatory Visit: Payer: Medicare PPO | Admitting: General Surgery

## 2023-04-02 VITALS — BP 154/81 | HR 69 | Temp 98.6°F | Resp 14 | Ht 62.5 in | Wt 173.0 lb

## 2023-04-02 DIAGNOSIS — Z95828 Presence of other vascular implants and grafts: Secondary | ICD-10-CM | POA: Diagnosis not present

## 2023-04-02 DIAGNOSIS — C50212 Malignant neoplasm of upper-inner quadrant of left female breast: Secondary | ICD-10-CM | POA: Diagnosis not present

## 2023-04-02 NOTE — Patient Instructions (Signed)
Stop Eliquis on Monday, 8/19

## 2023-04-03 NOTE — Progress Notes (Signed)
Monique Day; [id number]; 1936-06-10   HPI Patient is an 87 year old white female who was referred to my care by Dr. Ellin Saba of oncology for Port-A-Cath removal.  She has finished with her chemotherapy and does not need her port anymore.  She does take Eliquis for history of DVT. Past Medical History:  Diagnosis Date   Adult idiopathic generalized osteoporosis    Arthritis    "minor" (08/18/2018)   Breast cancer, left breast (HCC) 1998   lumpectomy   Breast cancer, right breast (HCC) 2009   lumpectomy; mastectomy   DVT (deep venous thrombosis) (HCC) "early 2000's"   RLE   DVT (deep venous thrombosis) (HCC)    Hemorrhoids, external    History of blood transfusion    "related to ulcerative colitis"   Lower abdominal pain    Occult blood in stools    Peripheral edema    Seizures (HCC)    one very slight seizure with the stroke; somewhere between 2005-2009   Stroke [facility]) 2005-2009   "very light;" ; denies residual on 08/19/2018   UC (ulcerative colitis) (HCC) dx'd 1957   Warfarin-induced coagulopathy (HCC) 06/09/2012    Past Surgical History:  Procedure Laterality Date   APPENDECTOMY     BIOPSY  04/14/2019   Procedure: BIOPSY;  Surgeon: Malissa Hippo, MD;  [facility]: [facility];  Service: Endoscopy;;   bone density  12/11/10   BREAST BIOPSY Left 1998; 2019 X 2   BREAST BIOPSY Right 2009   BREAST LUMPECTOMY Left 1998   CATARACT EXTRACTION W/ INTRAOCULAR LENS  IMPLANT, BILATERAL Bilateral    COLONOSCOPY  02/21/2011   COLONOSCOPY  10/06/2008   COLONOSCOPY  12/27/01   COLONOSCOPY  05/09/05   COLONOSCOPY N/A 07/02/2016   Procedure: COLONOSCOPY;  Surgeon: Malissa Hippo, MD;  [facility]: [facility];  Service: Endoscopy;  Laterality: N/A;  1055   COLONOSCOPY N/A 07/04/2021   Procedure: COLONOSCOPY;  Surgeon: Malissa Hippo, MD;  [facility];  Service: Endoscopy;  Laterality: N/A;  7:30   EXPLORATORY LAPAROTOMY  1958   "I was having rectal bleeding"    FLEXIBLE SIGMOIDOSCOPY N/A 04/14/2019   Procedure: FLEXIBLE SIGMOIDOSCOPY;  Surgeon: Malissa Hippo, MD;  [facility];  Service: Endoscopy;  Laterality: N/A;  1:00   FRACTURE SURGERY     INTRAMEDULLARY (IM) NAIL INTERTROCHANTERIC Left 12/19/2021   Procedure: INTRAMEDULLARY (IM) NAIL INTERTROCHANTRIC;  Surgeon: Vickki Hearing, MD;  Location: [facility];  Service: Orthopedics;  Laterality: Left;   MASTECTOMY Right 2009   MASTECTOMY COMPLETE / SIMPLE Left 08/19/2018   POLYPECTOMY  07/04/2021   Procedure: POLYPECTOMY;  Surgeon: Malissa Hippo, MD;  [facility];  Service: Endoscopy;;   PORTACATH PLACEMENT Right 10/15/2018   Procedure: INSERTION PORT-A-CATH RIGHT SUBCLAVIN;  Surgeon: Franky Macho, MD;  Location: [facility];  Service: General;  Laterality: Right;  pt knows to arrive at 7:30   SIMPLE MASTECTOMY WITH AXILLARY SENTINEL NODE BIOPSY Left 08/19/2018   Procedure: LEFT SIMPLE MASTECTOMY;  Surgeon: Harriette Bouillon, MD;  Location: [facility];  Service: General;  Laterality: Left;   TONSILLECTOMY     VENA CAVA FILTER PLACEMENT Right    WRIST FRACTURE SURGERY Right     Family History  Problem Relation Age of Onset   Prostate cancer Father    Colon cancer Neg Hx     Current Outpatient Medications on File Prior to Visit  Medication Sig Dispense Refill   Adalimumab (HUMIRA PEN) 40 MG/0.8ML  PNKT Inject 40 mg into the skin every 14 (fourteen) days. INJECT 40 MG EVERY 14 DAYS AS DIRECTED 2 each 11   Calcium Carb-Cholecalciferol (CALCIUM 600 + D PO) Take 1 tablet by mouth daily.     denosumab (PROLIA) 60 MG/ML SOLN injection Inject 60 mg into the skin every 6 (six) months. Administer in upper arm, thigh, or abdomen     ELIQUIS 2.5 MG TABS tablet Take 1 tablet (2.5 mg total) by mouth 2 (two) times daily. 60 tablet    Ergocalciferol 10 MCG (400 UNIT) TABS Take 1 each by mouth daily. (Patient taking differently: Take 2,000 Units by mouth daily.) 90 tablet 3   exemestane (AROMASIN)  25 MG tablet TAKE 1 TABLET DAILY AFTER BREAKFAST. 30 tablet 0   FARXIGA 10 MG TABS tablet Take 10 mg by mouth daily.     MAGNESIUM PO Take 500 mg by mouth daily.     OVER THE COUNTER MEDICATION Take 2 tablets by mouth 2 (two) times daily. Healthy Feet and Nerves otc supplement     polyethylene glycol (MIRALAX / GLYCOLAX) 17 g packet Take 17 g by mouth 2 (two) times daily. (Patient taking differently: Take 17 g by mouth daily.) 14 each 0   PRESCRIPTION MEDICATION Apply 1 application  topically daily as needed (irritation). [facility] Fluticasone 0.05 % and Ketoconazole 2% 1:1     Current Facility-Administered Medications on File Prior to Visit  Medication Dose Route Frequency Provider Last Rate Last Admin   sodium chloride flush (NS) 0.9 % injection 10 mL  10 mL Intravenous PRN Doreatha Massed, MD   10 mL at 07/24/21 1429    Allergies  Allergen Reactions   Mercaptopurine Other (See Comments)    Dropped WBC   Sulfa Antibiotics Other (See Comments)    Extreme Weakness    Social History   Substance and Sexual Activity  Alcohol Use Yes   Comment: 08/18/2018 "couple drinks/year"    Social History   Tobacco Use  Smoking Status Former   Current packs/day: 0.00   Average packs/day: 2.0 packs/day for 40.0 years (80.0 ttl pk-yrs)   Types: Cigarettes   Start date: 05/05/1961   Quit date: 05/05/2001   Years since quitting: 21.9   Passive exposure: Never  Smokeless Tobacco Never    Review of Systems  Constitutional: Negative.   HENT: Negative.    Eyes: Negative.   Respiratory: Negative.    Cardiovascular: Negative.   Gastrointestinal: Negative.   Genitourinary: Negative.   Musculoskeletal:  Positive for back pain and joint pain.  Skin: Negative.   Neurological:  Positive for sensory change.  Endo/Heme/Allergies:  Bruises/bleeds easily.  Psychiatric/Behavioral: Negative.      Objective   Vitals:   04/02/23 1140  BP: (!) 154/81  Pulse: 69  Resp: 14   Temp: 98.6 F (37 C)  SpO2: 96%    Physical Exam Vitals reviewed.  Constitutional:      Appearance: Normal appearance. She is not ill-appearing.  HENT:     Head: Normocephalic and atraumatic.  Cardiovascular:     Rate and Rhythm: Normal rate and regular rhythm.     Heart sounds: Normal heart sounds. No murmur heard.    No friction rub. No gallop.  Pulmonary:     Effort: Pulmonary effort is normal. No respiratory distress.     Breath sounds: Normal breath sounds. No stridor. No wheezing or rhonchi.     Comments: Port-A-Cath in place right upper chest. Skin:  General: Skin is warm and dry.  Neurological:     Mental Status: She is alert and oriented to person, place, and time.   Previous office notes reviewed  Assessment  History of breast cancer, finished with chemotherapy Plan  Patient is scheduled for Port-A-Cath removal in the minor procedure room on 04/08/2023.  The risks and benefits of the procedure including bleeding and infection were fully explained to the patient, who gave informed consent.  She will stop her Eliquis 2 days before the procedure.

## 2023-04-03 NOTE — H&P (Signed)
Monique Day; [id number]; 1936-06-10   HPI Patient is an 87 year old white female who was referred to my care by Dr. Ellin Saba of oncology for Port-A-Cath removal.  She has finished with her chemotherapy and does not need her port anymore.  She does take Eliquis for history of DVT. Past Medical History:  Diagnosis Date   Adult idiopathic generalized osteoporosis    Arthritis    "minor" (08/18/2018)   Breast cancer, left breast (HCC) 1998   lumpectomy   Breast cancer, right breast (HCC) 2009   lumpectomy; mastectomy   DVT (deep venous thrombosis) (HCC) "early 2000's"   RLE   DVT (deep venous thrombosis) (HCC)    Hemorrhoids, external    History of blood transfusion    "related to ulcerative colitis"   Lower abdominal pain    Occult blood in stools    Peripheral edema    Seizures (HCC)    one very slight seizure with the stroke; somewhere between 2005-2009   Stroke [facility]) 2005-2009   "very light;" ; denies residual on 08/19/2018   UC (ulcerative colitis) (HCC) dx'd 1957   Warfarin-induced coagulopathy (HCC) 06/09/2012    Past Surgical History:  Procedure Laterality Date   APPENDECTOMY     BIOPSY  04/14/2019   Procedure: BIOPSY;  Surgeon: Malissa Hippo, MD;  [facility];  Service: Endoscopy;;   bone density  12/11/10   BREAST BIOPSY Left 1998; 2019 X 2   BREAST BIOPSY Right 2009   BREAST LUMPECTOMY Left 1998   CATARACT EXTRACTION W/ INTRAOCULAR LENS  IMPLANT, BILATERAL Bilateral    COLONOSCOPY  02/21/2011   COLONOSCOPY  10/06/2008   COLONOSCOPY  12/27/01   COLONOSCOPY  05/09/05   COLONOSCOPY N/A 07/02/2016   Procedure: COLONOSCOPY;  Surgeon: Malissa Hippo, MD;  [facility];  Service: Endoscopy;  Laterality: N/A;  1055   COLONOSCOPY N/A 07/04/2021   Procedure: COLONOSCOPY;  Surgeon: Malissa Hippo, MD;  [facility];  Service: Endoscopy;  Laterality: N/A;  7:30   EXPLORATORY LAPAROTOMY  1958   "I was having rectal bleeding"    FLEXIBLE SIGMOIDOSCOPY N/A 04/14/2019   Procedure: FLEXIBLE SIGMOIDOSCOPY;  Surgeon: Malissa Hippo, MD;  [facility];  Service: Endoscopy;  Laterality: N/A;  1:00   FRACTURE SURGERY     INTRAMEDULLARY (IM) NAIL INTERTROCHANTERIC Left 12/19/2021   Procedure: INTRAMEDULLARY (IM) NAIL INTERTROCHANTRIC;  Surgeon: Vickki Hearing, MD;  Location: [facility];  Service: Orthopedics;  Laterality: Left;   MASTECTOMY Right 2009   MASTECTOMY COMPLETE / SIMPLE Left 08/19/2018   POLYPECTOMY  07/04/2021   Procedure: POLYPECTOMY;  Surgeon: Malissa Hippo, MD;  [facility];  Service: Endoscopy;;   PORTACATH PLACEMENT Right 10/15/2018   Procedure: INSERTION PORT-A-CATH RIGHT SUBCLAVIN;  Surgeon: Franky Macho, MD;  Location: [facility];  Service: General;  Laterality: Right;  pt knows to arrive at 7:30   SIMPLE MASTECTOMY WITH AXILLARY SENTINEL NODE BIOPSY Left 08/19/2018   Procedure: LEFT SIMPLE MASTECTOMY;  Surgeon: Harriette Bouillon, MD;  Location: [facility];  Service: General;  Laterality: Left;   TONSILLECTOMY     VENA CAVA FILTER PLACEMENT Right    WRIST FRACTURE SURGERY Right     Family History  Problem Relation Age of Onset   Prostate cancer Father    Colon cancer Neg Hx     Current Outpatient Medications on File Prior to Visit  Medication Sig Dispense Refill   Adalimumab (HUMIRA PEN) 40 MG/0.8ML  PNKT Inject 40 mg into the skin every 14 (fourteen) days. INJECT 40 MG EVERY 14 DAYS AS DIRECTED 2 each 11   Calcium Carb-Cholecalciferol (CALCIUM 600 + D PO) Take 1 tablet by mouth daily.     denosumab (PROLIA) 60 MG/ML SOLN injection Inject 60 mg into the skin every 6 (six) months. Administer in upper arm, thigh, or abdomen     ELIQUIS 2.5 MG TABS tablet Take 1 tablet (2.5 mg total) by mouth 2 (two) times daily. 60 tablet    Ergocalciferol 10 MCG (400 UNIT) TABS Take 1 each by mouth daily. (Patient taking differently: Take 2,000 Units by mouth daily.) 90 tablet 3   exemestane (AROMASIN)  25 MG tablet TAKE 1 TABLET DAILY AFTER BREAKFAST. 30 tablet 0   FARXIGA 10 MG TABS tablet Take 10 mg by mouth daily.     MAGNESIUM PO Take 500 mg by mouth daily.     OVER THE COUNTER MEDICATION Take 2 tablets by mouth 2 (two) times daily. Healthy Feet and Nerves otc supplement     polyethylene glycol (MIRALAX / GLYCOLAX) 17 g packet Take 17 g by mouth 2 (two) times daily. (Patient taking differently: Take 17 g by mouth daily.) 14 each 0   PRESCRIPTION MEDICATION Apply 1 application  topically daily as needed (irritation). [facility] Fluticasone 0.05 % and Ketoconazole 2% 1:1     Current Facility-Administered Medications on File Prior to Visit  Medication Dose Route Frequency Provider Last Rate Last Admin   sodium chloride flush (NS) 0.9 % injection 10 mL  10 mL Intravenous PRN Doreatha Massed, MD   10 mL at 07/24/21 1429    Allergies  Allergen Reactions   Mercaptopurine Other (See Comments)    Dropped WBC   Sulfa Antibiotics Other (See Comments)    Extreme Weakness    Social History   Substance and Sexual Activity  Alcohol Use Yes   Comment: 08/18/2018 "couple drinks/year"    Social History   Tobacco Use  Smoking Status Former   Current packs/day: 0.00   Average packs/day: 2.0 packs/day for 40.0 years (80.0 ttl pk-yrs)   Types: Cigarettes   Start date: 05/05/1961   Quit date: 05/05/2001   Years since quitting: 21.9   Passive exposure: Never  Smokeless Tobacco Never    Review of Systems  Constitutional: Negative.   HENT: Negative.    Eyes: Negative.   Respiratory: Negative.    Cardiovascular: Negative.   Gastrointestinal: Negative.   Genitourinary: Negative.   Musculoskeletal:  Positive for back pain and joint pain.  Skin: Negative.   Neurological:  Positive for sensory change.  Endo/Heme/Allergies:  Bruises/bleeds easily.  Psychiatric/Behavioral: Negative.      Objective   Vitals:   04/02/23 1140  BP: (!) 154/81  Pulse: 69  Resp: 14   Temp: 98.6 F (37 C)  SpO2: 96%    Physical Exam Vitals reviewed.  Constitutional:      Appearance: Normal appearance. She is not ill-appearing.  HENT:     Head: Normocephalic and atraumatic.  Cardiovascular:     Rate and Rhythm: Normal rate and regular rhythm.     Heart sounds: Normal heart sounds. No murmur heard.    No friction rub. No gallop.  Pulmonary:     Effort: Pulmonary effort is normal. No respiratory distress.     Breath sounds: Normal breath sounds. No stridor. No wheezing or rhonchi.     Comments: Port-A-Cath in place right upper chest. Skin:  General: Skin is warm and dry.  Neurological:     Mental Status: She is alert and oriented to person, place, and time.   Previous office notes reviewed  Assessment  History of breast cancer, finished with chemotherapy Plan  Patient is scheduled for Port-A-Cath removal in the minor procedure room on 04/08/2023.  The risks and benefits of the procedure including bleeding and infection were fully explained to the patient, who gave informed consent.  She will stop her Eliquis 2 days before the procedure.

## 2023-04-07 ENCOUNTER — Ambulatory Visit: Payer: Medicare Other | Admitting: General Surgery

## 2023-04-08 ENCOUNTER — Encounter (HOSPITAL_COMMUNITY)
Admission: RE | Disposition: A | Discharge status: Home / Self Care | Payer: Self-pay | Source: Home / Self Care | Attending: General Surgery

## 2023-04-08 ENCOUNTER — Ambulatory Visit (HOSPITAL_COMMUNITY)
Admission: RE | Admit: 2023-04-08 | Discharge: 2023-04-08 | Disposition: A | Discharge status: Home / Self Care | Payer: Medicare PPO | Attending: General Surgery | Admitting: General Surgery

## 2023-04-08 DIAGNOSIS — Z452 Encounter for adjustment and management of vascular access device: Secondary | ICD-10-CM | POA: Diagnosis not present

## 2023-04-08 DIAGNOSIS — Z853 Personal history of malignant neoplasm of breast: Secondary | ICD-10-CM

## 2023-04-08 DIAGNOSIS — Z9221 Personal history of antineoplastic chemotherapy: Secondary | ICD-10-CM | POA: Insufficient documentation

## 2023-04-08 DIAGNOSIS — Z86718 Personal history of other venous thrombosis and embolism: Secondary | ICD-10-CM | POA: Diagnosis not present

## 2023-04-08 DIAGNOSIS — Z7901 Long term (current) use of anticoagulants: Secondary | ICD-10-CM | POA: Diagnosis not present

## 2023-04-08 HISTORY — PX: PORT-A-CATH REMOVAL: SHX5289

## 2023-04-08 SURGERY — MINOR REMOVAL PORT-A-CATH
Anesthesia: LOCAL

## 2023-04-08 MED ORDER — LIDOCAINE HCL (PF) 1 % IJ SOLN
INTRAMUSCULAR | Status: AC
  Filled 2023-04-08: qty 30

## 2023-04-08 SURGICAL SUPPLY — 22 items
ADH SKN CLS APL DERMABOND .7 (GAUZE/BANDAGES/DRESSINGS) ×1
APL PRP STRL LF ISPRP CHG 10.5 (MISCELLANEOUS) ×1
APPLICATOR CHLORAPREP 10.5 ORG (MISCELLANEOUS) ×1 IMPLANT
CLOTH BEACON ORANGE TIMEOUT ST (SAFETY) ×1 IMPLANT
DECANTER SPIKE VIAL GLASS SM (MISCELLANEOUS) ×1 IMPLANT
DERMABOND ADVANCED .7 DNX12 (GAUZE/BANDAGES/DRESSINGS) ×1 IMPLANT
DRAPE HALF SHEET 40X57 (DRAPES) IMPLANT
ELECT REM PT RETURN 9FT ADLT (ELECTROSURGICAL) ×1
ELECTRODE REM PT RTRN 9FT ADLT (ELECTROSURGICAL) ×1 IMPLANT
GLOVE BIOGEL PI IND STRL 7.0 (GLOVE) ×2 IMPLANT
GLOVE SURG SS PI 7.5 STRL IVOR (GLOVE) ×2 IMPLANT
GOWN STRL REUS W/TWL LRG LVL3 (GOWN DISPOSABLE) IMPLANT
NDL HYPO 25X1 1.5 SAFETY (NEEDLE) ×1 IMPLANT
NEEDLE HYPO 25X1 1.5 SAFETY (NEEDLE) ×1
PENCIL SMOKE EVACUATOR COATED (MISCELLANEOUS) IMPLANT
POSITIONER HEAD 8X9X4 ADT (SOFTGOODS) ×1 IMPLANT
SPONGE GAUZE 2X2 8PLY STRL LF (GAUZE/BANDAGES/DRESSINGS) ×1 IMPLANT
SUT MNCRL AB 4-0 PS2 18 (SUTURE) ×1 IMPLANT
SUT VIC AB 3-0 SH 27 (SUTURE) ×1
SUT VIC AB 3-0 SH 27X BRD (SUTURE) ×1 IMPLANT
SYR CONTROL 10ML LL (SYRINGE) ×1 IMPLANT
TOWEL OR 17X26 4PK STRL BLUE (TOWEL DISPOSABLE) ×1 IMPLANT

## 2023-04-08 NOTE — Op Note (Signed)
Patient:  Ryka Butkowski  DOB:  [birth year]  MRN:  [id number]   Preop Diagnosis: History of left breast cancer, Port-A-Cath in place  Postop Diagnosis: Same  Procedure: Port-A-Cath removal  Surgeon: Franky Macho, MD  Anes: Local  Indications: Patient is an 87 year old white female who had a Port-A-Cath placed for chemotherapy due to left breast cancer.  She is finished with her chemotherapy.  The risks and benefits of the procedure were fully explained to the patient, who gave informed consent.  Procedure note: The patient was placed in the supine position in the minor procedure room.  The right upper chest was prepped and draped using usual sterile technique with ChloraPrep.  Surgical site confirmation was performed.  1% Xylocaine was used for local anesthesia.  An incision was made through the previous Port-A-Cath incision site.  This was taken down to the Port-A-Cath.  The Port-A-Cath was removed in total without difficulty.  The subcutaneous layer was reapproximated using a 3-0 Vicryl interrupted suture.  The skin was closed using a 4-0 Monocryl subcuticular suture.  Dermabond was applied.  All tape and needle counts were correct at the end the procedure.  The patient tolerated the procedure well and was discharged from the minor procedure room in good and stable condition.  Complications: None  EBL: Minimal  Specimen: None

## 2023-04-08 NOTE — Interval H&P Note (Signed)
History and Physical Interval Note:  04/08/2023 8:45 AM  Monique Day  has presented today for surgery, with the diagnosis of PORT- A- CATH IN PLACE BREAST CANCER, LEFT ER POSITIVE.  The various methods of treatment have been discussed with the patient and family. After consideration of risks, benefits and other options for treatment, the patient has consented to  Procedure(s): MINOR REMOVAL PORT-A-CATH (N/A) as a surgical intervention.  The patient's history has been reviewed, patient examined, no change in status, stable for surgery.  I have reviewed the patient's chart and labs.  Questions were answered to the patient's satisfaction.     Franky Macho

## 2023-04-09 ENCOUNTER — Encounter (HOSPITAL_COMMUNITY): Payer: Self-pay | Admitting: General Surgery

## 2023-04-13 ENCOUNTER — Ambulatory Visit (INDEPENDENT_AMBULATORY_CARE_PROVIDER_SITE_OTHER): Payer: Medicare PPO | Admitting: Gastroenterology

## 2023-04-13 ENCOUNTER — Encounter (INDEPENDENT_AMBULATORY_CARE_PROVIDER_SITE_OTHER): Payer: Self-pay | Admitting: Gastroenterology

## 2023-04-13 VITALS — BP 162/72 | HR 80 | Temp 97.1°F | Ht 62.5 in | Wt 172.0 lb

## 2023-04-13 DIAGNOSIS — Z111 Encounter for screening for respiratory tuberculosis: Secondary | ICD-10-CM | POA: Diagnosis not present

## 2023-04-13 DIAGNOSIS — K5904 Chronic idiopathic constipation: Secondary | ICD-10-CM | POA: Diagnosis not present

## 2023-04-13 DIAGNOSIS — K5100 Ulcerative (chronic) pancolitis without complications: Secondary | ICD-10-CM

## 2023-04-13 DIAGNOSIS — Z1159 Encounter for screening for other viral diseases: Secondary | ICD-10-CM | POA: Diagnosis not present

## 2023-04-13 DIAGNOSIS — R7989 Other specified abnormal findings of blood chemistry: Secondary | ICD-10-CM | POA: Insufficient documentation

## 2023-04-13 NOTE — Patient Instructions (Addendum)
Continue Humira every 2 weeks Perform blood workup Make an appointment with dermatology - yearly cancer screening Continue daily Miralax Continue oral vitamin D

## 2023-04-13 NOTE — Progress Notes (Signed)
Katrinka Blazing, M.D. Gastroenterology & Hepatology [facility] [address] [place], [place] [address]  Primary Care Physician: Rebekah Chesterfield, NP [address] [place] [address] [place] [address]  I will communicate my assessment and recommendations to the referring MD via EMR.  Problems: Ulcerative colitis Low vitamin D CIC  History of Present Illness: Monique Day is a 87 y.o. female with past medical history of  DVT, ulcerative pancolitis, seizures, breast cancer status post mastectomy, arthritis, who comes for follow up ulcerative colitis.  The patient was last seen on 10/13/22. At that time, the patient was continued on Humira every 2 weeks and on vitamin D supplementation.  She is having one BM per day. Takes Miralax once a day to avoid constipation. The patient denies having any nausea, vomiting, fever, chills, hematochezia, melena, hematemesis, abdominal distention, abdominal pain, diarrhea, jaundice, pruritus or weight loss.  Patient reports that her pharmacy has had issues with medications, as they had been delayed two times getting Humira available on time for her.  She has not received her next dose yet and was supposed to receive it on Friday.  Most recent labs from 03/02/2023 showed CMP with creatinine 1.06, BUN 23, normal electrolytes, normal hepatic panel.  CBC was normal as well.  Previous medications: Remicade  Last flu shot:October 2023 Last pneumonia shot: yes, had the least potent one but not interested to have other shots Last zoster vaccine: 2021, Shingrix Last DEXA scan: 03/02/2023 -  T score -0.9 normal  Most recent TB testing and hepatitis B surface antigen were performed on 04/07/2022. Dermatology evaluation: many years ago   Last Colonoscopy: 07/04/2021 - A single non-bleeding colonic angiodysplastic lesion. - Scar in the entire examined colon. - One 6 mm polyp in the distal rectum, removed with a cold snare.  Resected and retrieved  Past Medical History: Past Medical History:  Diagnosis Date   Adult idiopathic generalized osteoporosis    Arthritis    "minor" (08/18/2018)   Breast cancer, left breast (HCC) 1998   lumpectomy   Breast cancer, right breast (HCC) 2009   lumpectomy; mastectomy   DVT (deep venous thrombosis) (HCC) "early 2000's"   RLE   DVT (deep venous thrombosis) (HCC)    Hemorrhoids, external    History of blood transfusion    "related to ulcerative colitis"   Lower abdominal pain    Occult blood in stools    Peripheral edema    Seizures (HCC)    one very slight seizure with the stroke; somewhere between 2005-2009   Stroke [facility]) 2005-2009   "very light;" ; denies residual on 08/19/2018   UC (ulcerative colitis) (HCC) dx'd 1957   Warfarin-induced coagulopathy (HCC) 06/09/2012    Past Surgical History: Past Surgical History:  Procedure Laterality Date   APPENDECTOMY     BIOPSY  04/14/2019   Procedure: BIOPSY;  Surgeon: Malissa Hippo, MD;  [facility];  Service: Endoscopy;;   bone density  12/11/10   BREAST BIOPSY Left 1998; 2019 X 2   BREAST BIOPSY Right 2009   BREAST LUMPECTOMY Left 1998   CATARACT EXTRACTION W/ INTRAOCULAR LENS  IMPLANT, BILATERAL Bilateral    COLONOSCOPY  02/21/2011   COLONOSCOPY  10/06/2008   COLONOSCOPY  12/27/01   COLONOSCOPY  05/09/05   COLONOSCOPY N/A 07/02/2016   Procedure: COLONOSCOPY;  Surgeon: Malissa Hippo, MD;  [facility];  Service: Endoscopy;  Laterality: N/A;  1055   COLONOSCOPY N/A  07/04/2021   Procedure: COLONOSCOPY;  Surgeon: Malissa Hippo, MD;  [facility];  Service: Endoscopy;  Laterality: N/A;  7:30   EXPLORATORY LAPAROTOMY  1958   "I was having rectal bleeding"   FLEXIBLE SIGMOIDOSCOPY N/A 04/14/2019   Procedure: FLEXIBLE SIGMOIDOSCOPY;  Surgeon: Malissa Hippo, MD;  [facility];  Service: Endoscopy;  Laterality: N/A;  1:00   FRACTURE SURGERY     INTRAMEDULLARY (IM)  NAIL INTERTROCHANTERIC Left 12/19/2021   Procedure: INTRAMEDULLARY (IM) NAIL INTERTROCHANTRIC;  Surgeon: Vickki Hearing, MD;  Location: [facility];  Service: Orthopedics;  Laterality: Left;   MASTECTOMY Right 2009   MASTECTOMY COMPLETE / SIMPLE Left 08/19/2018   POLYPECTOMY  07/04/2021   Procedure: POLYPECTOMY;  Surgeon: Malissa Hippo, MD;  [facility];  Service: Endoscopy;;   PORT-A-CATH REMOVAL N/A 04/08/2023   Procedure: MINOR REMOVAL PORT-A-CATH;  Surgeon: Franky Macho, MD;  Location: [facility];  Service: General;  Laterality: N/A;   PORTACATH PLACEMENT Right 10/15/2018   Procedure: INSERTION PORT-A-CATH RIGHT SUBCLAVIN;  Surgeon: Franky Macho, MD;  Location: [facility];  Service: General;  Laterality: Right;  pt knows to arrive at 7:30   SIMPLE MASTECTOMY WITH AXILLARY SENTINEL NODE BIOPSY Left 08/19/2018   Procedure: LEFT SIMPLE MASTECTOMY;  Surgeon: Harriette Bouillon, MD;  [facility]: [facility];  Service: General;  Laterality: Left;   TONSILLECTOMY     VENA CAVA FILTER PLACEMENT Right    WRIST FRACTURE SURGERY Right     Family History: Family History  Problem Relation Age of Onset   Prostate cancer Father    Colon cancer Neg Hx     Social History: Social History   Tobacco Use  Smoking Status Former   Current packs/day: 0.00   Average packs/day: 2.0 packs/day for 40.0 years (80.0 ttl pk-yrs)   Types: Cigarettes   Start date: 05/05/1961   Quit date: 05/05/2001   Years since quitting: 21.9   Passive exposure: Never  Smokeless Tobacco Never   Social History   Substance and Sexual Activity  Alcohol Use Yes   Comment: 08/18/2018 "couple drinks/year"   Social History   Substance and Sexual Activity  Drug Use Never    Allergies: Allergies  Allergen Reactions   Mercaptopurine Other (See Comments)    Dropped WBC   Sulfa Antibiotics Other (See Comments)    Extreme Weakness    Medications: Current Outpatient Medications  Medication Sig Dispense Refill   Adalimumab  (HUMIRA PEN) 40 MG/0.8ML PNKT Inject 40 mg into the skin every 14 (fourteen) days. INJECT 40 MG EVERY 14 DAYS AS DIRECTED 2 each 11   Calcium Carb-Cholecalciferol (CALCIUM 600 + D PO) Take 1 tablet by mouth daily.     denosumab (PROLIA) 60 MG/ML SOLN injection Inject 60 mg into the skin every 6 (six) months. Administer in upper arm, thigh, or abdomen     ELIQUIS 2.5 MG TABS tablet Take 1 tablet (2.5 mg total) by mouth 2 (two) times daily. 60 tablet    Ergocalciferol 10 MCG (400 UNIT) TABS Take 1 each by mouth daily. (Patient taking differently: Take 2,000 Units by mouth daily.) 90 tablet 3   exemestane (AROMASIN) 25 MG tablet TAKE 1 TABLET DAILY AFTER BREAKFAST. 30 tablet 0   FARXIGA 10 MG TABS tablet Take 10 mg by mouth daily.     MAGNESIUM PO Take 500 mg by mouth daily.     Misc Natural Products (JOINT HEALTH) CAPS Take 1 capsule by mouth daily. With Turmeric  OVER THE COUNTER MEDICATION Take 2 tablets by mouth 2 (two) times daily. Healthy Feet and Nerves otc supplement     OVER THE COUNTER MEDICATION Take 1 capsule by mouth daily. Macro Health     polyethylene glycol (MIRALAX / GLYCOLAX) 17 g packet Take 17 g by mouth 2 (two) times daily. (Patient taking differently: Take 17 g by mouth daily.) 14 each 0   PRESCRIPTION MEDICATION Apply 1 application  topically daily as needed (irritation). [facility] Fluticasone 0.05 % and Ketoconazole 2% 1:1     No current facility-administered medications for this visit.   Facility-Administered Medications Ordered in Other Visits  Medication Dose Route Frequency Provider Last Rate Last Admin   sodium chloride flush (NS) 0.9 % injection 10 mL  10 mL Intravenous PRN Doreatha Massed, MD   10 mL at 07/24/21 1429    Review of Systems: GENERAL: negative for malaise, night sweats HEENT: No changes in hearing or vision, no nose bleeds or other nasal problems. NECK: Negative for lumps, goiter, pain and significant neck  swelling RESPIRATORY: Negative for cough, wheezing CARDIOVASCULAR: Negative for chest pain, leg swelling, palpitations, orthopnea GI: SEE HPI MUSCULOSKELETAL: Negative for joint pain or swelling, back pain, and muscle pain. SKIN: Negative for lesions, rash PSYCH: Negative for sleep disturbance, mood disorder and recent psychosocial stressors. HEMATOLOGY Negative for prolonged bleeding, bruising easily, and swollen nodes. ENDOCRINE: Negative for cold or heat intolerance, polyuria, polydipsia and goiter. NEURO: negative for tremor, gait imbalance, syncope and seizures. The remainder of the review of systems is noncontributory.   Physical Exam: BP (!) 162/72 (BP Location: Left Arm, Patient Position: Sitting, Cuff Size: Large)   Pulse 80   Temp (!) 97.1 F (36.2 C) (Temporal)   Ht 5' 2.5" (1.588 m)   Wt 172 lb (78 kg)   BMI 30.96 kg/m  GENERAL: The patient is AO x3, in no acute distress. HEENT: Head is normocephalic and atraumatic. EOMI are intact. Mouth is well hydrated and without lesions. NECK: Supple. No masses LUNGS: Clear to auscultation. No presence of rhonchi/wheezing/rales. Adequate chest expansion HEART: RRR, normal s1 and s2. ABDOMEN: Soft, nontender, no guarding, no peritoneal signs, and nondistended. BS +. No masses. EXTREMITIES: Without any cyanosis, clubbing, rash, lesions or edema. NEUROLOGIC: AOx3, no focal motor deficit. SKIN: no jaundice, no rashes  Imaging/Labs: as above  I personally reviewed and interpreted the available labs, imaging and endoscopic files.  Impression and Plan: Monique Day is a 87 y.o. female with past medical history of  DVT, ulcerative pancolitis, seizures, breast cancer status post mastectomy, arthritis, who comes for follow up ulcerative colitis.  The patient has presented significant improvement with the use of Humira has achieved clinical remission, as well as endoscopic remission in 2022.  Has tolerated the Humira adequately  without any significant side effects.  We will check repeat CRP, as well as hepatitis B and TB testing today.  Patient is up-to-date with most of her preventative measures, I advised her to follow-up with dermatology for annual skin cancer screening.  She has presented some intermittent episodes of constipation, which have improved with the use of MiraLAX.  She will continue taking these on a regular basis.  - Continue Humira every 2 weeks -Check CRP, TB testing and hepatitis B testing - Make an appointment with dermatology - yearly cancer screening - Continue daily Miralax - Continue oral vitamin D  All questions were answered.      Katrinka Blazing, MD Gastroenterology and Hepatology  [facility] Gastroenterology

## 2023-04-15 LAB — QUANTIFERON-TB GOLD PLUS
Mitogen-NIL: >10 [IU]/mL
NIL: 0.07 [IU]/mL
QuantiFERON-TB Gold Plus: NEGATIVE
TB1-NIL: 0.01 [IU]/mL
TB2-NIL: 0.02 [IU]/mL

## 2023-04-15 LAB — HEPATITIS B SURFACE ANTIGEN: Hepatitis B Surface Ag: NONREACTIVE

## 2023-04-15 LAB — C-REACTIVE PROTEIN: CRP: <3 mg/L (ref <8.0)

## 2023-06-02 ENCOUNTER — Inpatient Hospital Stay: Payer: Medicare PPO

## 2023-06-16 ENCOUNTER — Other Ambulatory Visit: Payer: Self-pay | Admitting: Hematology

## 2023-06-16 DIAGNOSIS — C50212 Malignant neoplasm of upper-inner quadrant of left female breast: Secondary | ICD-10-CM

## 2023-06-23 DIAGNOSIS — M79676 Pain in unspecified toe(s): Secondary | ICD-10-CM | POA: Diagnosis not present

## 2023-06-23 DIAGNOSIS — E1142 Type 2 diabetes mellitus with diabetic polyneuropathy: Secondary | ICD-10-CM | POA: Diagnosis not present

## 2023-06-23 DIAGNOSIS — L84 Corns and callosities: Secondary | ICD-10-CM | POA: Diagnosis not present

## 2023-06-23 DIAGNOSIS — B351 Tinea unguium: Secondary | ICD-10-CM | POA: Diagnosis not present

## 2023-07-24 ENCOUNTER — Ambulatory Visit: Payer: Medicare PPO | Admitting: Orthopedic Surgery

## 2023-07-24 DIAGNOSIS — M654 Radial styloid tenosynovitis [de Quervain]: Secondary | ICD-10-CM | POA: Diagnosis not present

## 2023-07-24 MED ORDER — METHYLPREDNISOLONE ACETATE 40 MG/ML IJ SUSP
40.0000 mg | Freq: Once | INTRAMUSCULAR | Status: AC
Start: 2023-07-24 — End: 2023-07-24
  Administered 2023-07-24: 40 mg via INTRA_ARTICULAR

## 2023-07-24 NOTE — Progress Notes (Signed)
Office Visit Note   Patient: Monique Day           Date of Birth: 1935/12/15           MRN: [id number] Visit Date: 07/24/2023 Requested by: Rebekah Chesterfield, NP [address] [place] [address] [place],  [place] [address] PCP: Rebekah Chesterfield, NP   Assessment & Plan:   Encounter Diagnosis  Name Primary?   De Quervain's tenosynovitis, left Yes    No orders of the defined types were placed in this encounter.   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 Patient gave verbal consent timeout to confirm site of injection  Sterile technique ethyl chloride used for skin prep  No complications    Subjective: Chief Complaint  Patient presents with   Hand Injury    Pain in the left thumb 2 - 3 months now   with pain shooting up the arm     HPI: 87 year old female intermittent pain in her left thumb which has worsened.  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 De Quervain's tenosynovitis, left      Follow-Up Instructions: Return if symptoms worsen or fail to improve.    Objective: Vital Signs: There were no vitals taken for this visit.  Physical Exam Vitals and nursing note reviewed.  Constitutional:      Appearance: Normal appearance.  HENT:     Head: Normocephalic and atraumatic.  Eyes:     General: No scleral icterus.       Right eye: No discharge.        Left eye: No discharge.     Extraocular  Movements: Extraocular movements intact.     Conjunctiva/sclera: Conjunctivae normal.     Pupils: Pupils are equal, round, and reactive to light.  Cardiovascular:     Rate and Rhythm: Normal rate.     Pulses: Normal pulses.  Skin:    General: Skin is warm and dry.     Capillary Refill: Capillary refill takes less than 2 seconds.  Neurological:     General: No focal deficit present.     Mental Status: She is alert and oriented to person, place, and time.  Psychiatric:        Mood and Affect: Mood normal.        Behavior: Behavior normal.        Thought Content: Thought content normal.        Judgment: Judgment normal.      Ortho Exam  Left thumb mild pain over the [facility] joint but most of her tenderness 90% of it is over the first extensor compartment and there is a positive DeQuervains Finkelstein's test   Specialty Comments:  No specialty comments available.  Imaging: No results found.   PMFS History: Patient Active Problem List   Diagnosis Date Noted   Low  vitamin D level 04/13/2023   Chronic idiopathic constipation 04/13/2023   History of left breast cancer 04/08/2023   Displaced intertrochanteric fracture of left femur, initial encounter for closed fracture (HCC) 12/17/2021   Constipation 04/02/2021   Ulcerative pancolitis without complication (HCC) 03/22/2019   Breast cancer, stage 1, left (HCC) 08/19/2018   Breast cancer of upper-inner quadrant of left female breast (HCC) 07/21/2018   Recurrent deep vein thrombosis (DVT) of right lower extremity (HCC) 03/08/2018   RBBB 03/08/2018   Hx of colonic polyps 03/14/2016   S/P IVC filter    Leukocytosis 07/12/2012   Hypotension 07/12/2012   Hyperglycemia 07/12/2012   Anemia 06/28/2012   Hematochezia 06/09/2012   Hyponatremia 06/07/2012   Dehydration 06/07/2012   DVT (deep venous thrombosis) (HCC) 11/05/2011   Past Medical History:  Diagnosis Date   Adult idiopathic generalized osteoporosis    Arthritis     "minor" (08/18/2018)   Breast cancer, left breast (HCC) 1998   lumpectomy   Breast cancer, right breast (HCC) 2009   lumpectomy; mastectomy   DVT (deep venous thrombosis) (HCC) "early 2000's"   RLE   DVT (deep venous thrombosis) (HCC)    Hemorrhoids, external    History of blood transfusion    "related to ulcerative colitis"   Lower abdominal pain    Occult blood in stools    Peripheral edema    Seizures (HCC)    one very slight seizure with the stroke; somewhere between 2005-2009   Stroke [facility]) 2005-2009   "very light;" ; denies residual on 08/19/2018   UC (ulcerative colitis) (HCC) dx'd 1957   Warfarin-induced coagulopathy (HCC) 06/09/2012    Family History  Problem Relation Age of Onset   Prostate cancer Father    Colon cancer Neg Hx     Past Surgical History:  Procedure Laterality Date   APPENDECTOMY     BIOPSY  04/14/2019   Procedure: BIOPSY;  Surgeon: Malissa Hippo, MD;  [facility];  Service: Endoscopy;;   bone density  12/11/10   BREAST BIOPSY Left 1998; 2019 X 2   BREAST BIOPSY Right 2009   BREAST LUMPECTOMY Left 1998   CATARACT EXTRACTION W/ INTRAOCULAR LENS  IMPLANT, BILATERAL Bilateral    COLONOSCOPY  02/21/2011   COLONOSCOPY  10/06/2008   COLONOSCOPY  12/27/01   COLONOSCOPY  05/09/05   COLONOSCOPY N/A 07/02/2016   Procedure: COLONOSCOPY;  Surgeon: Malissa Hippo, MD;  [facility];  Service: Endoscopy;  Laterality: N/A;  1055   COLONOSCOPY N/A 07/04/2021   Procedure: COLONOSCOPY;  Surgeon: Malissa Hippo, MD;  [facility];  Service: Endoscopy;  Laterality: N/A;  7:30   EXPLORATORY LAPAROTOMY  1958   "I was having rectal bleeding"   FLEXIBLE SIGMOIDOSCOPY N/A 04/14/2019   Procedure: FLEXIBLE SIGMOIDOSCOPY;  Surgeon: Malissa Hippo, MD;  [facility];  Service: Endoscopy;  Laterality: N/A;  1:00   FRACTURE SURGERY     INTRAMEDULLARY (IM) NAIL INTERTROCHANTERIC Left 12/19/2021   Procedure: INTRAMEDULLARY (IM)  NAIL INTERTROCHANTRIC;  Surgeon: Vickki Hearing, MD;  Location: [facility];  Service: Orthopedics;  Laterality: Left;   MASTECTOMY Right 2009   MASTECTOMY COMPLETE / SIMPLE Left 08/19/2018   POLYPECTOMY  07/04/2021   Procedure: POLYPECTOMY;  Surgeon: Malissa Hippo, MD;  [facility];  Service: Endoscopy;;   PORT-A-CATH REMOVAL N/A 04/08/2023   Procedure: MINOR REMOVAL PORT-A-CATH;  Surgeon: Franky Macho, MD;  Location: [facility];  Service: General;  Laterality: N/A;   PORTACATH PLACEMENT Right 10/15/2018   Procedure: INSERTION PORT-A-CATH RIGHT SUBCLAVIN;  Surgeon: Franky Macho, MD;  Location: [facility];  Service: General;  Laterality: Right;  pt knows to arrive at 7:30   SIMPLE MASTECTOMY WITH AXILLARY SENTINEL NODE BIOPSY Left 08/19/2018   Procedure: LEFT SIMPLE MASTECTOMY;  Surgeon: Harriette Bouillon, MD;  Location: [facility];  Service: General;  Laterality: Left;   TONSILLECTOMY     VENA CAVA FILTER PLACEMENT Right    WRIST FRACTURE SURGERY Right    Social History   Occupational History   Occupation: Retail buyer    Employer: [employer]  Tobacco Use   Smoking status: Former    Current packs/day: 0.00    Average packs/day: 2.0 packs/day for 40.0 years (80.0 ttl pk-yrs)    Types: Cigarettes    Start date: 05/05/1961    Quit date: 05/05/2001    Years since quitting: 22.2    Passive exposure: Never   Smokeless tobacco: Never  Vaping Use   Vaping status: Never Used  Substance and Sexual Activity   Alcohol use: Yes    Comment: 08/18/2018 "couple drinks/year"   Drug use: Never   Sexual activity: Not Currently

## 2023-07-24 NOTE — Addendum Note (Signed)
Addended by: Michaele Offer on: 07/24/2023 09:36 AM   Modules accepted: Orders

## 2023-08-25 DIAGNOSIS — M79676 Pain in unspecified toe(s): Secondary | ICD-10-CM | POA: Diagnosis not present

## 2023-08-25 DIAGNOSIS — B351 Tinea unguium: Secondary | ICD-10-CM | POA: Diagnosis not present

## 2023-08-25 DIAGNOSIS — E1142 Type 2 diabetes mellitus with diabetic polyneuropathy: Secondary | ICD-10-CM | POA: Diagnosis not present

## 2023-08-25 DIAGNOSIS — L84 Corns and callosities: Secondary | ICD-10-CM | POA: Diagnosis not present

## 2023-09-14 ENCOUNTER — Inpatient Hospital Stay: Payer: Medicare PPO | Attending: Hematology

## 2023-09-14 DIAGNOSIS — C50212 Malignant neoplasm of upper-inner quadrant of left female breast: Secondary | ICD-10-CM | POA: Diagnosis not present

## 2023-09-14 DIAGNOSIS — Z170 Estrogen receptor positive status [ER+]: Secondary | ICD-10-CM | POA: Diagnosis not present

## 2023-09-14 DIAGNOSIS — Z79811 Long term (current) use of aromatase inhibitors: Secondary | ICD-10-CM | POA: Diagnosis not present

## 2023-09-14 LAB — COMPREHENSIVE METABOLIC PANEL
ALT: 15 U/L (ref 0–44)
AST: 19 U/L (ref 15–41)
Albumin: 4.1 g/dL (ref 3.5–5.0)
Alkaline Phosphatase: 36 U/L — ABNORMAL LOW (ref 38–126)
Anion gap: 8 (ref 5–15)
BUN: 17 mg/dL (ref 8–23)
CO2: 29 mmol/L (ref 22–32)
Calcium: 9.8 mg/dL (ref 8.9–10.3)
Chloride: 101 mmol/L (ref 98–111)
Creatinine, Ser: 1.02 mg/dL — ABNORMAL HIGH (ref 0.44–1.00)
GFR, Estimated: 54 mL/min — ABNORMAL LOW (ref >60)
Glucose, Bld: 96 mg/dL (ref 70–99)
Potassium: 4.2 mmol/L (ref 3.5–5.1)
Sodium: 138 mmol/L (ref 135–145)
Total Bilirubin: 0.9 mg/dL (ref 0.0–1.2)
Total Protein: 7.9 g/dL (ref 6.5–8.1)

## 2023-09-14 LAB — CBC WITH DIFFERENTIAL/PLATELET
Abs Immature Granulocytes: 0.02 10*3/uL (ref 0.00–0.07)
Basophils Absolute: 0 10*3/uL (ref 0.0–0.1)
Basophils Relative: 1 %
Eosinophils Absolute: 0.2 10*3/uL (ref 0.0–0.5)
Eosinophils Relative: 2 %
HCT: 48.3 % — ABNORMAL HIGH (ref 36.0–46.0)
Hemoglobin: 16.1 g/dL — ABNORMAL HIGH (ref 12.0–15.0)
Immature Granulocytes: 0 %
Lymphocytes Relative: 30 %
Lymphs Abs: 2.4 10*3/uL (ref 0.7–4.0)
MCH: 32.7 pg (ref 26.0–34.0)
MCHC: 33.3 g/dL (ref 30.0–36.0)
MCV: 98 fL (ref 80.0–100.0)
Monocytes Absolute: 0.7 10*3/uL (ref 0.1–1.0)
Monocytes Relative: 9 %
Neutro Abs: 4.7 10*3/uL (ref 1.7–7.7)
Neutrophils Relative %: 58 %
Platelets: 210 10*3/uL (ref 150–400)
RBC: 4.93 MIL/uL (ref 3.87–5.11)
RDW: 12.6 % (ref 11.5–15.5)
WBC: 7.9 10*3/uL (ref 4.0–10.5)
nRBC: 0 % (ref 0.0–0.2)

## 2023-09-14 LAB — VITAMIN D 25 HYDROXY (VIT D DEFICIENCY, FRACTURES): Vit D, 25-Hydroxy: 78.01 ng/mL (ref 30–100)

## 2023-09-20 NOTE — Progress Notes (Signed)
[facility] [address], [place] [address]    Clinic Day:  09/21/2023  Referring physician: Rebekah Chesterfield, NP  Patient Care Team: Rebekah Chesterfield, NP as PCP - General (Internal Medicine)   ASSESSMENT & PLAN:   Assessment: 1.  Stage I HER-2 positive left breast cancer: -Left mastectomy on 08/19/2018, 1.1 cm IDC, grade 1, margins negative.  No lymph nodes were identified.  ER positive/PR positive/HER 2 3+. -12 doses of weekly paclitaxel and Herceptin from 10/22/2018 through 01/14/2019. -Herceptin every 3 weeks started on 02/03/2019 her last dose was on 10/21/2019. -Anastrozole started in December 2019, made her confused and forgetful. -Switched to Femara on 02/03/2019, which is causing vaginal irritation that has worsened over the past 6 months. - She was switched to exemestane.   2.  Osteopenia: -Last DEXA scan on 02/08/2018 showed a T score of -1.2. -She is on Prolia every 6 months with Dr. Charm Barges.   3.  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 -She denies any bleeding episodes.   4.  Ulcerative colitis: -She is on Humira for the past few years.  She is off of mesalamine. -She is following up with Dr. Karilyn Cota   5.  Right breast cancer: - Right mastectomy on 05/15/2008: 1.1 cm invasive lobular carcinoma, pT1c N0 (I+) - She did not receive any antiestrogen therapy.    Plan: 1.  Stage I HER-2 positive left breast cancer: - Physical exam: Bilateral mastectomy sites are within normal limits.  No palpable adenopathy. - She had port discontinued. - Reviewed labs from 09/14/2023: Normal LFTs.  CBC shows mildly elevated hemoglobin and hematocrit.  Mild CKD stable. - She may discontinue exemestane as she has taken it for 5 years.  Recommend follow-up in 1 year with repeat labs and exam.   2.  Osteopenia: - DEXA on 12/19/2020 with T-score -1.3. - DEXA on 03/02/2023 with T-score -0.9. - Continue Prolia every 6 months with  her PMD.  Next injection scheduled next week.  Calcium is normal.   3.  Recurrent DVTs: - Continue Eliquis.  No bleeding issues reported.    Orders Placed This Encounter  Procedures   CBC with Differential    Standing Status:   Future    Expected Date:   09/13/2024    Expiration Date:   09/20/2024   Comprehensive metabolic panel    Standing Status:   Future    Expected Date:   09/13/2024    Expiration Date:   09/20/2024   VITAMIN D 25 Hydroxy (Vit-D Deficiency, Fractures)    Standing Status:   Future    Expected Date:   09/13/2024    Expiration Date:   09/20/2024      I,Katie Daubenspeck,acting as a scribe for Doreatha Massed, MD.,have documented all relevant documentation on the behalf of Doreatha Massed, MD,as directed by  Doreatha Massed, MD while in the presence of Doreatha Massed, MD.   I, Doreatha Massed MD, have reviewed the above documentation for accuracy and completeness, and I agree with the above.   Doreatha Massed, MD   2/3/202511:25 AM  CHIEF COMPLAINT:   Diagnosis: left breast cancer, HER2 positive   Cancer Staging  No matching staging information was found for the patient.    Prior Therapy: 1. Left mastectomy, 08/19/2018 2. weekly paclitaxel and Herceptin, 12 cycles, 10/22/2018 - 01/14/2019  3. Herceptin, 02/03/2019 - 10/21/2019  Current Therapy:  antiestrogen therapy, currently exemestane  HISTORY OF PRESENT ILLNESS:   Oncology History  Breast cancer of upper-inner quadrant of left female breast (HCC)  07/21/2018 Initial Diagnosis   Breast cancer of upper-inner quadrant of left female breast (HCC)   10/22/2018 - 10/21/2019 Chemotherapy   Patient is on Treatment Plan : BREAST weekly PACLitaxel / trastuzumab / Maintenance trastuzumab every 21 days        INTERVAL HISTORY:   Monique Day is a 88 y.o. female presenting to clinic today for follow up of left breast cancer, HER2 positive. She was last seen by me on 03/09/23.  Of note since her last  visit, she had her port removed on 04/08/23.  Today, she states that she is doing well overall. Her appetite level is at 100%. Her energy level is at 75%.  PAST MEDICAL HISTORY:   Past Medical History: Past Medical History:  Diagnosis Date   Adult idiopathic generalized osteoporosis    Arthritis    "minor" (08/18/2018)   Breast cancer, left breast (HCC) 1998   lumpectomy   Breast cancer, right breast (HCC) 2009   lumpectomy; mastectomy   DVT (deep venous thrombosis) (HCC) "early 2000's"   RLE   DVT (deep venous thrombosis) (HCC)    Hemorrhoids, external    History of blood transfusion    "related to ulcerative colitis"   Lower abdominal pain    Occult blood in stools    Peripheral edema    Seizures (HCC)    one very slight seizure with the stroke; somewhere between 2005-2009   Stroke [facility]) 2005-2009   "very light;" ; denies residual on 08/19/2018   UC (ulcerative colitis) (HCC) dx'd 1957   Warfarin-induced coagulopathy (HCC) 06/09/2012    Surgical History: Past Surgical History:  Procedure Laterality Date   APPENDECTOMY     BIOPSY  04/14/2019   Procedure: BIOPSY;  Surgeon: Malissa Hippo, MD;  [facility];  Service: Endoscopy;;   bone density  12/11/10   BREAST BIOPSY Left 1998; 2019 X 2   BREAST BIOPSY Right 2009   BREAST LUMPECTOMY Left 1998   CATARACT EXTRACTION W/ INTRAOCULAR LENS  IMPLANT, BILATERAL Bilateral    COLONOSCOPY  02/21/2011   COLONOSCOPY  10/06/2008   COLONOSCOPY  12/27/01   COLONOSCOPY  05/09/05   COLONOSCOPY N/A 07/02/2016   Procedure: COLONOSCOPY;  Surgeon: Malissa Hippo, MD;  [facility];  Service: Endoscopy;  Laterality: N/A;  1055   COLONOSCOPY N/A 07/04/2021   Procedure: COLONOSCOPY;  Surgeon: Malissa Hippo, MD;  [facility]: [facility];  Service: Endoscopy;  Laterality: N/A;  7:30   EXPLORATORY LAPAROTOMY  1958   "I was having rectal bleeding"   FLEXIBLE SIGMOIDOSCOPY N/A 04/14/2019   Procedure: FLEXIBLE  SIGMOIDOSCOPY;  Surgeon: Malissa Hippo, MD;  [facility]: [facility];  Service: Endoscopy;  Laterality: N/A;  1:00   FRACTURE SURGERY     INTRAMEDULLARY (IM) NAIL INTERTROCHANTERIC Left 12/19/2021   Procedure: INTRAMEDULLARY (IM) NAIL INTERTROCHANTRIC;  Surgeon: Vickki Hearing, MD;  Location: [facility];  Service: Orthopedics;  Laterality: Left;   MASTECTOMY Right 2009   MASTECTOMY COMPLETE / SIMPLE Left 08/19/2018   POLYPECTOMY  07/04/2021   Procedure: POLYPECTOMY;  Surgeon: Malissa Hippo, MD;  [facility]: [facility];  Service: Endoscopy;;   PORT-A-CATH REMOVAL N/A 04/08/2023   Procedure: MINOR REMOVAL PORT-A-CATH;  Surgeon: Franky Macho, MD;  Location: [facility];  Service: General;  Laterality: N/A;   PORTACATH PLACEMENT Right 10/15/2018   Procedure: INSERTION PORT-A-CATH RIGHT  SUBCLAVIN;  Surgeon: Franky Macho, MD;  Location: [facility];  Service: General;  Laterality: Right;  pt knows to arrive at 7:30   SIMPLE MASTECTOMY WITH AXILLARY SENTINEL NODE BIOPSY Left 08/19/2018   Procedure: LEFT SIMPLE MASTECTOMY;  Surgeon: Harriette Bouillon, MD;  Location: [facility];  Service: General;  Laterality: Left;   TONSILLECTOMY     VENA CAVA FILTER PLACEMENT Right    WRIST FRACTURE SURGERY Right     Social History: Social History   Socioeconomic History   Marital status: Widowed    Spouse name: Not on file   Number of children: 0   Years of education: Not on file   Highest education level: Not on file  Occupational History   Occupation: Retail buyer    Employer: [employer]  Tobacco Use   Smoking status: Former    Current packs/day: 0.00    Average packs/day: 2.0 packs/day for 40.0 years (80.0 ttl pk-yrs)    Types: Cigarettes    Start date: 05/05/1961    Quit date: 05/05/2001    Years since quitting: 22.3    Passive exposure: Never   Smokeless tobacco: Never  Vaping Use   Vaping status: Never Used  Substance and Sexual Activity   Alcohol use: Yes    Comment: 08/18/2018  "couple drinks/year"   Drug use: Never   Sexual activity: Not Currently  Other Topics Concern   Not on file  Social History Narrative   Not on file   Social Drivers of Health   Financial Resource Strain: Low Risk  (07/21/2018)   Overall Financial Resource Strain (CARDIA)    Difficulty of Paying Living Expenses: Not hard at all  Food Insecurity: No Food Insecurity (07/21/2018)   Hunger Vital Sign    Worried About Running Out of Food in the Last Year: Never true    Ran Out of Food in the Last Year: Never true  Transportation Needs: No Transportation Needs (07/21/2018)   PRAPARE - Administrator, Civil Service (Medical): No    Lack of Transportation (Non-Medical): No  Physical Activity: Inactive (07/21/2018)   Exercise Vital Sign    Days of Exercise per Week: 0 days    Minutes of Exercise per Session: 0 min  Stress: No Stress Concern Present (07/21/2018)   [employer] of Occupational Health - Occupational Stress Questionnaire    Feeling of Stress : Not at all  Social Connections: Moderately Integrated (07/21/2018)   Social Connection and Isolation Panel [NHANES]    Frequency of Communication with Friends and Family: More than three times a week    Frequency of Social Gatherings with Friends and Family: More than three times a week    Attends Religious Services: More than 4 times per year    Active Member of [employer] or Organizations: Yes    Attends Banker Meetings: More than 4 times per year    Marital Status: Widowed  Intimate Partner Violence: Not At Risk (07/21/2018)   Humiliation, Afraid, Rape, and Kick questionnaire    Fear of Current or Ex-Partner: No    Emotionally Abused: No    Physically Abused: No    Sexually Abused: No    Family History: Family History  Problem Relation Age of Onset   Prostate cancer Father    Colon cancer Neg Hx     Current Medications:  Current Outpatient Medications:    Adalimumab (HUMIRA PEN) 40 MG/0.8ML PNKT,  Inject 40 mg into the skin every 14 (fourteen)  days. INJECT 40 MG EVERY 14 DAYS AS DIRECTED, Disp: 2 each, Rfl: 11   Calcium Carb-Cholecalciferol (CALCIUM 600 + D PO), Take 1 tablet by mouth daily., Disp: , Rfl:    denosumab (PROLIA) 60 MG/ML SOLN injection, Inject 60 mg into the skin every 6 (six) months. Administer in upper arm, thigh, or abdomen, Disp: , Rfl:    ELIQUIS 2.5 MG TABS tablet, Take 1 tablet (2.5 mg total) by mouth 2 (two) times daily., Disp: 60 tablet, Rfl:    Ergocalciferol 10 MCG (400 UNIT) TABS, Take 1 each by mouth daily. (Patient taking differently: Take 2,000 Units by mouth daily.), Disp: 90 tablet, Rfl: 3   exemestane (AROMASIN) 25 MG tablet, take 1 tablet daily after breakfast., Disp: 30 tablet, Rfl: 6   FARXIGA 10 MG TABS tablet, Take 10 mg by mouth daily., Disp: , Rfl:    MAGNESIUM PO, Take 500 mg by mouth daily., Disp: , Rfl:    Multiple Vitamins-Minerals (PRESERVISION AREDS PO), Take 1 capsule by mouth daily., Disp: , Rfl:    polyethylene glycol (MIRALAX / GLYCOLAX) 17 g packet, Take 17 g by mouth 2 (two) times daily. (Patient taking differently: Take 17 g by mouth daily.), Disp: 14 each, Rfl: 0   PRESCRIPTION MEDICATION, Apply 1 application  topically daily as needed (irritation). [facility] Fluticasone 0.05 % and Ketoconazole 2% 1:1, Disp: , Rfl:  No current facility-administered medications for this visit.  Facility-Administered Medications Ordered in Other Visits:    sodium chloride flush (NS) 0.9 % injection 10 mL, 10 mL, Intravenous, PRN, Doreatha Massed, MD, 10 mL at 07/24/21 1429   Allergies: Allergies  Allergen Reactions   Mercaptopurine Other (See Comments)    Dropped WBC   Sulfa Antibiotics Other (See Comments)    Extreme Weakness    REVIEW OF SYSTEMS:   Review of Systems  Constitutional:  Negative for chills, fatigue and fever.  HENT:   Negative for lump/mass, mouth sores, nosebleeds, sore throat and trouble swallowing.    Eyes:  Negative for eye problems.  Respiratory:  Positive for shortness of breath. Negative for cough.   Cardiovascular:  Negative for chest pain, leg swelling and palpitations.  Gastrointestinal:  Negative for abdominal pain, constipation, diarrhea, nausea and vomiting.  Genitourinary:  Negative for bladder incontinence, difficulty urinating, dysuria, frequency, hematuria and nocturia.   Musculoskeletal:  Negative for arthralgias, back pain, flank pain, myalgias and neck pain.  Skin:  Negative for itching and rash.  Neurological:  Negative for dizziness, headaches and numbness.  Hematological:  Does not bruise/bleed easily.  Psychiatric/Behavioral:  Positive for sleep disturbance. Negative for depression and suicidal ideas. The patient is not nervous/anxious.   All other systems reviewed and are negative.    VITALS:   Blood pressure (!) 162/90, pulse 79, temperature (!) 97.5 F (36.4 C), temperature source Tympanic, resp. rate 20, weight 170 lb 10.2 oz (77.4 kg), SpO2 96%.  Wt Readings from Last 3 Encounters:  09/21/23 170 lb 10.2 oz (77.4 kg)  04/13/23 172 lb (78 kg)  04/02/23 173 lb (78.5 kg)    Body mass index is 30.71 kg/m.  Performance status (ECOG): 1 - Symptomatic but completely ambulatory  PHYSICAL EXAM:   Physical Exam Vitals and nursing note reviewed. Exam conducted with a chaperone present.  Constitutional:      Appearance: Normal appearance.  Cardiovascular:     Rate and Rhythm: Normal rate and regular rhythm.     Pulses: Normal pulses.  Heart sounds: Normal heart sounds.  Pulmonary:     Effort: Pulmonary effort is normal.     Breath sounds: Normal breath sounds.  Abdominal:     Palpations: Abdomen is soft. There is no hepatomegaly, splenomegaly or mass.     Tenderness: There is no abdominal tenderness.  Musculoskeletal:     Right lower leg: No edema.     Left lower leg: No edema.  Lymphadenopathy:     Cervical: No cervical adenopathy.     Right  cervical: No superficial, deep or posterior cervical adenopathy.    Left cervical: No superficial, deep or posterior cervical adenopathy.     Upper Body:     Right upper body: No supraclavicular or axillary adenopathy.     Left upper body: No supraclavicular or axillary adenopathy.  Neurological:     General: No focal deficit present.     Mental Status: She is alert and oriented to person, place, and time.  Psychiatric:        Mood and Affect: Mood normal.        Behavior: Behavior normal.     LABS:   CBC     Component Value Date/Time   WBC 7.9 09/14/2023 0950   RBC 4.93 09/14/2023 0950   HGB 16.1 (H) 09/14/2023 0950   HGB 14.4 03/10/2017 1254   HCT 48.3 (H) 09/14/2023 0950   HCT 41.5 03/10/2017 1254   PLT 210 09/14/2023 0950   PLT 249 03/10/2017 1254   MCV 98.0 09/14/2023 0950   MCV 91 03/10/2017 1254   MCH 32.7 09/14/2023 0950   MCHC 33.3 09/14/2023 0950   RDW 12.6 09/14/2023 0950   RDW 13.3 03/10/2017 1254   LYMPHSABS 2.4 09/14/2023 0950   LYMPHSABS 2.7 03/10/2017 1254   MONOABS 0.7 09/14/2023 0950   EOSABS 0.2 09/14/2023 0950   EOSABS 0.4 03/10/2017 1254   BASOSABS 0.0 09/14/2023 0950   BASOSABS 0.1 03/10/2017 1254    CMP      Component Value Date/Time   NA 138 09/14/2023 0950   NA 141 03/10/2017 1254   K 4.2 09/14/2023 0950   CL 101 09/14/2023 0950   CO2 29 09/14/2023 0950   GLUCOSE 96 09/14/2023 0950   BUN 17 09/14/2023 0950   BUN 19 03/10/2017 1254   CREATININE 1.02 (H) 09/14/2023 0950   CREATININE 0.92 01/01/2016 1125   CALCIUM 9.8 09/14/2023 0950   PROT 7.9 09/14/2023 0950   PROT 7.1 03/10/2017 1254   ALBUMIN 4.1 09/14/2023 0950   ALBUMIN 4.0 03/10/2017 1254   AST 19 09/14/2023 0950   ALT 15 09/14/2023 0950   ALKPHOS 36 (L) 09/14/2023 0950   BILITOT 0.9 09/14/2023 0950   BILITOT 0.3 03/10/2017 1254   GFRNONAA 54 (L) 09/14/2023 0950   GFRAA >60 02/23/2020 1124     No results found for: "CEA1", "CEA" / No results found for: "CEA1",  "CEA" No results found for: "PSA1" No results found for: "GEX528" No results found for: "CAN125"  No results found for: "TOTALPROTELP", "ALBUMINELP", "A1GS", "A2GS", "BETS", "BETA2SER", "GAMS", "MSPIKE", "SPEI" No results found for: "TIBC", "FERRITIN", "IRONPCTSAT" Lab Results  Component Value Date   LDH 137 07/24/2021   LDH 150 08/23/2020   LDH 137 06/29/2020     STUDIES:   No results found.

## 2023-09-21 ENCOUNTER — Inpatient Hospital Stay: Payer: Medicare PPO | Attending: Hematology | Admitting: Hematology

## 2023-09-21 VITALS — BP 162/90 | HR 79 | Temp 97.5°F | Resp 20 | Wt 170.6 lb

## 2023-09-21 DIAGNOSIS — K5190 Ulcerative colitis, unspecified, without complications: Secondary | ICD-10-CM | POA: Diagnosis not present

## 2023-09-21 DIAGNOSIS — Z79811 Long term (current) use of aromatase inhibitors: Secondary | ICD-10-CM | POA: Insufficient documentation

## 2023-09-21 DIAGNOSIS — N898 Other specified noninflammatory disorders of vagina: Secondary | ICD-10-CM | POA: Insufficient documentation

## 2023-09-21 DIAGNOSIS — Z8042 Family history of malignant neoplasm of prostate: Secondary | ICD-10-CM | POA: Diagnosis not present

## 2023-09-21 DIAGNOSIS — Z8673 Personal history of transient ischemic attack (TIA), and cerebral infarction without residual deficits: Secondary | ICD-10-CM | POA: Insufficient documentation

## 2023-09-21 DIAGNOSIS — R0602 Shortness of breath: Secondary | ICD-10-CM | POA: Diagnosis not present

## 2023-09-21 DIAGNOSIS — C50212 Malignant neoplasm of upper-inner quadrant of left female breast: Secondary | ICD-10-CM | POA: Insufficient documentation

## 2023-09-21 DIAGNOSIS — Z79899 Other long term (current) drug therapy: Secondary | ICD-10-CM | POA: Diagnosis not present

## 2023-09-21 DIAGNOSIS — Z87891 Personal history of nicotine dependence: Secondary | ICD-10-CM | POA: Insufficient documentation

## 2023-09-21 DIAGNOSIS — Z9013 Acquired absence of bilateral breasts and nipples: Secondary | ICD-10-CM | POA: Insufficient documentation

## 2023-09-21 DIAGNOSIS — M8580 Other specified disorders of bone density and structure, unspecified site: Secondary | ICD-10-CM | POA: Diagnosis not present

## 2023-09-21 DIAGNOSIS — N182 Chronic kidney disease, stage 2 (mild): Secondary | ICD-10-CM | POA: Diagnosis not present

## 2023-09-21 DIAGNOSIS — Z8719 Personal history of other diseases of the digestive system: Secondary | ICD-10-CM | POA: Diagnosis not present

## 2023-09-21 DIAGNOSIS — Z1721 Progesterone receptor positive status: Secondary | ICD-10-CM | POA: Insufficient documentation

## 2023-09-21 DIAGNOSIS — Z170 Estrogen receptor positive status [ER+]: Secondary | ICD-10-CM | POA: Diagnosis not present

## 2023-09-21 DIAGNOSIS — Z86718 Personal history of other venous thrombosis and embolism: Secondary | ICD-10-CM | POA: Diagnosis not present

## 2023-09-21 DIAGNOSIS — Z7901 Long term (current) use of anticoagulants: Secondary | ICD-10-CM | POA: Insufficient documentation

## 2023-09-21 DIAGNOSIS — Z882 Allergy status to sulfonamides status: Secondary | ICD-10-CM | POA: Diagnosis not present

## 2023-09-21 DIAGNOSIS — G479 Sleep disorder, unspecified: Secondary | ICD-10-CM | POA: Insufficient documentation

## 2023-09-21 DIAGNOSIS — Z9049 Acquired absence of other specified parts of digestive tract: Secondary | ICD-10-CM | POA: Diagnosis not present

## 2023-09-21 NOTE — Patient Instructions (Signed)
[facility] Discharge Instructions   You were seen and examined today by Dr. Ellin Saba.  He reviewed the results of your lab work which are normal/stable.   You completed 5 years of anti-estrogen therapy. You may stop the exemestane.   Return as scheduled.    Thank you for choosing [facility] at [facility] to provide your oncology and hematology care.  To afford each patient quality time with our provider, please arrive at least 15 minutes before your scheduled appointment time.   If you have a lab appointment with the Cancer Center please come in thru the Main Entrance and check in at the main information desk.  You need to re-schedule your appointment should you arrive 10 or more minutes late.  We strive to give you quality time with our providers, and arriving late affects you and other patients whose appointments are after yours.  Also, if you no show three or more times for appointments you may be dismissed from the clinic at the providers discretion.     Again, thank you for choosing [facility].  Our hope is that these requests will decrease the amount of time that you wait before being seen by our physicians.       _____________________________________________________________  Should you have questions after your visit to [facility], please contact our office at [phone] and follow the prompts.  Our office hours are 8:00 a.m. and 4:30 p.m. Monday - Friday.  Please note that voicemails left after 4:00 p.m. may not be returned until the following business day.  We are closed weekends and major holidays.  You do have access to a nurse 24-7, just call the main number to the clinic [phone] and do not press any options, hold on the line and a nurse will answer the phone.    For prescription refill requests, have your pharmacy contact our office and allow 72 hours.    Due to Covid, you will  need to wear a mask upon entering the hospital. If you do not have a mask, a mask will be given to you at the Main Entrance upon arrival. For doctor visits, patients may have 1 support person age 86 or older with them. For treatment visits, patients can not have anyone with them due to social distancing guidelines and our immunocompromised population.

## 2023-10-01 ENCOUNTER — Ambulatory Visit (INDEPENDENT_AMBULATORY_CARE_PROVIDER_SITE_OTHER): Payer: Medicare PPO | Admitting: Gastroenterology

## 2023-10-01 ENCOUNTER — Encounter (INDEPENDENT_AMBULATORY_CARE_PROVIDER_SITE_OTHER): Payer: Self-pay | Admitting: Gastroenterology

## 2023-10-01 VITALS — BP 132/81 | HR 80 | Temp 97.5°F | Ht 62.5 in | Wt 175.5 lb

## 2023-10-01 DIAGNOSIS — K5100 Ulcerative (chronic) pancolitis without complications: Secondary | ICD-10-CM

## 2023-10-01 DIAGNOSIS — R12 Heartburn: Secondary | ICD-10-CM

## 2023-10-01 DIAGNOSIS — K219 Gastro-esophageal reflux disease without esophagitis: Secondary | ICD-10-CM | POA: Insufficient documentation

## 2023-10-01 MED ORDER — OMEPRAZOLE 20 MG PO CPDR: 20.0000 mg | DELAYED_RELEASE_CAPSULE | Freq: Every day | ORAL | 3 refills | Status: DC

## 2023-10-01 NOTE — Patient Instructions (Signed)
Start Omeprazole 20 mg qday Continue Humira every 2 weeks Schedule follow-up appointment with dermatologist

## 2023-10-01 NOTE — Progress Notes (Signed)
Monique Day, M.D. Gastroenterology & Hepatology [facility] Gastroenterology [address] [place], [place] [address]  Primary Care Physician: Rebekah Chesterfield, NP [address] [place] [address] [place] [address]  I will communicate my assessment and recommendations to the referring MD via EMR.  Problems: Ulcerative colitis Low vitamin D CIC   History of Present Illness: Monique Day is a 88 y.o. female with past medical history of  DVT, ulcerative pancolitis, seizures, breast cancer status post mastectomy, arthritis, who comes for follow up ulcerative colitis.  The patient was last seen on 04/13/23. At that time, the patient was continued on Humira every 2 weeks.  Patient has noticed for the last couple of weeks her abdomen has been rumbling more. The patient denies having any nausea, vomiting, fever, chills, hematochezia, melena, hematemesis, abdominal distention, abdominal pain, diarrhea, jaundice, pruritus or weight loss. Has 1-2 Bms per day, stools are soft as she is taking Miralax daily.  She states having some heartburn at night, for which she takes 3 Tums. She is having this 2 times every night. No dysphagia or odynophagia.  Patient had labs recently but these are not available for me to review.  Previous medications: Remicade   Last flu shot: 2024 Last pneumonia shot: yes, had the least potent one but not interested to have other shots Last zoster vaccine: 2021, Shingrix Last DEXA scan: 03/02/2023 -  T score -0.9 normal  Most recent TB testing and hepatitis B surface antigen were performed on 04/13/2023 Dermatology evaluation: many years ago, is willing to make a follow-up appointment with them   Last Colonoscopy: 07/04/2021 - A single non-bleeding colonic angiodysplastic lesion. - Scar in the entire examined colon. - One 6 mm polyp in the distal rectum, removed with a cold snare. Resected and retrieved  Past Medical History: Past Medical  History:  Diagnosis Date   Adult idiopathic generalized osteoporosis    Arthritis    "minor" (08/18/2018)   Breast cancer, left breast (HCC) 1998   lumpectomy   Breast cancer, right breast (HCC) 2009   lumpectomy; mastectomy   DVT (deep venous thrombosis) (HCC) "early 2000's"   RLE   DVT (deep venous thrombosis) (HCC)    Hemorrhoids, external    History of blood transfusion    "related to ulcerative colitis"   Lower abdominal pain    Occult blood in stools    Peripheral edema    Seizures (HCC)    one very slight seizure with the stroke; somewhere between 2005-2009   Stroke [facility]) 2005-2009   "very light;" ; denies residual on 08/19/2018   UC (ulcerative colitis) (HCC) dx'd 1957   Warfarin-induced coagulopathy (HCC) 06/09/2012    Past Surgical History: Past Surgical History:  Procedure Laterality Date   APPENDECTOMY     BIOPSY  04/14/2019   Procedure: BIOPSY;  Surgeon: Malissa Hippo, MD;  [facility];  Service: Endoscopy;;   bone density  12/11/10   BREAST BIOPSY Left 1998; 2019 X 2   BREAST BIOPSY Right 2009   BREAST LUMPECTOMY Left 1998   CATARACT EXTRACTION W/ INTRAOCULAR LENS  IMPLANT, BILATERAL Bilateral    COLONOSCOPY  02/21/2011   COLONOSCOPY  10/06/2008   COLONOSCOPY  12/27/01   COLONOSCOPY  05/09/05   COLONOSCOPY N/A 07/02/2016   Procedure: COLONOSCOPY;  Surgeon: Malissa Hippo, MD;  [facility]: [facility];  Service: Endoscopy;  Laterality: N/A;  1055   COLONOSCOPY N/A 07/04/2021   Procedure: COLONOSCOPY;  Surgeon: Malissa Hippo, MD;  [facility];  Service: Endoscopy;  Laterality: N/A;  7:30   EXPLORATORY LAPAROTOMY  1958   "I was having rectal bleeding"   FLEXIBLE SIGMOIDOSCOPY N/A 04/14/2019   Procedure: FLEXIBLE SIGMOIDOSCOPY;  Surgeon: Malissa Hippo, MD;  [facility];  Service: Endoscopy;  Laterality: N/A;  1:00   FRACTURE SURGERY     INTRAMEDULLARY (IM) NAIL INTERTROCHANTERIC Left 12/19/2021   Procedure:  INTRAMEDULLARY (IM) NAIL INTERTROCHANTRIC;  Surgeon: Vickki Hearing, MD;  Location: [facility];  Service: Orthopedics;  Laterality: Left;   MASTECTOMY Right 2009   MASTECTOMY COMPLETE / SIMPLE Left 08/19/2018   POLYPECTOMY  07/04/2021   Procedure: POLYPECTOMY;  Surgeon: Malissa Hippo, MD;  [facility];  Service: Endoscopy;;   PORT-A-CATH REMOVAL N/A 04/08/2023   Procedure: MINOR REMOVAL PORT-A-CATH;  Surgeon: Franky Macho, MD;  Location: [facility];  Service: General;  Laterality: N/A;   PORTACATH PLACEMENT Right 10/15/2018   Procedure: INSERTION PORT-A-CATH RIGHT SUBCLAVIN;  Surgeon: Franky Macho, MD;  Location: [facility];  Service: General;  Laterality: Right;  pt knows to arrive at 7:30   SIMPLE MASTECTOMY WITH AXILLARY SENTINEL NODE BIOPSY Left 08/19/2018   Procedure: LEFT SIMPLE MASTECTOMY;  Surgeon: Harriette Bouillon, MD;  Location: [facility];  Service: General;  Laterality: Left;   TONSILLECTOMY     VENA CAVA FILTER PLACEMENT Right    WRIST FRACTURE SURGERY Right     Family History: Family History  Problem Relation Age of Onset   Prostate cancer Father    Colon cancer Neg Hx     Social History: Social History   Tobacco Use  Smoking Status Former   Current packs/day: 0.00   Average packs/day: 2.0 packs/day for 40.0 years (80.0 ttl pk-yrs)   Types: Cigarettes   Start date: 05/05/1961   Quit date: 05/05/2001   Years since quitting: 22.4   Passive exposure: Never  Smokeless Tobacco Never   Social History   Substance and Sexual Activity  Alcohol Use Yes   Comment: 08/18/2018 "couple drinks/year"   Social History   Substance and Sexual Activity  Drug Use Never    Allergies: Allergies  Allergen Reactions   Mercaptopurine Other (See Comments)    Dropped WBC   Sulfa Antibiotics Other (See Comments)    Extreme Weakness    Medications: Current Outpatient Medications  Medication Sig Dispense Refill   Adalimumab (HUMIRA PEN) 40 MG/0.8ML PNKT Inject 40 mg into the  skin every 14 (fourteen) days. INJECT 40 MG EVERY 14 DAYS AS DIRECTED 2 each 11   Calcium Carb-Cholecalciferol (CALCIUM 600 + D PO) Take 1 tablet by mouth daily.     denosumab (PROLIA) 60 MG/ML SOLN injection Inject 60 mg into the skin every 6 (six) months. Administer in upper arm, thigh, or abdomen     ELIQUIS 2.5 MG TABS tablet Take 1 tablet (2.5 mg total) by mouth 2 (two) times daily. 60 tablet    FARXIGA 10 MG TABS tablet Take 10 mg by mouth daily.     MAGNESIUM PO Take 500 mg by mouth daily.     Multiple Vitamins-Minerals (PRESERVISION AREDS PO) Take 1 capsule by mouth daily.     OVER THE COUNTER MEDICATION Otc Vit d 50 mg daily.     polyethylene glycol (MIRALAX / GLYCOLAX) 17 g packet Take 17 g by mouth 2 (two) times daily. (Patient taking differently: Take 17 g by mouth daily.) 14 each 0   PRESCRIPTION MEDICATION  Apply 1 application  topically daily as needed (irritation). [facility] Fluticasone 0.05 % and Ketoconazole 2% 1:1     No current facility-administered medications for this visit.   Facility-Administered Medications Ordered in Other Visits  Medication Dose Route Frequency Provider Last Rate Last Admin   sodium chloride flush (NS) 0.9 % injection 10 mL  10 mL Intravenous PRN Doreatha Massed, MD   10 mL at 07/24/21 1429    Review of Systems: GENERAL: negative for malaise, night sweats HEENT: No changes in hearing or vision, no nose bleeds or other nasal problems. NECK: Negative for lumps, goiter, pain and significant neck swelling RESPIRATORY: Negative for cough, wheezing CARDIOVASCULAR: Negative for chest pain, leg swelling, palpitations, orthopnea GI: SEE HPI MUSCULOSKELETAL: Negative for joint pain or swelling, back pain, and muscle pain. SKIN: Negative for lesions, rash PSYCH: Negative for sleep disturbance, mood disorder and recent psychosocial stressors. HEMATOLOGY Negative for prolonged bleeding, bruising easily, and swollen nodes. ENDOCRINE:  Negative for cold or heat intolerance, polyuria, polydipsia and goiter. NEURO: negative for tremor, gait imbalance, syncope and seizures. The remainder of the review of systems is noncontributory.   Physical Exam: BP 132/81 (BP Location: Right Arm, Patient Position: Sitting, Cuff Size: Large)   Pulse 80   Temp (!) 97.5 F (36.4 C) (Temporal)   Ht 5' 2.5" (1.588 m)   Wt 175 lb 8 oz (79.6 kg)   BMI 31.59 kg/m  GENERAL: The patient is AO x3, in no acute distress. HEENT: Head is normocephalic and atraumatic. EOMI are intact. Mouth is well hydrated and without lesions. NECK: Supple. No masses LUNGS: Clear to auscultation. No presence of rhonchi/wheezing/rales. Adequate chest expansion HEART: RRR, normal s1 and s2. ABDOMEN: Soft, nontender, no guarding, no peritoneal signs, and nondistended. BS +. No masses. EXTREMITIES: Without any cyanosis, clubbing, rash, lesions or edema. NEUROLOGIC: AOx3, no focal motor deficit. SKIN: no jaundice, no rashes  Imaging/Labs: as above  I personally reviewed and interpreted the available labs, imaging and endoscopic files.  Impression and Plan: Venora Kautzman is a 88 y.o. female with past medical history of  DVT, ulcerative pancolitis, seizures, breast cancer status post mastectomy, arthritis, who comes for follow up ulcerative colitis.  Patient has been on clinical and endoscopic remission with Humira every 2 weeks.  She denies having any complaints and has been compliant with the medication.  Will continue with the same dosage of Humira for now.  She has been up-to-date in terms of her vaccinations, however I insisted her to make an appointment to follow-up with her dermatologist in soonest available appointment.  Will request most recent surveillance labs she had performed at her PCPs office.  Finally, she has presented some episodes of heartburn intermittently.  Will start her on low-dose omeprazole daily to control her symptoms.  -Start  Omeprazole 20 mg qday -Continue Humira every 2 weeks -Schedule follow-up appointment with dermatologist  All questions were answered.      Monique Blazing, MD Gastroenterology and Hepatology [facility] Gastroenterology

## 2023-10-02 DIAGNOSIS — B372 Candidiasis of skin and nail: Secondary | ICD-10-CM | POA: Diagnosis not present

## 2023-10-02 DIAGNOSIS — M818 Other osteoporosis without current pathological fracture: Secondary | ICD-10-CM | POA: Diagnosis not present

## 2023-10-02 DIAGNOSIS — K51919 Ulcerative colitis, unspecified with unspecified complications: Secondary | ICD-10-CM | POA: Diagnosis not present

## 2023-10-02 DIAGNOSIS — I10 Essential (primary) hypertension: Secondary | ICD-10-CM | POA: Diagnosis not present

## 2023-10-02 DIAGNOSIS — I82401 Acute embolism and thrombosis of unspecified deep veins of right lower extremity: Secondary | ICD-10-CM | POA: Diagnosis not present

## 2023-10-02 DIAGNOSIS — R609 Edema, unspecified: Secondary | ICD-10-CM | POA: Diagnosis not present

## 2023-10-25 ENCOUNTER — Other Ambulatory Visit (INDEPENDENT_AMBULATORY_CARE_PROVIDER_SITE_OTHER): Payer: Self-pay | Admitting: Gastroenterology

## 2023-10-26 NOTE — Telephone Encounter (Signed)
 Last seen 09/30/23 note states continue humira

## 2023-11-03 DIAGNOSIS — L84 Corns and callosities: Secondary | ICD-10-CM | POA: Diagnosis not present

## 2023-11-03 DIAGNOSIS — E1142 Type 2 diabetes mellitus with diabetic polyneuropathy: Secondary | ICD-10-CM | POA: Diagnosis not present

## 2023-11-03 DIAGNOSIS — B351 Tinea unguium: Secondary | ICD-10-CM | POA: Diagnosis not present

## 2023-11-03 DIAGNOSIS — M79676 Pain in unspecified toe(s): Secondary | ICD-10-CM | POA: Diagnosis not present

## 2023-11-09 DIAGNOSIS — H5203 Hypermetropia, bilateral: Secondary | ICD-10-CM | POA: Diagnosis not present

## 2023-11-09 DIAGNOSIS — H524 Presbyopia: Secondary | ICD-10-CM | POA: Diagnosis not present

## 2023-11-09 DIAGNOSIS — H18593 Other hereditary corneal dystrophies, bilateral: Secondary | ICD-10-CM | POA: Diagnosis not present

## 2023-11-09 DIAGNOSIS — Z961 Presence of intraocular lens: Secondary | ICD-10-CM | POA: Diagnosis not present

## 2023-11-09 DIAGNOSIS — H353133 Nonexudative age-related macular degeneration, bilateral, advanced atrophic without subfoveal involvement: Secondary | ICD-10-CM | POA: Diagnosis not present

## 2023-11-09 DIAGNOSIS — H43813 Vitreous degeneration, bilateral: Secondary | ICD-10-CM | POA: Diagnosis not present

## 2023-11-09 DIAGNOSIS — H52223 Regular astigmatism, bilateral: Secondary | ICD-10-CM | POA: Diagnosis not present

## 2023-11-11 ENCOUNTER — Other Ambulatory Visit: Payer: Self-pay

## 2023-11-11 ENCOUNTER — Emergency Department (HOSPITAL_COMMUNITY)

## 2023-11-11 ENCOUNTER — Encounter (HOSPITAL_COMMUNITY): Payer: Self-pay

## 2023-11-11 ENCOUNTER — Emergency Department (HOSPITAL_COMMUNITY)
Admission: EM | Admit: 2023-11-11 | Discharge: 2023-11-11 | Disposition: A | Discharge status: Home / Self Care | Attending: Emergency Medicine | Admitting: Emergency Medicine

## 2023-11-11 DIAGNOSIS — Z8673 Personal history of transient ischemic attack (TIA), and cerebral infarction without residual deficits: Secondary | ICD-10-CM | POA: Insufficient documentation

## 2023-11-11 DIAGNOSIS — I159 Secondary hypertension, unspecified: Secondary | ICD-10-CM | POA: Insufficient documentation

## 2023-11-11 DIAGNOSIS — Z79899 Other long term (current) drug therapy: Secondary | ICD-10-CM | POA: Diagnosis not present

## 2023-11-11 DIAGNOSIS — I10 Essential (primary) hypertension: Secondary | ICD-10-CM | POA: Diagnosis not present

## 2023-11-11 DIAGNOSIS — I4510 Unspecified right bundle-branch block: Secondary | ICD-10-CM | POA: Diagnosis not present

## 2023-11-11 DIAGNOSIS — R55 Syncope and collapse: Secondary | ICD-10-CM | POA: Diagnosis not present

## 2023-11-11 DIAGNOSIS — R42 Dizziness and giddiness: Secondary | ICD-10-CM | POA: Diagnosis not present

## 2023-11-11 DIAGNOSIS — Z7901 Long term (current) use of anticoagulants: Secondary | ICD-10-CM | POA: Diagnosis not present

## 2023-11-11 DIAGNOSIS — R531 Weakness: Secondary | ICD-10-CM | POA: Diagnosis not present

## 2023-11-11 DIAGNOSIS — R918 Other nonspecific abnormal finding of lung field: Secondary | ICD-10-CM | POA: Diagnosis not present

## 2023-11-11 DIAGNOSIS — I6782 Cerebral ischemia: Secondary | ICD-10-CM | POA: Diagnosis not present

## 2023-11-11 DIAGNOSIS — I700 Atherosclerosis of aorta: Secondary | ICD-10-CM | POA: Diagnosis not present

## 2023-11-11 DIAGNOSIS — R2681 Unsteadiness on feet: Secondary | ICD-10-CM | POA: Diagnosis not present

## 2023-11-11 LAB — URINALYSIS, ROUTINE W REFLEX MICROSCOPIC
Bacteria, UA: NONE SEEN
Bilirubin Urine: NEGATIVE
Glucose, UA: >=500 mg/dL — AB
Hgb urine dipstick: NEGATIVE
Ketones, ur: NEGATIVE mg/dL
Leukocytes,Ua: NEGATIVE
Nitrite: NEGATIVE
Protein, ur: NEGATIVE mg/dL
Specific Gravity, Urine: 1.006 (ref 1.005–1.030)
pH: 5 (ref 5.0–8.0)

## 2023-11-11 LAB — CBC WITH DIFFERENTIAL/PLATELET
Abs Immature Granulocytes: 0.03 10*3/uL (ref 0.00–0.07)
Basophils Absolute: 0 10*3/uL (ref 0.0–0.1)
Basophils Relative: 0 %
Eosinophils Absolute: 0.1 10*3/uL (ref 0.0–0.5)
Eosinophils Relative: 2 %
HCT: 46.9 % — ABNORMAL HIGH (ref 36.0–46.0)
Hemoglobin: 15.8 g/dL — ABNORMAL HIGH (ref 12.0–15.0)
Immature Granulocytes: 0 %
Lymphocytes Relative: 27 %
Lymphs Abs: 1.8 10*3/uL (ref 0.7–4.0)
MCH: 32.3 pg (ref 26.0–34.0)
MCHC: 33.7 g/dL (ref 30.0–36.0)
MCV: 95.9 fL (ref 80.0–100.0)
Monocytes Absolute: 0.5 10*3/uL (ref 0.1–1.0)
Monocytes Relative: 7 %
Neutro Abs: 4.3 10*3/uL (ref 1.7–7.7)
Neutrophils Relative %: 64 %
Platelets: 208 10*3/uL (ref 150–400)
RBC: 4.89 MIL/uL (ref 3.87–5.11)
RDW: 12.2 % (ref 11.5–15.5)
WBC: 6.8 10*3/uL (ref 4.0–10.5)
nRBC: 0 % (ref 0.0–0.2)

## 2023-11-11 LAB — TSH: TSH: 1.276 u[IU]/mL (ref 0.350–4.500)

## 2023-11-11 LAB — COMPREHENSIVE METABOLIC PANEL
ALT: 18 U/L (ref 0–44)
AST: 20 U/L (ref 15–41)
Albumin: 4 g/dL (ref 3.5–5.0)
Alkaline Phosphatase: 35 U/L — ABNORMAL LOW (ref 38–126)
Anion gap: 11 (ref 5–15)
BUN: 21 mg/dL (ref 8–23)
CO2: 24 mmol/L (ref 22–32)
Calcium: 8.9 mg/dL (ref 8.9–10.3)
Chloride: 102 mmol/L (ref 98–111)
Creatinine, Ser: 0.95 mg/dL (ref 0.44–1.00)
GFR, Estimated: 59 mL/min — ABNORMAL LOW (ref >60)
Glucose, Bld: 110 mg/dL — ABNORMAL HIGH (ref 70–99)
Potassium: 4.1 mmol/L (ref 3.5–5.1)
Sodium: 137 mmol/L (ref 135–145)
Total Bilirubin: 0.6 mg/dL (ref 0.0–1.2)
Total Protein: 7.9 g/dL (ref 6.5–8.1)

## 2023-11-11 LAB — TROPONIN I (HIGH SENSITIVITY)
Troponin I (High Sensitivity): 4 ng/L (ref <18)
Troponin I (High Sensitivity): 6 ng/L (ref <18)

## 2023-11-11 LAB — LACTIC ACID, PLASMA: Lactic Acid, Venous: 1.4 mmol/L (ref 0.5–1.9)

## 2023-11-11 MED ORDER — SODIUM CHLORIDE 0.9 % IV BOLUS
500.0000 mL | Freq: Once | INTRAVENOUS | Status: AC
  Administered 2023-11-11: 500 mL via INTRAVENOUS

## 2023-11-11 NOTE — ED Triage Notes (Signed)
 Pt complains of dizziness for approximately two days and became worse today while singing in choir. EMS reports pt slightly hypertensive and CBG 108. Pt complains of tremors stating she feels like it's anxiety.

## 2023-11-11 NOTE — ED Notes (Signed)
 Pt returned from MRI and now back on monitor.

## 2023-11-11 NOTE — ED Notes (Signed)
 Patient transported to MRI

## 2023-11-11 NOTE — Discharge Instructions (Addendum)
 Please continue to keep a log of your blood pressure at home.  Follow-up closely with your primary care doctor for a recheck.  Return to emergency department immediately for any new or worsening symptoms.

## 2023-11-11 NOTE — ED Provider Notes (Signed)
 [place] EMERGENCY DEPARTMENT AT [facility] Provider Note   CSN: [id number] Arrival date & time: 11/11/23  1126     History  Chief Complaint  Patient presents with   Near Syncope    Monique Day is a 88 y.o. female.  Patient is a 88 year old female who presents to Emergency Department with a chief complaint of lightheadedness and near syncopal event earlier today.  Patient notes that she was at choir practice when this began.  She does note that she has had some intermittent dizziness over the past 2 days.  Patient also notes that her blood pressures have been more elevated recently and she notes that she has seen her primary care doctor for this but has not been placed on any blood pressure medications.  Patient notes that she has had no chest pain, shortness of breath, palpitations, shortness of breath, abdominal pain, nausea, vomiting, diarrhea.  She denies any recent falls or blunt head trauma.  She denies any numbness, paresthesias or unilateral weakness.   Near Syncope       Home Medications Prior to Admission medications   Medication Sig Start Date End Date Taking? Authorizing Provider  Adalimumab (HUMIRA PEN) 40 MG/0.8ML PNKT Inject 40 mg into the skin every 14 (fourteen) days. INJECT 40 MG EVERY 14 DAYS AS DIRECTED 04/07/22   Marguerita Merles, Reuel Boom, MD  Calcium Carb-Cholecalciferol (CALCIUM 600 + D PO) Take 1 tablet by mouth daily.    [provider]  denosumab (PROLIA) 60 MG/ML SOLN injection Inject 60 mg into the skin every 6 (six) months. Administer in upper arm, thigh, or abdomen    [provider]  ELIQUIS 2.5 MG TABS tablet Take 1 tablet (2.5 mg total) by mouth 2 (two) times daily. 07/05/21   Rehman, Joline Maxcy, MD  FARXIGA 10 MG TABS tablet Take 10 mg by mouth daily. 06/26/20   [provider]  HUMIRA, 2 PEN, 40 MG/0.8ML AJKT pen INJECT 40MG  UNDER THE SKIN ONCE EVERY 14 DAYS. 10/26/23   Dolores Frame, MD   MAGNESIUM PO Take 500 mg by mouth daily.    [provider]  Multiple Vitamins-Minerals (PRESERVISION AREDS PO) Take 1 capsule by mouth daily.    [provider]  omeprazole (PRILOSEC) 20 MG capsule Take 1 capsule (20 mg total) by mouth daily. 10/01/23   Dolores Frame, MD  OVER THE COUNTER MEDICATION Otc Vit d 50 mg daily.    [provider]  polyethylene glycol (MIRALAX / GLYCOLAX) 17 g packet Take 17 g by mouth 2 (two) times daily. Patient taking differently: Take 17 g by mouth daily. 12/21/21   Osvaldo Shipper, MD  PRESCRIPTION MEDICATION Apply 1 application  topically daily as needed (irritation). [facility] Fluticasone 0.05 % and Ketoconazole 2% 1:1    [provider]      Allergies    Mercaptopurine and Sulfa antibiotics    Review of Systems   Review of Systems  Cardiovascular:  Positive for near-syncope.  Neurological:        Near syncope, lightheadedness  All other systems reviewed and are negative.   Physical Exam Updated Vital Signs BP (!) 165/70   Pulse 85   Temp 98.2 F (36.8 C) (Oral)   Resp 14   Ht 5\' 2"  (1.575 m)   Wt 78 kg   SpO2 94%   BMI 31.46 kg/m  Physical Exam Vitals and nursing note reviewed.  Constitutional:      Appearance:  Normal appearance.  HENT:     Head: Normocephalic and atraumatic.     Nose: Nose normal.     Mouth/Throat:     Mouth: Mucous membranes are moist.  Eyes:     Extraocular Movements: Extraocular movements intact.     Conjunctiva/sclera: Conjunctivae normal.     Pupils: Pupils are equal, round, and reactive to light.  Cardiovascular:     Rate and Rhythm: Normal rate and regular rhythm.     Pulses: Normal pulses.     Heart sounds: Normal heart sounds. No murmur heard.    No gallop.  Pulmonary:     Effort: Pulmonary effort is normal. No respiratory distress.     Breath sounds: Normal breath sounds. No stridor. No wheezing, rhonchi or rales.  Abdominal:      General: Abdomen is flat. Bowel sounds are normal. There is no distension.     Palpations: Abdomen is soft. There is no mass.     Tenderness: There is no abdominal tenderness. There is no guarding.     Hernia: No hernia is present.  Musculoskeletal:        General: Normal range of motion.     Cervical back: Normal range of motion and neck supple.     Right lower leg: No edema.     Left lower leg: No edema.  Skin:    General: Skin is warm and dry.     Findings: No bruising or rash.  Neurological:     General: No focal deficit present.     Mental Status: She is alert and oriented to person, place, and time. Mental status is at baseline.     Cranial Nerves: No cranial nerve deficit.     Sensory: No sensory deficit.     Motor: No weakness.     Coordination: Coordination normal.     Gait: Gait normal.  Psychiatric:        Mood and Affect: Mood normal.        Behavior: Behavior normal.        Thought Content: Thought content normal.        Judgment: Judgment normal.     ED Results / Procedures / Treatments   Labs (all labs ordered are listed, but only abnormal results are displayed) Labs Reviewed  COMPREHENSIVE METABOLIC PANEL - Abnormal; Notable for the following components:      Result Value   Glucose, Bld 110 (*)    Alkaline Phosphatase 35 (*)    GFR, Estimated 59 (*)    All other components within normal limits  CBC WITH DIFFERENTIAL/PLATELET - Abnormal; Notable for the following components:   Hemoglobin 15.8 (*)    HCT 46.9 (*)    All other components within normal limits  URINALYSIS, ROUTINE W REFLEX MICROSCOPIC - Abnormal; Notable for the following components:   Color, Urine STRAW (*)    Glucose, UA >=500 (*)    All other components within normal limits  LACTIC ACID, PLASMA  TSH  TROPONIN I (HIGH SENSITIVITY)  TROPONIN I (HIGH SENSITIVITY)    EKG EKG Interpretation Date/Time:  Wednesday November 11 2023 13:16:59 EDT Ventricular Rate:  90 PR Interval:  132 QRS  Duration:  124 QT Interval:  388 QTC Calculation: 475 R Axis:   84  Text Interpretation: Sinus rhythm Right bundle branch block No acute changes anterior TWI are new from 2013 Confirmed by Derwood Kaplan [phone]) on 11/11/2023 1:23:48 PM  Radiology MR Brain Wo Contrast (neuro protocol) Result Date: 11/11/2023  CLINICAL DATA:  Neuro deficit, concern for stroke. EXAM: MRI HEAD WITHOUT CONTRAST TECHNIQUE: Multiplanar, multiecho pulse sequences of the brain and surrounding structures were obtained without intravenous contrast. COMPARISON:  None Available. FINDINGS: Brain: No acute infarct. Mild FLAIR signal abnormality in the periventricular and subcortical white matter. Encephalomalacia and gliosis in the posterior left frontal lobe involving the precentral gyrus compatible with remote infarct. Associated small focus of susceptibility suggesting remote hemorrhage. Remote lacunar infarct in the right basal ganglia. Small remote infarct in the left cerebellum. No mass lesion or midline shift. Normal appearance of midline structures. The basilar cisterns are patent. No extra-axial fluid collections. Ventricles: Prominence of the lateral ventricles suggestive of underlying parenchymal volume loss. Vascular: Skull base flow voids are visualized. Skull and upper cervical spine: No focal abnormality. Sinuses/Orbits: Bilateral lens replacement. Mild mucosal thickening in the left anterior ethmoid air cells extending into the left frontal sinus. Other: Trace fluid in the left mastoid air cells. IMPRESSION: No acute intracranial abnormality. Remote infarct in the posterior left frontal lobe involving the precentral gyrus. Additional small remote infarcts in the right basal ganglia and left cerebellum. Mild chronic microvascular ischemic changes and mild to moderate parenchymal volume loss. Electronically Signed   By: Emily Filbert M.D.   On: 11/11/2023 15:01   DG Chest Port 1 View Result Date: 11/11/2023 CLINICAL DATA:   Weakness.  Near syncope. EXAM: PORTABLE CHEST 1 VIEW COMPARISON:  12/17/2021 FINDINGS: Right subclavian Port-A-Cath has been removed since the previous examination. Heart size is normal and stable. Atherosclerotic calcifications at the aortic arch. Coarse lung markings in the lower chest probably represent chronic changes. No evidence for acute airspace disease or consolidation. Surgical clips in the bilateral axillary regions. Stable focus of sclerosis in the left humeral head region. IMPRESSION: 1. No acute cardiopulmonary disease. 2. Chronic lung changes. Electronically Signed   By: Richarda Overlie M.D.   On: 11/11/2023 13:28    Procedures Procedures    Medications Ordered in ED Medications  sodium chloride 0.9 % bolus 500 mL (0 mLs Intravenous Stopped 11/11/23 1357)    ED Course/ Medical Decision Making/ A&P                                 Medical Decision Making Amount and/or Complexity of Data Reviewed Labs: ordered. Radiology: ordered.   This patient presents to the ED for concern of lightheadedness, near syncopal event differential diagnosis includes CVA, TIA, ACS, pulmonary embolus, hypertension, electrolyte derangement, sepsis    Additional history obtained:  Additional history obtained from granddaughter External records from outside source obtained and reviewed including none   Lab Tests:  I Ordered, and personally interpreted labs.  The pertinent results include: Normal electrolytes, kidney function, liver function, no anemia, normal urinalysis, normal serial troponins   Imaging Studies ordered:  I ordered imaging studies including MRI of the brain, chest x-ray I independently visualized and interpreted imaging which showed no acute intracranial changes, chronic small vessel disease, remote infarct, no acute cardiopulmonary process I agree with the radiologist interpretation   Problem List / ED Course:  Patient is doing well at this time and is stable for  discharge home.  Patient has had no further dizziness, lightheadedness in the emergency department.  Blood pressure has remained elevated and patient was directed to keep a log of her blood pressure at home and to follow-up closely with her primary care doctor.  Lab work has  been overall unremarkable.  EKG had no acute ischemic changes or STEMI and troponins were stable.  TSH was within normal limits.  MRI of the brain demonstrated no acute ischemic changes.  Do not suspect CVA or TIA at this point.  Chest x-ray demonstrated no signs of acute cardiopulmonary process.  Do not suspect that admission is warranted at this time.  Patient was fully evaluated by attending physician who is in agreement to plan for discharge home.  Patient and family voiced understanding and had no additional questions.   Social Determinants of Health:  None           Final Clinical Impression(s) / ED Diagnoses Final diagnoses:  Near syncope    Rx / DC Orders ED Discharge Orders     None         Kathlen Mody 11/11/23 1531    Derwood Kaplan, MD 11/12/23 1021

## 2023-11-13 DIAGNOSIS — I10 Essential (primary) hypertension: Secondary | ICD-10-CM | POA: Diagnosis not present

## 2023-11-30 DIAGNOSIS — R609 Edema, unspecified: Secondary | ICD-10-CM | POA: Diagnosis not present

## 2023-11-30 DIAGNOSIS — I10 Essential (primary) hypertension: Secondary | ICD-10-CM | POA: Diagnosis not present

## 2023-11-30 DIAGNOSIS — M818 Other osteoporosis without current pathological fracture: Secondary | ICD-10-CM | POA: Diagnosis not present

## 2023-11-30 DIAGNOSIS — I82401 Acute embolism and thrombosis of unspecified deep veins of right lower extremity: Secondary | ICD-10-CM | POA: Diagnosis not present

## 2023-11-30 DIAGNOSIS — B372 Candidiasis of skin and nail: Secondary | ICD-10-CM | POA: Diagnosis not present

## 2023-11-30 DIAGNOSIS — K51919 Ulcerative colitis, unspecified with unspecified complications: Secondary | ICD-10-CM | POA: Diagnosis not present

## 2024-04-02 LAB — LAB REPORT - SCANNED: EGFR: 54

## 2024-04-11 ENCOUNTER — Ambulatory Visit (INDEPENDENT_AMBULATORY_CARE_PROVIDER_SITE_OTHER): Admitting: Gastroenterology

## 2024-04-11 ENCOUNTER — Encounter (INDEPENDENT_AMBULATORY_CARE_PROVIDER_SITE_OTHER): Payer: Self-pay | Admitting: Gastroenterology

## 2024-04-11 VITALS — BP 136/76 | HR 76 | Temp 97.5°F | Ht 62.5 in | Wt 171.7 lb

## 2024-04-11 DIAGNOSIS — Z111 Encounter for screening for respiratory tuberculosis: Secondary | ICD-10-CM

## 2024-04-11 DIAGNOSIS — K5100 Ulcerative (chronic) pancolitis without complications: Secondary | ICD-10-CM | POA: Diagnosis not present

## 2024-04-11 DIAGNOSIS — Z1159 Encounter for screening for other viral diseases: Secondary | ICD-10-CM

## 2024-04-11 NOTE — Patient Instructions (Addendum)
 Continue Humira  every 2 weeks We will request labs from PCP Perform blood workup If recurrent heartburn, may try famotidine  Obtain updated flu, pneumonia and RSV vaccination

## 2024-04-11 NOTE — Progress Notes (Unsigned)
 Toribio Fortune, M.D. Gastroenterology & Hepatology [facility] [address] [place], [place] [address]  Primary Care Physician: Renato Dorothey HERO, NP [address] [place] [address]  I will communicate my assessment and recommendations to the referring MD via EMR.  Problems: Ulcerative colitis [facility] GERD   History of Present Illness: Monique Day is a 88 y.o. female with past medical history of  DVT, ulcerative pancolitis, seizures, breast cancer status post mastectomy, arthritis, who comes for follow up ulcerative colitis.  The patient was last seen on 10/01/2023. At that time, the patient the patient was started on omeprazole  20 mg daily for management of GERD.  She was continued Humira  every 2 weeks and advised to follow-up with dermatologist.  She is feeling well and denies nay complaints. Takes Miralax  once a day to have 1 BM per day. The patient denies having any nausea, vomiting, fever, chills, hematochezia, melena, hematemesis, abdominal distention, abdominal pain, diarrhea, jaundice, pruritus or weight loss.  States that she had increased BP while on omeprazole , so had to stop it. This led to adequate control of her BP. Not having any more heartburn or dysphagia episodes while off PPI.  Previous medications: Remicade    Last flu shot: 2024 Last pneumonia shot: yes, had the least potent one but not interested to have other shots Last zoster vaccine: 2021, Shingrix  RSV vaccine: never Last DEXA scan: 03/02/2023 -  T score -0.9 normal  Most recent TB testing and hepatitis B surface antigen were performed on 04/13/2023 Last dermatology evaluation: 2025  Most recent labs ***  Last Colonoscopy: 07/04/2021 - A single non-bleeding colonic angiodysplastic lesion. - Scar in the entire examined colon. - One 6 mm polyp in the distal rectum, removed with a cold snare. Resected and retrieved  Past Medical History: Past Medical  History:  Diagnosis Date   Adult idiopathic generalized osteoporosis    Arthritis    minor (08/18/2018)   Breast cancer, left breast (HCC) 1998   lumpectomy   Breast cancer, right breast (HCC) 2009   lumpectomy; mastectomy   DVT (deep venous thrombosis) (HCC) early 2000's   RLE   DVT (deep venous thrombosis) (HCC)    Hemorrhoids, external    History of blood transfusion    related to ulcerative colitis   Lower abdominal pain    Occult blood in stools    Peripheral edema    Seizures (HCC)    one very slight seizure with the stroke; somewhere between 2005-2009   Stroke (HCC) 2005-2009   very light; ; denies residual on 08/19/2018   UC (ulcerative colitis) (HCC) dx'd 1957   Warfarin-induced coagulopathy (HCC) 06/09/2012    Past Surgical History: Past Surgical History:  Procedure Laterality Date   APPENDECTOMY     BIOPSY  04/14/2019   Procedure: BIOPSY;  Surgeon: Golda Claudis PENNER, MD;  [facility];  Service: Endoscopy;;   bone density  12/11/2010   BREAST BIOPSY Left 1998; 2019 X 2   BREAST BIOPSY Right 2009   BREAST LUMPECTOMY Left 1998   CATARACT EXTRACTION W/ INTRAOCULAR LENS  IMPLANT, BILATERAL Bilateral    COLONOSCOPY  02/21/2011   COLONOSCOPY  10/06/2008   COLONOSCOPY  12/27/2001   COLONOSCOPY  05/09/2005   COLONOSCOPY N/A 07/02/2016   Procedure: COLONOSCOPY;  Surgeon: Claudis PENNER Golda, MD;  [facility];  Service: Endoscopy;  Laterality: N/A;  1055   COLONOSCOPY N/A 07/04/2021   Procedure: COLONOSCOPY;  Surgeon:  Golda Claudis PENNER, MD;  [facility];  Service: Endoscopy;  Laterality: N/A;  7:30   EXPLORATORY LAPAROTOMY  1958   I was having rectal bleeding   FLEXIBLE SIGMOIDOSCOPY N/A 04/14/2019   Procedure: FLEXIBLE SIGMOIDOSCOPY;  Surgeon: Golda Claudis PENNER, MD;  [facility];  Service: Endoscopy;  Laterality: N/A;  1:00   FRACTURE SURGERY     INTRAMEDULLARY (IM) NAIL INTERTROCHANTERIC Left 12/19/2021   Procedure:  INTRAMEDULLARY (IM) NAIL INTERTROCHANTRIC;  Surgeon: Margrette Taft BRAVO, MD;  [facility]: [facility];  Service: Orthopedics;  Laterality: Left;   MASTECTOMY Right 2009   MASTECTOMY COMPLETE / SIMPLE Left 08/19/2018   POLYPECTOMY  07/04/2021   Procedure: POLYPECTOMY;  Surgeon: Golda Claudis PENNER, MD;  [facility];  Service: Endoscopy;;   PORT-A-CATH REMOVAL N/A 04/08/2023   Procedure: MINOR REMOVAL PORT-A-CATH;  Surgeon: Mavis Anes, MD;  [facility];  Service: General;  Laterality: N/A;   PORTACATH PLACEMENT Right 10/15/2018   Procedure: INSERTION PORT-A-CATH RIGHT SUBCLAVIN;  Surgeon: Mavis Anes, MD;  [facility]: [facility];  Service: General;  Laterality: Right;  pt knows to arrive at 7:30   SIMPLE MASTECTOMY WITH AXILLARY SENTINEL NODE BIOPSY Left 08/19/2018   Procedure: LEFT SIMPLE MASTECTOMY;  Surgeon: Vanderbilt Ned, MD;  [facility];  Service: General;  Laterality: Left;   TONSILLECTOMY     VENA CAVA FILTER PLACEMENT Right    WRIST FRACTURE SURGERY Right     Family History: Family History  Problem Relation Age of Onset   Prostate cancer Father    Colon cancer Neg Hx     Social History: Social History   Tobacco Use  Smoking Status Former   Current packs/day: 0.00   Average packs/day: 2.0 packs/day for 40.0 years (80.0 ttl pk-yrs)   Types: Cigarettes   Start date: 05/05/1961   Quit date: 05/05/2001   Years since quitting: 22.9   Passive exposure: Never  Smokeless Tobacco Never   Social History   Substance and Sexual Activity  Alcohol Use Yes   Comment: 08/18/2018 couple drinks/year   Social History   Substance and Sexual Activity  Drug Use Never    Allergies: Allergies  Allergen Reactions   Mercaptopurine Other (See Comments)    Dropped WBC   Sulfa Antibiotics Other (See Comments)    Extreme Weakness    Medications: Current Outpatient Medications  Medication Sig Dispense Refill   Adalimumab  (HUMIRA  PEN) 40 MG/0.8ML PNKT Inject 40 mg into  the skin every 14 (fourteen) days. INJECT 40 MG EVERY 14 DAYS AS DIRECTED 2 each 11   Calcium  Carb-Cholecalciferol  (CALCIUM  600 + D PO) Take 1 tablet by mouth daily.     denosumab  (PROLIA ) 60 MG/ML SOLN injection Inject 60 mg into the skin every 6 (six) months. Administer in upper arm, thigh, or abdomen     ELIQUIS  2.5 MG TABS tablet Take 1 tablet (2.5 mg total) by mouth 2 (two) times daily. 60 tablet    FARXIGA  10 MG TABS tablet Take 10 mg by mouth daily.     MAGNESIUM  PO Take 500 mg by mouth daily.     Multiple Vitamins-Minerals (PRESERVISION AREDS PO) Take 1 capsule by mouth daily.     OVER THE COUNTER MEDICATION Otc Vit d 50 mg daily.     polyethylene glycol (MIRALAX  / GLYCOLAX ) 17 g packet Take 17 g by mouth 2 (two) times daily. (Patient taking differently: Take 17 g by mouth daily.) 14 each 0   PRESCRIPTION MEDICATION Apply  1 application  topically daily as needed (irritation). [facility] Fluticasone 0.05 % and Ketoconazole 2% 1:1     omeprazole  (PRILOSEC) 20 MG capsule Take 1 capsule (20 mg total) by mouth daily. (Patient not taking: Reported on 04/11/2024) 90 capsule 3   No current facility-administered medications for this visit.   Facility-Administered Medications Ordered in Other Visits  Medication Dose Route Frequency Provider Last Rate Last Admin   sodium chloride  flush (NS) 0.9 % injection 10 mL  10 mL Intravenous PRN Rogers Hai, MD   10 mL at 07/24/21 1429    Review of Systems: GENERAL: negative for malaise, night sweats HEENT: No changes in hearing or vision, no nose bleeds or other nasal problems. NECK: Negative for lumps, goiter, pain and significant neck swelling RESPIRATORY: Negative for cough, wheezing CARDIOVASCULAR: Negative for chest pain, leg swelling, palpitations, orthopnea GI: SEE HPI MUSCULOSKELETAL: Negative for joint pain or swelling, back pain, and muscle pain. SKIN: Negative for lesions, rash PSYCH: Negative for sleep  disturbance, mood disorder and recent psychosocial stressors. HEMATOLOGY Negative for prolonged bleeding, bruising easily, and swollen nodes. ENDOCRINE: Negative for cold or heat intolerance, polyuria, polydipsia and goiter. NEURO: negative for tremor, gait imbalance, syncope and seizures. The remainder of the review of systems is noncontributory.   Physical Exam: BP 136/76 (BP Location: Right Arm, Patient Position: Sitting, Cuff Size: Large)   Pulse 76   Temp (!) 97.5 F (36.4 C) (Temporal)   Ht 5' 2.5 (1.588 m)   Wt 171 lb 11.2 oz (77.9 kg)   BMI 30.90 kg/m  GENERAL: The patient is AO x3, in no acute distress. HEENT: Head is normocephalic and atraumatic. EOMI are intact. Mouth is well hydrated and without lesions. NECK: Supple. No masses LUNGS: Clear to auscultation. No presence of rhonchi/wheezing/rales. Adequate chest expansion HEART: RRR, normal s1 and s2. ABDOMEN: Soft, nontender, no guarding, no peritoneal signs, and nondistended. BS +. No masses. RECTAL EXAM: no external lesions, normal tone, no masses, brown stool without blood.*** Chaperone: EXTREMITIES: Without any cyanosis, clubbing, rash, lesions or edema. NEUROLOGIC: AOx3, no focal motor deficit. SKIN: no jaundice, no rashes  Imaging/Labs: as above  I personally reviewed and interpreted the available labs, imaging and endoscopic files.  Impression and Plan: Dmiya Malphrus is a 88 y.o. female coming for follow up of ***   All questions were answered.      Toribio Fortune, MD Gastroenterology and Hepatology [facility]

## 2024-04-13 LAB — QUANTIFERON-TB GOLD PLUS
Mitogen-NIL: 7.79 [IU]/mL
NIL: 0.11 [IU]/mL
QuantiFERON-TB Gold Plus: NEGATIVE
TB1-NIL: <0 [IU]/mL
TB2-NIL: <0 [IU]/mL

## 2024-04-13 LAB — HEPATITIS B SURFACE ANTIGEN: Hepatitis B Surface Ag: NONREACTIVE

## 2024-06-01 ENCOUNTER — Encounter (INDEPENDENT_AMBULATORY_CARE_PROVIDER_SITE_OTHER): Payer: Self-pay | Admitting: Gastroenterology

## 2024-09-06 ENCOUNTER — Telehealth: Payer: Self-pay | Admitting: Gastroenterology

## 2024-09-06 NOTE — Telephone Encounter (Signed)
 Patient left a message that she needed to schedule a 6 month follow up.  [phone]

## 2024-09-08 ENCOUNTER — Encounter (INDEPENDENT_AMBULATORY_CARE_PROVIDER_SITE_OTHER): Payer: Self-pay | Admitting: Gastroenterology

## 2024-09-08 ENCOUNTER — Ambulatory Visit (INDEPENDENT_AMBULATORY_CARE_PROVIDER_SITE_OTHER): Admitting: Gastroenterology

## 2024-09-08 VITALS — BP 126/67 | HR 90 | Temp 97.1°F | Ht 62.5 in | Wt 173.4 lb

## 2024-09-08 DIAGNOSIS — K5190 Ulcerative colitis, unspecified, without complications: Secondary | ICD-10-CM

## 2024-09-08 DIAGNOSIS — K5100 Ulcerative (chronic) pancolitis without complications: Secondary | ICD-10-CM

## 2024-09-08 MED ORDER — HUMIRA (2 SYRINGE) 40 MG/0.8ML ~~LOC~~ PSKT: 40.0000 mg | PREFILLED_SYRINGE | SUBCUTANEOUS | 12 refills | Status: AC

## 2024-09-08 NOTE — Progress Notes (Signed)
 "  Referring Provider: Renato Dorothey HERO, NP Primary Care Physician:  Renato Dorothey HERO, NP Primary GI Physician: Previously Dr. Eartha (Dr. Cinderella)   Chief Complaint  Patient presents with   Follow-up    Patient here today for a follow up on Ulcerative pancolitis without complication. Patient denies any current gi related issues. She is currently taking Humira  40 mg/0.8 ml every 14 days.    HPI:   Monique Day is a 89 y.o. female with past medical history of DVT, ulcerative pancolitis, seizures, breast cancer status post mastectomy, arthritis   Patient presenting today for:  Follow up UC  Last seen August, by Dr. Eartha, at that time, doing well, no complaints, taking miralax  once a day, 1 Bm per day, had to stop omeprazole  as BP was elevated. No issues with GERD at that time.   Recommended to continue Humira  every 2 weeks, request labs from PCP, check Hep B/TB, try famotidine  if recurrent heartburn, obtain flu, pna and RSV vaccines  Aug 2025: Hep BsAg nonreactive TB quant negative   CBC/CMP done at PCP in August WNL  Present:  Usually has 1 stool per day, takes miralax  1 time per day and stools are softer and easier to pass. No rectal bleeding or melena. She has no abdominal pain. Appetite is good. No nausea or vomiting. She has upcoming labs in February with the cancer center and has upcoming visit with PCP. She inquires about needing to come every 6 months as she is doing well.  No red flag symptoms. Patient denies melena, hematochezia, nausea, vomiting, diarrhea, constipation, dysphagia, odyonophagia, early satiety or weight loss.    Previous medications: Remicade    Last flu shot: 2025 Last pneumonia shot: late 2025  Last zoster vaccine: 2021, Shingrix RSV vaccine:late 2025  Last DEXA scan: 03/02/2023 -  T score -0.9 normal  Most recent TB testing and hepatitis B surface antigen were performed on 03/2024  Last dermatology evaluation: summer 2025    Last  Colonoscopy: 07/04/2021 - A single non-bleeding colonic angiodysplastic lesion. - Scar in the entire examined colon. - One 6 mm polyp in the distal rectum, removed with a cold snare. Resected and retrieved   Filed Weights   09/08/24 1403  Weight: 173 lb 6.4 oz (78.7 kg)     Past Medical History:  Diagnosis Date   Adult idiopathic generalized osteoporosis    Arthritis    minor (08/18/2018)   Breast cancer, left breast (HCC) 1998   lumpectomy   Breast cancer, right breast (HCC) 2009   lumpectomy; mastectomy   DVT (deep venous thrombosis) (HCC) early 2000's   RLE   DVT (deep venous thrombosis) (HCC)    Hemorrhoids, external    History of blood transfusion    related to ulcerative colitis   Lower abdominal pain    Occult blood in stools    Peripheral edema    Seizures (HCC)    one very slight seizure with the stroke; somewhere between 2005-2009   Stroke [facility]) 2005-2009   very light; ; denies residual on 08/19/2018   UC (ulcerative colitis) (HCC) dx'd 1957   Warfarin-induced coagulopathy 06/09/2012    Past Surgical History:  Procedure Laterality Date   APPENDECTOMY     BIOPSY  04/14/2019   Procedure: BIOPSY;  Surgeon: Golda Claudis PENNER, MD;  [facility];  Service: Endoscopy;;   bone density  12/11/2010   BREAST BIOPSY Left 1998; 2019 X 2   BREAST BIOPSY Right 2009  BREAST LUMPECTOMY Left 1998   CATARACT EXTRACTION W/ INTRAOCULAR LENS  IMPLANT, BILATERAL Bilateral    COLONOSCOPY  02/21/2011   COLONOSCOPY  10/06/2008   COLONOSCOPY  12/27/2001   COLONOSCOPY  05/09/2005   COLONOSCOPY N/A 07/02/2016   Procedure: COLONOSCOPY;  Surgeon: Claudis RAYMOND Rivet, MD;  [facility];  Service: Endoscopy;  Laterality: N/A;  1055   COLONOSCOPY N/A 07/04/2021   Procedure: COLONOSCOPY;  Surgeon: Rivet Claudis RAYMOND, MD;  [facility];  Service: Endoscopy;  Laterality: N/A;  7:30   EXPLORATORY LAPAROTOMY  1958   I was having rectal bleeding   FLEXIBLE  SIGMOIDOSCOPY N/A 04/14/2019   Procedure: FLEXIBLE SIGMOIDOSCOPY;  Surgeon: Rivet Claudis RAYMOND, MD;  [facility];  Service: Endoscopy;  Laterality: N/A;  1:00   FRACTURE SURGERY     INTRAMEDULLARY (IM) NAIL INTERTROCHANTERIC Left 12/19/2021   Procedure: INTRAMEDULLARY (IM) NAIL INTERTROCHANTRIC;  Surgeon: Margrette Taft BRAVO, MD;  [facility];  Service: Orthopedics;  Laterality: Left;   MASTECTOMY Right 2009   MASTECTOMY COMPLETE / SIMPLE Left 08/19/2018   POLYPECTOMY  07/04/2021   Procedure: POLYPECTOMY;  Surgeon: Rivet Claudis RAYMOND, MD;  [facility];  Service: Endoscopy;;   PORT-A-CATH REMOVAL N/A 04/08/2023   Procedure: MINOR REMOVAL PORT-A-CATH;  Surgeon: Mavis Anes, MD;  [facility];  Service: General;  Laterality: N/A;   PORTACATH PLACEMENT Right 10/15/2018   Procedure: INSERTION PORT-A-CATH RIGHT SUBCLAVIN;  Surgeon: Mavis Anes, MD;  [facility];  Service: General;  Laterality: Right;  pt knows to arrive at 7:30   SIMPLE MASTECTOMY WITH AXILLARY SENTINEL NODE BIOPSY Left 08/19/2018   Procedure: LEFT SIMPLE MASTECTOMY;  Surgeon: Vanderbilt Ned, MD;  [facility];  Service: General;  Laterality: Left;   TONSILLECTOMY     VENA CAVA FILTER PLACEMENT Right    WRIST FRACTURE SURGERY Right     Current Outpatient Medications  Medication Sig Dispense Refill   Adalimumab  (HUMIRA  PEN) 40 MG/0.8ML PNKT Inject 40 mg into the skin every 14 (fourteen) days. INJECT 40 MG EVERY 14 DAYS AS DIRECTED 2 each 11   Calcium  Carb-Cholecalciferol  (CALCIUM  600 + D PO) Take 1 tablet by mouth daily.     denosumab  (PROLIA ) 60 MG/ML SOLN injection Inject 60 mg into the skin every 6 (six) months. Administer in upper arm, thigh, or abdomen     ELIQUIS  2.5 MG TABS tablet Take 1 tablet (2.5 mg total) by mouth 2 (two) times daily. 60 tablet    FARXIGA  10 MG TABS tablet Take 10 mg by mouth daily.     MAGNESIUM  PO Take 500 mg by mouth daily.     Multiple Vitamins-Minerals  (PRESERVISION AREDS PO) Take 1 capsule by mouth daily.     OVER THE COUNTER MEDICATION Otc Vit d 50 mg daily.     polyethylene glycol (MIRALAX  / GLYCOLAX ) 17 g packet Take 17 g by mouth 2 (two) times daily. (Patient taking differently: Take 17 g by mouth daily.) 14 each 0   PRESCRIPTION MEDICATION Apply 1 application  topically daily as needed (irritation). [facility] Fluticasone 0.05 % and Ketoconazole 2% 1:1     No current facility-administered medications for this visit.   Facility-Administered Medications Ordered in Other Visits  Medication Dose Route Frequency Provider Last Rate Last Admin   sodium chloride  flush (NS) 0.9 % injection 10 mL  10 mL Intravenous PRN Rogers Hai, MD   10 mL at 07/24/21 1429    Allergies  as of 09/08/2024 - Review Complete 09/08/2024  Allergen Reaction Noted   Mercaptopurine Other (See Comments) 07/26/2012   Sulfa antibiotics Other (See Comments) 03/27/2011    Social History   Socioeconomic History   Marital status: Widowed    Spouse name: Not on file   Number of children: 0   Years of education: Not on file   Highest education level: Not on file  Occupational History   Occupation: Retail buyer    Employer: [employer]  Tobacco Use   Smoking status: Former    Current packs/day: 0.00    Average packs/day: 2.0 packs/day for 40.0 years (80.0 ttl pk-yrs)    Types: Cigarettes    Start date: 05/05/1961    Quit date: 05/05/2001    Years since quitting: 23.3    Passive exposure: Never   Smokeless tobacco: Never  Vaping Use   Vaping status: Never Used  Substance and Sexual Activity   Alcohol use: Yes    Comment: 08/18/2018 couple drinks/year   Drug use: Never   Sexual activity: Not Currently  Other Topics Concern   Not on file  Social History Narrative   Not on file   Social Drivers of Health   Tobacco Use: Medium Risk (09/08/2024)   Patient History    Smoking Tobacco Use: Former    Smokeless  Tobacco Use: Never    Passive Exposure: Never  Programmer, Applications: Not on [employer] Insecurity: Not on file  Transportation Needs: Not on file  Physical Activity: Not on file  Stress: Not on file  Social Connections: Not on file  Depression (PHQ2-9): Not on file  Alcohol Screen: Not on file  Housing: Not on file  Utilities: Not on file  Health Literacy: Not on file    Review of systems General: negative for malaise, night sweats, fever, chills, weight loss Neck: Negative for lumps, goiter, pain and significant neck swelling Resp: Negative for cough, wheezing, dyspnea at rest CV: Negative for chest pain, leg swelling, palpitations, orthopnea GI: denies melena, hematochezia, nausea, vomiting, diarrhea, constipation, dysphagia, odyonophagia, early satiety or unintentional weight loss.  MSK: Negative for joint pain or swelling, back pain, and muscle pain. Derm: Negative for itching or rash Psych: Denies depression, anxiety, memory loss, confusion. No homicidal or suicidal ideation.  Heme: Negative for prolonged bleeding, bruising easily, and swollen nodes. Endocrine: Negative for cold or heat intolerance, polyuria, polydipsia and goiter. Neuro: negative for tremor, gait imbalance, syncope and seizures. The remainder of the review of systems is noncontributory.  Physical Exam: BP 126/67 (BP Location: Left Arm, Patient Position: Sitting, Cuff Size: Large)   Pulse 90   Temp (!) 97.1 F (36.2 C) (Temporal)   Ht 5' 2.5 (1.588 m)   Wt 173 lb 6.4 oz (78.7 kg)   BMI 31.21 kg/m  General:   Alert and oriented. No distress noted. Pleasant and cooperative.  Head:  Normocephalic and atraumatic. Eyes:  Conjuctiva clear without scleral icterus. Mouth:  Oral mucosa pink and moist. Good dentition. No lesions. Heart: Normal rate and rhythm, s1 and s2 heart sounds present.  Lungs: Clear lung sounds in all lobes. Respirations equal and unlabored. Abdomen:  +BS, soft, non-tender and  non-distended. No rebound or guarding. No HSM or masses noted. Derm: No palmar erythema or jaundice Msk:  Symmetrical without gross deformities. Normal posture. Extremities:  Without edema. Neurologic:  Alert and  oriented x4 Psych:  Alert and cooperative. Normal mood and affect.  Invalid input(s): 6 MONTHS  ASSESSMENT: Monique Day is a 89 y.o. female presenting today for follow up of UC  Doing well on Humira , she has been maintained on this since 2015 with no active GI symptoms. Takes miralax  daily, having 1-2 stools per day that are soft but formed. No rectal bleeding or melena. She is up to date on preventative vaccines and Hep B and TB testing, she had routine labs in August and has upcoming visit with PCP in February for wellness check. For now we will continue with current dose of Humira . We discussed importance of 6 month follow up given she is on Humira  which she is agreeable to continue. She also inquired about if she had to switch from brand humira  to generic due to insurance issues which I discussed with her was becoming more common, if insurance requires she switch to generic they should make her aware which biosimilars they prefer and we can work on transitioning her to one of those as needed.    PLAN:  -continue humira  every 2 weeks  -pt to make me aware of any new or worsening GI issues  All questions were answered, patient verbalized understanding and is in agreement with plan as outlined above.   Follow Up: 6 months   Prescilla Monger L. Alphus Zeck, MSN, APRN, AGNP-C Adult-Gerontology Nurse Practitioner [facility]  "

## 2024-09-08 NOTE — Patient Instructions (Signed)
 We will continue humira  every 14 days as you are doing You are up to date on labs Let me know if you have any new or worsening GI issues  Follow up 6 months  It was a pleasure to see you today. I want to create trusting relationships with patients and provide genuine, compassionate, and quality care. I truly value your feedback! please be on the lookout for a survey regarding your visit with me today. I appreciate your input about our visit and your time in completing this!    Monique Majeed L. Monique North, MSN, APRN, AGNP-C Adult-Gerontology Nurse Practitioner [facility] Gastroenterology at [facility]

## 2024-09-16 ENCOUNTER — Inpatient Hospital Stay: Attending: Physician Assistant

## 2024-09-16 DIAGNOSIS — C50212 Malignant neoplasm of upper-inner quadrant of left female breast: Secondary | ICD-10-CM

## 2024-09-16 LAB — COMPREHENSIVE METABOLIC PANEL WITH GFR
ALT: 11 U/L (ref 0–44)
AST: 20 U/L (ref 15–41)
Albumin: 4.2 g/dL (ref 3.5–5.0)
Alkaline Phosphatase: 39 U/L (ref 38–126)
Anion gap: 13 (ref 5–15)
BUN: 20 mg/dL (ref 8–23)
CO2: 23 mmol/L (ref 22–32)
Calcium: 10 mg/dL (ref 8.9–10.3)
Chloride: 105 mmol/L (ref 98–111)
Creatinine, Ser: 0.9 mg/dL (ref 0.44–1.00)
GFR, Estimated: >60 mL/min
Glucose, Bld: 98 mg/dL (ref 70–99)
Potassium: 4.6 mmol/L (ref 3.5–5.1)
Sodium: 141 mmol/L (ref 135–145)
Total Bilirubin: 0.5 mg/dL (ref 0.0–1.2)
Total Protein: 8.1 g/dL (ref 6.5–8.1)

## 2024-09-16 LAB — CBC WITH DIFFERENTIAL/PLATELET
Abs Immature Granulocytes: 0.02 10*3/uL (ref 0.00–0.07)
Basophils Absolute: 0 10*3/uL (ref 0.0–0.1)
Basophils Relative: 1 %
Eosinophils Absolute: 0.2 10*3/uL (ref 0.0–0.5)
Eosinophils Relative: 2 %
HCT: 49.7 % — ABNORMAL HIGH (ref 36.0–46.0)
Hemoglobin: 16.1 g/dL — ABNORMAL HIGH (ref 12.0–15.0)
Immature Granulocytes: 0 %
Lymphocytes Relative: 30 %
Lymphs Abs: 2.1 10*3/uL (ref 0.7–4.0)
MCH: 31.3 pg (ref 26.0–34.0)
MCHC: 32.4 g/dL (ref 30.0–36.0)
MCV: 96.7 fL (ref 80.0–100.0)
Monocytes Absolute: 0.7 10*3/uL (ref 0.1–1.0)
Monocytes Relative: 10 %
Neutro Abs: 4.1 10*3/uL (ref 1.7–7.7)
Neutrophils Relative %: 57 %
Platelets: 215 10*3/uL (ref 150–400)
RBC: 5.14 MIL/uL — ABNORMAL HIGH (ref 3.87–5.11)
RDW: 12.9 % (ref 11.5–15.5)
WBC: 7.1 10*3/uL (ref 4.0–10.5)
nRBC: 0 % (ref 0.0–0.2)

## 2024-09-16 LAB — VITAMIN D 25 HYDROXY (VIT D DEFICIENCY, FRACTURES): Vit D, 25-Hydroxy: 48.1 ng/mL (ref 30–100)

## 2024-09-18 ENCOUNTER — Encounter: Payer: Self-pay | Admitting: *Deleted

## 2024-09-19 ENCOUNTER — Inpatient Hospital Stay: Payer: Medicare PPO

## 2024-09-26 ENCOUNTER — Inpatient Hospital Stay: Payer: Medicare PPO | Admitting: Physician Assistant
